# Patient Record
Sex: Male | Born: 1935 | Race: White | Hispanic: No | Marital: Married | Smoking: Former smoker
Health system: Southern US, Community
[De-identification: ages and names within clinical notes are randomized; demographics above are authoritative.]

## PROBLEM LIST (undated history)

## (undated) DIAGNOSIS — E785 Hyperlipidemia, unspecified: Secondary | ICD-10-CM

## (undated) DIAGNOSIS — J690 Pneumonitis due to inhalation of food and vomit: Secondary | ICD-10-CM

## (undated) DIAGNOSIS — S069X9A Unspecified intracranial injury with loss of consciousness of unspecified duration, initial encounter: Secondary | ICD-10-CM

## (undated) DIAGNOSIS — K56609 Unspecified intestinal obstruction, unspecified as to partial versus complete obstruction: Secondary | ICD-10-CM

## (undated) DIAGNOSIS — J189 Pneumonia, unspecified organism: Secondary | ICD-10-CM

## (undated) DIAGNOSIS — K219 Gastro-esophageal reflux disease without esophagitis: Secondary | ICD-10-CM

## (undated) DIAGNOSIS — R413 Other amnesia: Secondary | ICD-10-CM

## (undated) DIAGNOSIS — M159 Polyosteoarthritis, unspecified: Secondary | ICD-10-CM

## (undated) DIAGNOSIS — M1990 Unspecified osteoarthritis, unspecified site: Secondary | ICD-10-CM

## (undated) DIAGNOSIS — M542 Cervicalgia: Secondary | ICD-10-CM

## (undated) DIAGNOSIS — G8929 Other chronic pain: Secondary | ICD-10-CM

## (undated) DIAGNOSIS — G919 Hydrocephalus, unspecified: Secondary | ICD-10-CM

## (undated) DIAGNOSIS — Q046 Congenital cerebral cysts: Secondary | ICD-10-CM

## (undated) DIAGNOSIS — I10 Essential (primary) hypertension: Secondary | ICD-10-CM

## (undated) DIAGNOSIS — I639 Cerebral infarction, unspecified: Secondary | ICD-10-CM

## (undated) DIAGNOSIS — C4490 Unspecified malignant neoplasm of skin, unspecified: Secondary | ICD-10-CM

## (undated) DIAGNOSIS — C61 Malignant neoplasm of prostate: Secondary | ICD-10-CM

## (undated) HISTORY — DX: Essential (primary) hypertension: I10

## (undated) HISTORY — DX: Hyperlipidemia, unspecified: E78.5

## (undated) HISTORY — PX: TRANSURETHRAL RESECTION OF PROSTATE: SHX73

## (undated) HISTORY — DX: Cerebral infarction, unspecified: I63.9

## (undated) HISTORY — DX: Polyosteoarthritis, unspecified: M15.9

---

## 1989-03-04 DIAGNOSIS — S069X9A Unspecified intracranial injury with loss of consciousness of unspecified duration, initial encounter: Secondary | ICD-10-CM

## 1989-03-04 DIAGNOSIS — S069XAA Unspecified intracranial injury with loss of consciousness status unknown, initial encounter: Secondary | ICD-10-CM

## 1989-03-04 HISTORY — DX: Unspecified intracranial injury with loss of consciousness of unspecified duration, initial encounter: S06.9X9A

## 1989-03-04 HISTORY — PX: BRAIN SURGERY: SHX531

## 1989-03-04 HISTORY — DX: Unspecified intracranial injury with loss of consciousness status unknown, initial encounter: S06.9XAA

## 1996-05-04 HISTORY — PX: PROSTATECTOMY: SHX69

## 1997-05-04 HISTORY — PX: INGUINAL HERNIA REPAIR: SUR1180

## 1997-05-04 HISTORY — PX: ABDOMINAL HERNIA REPAIR: SHX539

## 2004-11-01 ENCOUNTER — Emergency Department (HOSPITAL_COMMUNITY): Admission: EM | Admit: 2004-11-01 | Discharge: 2004-11-01 | Payer: Self-pay | Admitting: Emergency Medicine

## 2004-12-19 ENCOUNTER — Inpatient Hospital Stay (HOSPITAL_COMMUNITY): Admission: EM | Admit: 2004-12-19 | Discharge: 2004-12-23 | Payer: Self-pay | Admitting: Emergency Medicine

## 2005-01-29 ENCOUNTER — Encounter: Admission: RE | Admit: 2005-01-29 | Discharge: 2005-01-29 | Payer: Self-pay | Admitting: Gastroenterology

## 2006-01-03 ENCOUNTER — Emergency Department (HOSPITAL_COMMUNITY): Admission: EM | Admit: 2006-01-03 | Discharge: 2006-01-03 | Payer: Self-pay | Admitting: Emergency Medicine

## 2006-06-05 ENCOUNTER — Inpatient Hospital Stay (HOSPITAL_COMMUNITY): Admission: EM | Admit: 2006-06-05 | Discharge: 2006-06-06 | Payer: Self-pay | Admitting: Emergency Medicine

## 2006-11-29 ENCOUNTER — Ambulatory Visit (HOSPITAL_COMMUNITY): Admission: RE | Admit: 2006-11-29 | Discharge: 2006-11-29 | Payer: Self-pay | Admitting: Gastroenterology

## 2006-11-29 ENCOUNTER — Encounter (INDEPENDENT_AMBULATORY_CARE_PROVIDER_SITE_OTHER): Payer: Self-pay | Admitting: Gastroenterology

## 2007-12-13 ENCOUNTER — Inpatient Hospital Stay (HOSPITAL_COMMUNITY): Admission: EM | Admit: 2007-12-13 | Discharge: 2007-12-17 | Payer: Self-pay | Admitting: Emergency Medicine

## 2007-12-29 ENCOUNTER — Encounter: Admission: RE | Admit: 2007-12-29 | Discharge: 2007-12-29 | Payer: Self-pay | Admitting: Gastroenterology

## 2008-07-18 DIAGNOSIS — K219 Gastro-esophageal reflux disease without esophagitis: Secondary | ICD-10-CM | POA: Insufficient documentation

## 2008-07-18 DIAGNOSIS — Z8546 Personal history of malignant neoplasm of prostate: Secondary | ICD-10-CM | POA: Insufficient documentation

## 2008-07-20 ENCOUNTER — Ambulatory Visit: Payer: Self-pay | Admitting: Family Medicine

## 2008-07-20 DIAGNOSIS — Z860100 Personal history of colon polyps, unspecified: Secondary | ICD-10-CM | POA: Insufficient documentation

## 2008-07-20 DIAGNOSIS — J309 Allergic rhinitis, unspecified: Secondary | ICD-10-CM | POA: Insufficient documentation

## 2008-07-20 DIAGNOSIS — E785 Hyperlipidemia, unspecified: Secondary | ICD-10-CM | POA: Insufficient documentation

## 2008-07-20 DIAGNOSIS — I10 Essential (primary) hypertension: Secondary | ICD-10-CM | POA: Insufficient documentation

## 2008-07-20 DIAGNOSIS — F329 Major depressive disorder, single episode, unspecified: Secondary | ICD-10-CM | POA: Insufficient documentation

## 2008-07-23 LAB — CONVERTED CEMR LAB
ALT: 16 units/L (ref 0–53)
AST: 16 units/L (ref 0–37)
Albumin: 3.9 g/dL (ref 3.5–5.2)
Alkaline Phosphatase: 81 units/L (ref 39–117)
BUN: 24 mg/dL — ABNORMAL HIGH (ref 6–23)
Bilirubin, Direct: 0.1 mg/dL (ref 0.0–0.3)
CO2: 26 meq/L (ref 19–32)
Calcium: 9.2 mg/dL (ref 8.4–10.5)
Chloride: 105 meq/L (ref 96–112)
Cholesterol: 153 mg/dL (ref 0–200)
Creatinine, Ser: 1.1 mg/dL (ref 0.4–1.5)
GFR calc non Af Amer: 69.23 mL/min (ref >60)
Glucose, Bld: 68 mg/dL — ABNORMAL LOW (ref 70–99)
HDL: 35.8 mg/dL — ABNORMAL LOW (ref >39.00)
LDL Cholesterol: 95 mg/dL (ref 0–99)
Potassium: 4.5 meq/L (ref 3.5–5.1)
Sodium: 140 meq/L (ref 135–145)
Total Bilirubin: 0.8 mg/dL (ref 0.3–1.2)
Total CHOL/HDL Ratio: 4
Total Protein: 6.7 g/dL (ref 6.0–8.3)
Triglycerides: 112 mg/dL (ref 0.0–149.0)
VLDL: 22.4 mg/dL (ref 0.0–40.0)

## 2008-12-17 ENCOUNTER — Emergency Department (HOSPITAL_COMMUNITY): Admission: EM | Admit: 2008-12-17 | Discharge: 2008-12-17 | Payer: Self-pay | Admitting: Emergency Medicine

## 2009-04-04 ENCOUNTER — Ambulatory Visit: Payer: Self-pay | Admitting: Family Medicine

## 2009-07-31 ENCOUNTER — Telehealth: Payer: Self-pay | Admitting: Family Medicine

## 2009-08-01 ENCOUNTER — Encounter: Payer: Self-pay | Admitting: Family Medicine

## 2009-08-02 ENCOUNTER — Ambulatory Visit: Payer: Self-pay | Admitting: Family Medicine

## 2009-08-02 LAB — CONVERTED CEMR LAB
Cholesterol, target level: 200 mg/dL
HDL goal, serum: 40 mg/dL
LDL Goal: 130 mg/dL

## 2009-08-07 ENCOUNTER — Encounter: Payer: Self-pay | Admitting: Family Medicine

## 2009-08-21 ENCOUNTER — Ambulatory Visit: Payer: Self-pay | Admitting: Family Medicine

## 2009-08-26 LAB — CONVERTED CEMR LAB
Cholesterol: 155 mg/dL (ref 0–200)
HDL: 41.2 mg/dL (ref >39.00)
LDL Cholesterol: 97 mg/dL (ref 0–99)
PSA: 0.01 ng/mL — ABNORMAL LOW (ref 0.10–4.00)
Total CHOL/HDL Ratio: 4
Triglycerides: 84 mg/dL (ref 0.0–149.0)
VLDL: 16.8 mg/dL (ref 0.0–40.0)

## 2009-10-10 ENCOUNTER — Ambulatory Visit: Payer: Self-pay | Admitting: Family Medicine

## 2009-10-10 DIAGNOSIS — D485 Neoplasm of uncertain behavior of skin: Secondary | ICD-10-CM | POA: Insufficient documentation

## 2009-10-10 DIAGNOSIS — IMO0002 Reserved for concepts with insufficient information to code with codable children: Secondary | ICD-10-CM | POA: Insufficient documentation

## 2009-10-10 DIAGNOSIS — R439 Unspecified disturbances of smell and taste: Secondary | ICD-10-CM | POA: Insufficient documentation

## 2009-10-24 ENCOUNTER — Ambulatory Visit: Payer: Self-pay | Admitting: Family Medicine

## 2009-10-24 DIAGNOSIS — L989 Disorder of the skin and subcutaneous tissue, unspecified: Secondary | ICD-10-CM | POA: Insufficient documentation

## 2009-10-28 DIAGNOSIS — C4490 Unspecified malignant neoplasm of skin, unspecified: Secondary | ICD-10-CM

## 2009-10-28 HISTORY — DX: Unspecified malignant neoplasm of skin, unspecified: C44.90

## 2009-11-01 ENCOUNTER — Encounter: Payer: Self-pay | Admitting: Family Medicine

## 2009-11-05 ENCOUNTER — Encounter: Payer: Self-pay | Admitting: Family Medicine

## 2009-12-24 ENCOUNTER — Encounter: Payer: Self-pay | Admitting: Family Medicine

## 2010-03-06 ENCOUNTER — Ambulatory Visit: Payer: Self-pay | Admitting: Family Medicine

## 2010-04-01 ENCOUNTER — Ambulatory Visit: Payer: Self-pay | Admitting: Family Medicine

## 2010-04-01 DIAGNOSIS — R413 Other amnesia: Secondary | ICD-10-CM | POA: Insufficient documentation

## 2010-04-02 LAB — CONVERTED CEMR LAB
ALT: 21 units/L (ref 0–53)
AST: 24 units/L (ref 0–37)
Albumin: 4 g/dL (ref 3.5–5.2)
Alkaline Phosphatase: 91 units/L (ref 39–117)
Bilirubin, Direct: 0.2 mg/dL (ref 0.0–0.3)
Cholesterol: 173 mg/dL (ref 0–200)
HDL: 36.5 mg/dL — ABNORMAL LOW (ref >39.00)
LDL Cholesterol: 112 mg/dL — ABNORMAL HIGH (ref 0–99)
TSH: 2.05 microintl units/mL (ref 0.35–5.50)
Total Bilirubin: 0.6 mg/dL (ref 0.3–1.2)
Total CHOL/HDL Ratio: 5
Total Protein: 6.3 g/dL (ref 6.0–8.3)
Triglycerides: 125 mg/dL (ref 0.0–149.0)
VLDL: 25 mg/dL (ref 0.0–40.0)
Vitamin B-12: 544 pg/mL (ref 211–911)

## 2010-06-03 NOTE — Procedures (Signed)
Summary: EGD with Biopsy/[facility]  EGD with Biopsy/[facility]   Imported By: Maryln Gottron 08/09/2009 11:23:21  _____________________________________________________________________  External Attachment:    Type:   Image     Comment:   External Document

## 2010-06-03 NOTE — Assessment & Plan Note (Signed)
Summary: FLU SHOT // RS/wife rescd//ccm  Nurse Visit   Allergies: 1)  ! Penicillin V Potassium (Penicillin V Potassium)  Orders Added: 1)  Flu Vaccine 78yrs + MEDICARE PATIENTS [Q2039] 2)  Administration Flu vaccine - MCR [G0008]        Flu Vaccine Consent Questions     Do you have a history of severe allergic reactions to this vaccine? no    Any prior history of allergic reactions to egg and/or gelatin? no    Do you have a sensitivity to the preservative Thimersol? no    Do you have a past history of Guillan-Barre Syndrome? no    Do you currently have an acute febrile illness? no    Have you ever had a severe reaction to latex? no    Vaccine information given and explained to patient? yes    Are you currently pregnant? no    Lot Number:[id number]   Exp Date:11/01/2010   Site Given  Left Deltoid IM

## 2010-06-03 NOTE — Assessment & Plan Note (Signed)
Summary: MED REFILL/CB   Vital Signs:  Patient profile:   77 year old male Weight:      195 pounds Temp:     97.6 degrees F oral BP sitting:   122 / 70  (left arm) Cuff size:   regular  Vitals Entered By: Sid Falcon LPN (August 02, 1608 1:35 PM) CC: Med check and follow-up, Hypertension Management, Lipid Management   History of Present Illness: Patient is seen for followup multiple medical problems. He has history of hyperlipidemia, depression, hypertension, GERD, and prostate cancer.  No PSA in over one year. No obstructive symptoms. Compliant with all medications. Potential diarrhea related to Zegerid. Gastroenterologist is following him regarding that. Scheduled EGD. Occasional breakthrough heartburn symptoms.  Depression is stable on sertraline. Sleeping fairly well. Denies depressed mood.  Hypertension History:      He denies headache, chest pain, palpitations, dyspnea with exertion, orthopnea, PND, peripheral edema, visual symptoms, neurologic problems, syncope, and side effects from treatment.        Positive major cardiovascular risk factors include male age 53 years old or older, hyperlipidemia, and hypertension.  Negative major cardiovascular risk factors include negative family history for ischemic heart disease and non-tobacco-user status.        Further assessment for target organ damage reveals no history of ASHD, stroke/TIA, or peripheral vascular disease.    Lipid Management History:      Positive NCEP/ATP III risk factors include male age 35 years old or older, HDL cholesterol less than 40, and hypertension.  Negative NCEP/ATP III risk factors include no family history for ischemic heart disease, non-tobacco-user status, no ASHD (atherosclerotic heart disease), no prior stroke/TIA, no peripheral vascular disease, and no history of aortic aneurysm.      Allergies: 1)  ! Penicillin V Potassium (Penicillin V Potassium)  Past History:  Past Medical History: Last  updated: 07/20/2008 Colonic polyps, hx of GERD Hyperlipidemia Hypertension Fainting spells Ulcers Hay Fever/Allergies Short term memory loss  Social History: Last updated: 07/20/2008 Retired Married Alcohol use-no Smoker, not currently PMH reviewed for relevance  Review of Systems  The patient denies anorexia, fever, weight loss, weight gain, hoarseness, chest pain, syncope, dyspnea on exertion, peripheral edema, prolonged cough, headaches, hemoptysis, melena, hematochezia, hematuria, incontinence, muscle weakness, and depression.    Physical Exam  General:  Well-developed,well-nourished,in no acute distress; alert,appropriate and cooperative throughout examination Ears:  External ear exam shows no significant lesions or deformities.  Otoscopic examination reveals clear canals, tympanic membranes are intact bilaterally without bulging, retraction, inflammation or discharge. Hearing is grossly normal bilaterally. Mouth:  Oral mucosa and oropharynx without lesions or exudates.  Teeth in good repair. Neck:  No deformities, masses, or tenderness noted. Lungs:  Normal respiratory effort, chest expands symmetrically. Lungs are clear to auscultation, no crackles or wheezes. Heart:  normal rate and regular rhythm.   Extremities:  No clubbing, cyanosis, edema, or deformity noted with normal full range of motion of all joints.     Impression & Recommendations:  Problem # 1:  HYPERTENSION (ICD-401.9)  His updated medication list for this problem includes:    Avapro 75 Mg Tabs (Irbesartan) ..... Once daily  Problem # 2:  HYPERLIPIDEMIA (ICD-272.4)  His updated medication list for this problem includes:    Lipitor 20 Mg Tabs (Atorvastatin calcium) ..... Once daily  Problem # 3:  DEPRESSION (ICD-311) Assessment: Unchanged  His updated medication list for this problem includes:    Sertraline Hcl 50 Mg Tabs (Sertraline hcl) ..... Once  daily  Problem # 4:  PROSTATE CANCER, HX OF  (ICD-V10.46) recheck PSA.  Complete Medication List: 1)  Lipitor 20 Mg Tabs (Atorvastatin calcium) .... Once daily 2)  Zegerid 40-1100 Mg Caps (Omeprazole-sodium bicarbonate) .... One tab two times a day 3)  Avapro 75 Mg Tabs (Irbesartan) .... Once daily 4)  Sertraline Hcl 50 Mg Tabs (Sertraline hcl) .... Once daily 5)  Fexofenadine Hcl 60 Mg Tabs (Fexofenadine hcl) .... Two times a day as needed  Hypertension Assessment/Plan:      The patient's hypertensive risk group is category B: At least one risk factor (excluding diabetes) with no target organ damage.  His calculated 10 year risk of coronary heart disease is 11 %.  Today's blood pressure is 122/70.    Lipid Assessment/Plan:      Based on NCEP/ATP III, the patient's risk factor category is "0-1 risk factors".  The patient's lipid goals are as follows: Total cholesterol goal is 200; LDL cholesterol goal is 130; HDL cholesterol goal is 40; Triglyceride goal is 150.    Patient Instructions: 1)  Return for the following lab work: 2)  Lipid panel  272.4 3)  PSA  V10.46

## 2010-06-03 NOTE — Letter (Signed)
Summary: [facility]  [facility]   Imported By: Maryln Gottron 08/09/2009 10:25:52  _____________________________________________________________________  External Attachment:    Type:   Image     Comment:   External Document
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
## 2010-06-03 NOTE — Assessment & Plan Note (Signed)
Summary: skin biopsy/njr   Vital Signs:  Patient profile:   77 year old male Weight:      195 pounds Temp:     97.8 degrees F oral BP sitting:   132 / 80  (left arm) Cuff size:   regular  Vitals Entered By: Duard Brady LPN (October 24, 2009 3:17 PM) CC: (R) forearm and scalp  possible skin sores to be removed Is Patient Diabetic? No   History of Present Illness: here for scalp lesion excision.  Painful with brushing. Some growth in size this year.  Also hx R forearm lesion which we had planned to bx but since recent antibiotics this seems to be drying up and resolving.  Allergies: 1)  ! Penicillin V Potassium (Penicillin V Potassium)  Past History:  Past Medical History: Last updated: 07/20/2008 Colonic polyps, hx of GERD Hyperlipidemia Hypertension Fainting spells Ulcers Hay Fever/Allergies Short term memory loss  Physical Exam  General:  Well-developed,well-nourished,in no acute distress; alert,appropriate and cooperative throughout examination Head:  L parieto occ area hyperkeratotic slightly raised nonpigmented lesion which is 8mm in length. Skin:  R forearm small eschar without nodules.   Impression & Recommendations:  Problem # 1:  SKIN LESION (ICD-709.9)  Scalp.  Suspect benign hyperkeratotic.  Discussed risks and benefits ok shave excision and pt consented.  prepped skin and anest with 1% plain xylo and shaved off with #15 blade.  Minimal bleeding controlled with silver nitrate.  Topical ab applied.  Orders: Shave Skin Lesion 0.6-1.0cm scalp/neck/hands/feet/genitalia (11914)  Complete Medication List: 1)  Lipitor 20 Mg Tabs (Atorvastatin calcium) .... Once daily 2)  Zegerid 40-1100 Mg Caps (Omeprazole-sodium bicarbonate) .... One tab two times a day 3)  Avapro 75 Mg Tabs (Irbesartan) .... Once daily 4)  Sertraline Hcl 50 Mg Tabs (Sertraline hcl) .... Once daily 5)  Fexofenadine Hcl 60 Mg Tabs (Fexofenadine hcl) .... Two times a day as needed 6)   Cephalexin 500 Mg Caps (Cephalexin) .... One by mouth three times a day for 10 days  Patient Instructions: 1)  Keep dry for 24 hours then clean with soap and water. 2)  Apply topical antibiotic for 3 to 4 days.
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
## 2010-06-03 NOTE — Letter (Signed)
Summary: [facility]   Imported By: Maryln Gottron 01/03/2010 13:16:07  _____________________________________________________________________  External Attachment:    Type:   Image     Comment:   External Document

## 2010-06-03 NOTE — Consult Note (Signed)
Summary: [facility]  [facility]   Imported By: Maryln Gottron 11/11/2009 15:37:54  _____________________________________________________________________  External Attachment:    Type:   Image     Comment:   External Document

## 2010-06-03 NOTE — Progress Notes (Signed)
Summary: Avapro  Omeprazole , Zolft, Lipitor to [facility] X 1 year  Phone Note Call from Patient Call back at Home Phone [phone]   Caller: spouse- Erskine Squibb Reason for Call: Privacy/Consent Authorization Summary of Call: Pt is req refills of Avapro 75mg , Omeprazole/Sodium Bicarb Caps 40/1100mg  and Zolfort 50mg , Lipitor 20mg  for Eric George # [phone]. Initial call taken by: Lucy Antigua,  July 31, 2009 10:01 AM  Follow-up for Phone Call        Pt wife informed pt needs annual OV.  Appt scheduled for appt on Friday.  Will refill meds X 1 year as requested Follow-up by: Sid Falcon LPN,  July 31, 2009 3:12 PM    Prescriptions: AVAPRO 75 MG TABS (IRBESARTAN) once daily  #90 x 3   Entered by:   Sid Falcon LPN   Authorized by:   Evelena Peat MD   Signed by:   Sid Falcon LPN on 30/86/5784   Method used:   Electronically to        [facility]* (mail-order)             ,          Ph: [phone]       Fax: [phone]   RxID:   [id number] ZEGERID 40-1100 MG CAPS (OMEPRAZOLE-SODIUM BICARBONATE) one tab two times a day  #180 x 3   Entered by:   Sid Falcon LPN   Authorized by:   Evelena Peat MD   Signed by:   Sid Falcon LPN on 38/75/6433   Method used:   Electronically to        [facility]* (mail-order)             ,          Ph: [phone]       Fax: [phone]   RxID:   [id number] SERTRALINE HCL 50 MG TABS (SERTRALINE HCL) once daily  #90 x 3   Entered by:   Sid Falcon LPN   Authorized by:   Evelena Peat MD   Signed by:   Sid Falcon LPN on 62/37/6283   Method used:   Electronically to        [facility]* (mail-order)             ,          Ph: [phone]       Fax: [phone]   RxID:   [id number] LIPITOR 20 MG TABS (ATORVASTATIN CALCIUM) once daily  #90 x 3   Entered by:   Sid Falcon LPN   Authorized by:   Evelena Peat MD   Signed by:   Sid Falcon LPN on 93/71/6967   Method used:   Electronically  to        [facility]* (mail-order)             ,          Ph: [phone]       Fax: [phone]   RxID:   [id number]

## 2010-06-03 NOTE — Letter (Signed)
Summary: Mini-Mental Status Exam  Mini-Mental Status Exam   Imported By: Maryln Gottron 04/04/2010 09:57:32  _____________________________________________________________________  External Attachment:    Type:   Image     Comment:   External Document

## 2010-06-29 ENCOUNTER — Other Ambulatory Visit: Payer: Self-pay | Admitting: Family Medicine

## 2010-06-29 DIAGNOSIS — F32A Depression, unspecified: Secondary | ICD-10-CM

## 2010-06-29 DIAGNOSIS — F329 Major depressive disorder, single episode, unspecified: Secondary | ICD-10-CM

## 2010-06-29 DIAGNOSIS — E785 Hyperlipidemia, unspecified: Secondary | ICD-10-CM

## 2010-08-09 LAB — POCT CARDIAC MARKERS
CKMB, poc: <1 ng/mL — ABNORMAL LOW (ref 1.0–8.0)
Myoglobin, poc: 106 ng/mL (ref 12–200)
Troponin i, poc: <0.05 ng/mL (ref 0.00–0.09)

## 2010-08-09 LAB — CBC
HCT: 43.3 % (ref 39.0–52.0)
Hemoglobin: 14.8 g/dL (ref 13.0–17.0)
MCHC: 34.2 g/dL (ref 30.0–36.0)
MCV: 87.2 fL (ref 78.0–100.0)
Platelets: 224 10*3/uL (ref 150–400)
RBC: 4.97 MIL/uL (ref 4.22–5.81)
RDW: 13.7 % (ref 11.5–15.5)
WBC: 10 10*3/uL (ref 4.0–10.5)

## 2010-08-09 LAB — DIFFERENTIAL
Basophils Absolute: 0.1 10*3/uL (ref 0.0–0.1)
Basophils Relative: 1 % (ref 0–1)
Eosinophils Absolute: 0.2 10*3/uL (ref 0.0–0.7)
Eosinophils Relative: 2 % (ref 0–5)
Lymphocytes Relative: 9 % — ABNORMAL LOW (ref 12–46)
Lymphs Abs: 0.9 10*3/uL (ref 0.7–4.0)
Monocytes Absolute: 0.6 10*3/uL (ref 0.1–1.0)
Monocytes Relative: 6 % (ref 3–12)
Neutro Abs: 8.1 10*3/uL — ABNORMAL HIGH (ref 1.7–7.7)
Neutrophils Relative %: 82 % — ABNORMAL HIGH (ref 43–77)

## 2010-08-09 LAB — BASIC METABOLIC PANEL
BUN: 20 mg/dL (ref 6–23)
CO2: 22 mEq/L (ref 19–32)
Calcium: 9 mg/dL (ref 8.4–10.5)
Chloride: 108 mEq/L (ref 96–112)
Creatinine, Ser: 1.16 mg/dL (ref 0.4–1.5)
GFR calc Af Amer: >60 mL/min (ref >60)
GFR calc non Af Amer: >60 mL/min (ref >60)
Glucose, Bld: 116 mg/dL — ABNORMAL HIGH (ref 70–99)
Potassium: 3.9 mEq/L (ref 3.5–5.1)
Sodium: 135 mEq/L (ref 135–145)
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
## 2010-09-16 NOTE — Op Note (Signed)
Eric George, Eric George               ACCOUNT NO.:  [id number]   MEDICAL RECORD NO.:  [id number]          PATIENT TYPE:  AMB   LOCATION:  ENDO                         FACILITY:  [facility]   PHYSICIAN:  Anselmo Rod, M.D.  DATE OF BIRTH:  30-Apr-1931   DATE OF PROCEDURE:  11/29/2006  DATE OF DISCHARGE:                               OPERATIVE REPORT   PROCEDURE PERFORMED:  Colonoscopy with cold biopsies X 2.   ENDOSCOPIST:  Charna Elizabeth, M.D.   INSTRUMENT USED:  Pentax video colonoscope.   INDICATION FOR PROCEDURE:  A 77 year old white male undergoing  colonoscopy.  The patient has had rectal bleeding in the recent past.  Rule out colonic polyps, masses, etc.  The patient has a history of  tubular adenomas removed in the past.   PREPROCEDURE PREPARATION:  Informed consent was procured from the  patient.  The patient fasted for 8 hours prior to procedure and prepped  with 2 Dulcolax pills, a bottle of magnesium citrate and a gallon of  NuLYTELY the night prior to procedure.  The risks and benefits of the  procedure, including a 10% miss rate of cancer and polyp, were discussed  with the patient as well.   PREPROCEDURE PHYSICAL:  Patient had stable vital signs.  NECK:  Supple.  CHEST:  Clear to auscultation.  S1, S2 regular.  ABDOMEN:  Soft with normal bowel sounds.   DESCRIPTION OF THE PROCEDURE:  The patient was placed in the left  lateral decubitus position, sedated with 50 mg of Fentanyl and 5 mg of  Versed given intravenously in slow incremental doses. Once the patient  was adequately sedated and maintained on low flow oxygen and continuous  cardiac monitoring, the Pentax video colonoscope was advanced from the  rectum to the cecum.  The appendiceal orifice and ileocecal valve were  visualized after multiple washings  A small sessile polyp was biopsied  from the mid right colon (cold biopsies x2). A few early scattered  diverticula were noticed, especially on the left side.   Retroflexion of  the rectum revealed prominent internal hemorrhoids.  The terminal ileum  appeared healthy without lesions.  The appendiceal orifice and ileocecal  valve were clearly visualized and photographed.   IMPRESSION:  1. Prominent internal hemorrhoids seen on retroflexion.  2. A few early scattered diverticula especially in the sigmoid colon.  3. Small sessile polyp biopsied from the mid right colon.  4. Normal appearing cecum, ileocecal valve and terminal ileum.  5. Significant amount of residual stool in the colon, multiple      washings done, small lesions could be missed.  Colonoscopy to the terminal ileum except for small internal hemorrhoids.  No masses, polyps or diverticula seen.   RECOMMENDATIONS:  1. Await pathology results.  2. Repeat colonoscopy in the next 5 years or earlier if need be.  3. Continue on high fiber diet with liberal fluid intake.  4. Avoid all nonsteroidals including aspirin for the next 2 weeks.  5. Outpatient followup in the next 2 weeks for further      recommendations.  Anselmo Rod, M.D.  Electronically Signed     JNM/MEDQ  D:  11/29/2006  T:  11/29/2006  Job:  [id number]   cc:   Evelena Peat, M.D.

## 2010-09-19 NOTE — Discharge Summary (Signed)
NAMEAKSHATH, MCCAREY               ACCOUNT NO.:  [id number]   MEDICAL RECORD NO.:  [id number]          PATIENT TYPE:  INP   LOCATION:  6703                         FACILITY:  [facility]   PHYSICIAN:  Theodosia Paling, MD    DATE OF BIRTH:  [birth year]   DATE OF ADMISSION:  12/13/2007  DATE OF DISCHARGE:  12/17/2007                               DISCHARGE SUMMARY   DISCHARGE DIAGNOSES:  1. Small bowel obstruction.  2. Hypertension.  3. Depression.   DISCHARGE MEDICATIONS:  1. Home medications to be continued.  2. Detrol LA 4 mg p.o. daily.  3. Lipitor 20 mg p.o. daily.  4. Zoloft 50 mg p.o. daily.  5. Avapro 75 mg p.o. daily.   ADDITIONAL MEDICATIONS:  Omeprazole 20 mg p.o. daily.   ADMITTING NOTE:  Please refer to the admission note of admitting  history.  Please refer to the admission note of Dr. Marthann Schiller,  dictated on December 13, 2007, at 1451.   HOSPITAL COURSE:  Following issues were addressed during the  hospitalization:  1. Small bowel obstruction.  The patient received NG tube.  IV      hydration and antiemetics, and his small bowel obstruction clearly      resolved.  The patient is to follow his GI physician for further      evaluation and management as an outpatient.  2. Hypertension.   CURRENT MEDICATIONS:  1. Home medications were continued.  2. Depression home medications were continued.   DISPOSITION:  The patient is going to follow up with the primary care  physician in 1 week's time, and the patient will follow with Dr. Anselmo Rod, who is a GI specialist, in 1 week's time for further  evaluation of recurrent small bowel obstruction.   IMAGING MODALITY PERFORMED:  X-ray of abdomen, which showed bowel gas  pattern consistent with partial small bowel obstruction.  The repeat x-  ray on December 16, 2007, showed pattern of small bowel obstruction seems  to be improving.   PROCEDURE PERFORMED:  None.   DISPOSITION:  1. She needs to follow up with  Dr. Evelena Peat in 1 week's time.  2. The patient is to follow with Dr. Anselmo Rod in 1 week's time.   Total time spent on discharge is 30 minutes.      Theodosia Paling, MD  Electronically Signed     NP/MEDQ  D:  01/12/2008  T:  01/13/2008  Job:  [id number]   cc:   Evelena Peat, M.D.  [address]
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
## 2010-09-19 NOTE — H&P (Signed)
NAMEZAY, YEARGAN               ACCOUNT NO.:  [id number]   MEDICAL RECORD NO.:  [id number]          PATIENT TYPE:  INP   LOCATION:  5531                         FACILITY:  [facility]   PHYSICIAN:  Theresia Bough, MD       DATE OF BIRTH:  1930/08/24   DATE OF ADMISSION:  06/05/2006  DATE OF DISCHARGE:  06/06/2006                              HISTORY & PHYSICAL   PRIMARY CARE PHYSICIAN:  Dr. Marcelle Overlie.   PRESENTING COMPLAINT:  Chest pain.   HISTORY OF PRESENT ILLNESS:  This is a 77 year old white male patient  who had chest pain for about a few minutes this morning.  This happened  around 10 a.m.  The patient came to the hospital around 11 to 12 this  morning.  Patient has been free of chest pain since she has been in the  hospital.  No nausea, no vomiting.  He denies cough, no shortness of  breath.  No wheezing.  No headaches.  No dizziness.  No dysuria.  No  joint pains and no feet swelling.   PAST MEDICAL HISTORY:  Includes:  1. GERD.  2. A past history of high cholesterol.  3. A past history of urge incontinence.   SOCIAL HISTORY:  The patient lives with his wife.  He quit smoking  several years back.  No drug use.  No alcohol use.   FAMILY HISTORY:  Not contributory.   HOME MEDICATIONS:  Include:  1. Omeprazole 40 mg twice daily.  2. Lipitor 10 mg once daily.  3. Detrol 4 mg daily.  4. Carafate 1 mg as needed.   ALLERGIES:  He has ALLERGIES TO PENICILLIN.   PAST SURGERY:  He had a radical prostatectomy about 7-9 years ago.  Patient has been free of prostate cancer since that time.  The patient  also had a stress test about 3-5 years ago.  The test was not  significant, according to the wife.   PHYSICAL EXAMINATION:  VITAL SIGNS:  Blood pressure of 138/80, pulse  rate of 61, respiratory rate of 22, temperature of 98.0.  HEAD/NECK:  Shows pink conjunctivae.  He has no jaundice.  His neck his  supple.  His mucous membrane is moist.  CHEST:  Moves with respiration.   Auscultation show clear breath sounds.  No wheezes, no crackles.  CARDIOVASCULAR:  Normal heart sounds.  No murmur and no gallop.  Pulses  are palpable in his limbs.  ABDOMEN:  Soft, no tenderness, no masses palpable.  The patient has  normal bowel sounds.  EXTREMITIES:  Shows no edema.  SKIN:  No skin changes.  CENTRAL NERVOUS SYSTEMS.  The patient is alert and oriented x3.  Power  is 4/4 in all limbs.  His speech is clear.   EKG was done which shows a sinus rhythm with PACs.  Blood work shows a  sodium of 138, potassium 4.2, chloride of 109, bicarb of 21, glucose of  92, hemoglobin of 16, hematocrit of 48.  Creatinine is 1.0.  CK-MB is  less than 1.  Troponin-I is less than 0.05.  WBC is 10.6.  Platelets is  248.  Chest x-ray is negative.   ASSESSMENT:  1. Chest pain, rule out coronary artery disease.  The patient has a      history of high blood pressure but currently not on medications.      He also has a history of high cholesterol, on Lipitor.  His risk      factors for coronary artery disease include his age, history of      high cholesterol, and a history of blood pressure at this time      which is diet controlled.  I will admit to telemetry for a stress      echo.  2. A history of gastroesophageal reflux disease and high cholesterol.      Plan for that is to continue his previous home medications.      Theresia Bough, MD  Electronically Signed     GA/MEDQ  D:  06/05/2006  T:  06/06/2006  Job:  [id number]

## 2010-09-30 ENCOUNTER — Ambulatory Visit (INDEPENDENT_AMBULATORY_CARE_PROVIDER_SITE_OTHER): Payer: Medicare Other | Admitting: Family Medicine

## 2010-09-30 ENCOUNTER — Encounter: Payer: Self-pay | Admitting: Family Medicine

## 2010-09-30 DIAGNOSIS — E785 Hyperlipidemia, unspecified: Secondary | ICD-10-CM

## 2010-09-30 DIAGNOSIS — I10 Essential (primary) hypertension: Secondary | ICD-10-CM

## 2010-09-30 DIAGNOSIS — S91209A Unspecified open wound of unspecified toe(s) with damage to nail, initial encounter: Secondary | ICD-10-CM

## 2010-09-30 DIAGNOSIS — S91109A Unspecified open wound of unspecified toe(s) without damage to nail, initial encounter: Secondary | ICD-10-CM

## 2010-09-30 DIAGNOSIS — L989 Disorder of the skin and subcutaneous tissue, unspecified: Secondary | ICD-10-CM

## 2010-09-30 NOTE — Progress Notes (Signed)
  Subjective:    Patient ID: Eric George, male    DOB: [birth year], 77 y.o.   MRN: [id number]  HPI Patient seen for medical followup. He has medical problems including history of hyperlipidemia, depression, hypertension, GERD, and history of prostate cancer. Medications reviewed. Compliant with all. Depression is stable. Blood pressure stable on Avapro 75 mg daily. Lipitor 20 mg daily. No myalgias. Lipids were stable when checked last fall.  Had injury left great toenail several months ago. Toenails basically lifted off but not fully detached and requesting this be removed today. Catching on socks. No pain. No signs of secondary infection.  Other new problem is right anterior chest wall skin lesion. Present for several months. Is not sure exactly how long. No bleeding or itching. Growing fairly rapidly in size.   Review of Systems  Constitutional: Negative for fever, activity change, appetite change, fatigue and unexpected weight change.  Respiratory: Negative for cough, shortness of breath and wheezing.   Cardiovascular: Negative for chest pain, palpitations and leg swelling.  Gastrointestinal: Negative for abdominal pain.  Genitourinary: Negative for dysuria.  Neurological: Negative for dizziness and headaches.  Hematological: Negative for adenopathy. Does not bruise/bleed easily.       Objective:   Physical Exam  Constitutional: He is oriented to person, place, and time. He appears well-developed and well-nourished. No distress.  HENT:  Right Ear: External ear normal.  Left Ear: External ear normal.  Mouth/Throat: Oropharynx is clear and moist.  Eyes: Pupils are equal, round, and reactive to light.  Neck: Neck supple. No thyromegaly present.  Cardiovascular: Normal rate, regular rhythm and normal heart sounds.   Pulmonary/Chest: Effort normal and breath sounds normal. No respiratory distress. He has no wheezes. He has no rales.  Musculoskeletal: He exhibits no edema.       Left  great toe reveals toenail is essentially detached with the exception of one quarter. Using surgical scissors we removed the attached portion of nail.    Lymphadenopathy:    He has no cervical adenopathy.  Neurological: He is alert and oriented to person, place, and time.  Skin:       Right upper anterior chest wall reveals irregular nodular skin lesion approximately three-quarter centimeter diameter. This appears to blanch somewhat with pressure          Assessment & Plan:  #1 skin lesion right anterior chest wall. Questionable angiomatous lesion though unusually large for this. Recommend excision and patient will return #2 left great toenail avulsion. Remove remainder of nail without difficulty #3 hypertension stable continue Avapro 75 mg daily #4 hyperlipidemia. Continue atorvastatin 20 mg daily. Recheck lipids 6 months

## 2010-10-16 ENCOUNTER — Encounter: Payer: Self-pay | Admitting: Family Medicine

## 2010-10-16 ENCOUNTER — Ambulatory Visit (INDEPENDENT_AMBULATORY_CARE_PROVIDER_SITE_OTHER): Payer: Medicare Other | Admitting: Family Medicine

## 2010-10-16 ENCOUNTER — Other Ambulatory Visit: Payer: Self-pay | Admitting: Family Medicine

## 2010-10-16 DIAGNOSIS — L989 Disorder of the skin and subcutaneous tissue, unspecified: Secondary | ICD-10-CM

## 2010-10-16 DIAGNOSIS — M542 Cervicalgia: Secondary | ICD-10-CM

## 2010-10-16 NOTE — Patient Instructions (Signed)
Keep wound dry for the first 24 hours then clean daily with soap and water for one week. Apply topical antibiotic daily for 3-4 days. Keep covered with clean dressing for 4-5 days. Follow up promptly for any signs of infection such as redness, warmth, pain, or drainage.  

## 2010-10-16 NOTE — Progress Notes (Addendum)
  Subjective:    Patient ID: Eric George, male    DOB: 1930/08/08, 77 y.o.   MRN: [id number]  HPI Here for 2 items.  Sharp fleeting pain left side of neck. Very intermittent and rare. Symptoms last a few seconds. Exacerbated by turning neck. No radiculopathy symptoms. No weakness or numbness. No progressive symptoms. Denies headache.  Nodular lesion right upper chest wall. Noted recently and possibly growing. No itching or bleeding. Prior history squamous cell carcinoma skin   Review of Systems  Cardiovascular: Negative for chest pain.  Neurological: Negative for weakness, numbness and headaches.       Objective:   Physical Exam  Constitutional: He is oriented to person, place, and time. He appears well-developed and well-nourished. No distress.  Neck: Neck supple. No thyromegaly present.  Cardiovascular: Normal rate and regular rhythm.   Pulmonary/Chest: Effort normal and breath sounds normal. No respiratory distress. He has no wheezes. He has no rales.  Lymphadenopathy:    He has no cervical adenopathy.  Neurological: He is alert and oriented to person, place, and time.       Full-strength upper extremities. Symmetric reflexes. No muscle atrophy.  Skin:       Nodular skin lesion right upper chest wall. Well-demarcated border. No ulceration. Blanches slightly with pressure. No significant pigmentary change.          Assessment & Plan:  #1 fleeting left neck pain. Suspect cervical nerve impingement. No progressive symptoms. Cervical spine films if symptoms persist or worsen #2 skin lesion right upper chest wall. Question angiofibroma. Discussed risks and benefits of shave excision patient consents. Prepped with Betadine. Anesthesia 1% Xylocaine with epinephrine. Shave with #15 blade minimal bleeding controlled with Drysol.  dressing is applied. Wound care instruction given. Specimen sent to pathology  Skin lesion 7 mm diameter.

## 2010-11-26 ENCOUNTER — Ambulatory Visit (INDEPENDENT_AMBULATORY_CARE_PROVIDER_SITE_OTHER): Payer: Medicare Other | Admitting: Family Medicine

## 2010-11-26 ENCOUNTER — Encounter: Payer: Self-pay | Admitting: Family Medicine

## 2010-11-26 VITALS — BP 118/70 | Temp 97.8°F | Wt 195.0 lb

## 2010-11-26 DIAGNOSIS — T148XXA Other injury of unspecified body region, initial encounter: Secondary | ICD-10-CM

## 2010-11-26 NOTE — Patient Instructions (Signed)
Follow up if you have any further bruising

## 2010-11-26 NOTE — Progress Notes (Signed)
  Subjective:    Patient ID: GEORG ANG, male    DOB: 04-Oct-1930, 77 y.o.   MRN: [id number]  HPI Bruising noted right lower lip just yesterday. No history of injury. No aspirin use. No other bleeding complications. No bleeding from gums. No mouth pain.  Hypertension treated Avapro 75 mg daily. Blood pressures well controlled. No dizziness. History of chronic GERD issues on Zegerid one twice daily per gastroenterology. Does have some chronic loose stools occasionally which are unchanged. No bloody stools.   Review of Systems  Constitutional: Negative for fever, chills, appetite change and unexpected weight change.  Respiratory: Negative for cough and shortness of breath.   Cardiovascular: Negative for chest pain.  Gastrointestinal: Negative for abdominal pain and blood in stool.  Neurological: Negative for dizziness and headaches.  Hematological: Negative for adenopathy. Does not bruise/bleed easily.       Objective:   Physical Exam  Constitutional: He appears well-developed and well-nourished.  HENT:  Right Ear: External ear normal.  Left Ear: External ear normal.       Patient has some ecchymosis right lower lip but no swelling. No lesions noted. No induration. Oropharyngeal exam otherwise normal.  Eyes: Pupils are equal, round, and reactive to light.  Neck: Neck supple.  Cardiovascular: Normal rate and regular rhythm.   Pulmonary/Chest: Effort normal and breath sounds normal. No respiratory distress. He has no wheezes. He has no rales.  Musculoskeletal: He exhibits no edema.  Lymphadenopathy:    He has no cervical adenopathy.          Assessment & Plan:  Ecchymosis right lower lip. No reported injury. No evidence for generalized bleeding or bruising. Reassurance given. Followup if he has any other areas of bleeding or bruising

## 2011-01-30 LAB — TSH: TSH: 2.077

## 2011-01-30 LAB — CBC
HCT: 39
HCT: 40.4
HCT: 46.8
Hemoglobin: 13.1
Hemoglobin: 13.7
Hemoglobin: 15.6
MCHC: 33.2
MCHC: 33.6
MCHC: 33.9
MCV: 85.7
MCV: 86.4
MCV: 87.2
Platelets: 183
Platelets: 197
Platelets: 228
RBC: 4.52
RBC: 4.72
RBC: 5.37
RDW: 14
RDW: 14.1
RDW: 14.2
WBC: 13 — ABNORMAL HIGH
WBC: 6.8
WBC: 7

## 2011-01-30 LAB — URINALYSIS, ROUTINE W REFLEX MICROSCOPIC
Bilirubin Urine: NEGATIVE
Glucose, UA: NEGATIVE
Hgb urine dipstick: NEGATIVE
Ketones, ur: NEGATIVE
Nitrite: NEGATIVE
Protein, ur: NEGATIVE
Specific Gravity, Urine: 1.028
Urobilinogen, UA: 0.2
pH: 5.5

## 2011-01-30 LAB — DIFFERENTIAL
Basophils Absolute: 0
Basophils Absolute: 0
Basophils Relative: 0
Basophils Relative: 0
Eosinophils Absolute: 0.1
Eosinophils Absolute: 0.2
Eosinophils Relative: 1
Eosinophils Relative: 3
Lymphocytes Relative: 10 — ABNORMAL LOW
Lymphocytes Relative: 18
Lymphs Abs: 1.2
Lymphs Abs: 1.3
Monocytes Absolute: 0.6
Monocytes Absolute: 0.6
Monocytes Relative: 5
Monocytes Relative: 9
Neutro Abs: 11 — ABNORMAL HIGH
Neutro Abs: 4.9
Neutrophils Relative %: 70
Neutrophils Relative %: 85 — ABNORMAL HIGH

## 2011-01-30 LAB — CLOSTRIDIUM DIFFICILE EIA
C difficile Toxins A+B, EIA: NEGATIVE
C difficile Toxins A+B, EIA: NEGATIVE
C difficile Toxins A+B, EIA: NEGATIVE

## 2011-01-30 LAB — COMPREHENSIVE METABOLIC PANEL
ALT: 17
AST: 18
Albumin: 3.8
Alkaline Phosphatase: 88
BUN: 21
CO2: 20
Calcium: 9.4
Chloride: 111
Creatinine, Ser: 1.07
GFR calc Af Amer: >60
GFR calc non Af Amer: >60
Glucose, Bld: 124 — ABNORMAL HIGH
Potassium: 4.1
Sodium: 139
Total Bilirubin: 0.8
Total Protein: 6.5

## 2011-01-30 LAB — BASIC METABOLIC PANEL
BUN: 18
BUN: 18
CO2: 18 — ABNORMAL LOW
CO2: 19
Calcium: 8.4
Calcium: 8.7
Chloride: 114 — ABNORMAL HIGH
Chloride: 115 — ABNORMAL HIGH
Creatinine, Ser: 1.04
Creatinine, Ser: 1.11
GFR calc Af Amer: >60
GFR calc Af Amer: >60
GFR calc non Af Amer: >60
GFR calc non Af Amer: >60
Glucose, Bld: 68 — ABNORMAL LOW
Glucose, Bld: 90
Potassium: 4.2
Potassium: 4.4
Sodium: 140
Sodium: 141

## 2011-01-30 LAB — B-NATRIURETIC PEPTIDE (CONVERTED LAB): Pro B Natriuretic peptide (BNP): 77

## 2011-01-30 LAB — GLUCOSE, CAPILLARY: Glucose-Capillary: 229 — ABNORMAL HIGH

## 2011-01-30 LAB — CALCIUM: Calcium: 8.7

## 2011-01-30 LAB — MAGNESIUM: Magnesium: 2.3

## 2011-01-30 LAB — PHOSPHORUS: Phosphorus: 3.5

## 2011-02-16 ENCOUNTER — Ambulatory Visit (INDEPENDENT_AMBULATORY_CARE_PROVIDER_SITE_OTHER): Payer: Medicare Other

## 2011-02-16 DIAGNOSIS — Z23 Encounter for immunization: Secondary | ICD-10-CM

## 2011-04-12 ENCOUNTER — Other Ambulatory Visit: Payer: Self-pay | Admitting: Family Medicine

## 2011-04-13 ENCOUNTER — Other Ambulatory Visit: Payer: Self-pay | Admitting: Family Medicine

## 2011-04-13 MED ORDER — IRBESARTAN 75 MG PO TABS: 75.0000 mg | ORAL_TABLET | Freq: Every day | ORAL | Status: DC

## 2011-04-13 MED ORDER — ATORVASTATIN CALCIUM 20 MG PO TABS: 20.0000 mg | ORAL_TABLET | Freq: Every day | ORAL | Status: DC

## 2011-04-22 ENCOUNTER — Ambulatory Visit (INDEPENDENT_AMBULATORY_CARE_PROVIDER_SITE_OTHER): Payer: Medicare Other | Admitting: Family Medicine

## 2011-04-22 ENCOUNTER — Encounter: Payer: Self-pay | Admitting: Family Medicine

## 2011-04-22 VITALS — BP 128/70 | HR 72 | Temp 97.4°F | Resp 12 | Ht 73.5 in | Wt 197.0 lb

## 2011-04-22 DIAGNOSIS — E785 Hyperlipidemia, unspecified: Secondary | ICD-10-CM

## 2011-04-22 DIAGNOSIS — Z8546 Personal history of malignant neoplasm of prostate: Secondary | ICD-10-CM

## 2011-04-22 DIAGNOSIS — Z0000 Encounter for general adult medical examination without abnormal findings: Secondary | ICD-10-CM

## 2011-04-22 DIAGNOSIS — I10 Essential (primary) hypertension: Secondary | ICD-10-CM

## 2011-04-22 DIAGNOSIS — F329 Major depressive disorder, single episode, unspecified: Secondary | ICD-10-CM

## 2011-04-22 LAB — HEPATIC FUNCTION PANEL
ALT: 17 U/L (ref 0–53)
AST: 19 U/L (ref 0–37)
Albumin: 4.2 g/dL (ref 3.5–5.2)
Alkaline Phosphatase: 89 U/L (ref 39–117)
Bilirubin, Direct: 0.1 mg/dL (ref 0.0–0.3)
Total Bilirubin: 0.5 mg/dL (ref 0.3–1.2)
Total Protein: 6.6 g/dL (ref 6.0–8.3)

## 2011-04-22 LAB — LIPID PANEL
Cholesterol: 165 mg/dL (ref 0–200)
HDL: 38.1 mg/dL — ABNORMAL LOW (ref >39.00)
LDL Cholesterol: 99 mg/dL (ref 0–99)
Total CHOL/HDL Ratio: 4
Triglycerides: 142 mg/dL (ref 0.0–149.0)
VLDL: 28.4 mg/dL (ref 0.0–40.0)

## 2011-04-22 LAB — BASIC METABOLIC PANEL
BUN: 21 mg/dL (ref 6–23)
CO2: 28 mEq/L (ref 19–32)
Calcium: 9.6 mg/dL (ref 8.4–10.5)
Chloride: 107 mEq/L (ref 96–112)
Creatinine, Ser: 1.2 mg/dL (ref 0.4–1.5)
GFR: 60.99 mL/min (ref >60.00)
Glucose, Bld: 111 mg/dL — ABNORMAL HIGH (ref 70–99)
Potassium: 5.5 mEq/L — ABNORMAL HIGH (ref 3.5–5.1)
Sodium: 143 mEq/L (ref 135–145)

## 2011-04-22 LAB — PSA: PSA: 0 ng/mL — ABNORMAL LOW (ref 0.10–4.00)
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
## 2011-04-23 NOTE — Progress Notes (Signed)
Quick Note:  Pt wife informed ______ 

## 2011-05-21 ENCOUNTER — Telehealth: Payer: Self-pay | Admitting: Family Medicine

## 2011-05-21 MED ORDER — ACYCLOVIR 5 % EX OINT: TOPICAL_OINTMENT | CUTANEOUS | Status: DC

## 2011-05-21 NOTE — Telephone Encounter (Signed)
They refill Zovirax ointment to use as directed for fever blisters

## 2011-05-21 NOTE — Telephone Encounter (Signed)
Pt has cold sore requesting zovirax ointment 5%. [facility] [phone].Pt had old rx for med

## 2011-05-21 NOTE — Telephone Encounter (Signed)
Pt wife informed

## 2011-05-21 NOTE — Telephone Encounter (Signed)
I called pt wife re: her study.  She reports her husband gets fever blisters rarely, zorivax was last filled 7 years ago with another provider, so outdated.  Please add to med list and refill

## 2011-08-06 DIAGNOSIS — K648 Other hemorrhoids: Secondary | ICD-10-CM | POA: Diagnosis not present

## 2011-08-06 DIAGNOSIS — K219 Gastro-esophageal reflux disease without esophagitis: Secondary | ICD-10-CM | POA: Diagnosis not present

## 2011-08-06 DIAGNOSIS — Z86010 Personal history of colonic polyps: Secondary | ICD-10-CM | POA: Diagnosis not present

## 2011-08-18 DIAGNOSIS — H2510 Age-related nuclear cataract, unspecified eye: Secondary | ICD-10-CM | POA: Diagnosis not present

## 2011-09-12 ENCOUNTER — Inpatient Hospital Stay (HOSPITAL_COMMUNITY)
Admission: EM | Admit: 2011-09-12 | Discharge: 2011-09-15 | DRG: 390 | Disposition: A | Discharge status: Home / Self Care | Payer: Medicare Other | Attending: Internal Medicine | Admitting: Internal Medicine

## 2011-09-12 ENCOUNTER — Emergency Department (HOSPITAL_COMMUNITY): Payer: Medicare Other

## 2011-09-12 ENCOUNTER — Encounter (HOSPITAL_COMMUNITY): Payer: Self-pay | Admitting: *Deleted

## 2011-09-12 DIAGNOSIS — R5381 Other malaise: Secondary | ICD-10-CM | POA: Diagnosis not present

## 2011-09-12 DIAGNOSIS — R141 Gas pain: Secondary | ICD-10-CM | POA: Diagnosis not present

## 2011-09-12 DIAGNOSIS — R5383 Other fatigue: Secondary | ICD-10-CM | POA: Diagnosis not present

## 2011-09-12 DIAGNOSIS — R112 Nausea with vomiting, unspecified: Secondary | ICD-10-CM | POA: Diagnosis present

## 2011-09-12 DIAGNOSIS — R55 Syncope and collapse: Secondary | ICD-10-CM | POA: Diagnosis not present

## 2011-09-12 DIAGNOSIS — Z87820 Personal history of traumatic brain injury: Secondary | ICD-10-CM

## 2011-09-12 DIAGNOSIS — R109 Unspecified abdominal pain: Secondary | ICD-10-CM | POA: Diagnosis not present

## 2011-09-12 DIAGNOSIS — Z87891 Personal history of nicotine dependence: Secondary | ICD-10-CM

## 2011-09-12 DIAGNOSIS — Z79899 Other long term (current) drug therapy: Secondary | ICD-10-CM | POA: Diagnosis not present

## 2011-09-12 DIAGNOSIS — I10 Essential (primary) hypertension: Secondary | ICD-10-CM | POA: Diagnosis present

## 2011-09-12 DIAGNOSIS — K56609 Unspecified intestinal obstruction, unspecified as to partial versus complete obstruction: Secondary | ICD-10-CM | POA: Diagnosis not present

## 2011-09-12 DIAGNOSIS — K5900 Constipation, unspecified: Secondary | ICD-10-CM | POA: Diagnosis not present

## 2011-09-12 DIAGNOSIS — R1084 Generalized abdominal pain: Secondary | ICD-10-CM | POA: Diagnosis not present

## 2011-09-12 DIAGNOSIS — E785 Hyperlipidemia, unspecified: Secondary | ICD-10-CM | POA: Diagnosis not present

## 2011-09-12 DIAGNOSIS — R143 Flatulence: Secondary | ICD-10-CM | POA: Diagnosis not present

## 2011-09-12 DIAGNOSIS — R404 Transient alteration of awareness: Secondary | ICD-10-CM | POA: Diagnosis not present

## 2011-09-12 DIAGNOSIS — R61 Generalized hyperhidrosis: Secondary | ICD-10-CM | POA: Diagnosis not present

## 2011-09-12 HISTORY — DX: Hydrocephalus, unspecified: G91.9

## 2011-09-12 HISTORY — DX: Unspecified intracranial injury with loss of consciousness of unspecified duration, initial encounter: S06.9X9A

## 2011-09-12 HISTORY — DX: Congenital cerebral cysts: Q04.6

## 2011-09-12 HISTORY — DX: Gastro-esophageal reflux disease without esophagitis: K21.9

## 2011-09-12 LAB — DIFFERENTIAL
Basophils Absolute: 0 10*3/uL (ref 0.0–0.1)
Basophils Relative: 0 % (ref 0–1)
Eosinophils Absolute: 0.1 10*3/uL (ref 0.0–0.7)
Eosinophils Relative: 1 % (ref 0–5)
Lymphocytes Relative: 7 % — ABNORMAL LOW (ref 12–46)
Lymphs Abs: 1 10*3/uL (ref 0.7–4.0)
Monocytes Absolute: 1.1 10*3/uL — ABNORMAL HIGH (ref 0.1–1.0)
Monocytes Relative: 7 % (ref 3–12)
Neutro Abs: 13.6 10*3/uL — ABNORMAL HIGH (ref 1.7–7.7)
Neutrophils Relative %: 86 % — ABNORMAL HIGH (ref 43–77)

## 2011-09-12 LAB — COMPREHENSIVE METABOLIC PANEL
ALT: 11 U/L (ref 0–53)
AST: 16 U/L (ref 0–37)
Albumin: 3.8 g/dL (ref 3.5–5.2)
Alkaline Phosphatase: 90 U/L (ref 39–117)
BUN: 22 mg/dL (ref 6–23)
CO2: 24 mEq/L (ref 19–32)
Calcium: 9.4 mg/dL (ref 8.4–10.5)
Chloride: 101 mEq/L (ref 96–112)
Creatinine, Ser: 1.15 mg/dL (ref 0.50–1.35)
GFR calc Af Amer: 68 mL/min — ABNORMAL LOW (ref >90)
GFR calc non Af Amer: 59 mL/min — ABNORMAL LOW (ref >90)
Glucose, Bld: 130 mg/dL — ABNORMAL HIGH (ref 70–99)
Potassium: 4 mEq/L (ref 3.5–5.1)
Sodium: 135 mEq/L (ref 135–145)
Total Bilirubin: 0.4 mg/dL (ref 0.3–1.2)
Total Protein: 6.5 g/dL (ref 6.0–8.3)

## 2011-09-12 LAB — CBC
HCT: 43.2 % (ref 39.0–52.0)
Hemoglobin: 15.3 g/dL (ref 13.0–17.0)
MCH: 29.1 pg (ref 26.0–34.0)
MCHC: 35.4 g/dL (ref 30.0–36.0)
MCV: 82.3 fL (ref 78.0–100.0)
Platelets: 202 10*3/uL (ref 150–400)
RBC: 5.25 MIL/uL (ref 4.22–5.81)
RDW: 13.4 % (ref 11.5–15.5)
WBC: 15.8 10*3/uL — ABNORMAL HIGH (ref 4.0–10.5)

## 2011-09-12 LAB — LIPASE, BLOOD: Lipase: 27 U/L (ref 11–59)

## 2011-09-12 MED ORDER — ONDANSETRON HCL 4 MG PO TABS: 4.0000 mg | ORAL_TABLET | Freq: Four times a day (QID) | ORAL | Status: DC | PRN

## 2011-09-12 MED ORDER — ACETAMINOPHEN 650 MG RE SUPP: 650.0000 mg | Freq: Four times a day (QID) | RECTAL | Status: DC | PRN

## 2011-09-12 MED ORDER — ENOXAPARIN SODIUM 40 MG/0.4ML ~~LOC~~ SOLN
40.0000 mg | SUBCUTANEOUS | Status: DC
  Administered 2011-09-12 – 2011-09-14 (×3): 40 mg via SUBCUTANEOUS
  Filled 2011-09-12 (×4): qty 0.4

## 2011-09-12 MED ORDER — SODIUM CHLORIDE 0.9 % IV BOLUS (SEPSIS)
1000.0000 mL | INTRAVENOUS | Status: AC
  Administered 2011-09-12: 1000 mL via INTRAVENOUS

## 2011-09-12 MED ORDER — ZOLPIDEM TARTRATE 5 MG PO TABS: 5.0000 mg | ORAL_TABLET | Freq: Every evening | ORAL | Status: DC | PRN

## 2011-09-12 MED ORDER — ACETAMINOPHEN 325 MG PO TABS: 650.0000 mg | ORAL_TABLET | Freq: Four times a day (QID) | ORAL | Status: DC | PRN

## 2011-09-12 MED ORDER — OXYCODONE HCL 5 MG PO TABS: 5.0000 mg | ORAL_TABLET | ORAL | Status: DC | PRN

## 2011-09-12 MED ORDER — ONDANSETRON HCL 4 MG/2ML IJ SOLN: 4.0000 mg | Freq: Four times a day (QID) | INTRAMUSCULAR | Status: DC | PRN

## 2011-09-12 MED ORDER — SODIUM CHLORIDE 0.9 % IV SOLN
INTRAVENOUS | Status: DC
  Administered 2011-09-13 (×2): via INTRAVENOUS

## 2011-09-12 MED ORDER — ONDANSETRON HCL 4 MG/2ML IJ SOLN
INTRAMUSCULAR | Status: AC
  Administered 2011-09-12: 19:00:00
  Filled 2011-09-12: qty 2

## 2011-09-12 MED ORDER — HYDROMORPHONE HCL PF 1 MG/ML IJ SOLN: 0.5000 mg | INTRAMUSCULAR | Status: DC | PRN

## 2011-09-12 MED ORDER — ONDANSETRON HCL 4 MG/2ML IJ SOLN
4.0000 mg | Freq: Once | INTRAMUSCULAR | Status: AC
  Administered 2011-09-12: 4 mg via INTRAVENOUS
  Filled 2011-09-12: qty 2

## 2011-09-12 NOTE — ED Notes (Signed)
Dr. Lovell Sheehan at bedside to assess pt

## 2011-09-12 NOTE — ED Notes (Signed)
Attempted to call report x 1.  Given name and number and nurse to call back.  

## 2011-09-12 NOTE — ED Notes (Signed)
Pt complains of nausea and syncope starting about 1 hour ago.  Has a history of Bowel Obstruction x 3 and hes afraid this is the same.   Pt given 4mg  of Zofran and 500cc NS fluid Bolus by EMS pta.  Pt alert and oriented on arrival

## 2011-09-12 NOTE — ED Notes (Signed)
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  Hx of hydrocephalus, no recent CT scans

## 2011-09-12 NOTE — ED Provider Notes (Signed)
History     CSN: [id number]  Arrival date & time 09/12/11  1821   None     Chief Complaint  Patient presents with  . Nausea    (Consider location/radiation/quality/duration/timing/severity/associated sxs/prior treatment) Patient is a 78 y.o. male presenting with vomiting. The history is provided by the patient.  Emesis  This is a new problem. The current episode started 3 to 5 hours ago. The problem occurs 2 to 4 times per day. The problem has been resolved. The emesis has an appearance of stomach contents. There has been no fever. Pertinent negatives include no abdominal pain, no cough, no diarrhea, no fever and no headaches.    Past Medical History  Diagnosis Date  . Bowel obstruction   . Hydrocephalus   . TBI (traumatic brain injury)   . Colloid cyst of brain   . GERD (gastroesophageal reflux disease)     Past Surgical History  Procedure Date  . Hernia repair   . Prostatectomy   . Brain surgery     History reviewed. No pertinent family history.  History  Substance Use Topics  . Smoking status: Former Smoker -- 0.5 packs/day for 15 years    Types: Cigarettes    Quit date: 09/30/1966  . Smokeless tobacco: Not on file  . Alcohol Use: No      Review of Systems  Constitutional: Negative for fever.  HENT: Negative for rhinorrhea, drooling and neck pain.   Eyes: Negative for pain.  Respiratory: Negative for cough and shortness of breath.   Cardiovascular: Negative for chest pain and leg swelling.  Gastrointestinal: Positive for vomiting. Negative for nausea, abdominal pain and diarrhea.  Genitourinary: Negative for dysuria and hematuria.  Musculoskeletal: Negative for gait problem.  Skin: Negative for color change.  Neurological: Negative for numbness and headaches.       Pre-syncope  Hematological: Negative for adenopathy.  Psychiatric/Behavioral: Negative for behavioral problems.  All other systems reviewed and are negative.    Allergies   Penicillins  Home Medications   No current outpatient prescriptions on file.  BP 136/82  Pulse 65  Temp(Src) 98.1 F (36.7 C) (Oral)  Resp 20  Ht 6' (1.829 m)  Wt 200 lb 11.2 oz (91.037 kg)  BMI 27.22 kg/m2  SpO2 94%  Physical Exam  Constitutional: He is oriented to person, place, and time. He appears well-developed and well-nourished.  HENT:  Head: Normocephalic and atraumatic.  Right Ear: External ear normal.  Left Ear: External ear normal.  Nose: Nose normal.  Mouth/Throat: Oropharynx is clear and moist. No oropharyngeal exudate.  Eyes: Conjunctivae and EOM are normal. Pupils are equal, round, and reactive to light.  Neck: Normal range of motion. Neck supple.  Cardiovascular: Normal rate, regular rhythm, normal heart sounds and intact distal pulses.  Exam reveals no gallop and no friction rub.   No murmur heard. Pulmonary/Chest: Effort normal and breath sounds normal. No respiratory distress. He has no wheezes.  Abdominal: Soft. Bowel sounds are normal. He exhibits distension (mild). There is no tenderness.  Musculoskeletal: Normal range of motion. He exhibits no edema and no tenderness.  Neurological: He is alert and oriented to person, place, and time.  Skin: Skin is warm and dry.  Psychiatric: He has a normal mood and affect. His behavior is normal.    ED Course  Procedures (including critical care time)  Labs Reviewed  CBC - Abnormal; Notable for the following:    WBC 15.8 (*)    All other components within  normal limits  DIFFERENTIAL - Abnormal; Notable for the following:    Neutrophils Relative 86 (*)    Neutro Abs 13.6 (*)    Lymphocytes Relative 7 (*)    Monocytes Absolute 1.1 (*)    All other components within normal limits  COMPREHENSIVE METABOLIC PANEL - Abnormal; Notable for the following:    Glucose, Bld 130 (*)    GFR calc non Af Amer 59 (*)    GFR calc Af Amer 68 (*)    All other components within normal limits  LIPASE, BLOOD  BASIC METABOLIC  PANEL  CBC   Dg Abd Acute W/chest  09/12/2011  *RADIOLOGY REPORT*  Clinical Data: Nausea and vomiting.  History of bowel obstruction.  ACUTE ABDOMEN SERIES (ABDOMEN 2 VIEW & CHEST 1 VIEW)  Comparison: Chest and two views abdomen 12/13/2007.  Findings: Single view of the chest demonstrates clear lungs and normal heart size.  No pneumothorax or pleural effusion.  Two views of the abdomen show dilated loops of small bowel measuring up to 4.8 cm with air-fluid levels.  No free intraperitoneal air is identified.  Small amount of gas is seen in the colon.  IMPRESSION: Study is positive for small bowel obstruction.  Original Report Authenticated By: Bernadene Bell. D'ALESSIO, M.D.     1. Bowel obstruction       MDM  12:39 AM 78 y.o. male w hx of bowel obst x3, HTN, HLP pw abdominal distension and vomiting that began today while watching golf. Pt has had hard stool this am.  Also had presyncopal event while stooling. He denies fever, cp, sob. Pt AFVSS here, appears well on exam, abd mildly distended, but no ttp. Pt's sx cw previous bowel obst's. Will get labs, IVF, and AAS.  AAS cw sbo. Consulted hospitalist for admission.   Clinical Impression 1. Bowel obstruction            Purvis Sheffield, MD 09/13/11 [phone]

## 2011-09-12 NOTE — H&P (Signed)
DATE OF ADMISSION:  09/12/2011  PCP:    Kristian Covey, MD, MD   Chief Complaint: Nausea and Vomiting   HPI: Eric George is an 78 y.o. male who began to have an episode of lightheadness and diaphoresis followed by sudden onset of nausea and vomiting at 4 PM.  He became more uncomfortable and later had ABD Pain.  The discomfort was familiar and reminded him of his 3 previous episodes of Small bowel Obstruction.  IN the ED he was evaluated and on the Acute ABD Series he was found to have a Small Bowel Obstruction.  He was referred for Medical admission.  In the past he has not had to have surgical intervention and with conservative therapy his condition would improve.     Past Medical History  Diagnosis Date  . Bowel obstruction   . Hydrocephalus   . TBI (traumatic brain injury)   . Colloid cyst of brain     Past Surgical History  Procedure Date  . Hernia repair   . Prostatectomy     Medications:  HOME MEDS: Prior to Admission medications   Medication Sig Start Date End Date Taking? Authorizing Provider  atorvastatin (LIPITOR) 20 MG tablet Take 20 mg by mouth daily. 04/13/11  Yes Kristian Covey, MD  irbesartan (AVAPRO) 75 MG tablet Take 75 mg by mouth daily. 04/13/11  Yes Kristian Covey, MD  omeprazole-sodium bicarbonate (ZEGERID) 40-1100 MG per capsule Take 1 capsule by mouth 2 (two) times daily.     Yes Historical Provider, MD  Probiotic Product (ALIGN) 4 MG CAPS Take 1 capsule by mouth daily.   Yes Historical Provider, MD  sertraline (ZOLOFT) 50 MG tablet Take 50 mg by mouth daily.   Yes Historical Provider, MD    Allergies:  Allergies  Allergen Reactions  . Penicillins Rash    Social History:   reports that he quit smoking about 44 years ago. His smoking use included Cigarettes. He has a 7.5 pack-year smoking history. He does not have any smokeless tobacco history on file. His alcohol and drug histories not on file.  Family History: History reviewed. No  pertinent family history.  Review of Systems:  The patient denies anorexia, fever, weight loss, vision loss, decreased hearing, hoarseness, chest pain, syncope, dyspnea on exertion, peripheral edema, balance deficits, hemoptysis, melena, hematochezia, severe indigestion/heartburn, hematuria, incontinence, genital sores, muscle weakness, suspicious skin lesions, transient blindness, difficulty walking, depression, unusual weight change, abnormal bleeding, enlarged lymph nodes, angioedema, and breast masses.   Physical Exam:  GEN:  Pleasant 78 year old well nourished and well developed Caucasian male examined  and in discomfort but no acute distress; cooperative with exam Filed Vitals:   09/12/11 1831 09/12/11 1900 09/12/11 2000 09/12/11 2130  BP: 133/72 145/78 136/76 145/97  Pulse:  73 66 71  Temp: 97.8 F (36.6 C)     TempSrc: Oral     Resp: 30 25 22 21   SpO2: 96% 93% 97% 94%   Blood pressure 145/97, pulse 71, temperature 97.8 F (36.6 C), temperature source Oral, resp. rate 21, SpO2 94.00%. PSYCH: He is alert and oriented x4; does not appear anxious does not appear depressed; affect is normal HEENT: Normocephalic and Atraumatic, Mucous membranes pink; PERRLA; EOM intact; Fundi:  Benign;  No scleral icterus, Nares: Patent, Oropharynx: Clear, Fair Dentition, Neck:  FROM, no cervical lymphadenopathy nor thyromegaly or carotid bruit; no JVD; Breasts:: Not examined CHEST WALL: No tenderness CHEST: Normal respiration, clear to auscultation bilaterally HEART: Regular  rate and rhythm; no murmurs rubs or gallops BACK: No kyphosis or scoliosis; no CVA tenderness ABDOMEN:  Decreased Bowel Sounds, Obese, soft non-tender; no masses, no organomegaly, no pannus; no intertriginous candida. Rectal Exam: Not done EXTREMITIES: No bone or joint deformity; age-appropriate arthropathy of the hands and knees; no cyanosis, clubbing or edema; no ulcerations. Genitalia: not examined PULSES: 2+ and  symmetric SKIN: Normal hydration no rash or ulceration CNS: Cranial nerves 2-12 grossly intact no focal neurologic deficit except    Labs & Imaging Results for orders placed during the hospital encounter of 09/12/11 (from the past 48 hour(s))  CBC     Status: Abnormal   Collection Time   09/12/11  7:59 PM      Component Value Range Comment   WBC 15.8 (*) 4.0 - 10.5 (K/uL)    RBC 5.25  4.22 - 5.81 (MIL/uL)    Hemoglobin 15.3  13.0 - 17.0 (g/dL)    HCT 16.1  09.6 - 04.5 (%)    MCV 82.3  78.0 - 100.0 (fL)    MCH 29.1  26.0 - 34.0 (pg)    MCHC 35.4  30.0 - 36.0 (g/dL)    RDW 40.9  81.1 - 91.4 (%)    Platelets 202  150 - 400 (K/uL)   DIFFERENTIAL     Status: Abnormal   Collection Time   09/12/11  7:59 PM      Component Value Range Comment   Neutrophils Relative 86 (*) 43 - 77 (%)    Neutro Abs 13.6 (*) 1.7 - 7.7 (K/uL)    Lymphocytes Relative 7 (*) 12 - 46 (%)    Lymphs Abs 1.0  0.7 - 4.0 (K/uL)    Monocytes Relative 7  3 - 12 (%)    Monocytes Absolute 1.1 (*) 0.1 - 1.0 (K/uL)    Eosinophils Relative 1  0 - 5 (%)    Eosinophils Absolute 0.1  0.0 - 0.7 (K/uL)    Basophils Relative 0  0 - 1 (%)    Basophils Absolute 0.0  0.0 - 0.1 (K/uL)   COMPREHENSIVE METABOLIC PANEL     Status: Abnormal   Collection Time   09/12/11  7:59 PM      Component Value Range Comment   Sodium 135  135 - 145 (mEq/L)    Potassium 4.0  3.5 - 5.1 (mEq/L)    Chloride 101  96 - 112 (mEq/L)    CO2 24  19 - 32 (mEq/L)    Glucose, Bld 130 (*) 70 - 99 (mg/dL)    BUN 22  6 - 23 (mg/dL)    Creatinine, Ser 7.82  0.50 - 1.35 (mg/dL)    Calcium 9.4  8.4 - 10.5 (mg/dL)    Total Protein 6.5  6.0 - 8.3 (g/dL)    Albumin 3.8  3.5 - 5.2 (g/dL)    AST 16  0 - 37 (U/L)    ALT 11  0 - 53 (U/L)    Alkaline Phosphatase 90  39 - 117 (U/L)    Total Bilirubin 0.4  0.3 - 1.2 (mg/dL)    GFR calc non Af Amer 59 (*) >90 (mL/min)    GFR calc Af Amer 68 (*) >90 (mL/min)   LIPASE, BLOOD     Status: Normal   Collection Time    09/12/11  7:59 PM      Component Value Range Comment   Lipase 27  11 - 59 (U/L)    Dg Abd Acute W/chest  09/12/2011  *RADIOLOGY REPORT*  Clinical Data: Nausea and vomiting.  History of bowel obstruction.  ACUTE ABDOMEN SERIES (ABDOMEN 2 VIEW & CHEST 1 VIEW)  Comparison: Chest and two views abdomen 12/13/2007.  Findings: Single view of the chest demonstrates clear lungs and normal heart size.  No pneumothorax or pleural effusion.  Two views of the abdomen show dilated loops of small bowel measuring up to 4.8 cm with air-fluid levels.  No free intraperitoneal air is identified.  Small amount of gas is seen in the colon.  IMPRESSION: Study is positive for small bowel obstruction.  Original Report Authenticated By: Bernadene Bell. Maricela Curet, M.D.      Assessment: Present on Admission:  .Small bowel obstruction .Nausea & vomiting .Abdominal pain .HYPERTENSION .HYPERLIPIDEMIA    Plan:    Admit to Med/Surg NPO X Ice Chips, surgical consult if Symptoms worsen or persists Anti-Emetics PRN Pain Control Reconcile Meds DVT prophylaxis Other plans as per orders.    CODE STATUS:      FULL CODE      Adriann Thau C 09/12/2011, 10:05 PM

## 2011-09-12 NOTE — ED Provider Notes (Signed)
78 year old male with history of bowel obstruction started having nausea and vomiting at about 4 PM today. He is also noted abdominal distention. He has been passing flatus and had a normal bowel movement this morning. Exam shows a moderately distended abdomen which is soft and nontender. Bowel sounds are diminished., X-rays show a classic picture of small bowel obstruction. He will need to be admitted for bowel rest.  ECG shows normal sinus rhythm with a rate of 70, no ectopy. Normal axis. Normal P wave. Normal QRS. Normal intervals. Normal ST and T waves. Impression: normal ECG.When compared to ECG of 8/11/200 and, no significant changes are seen.   Dione Booze, MD 09/12/11 2056

## 2011-09-13 ENCOUNTER — Inpatient Hospital Stay (HOSPITAL_COMMUNITY): Payer: Medicare Other

## 2011-09-13 DIAGNOSIS — R1084 Generalized abdominal pain: Secondary | ICD-10-CM

## 2011-09-13 DIAGNOSIS — I10 Essential (primary) hypertension: Secondary | ICD-10-CM

## 2011-09-13 DIAGNOSIS — K56609 Unspecified intestinal obstruction, unspecified as to partial versus complete obstruction: Secondary | ICD-10-CM

## 2011-09-13 DIAGNOSIS — R112 Nausea with vomiting, unspecified: Secondary | ICD-10-CM

## 2011-09-13 LAB — BASIC METABOLIC PANEL
BUN: 20 mg/dL (ref 6–23)
CO2: 23 mEq/L (ref 19–32)
Calcium: 8.6 mg/dL (ref 8.4–10.5)
Chloride: 105 mEq/L (ref 96–112)
Creatinine, Ser: 1.11 mg/dL (ref 0.50–1.35)
GFR calc Af Amer: 71 mL/min — ABNORMAL LOW (ref >90)
GFR calc non Af Amer: 62 mL/min — ABNORMAL LOW (ref >90)
Glucose, Bld: 117 mg/dL — ABNORMAL HIGH (ref 70–99)
Potassium: 4.5 mEq/L (ref 3.5–5.1)
Sodium: 138 mEq/L (ref 135–145)

## 2011-09-13 LAB — CBC
HCT: 41.5 % (ref 39.0–52.0)
Hemoglobin: 14.1 g/dL (ref 13.0–17.0)
MCH: 28.3 pg (ref 26.0–34.0)
MCHC: 34 g/dL (ref 30.0–36.0)
MCV: 83.3 fL (ref 78.0–100.0)
Platelets: 189 10*3/uL (ref 150–400)
RBC: 4.98 MIL/uL (ref 4.22–5.81)
RDW: 13.6 % (ref 11.5–15.5)
WBC: 10 10*3/uL (ref 4.0–10.5)

## 2011-09-13 MED ORDER — SIMVASTATIN 20 MG PO TABS
20.0000 mg | ORAL_TABLET | Freq: Every day | ORAL | Status: DC
  Administered 2011-09-13 – 2011-09-14 (×2): 20 mg via ORAL
  Filled 2011-09-13 (×3): qty 1

## 2011-09-13 MED ORDER — FLORA-Q PO CAPS
1.0000 | ORAL_CAPSULE | Freq: Every day | ORAL | Status: DC
  Administered 2011-09-14 – 2011-09-15 (×2): 1 via ORAL
  Filled 2011-09-13 (×2): qty 1

## 2011-09-13 MED ORDER — IRBESARTAN 75 MG PO TABS
75.0000 mg | ORAL_TABLET | Freq: Every day | ORAL | Status: DC
  Administered 2011-09-13 – 2011-09-15 (×3): 75 mg via ORAL
  Filled 2011-09-13 (×3): qty 1

## 2011-09-13 MED ORDER — SERTRALINE HCL 50 MG PO TABS
50.0000 mg | ORAL_TABLET | Freq: Every day | ORAL | Status: DC
  Administered 2011-09-13 – 2011-09-15 (×3): 50 mg via ORAL
  Filled 2011-09-13 (×3): qty 1

## 2011-09-13 MED ORDER — HYDRALAZINE HCL 20 MG/ML IJ SOLN
2.0000 mg | Freq: Four times a day (QID) | INTRAMUSCULAR | Status: DC | PRN
  Administered 2011-09-13: 2 mg via INTRAVENOUS
  Filled 2011-09-13 (×2): qty 0.1

## 2011-09-13 MED ORDER — ALIGN 4 MG PO CAPS: 1.0000 | ORAL_CAPSULE | Freq: Every day | ORAL | Status: DC

## 2011-09-13 NOTE — ED Provider Notes (Signed)
I saw and evaluated the patient, reviewed the resident's note and I agree with the findings and plan.   Allison Silva, MD 09/13/11 0107 

## 2011-09-13 NOTE — Progress Notes (Signed)
[facility] PROGRESS NOTE  GENEVA PALLAS AVW:[id number] DOB: 1930/07/24 DOA: 09/12/2011 PCP: Kristian Covey, MD, MD  Assessment/Plan: 1. SBO - recurrent, partial, patient passing flatus and BM. Recheck AXR, maybe advance diet if looks better  2. HTN - to resume home meds when po intake established 3. HL - to resume lipitor when po intake established  Code Status: full Family Communication: wife Erskine Squibb [phone] Disposition Plan: home  Lonia Blood, [place]  [facility] Pager [phone]  If 7PM-7AM, please contact night-coverage [email or web address] Password [email or web address] 09/13/2011, 10:12 AM   LOS: 1 day   Brief narrative: NAITHEN RIVENBURG is an 78 y.o. male who began to have an episode of lightheadness and diaphoresis followed by sudden onset of nausea and vomiting at 4 PM. He became more uncomfortable and later had ABD Pain. The discomfort was familiar and reminded him of his 3 previous episodes of Small bowel Obstruction. IN the ED he was evaluated and on the Acute ABD Series he was found to have a Small Bowel Obstruction. He was referred for Medical admission. In the past he has not had to have surgical intervention and with conservative therapy his condition would improve.      Consultants:  Procedures:    Antibiotics:    Subjective: Wants food  Objective: Filed Vitals:   09/12/11 2209 09/12/11 2238 09/13/11 0526 09/13/11 0953  BP: 150/96 136/82 150/77 155/83  Pulse: 72 65 84 74  Temp:  98.1 F (36.7 C) 98.5 F (36.9 C) 98.2 F (36.8 C)  TempSrc:  Oral Oral Axillary  Resp: 22 20 20 18   Height:  6' (1.829 m)    Weight:  91.037 kg (200 lb 11.2 oz)    SpO2: 95% 94% 93% 94%    Intake/Output Summary (Last 24 hours) at 09/13/11 1012 Last data filed at 09/13/11 0700  Gross per 24 hour  Intake  492.5 ml  Output      0 ml  Net  492.5 ml    Exam:   Alert an doriented   CVS: RRR  RS: CTAB  Abdomen : slight distension, NT, BS diminished    Data Reviewed: Basic Metabolic Panel:  Lab 09/13/11 6295 09/12/11 1959  NA 138 135  K 4.5 4.0  CL 105 101  CO2 23 24  GLUCOSE 117* 130*  BUN 20 22  CREATININE 1.11 1.15  CALCIUM 8.6 9.4  MG -- --  PHOS -- --   Liver Function Tests:  Lab 09/12/11 1959  AST 16  ALT 11  ALKPHOS 90  BILITOT 0.4  PROT 6.5  ALBUMIN 3.8    Lab 09/12/11 1959  LIPASE 27  AMYLASE --   No results found for this basename: AMMONIA:5 in the last 168 hours CBC:  Lab 09/13/11 0640 09/12/11 1959  WBC 10.0 15.8*  NEUTROABS -- 13.6*  HGB 14.1 15.3  HCT 41.5 43.2  MCV 83.3 82.3  PLT 189 202   Cardiac Enzymes: No results found for this basename: CKTOTAL:5,CKMB:5,CKMBINDEX:5,TROPONINI:5 in the last 168 hours BNP: No components found with this basename: POCBNP:5 CBG: No results found for this basename: GLUCAP:5 in the last 168 hours  No results found for this or any previous visit (from the past 240 hour(s)).   Studies: Dg Abd Acute W/chest  09/12/2011  *RADIOLOGY REPORT*  Clinical Data: Nausea and vomiting.  History of bowel obstruction.  ACUTE ABDOMEN SERIES (ABDOMEN 2 VIEW & CHEST 1 VIEW)  Comparison: Chest and two views abdomen 12/13/2007.  Findings:  Single view of the chest demonstrates clear lungs and normal heart size.  No pneumothorax or pleural effusion.  Two views of the abdomen show dilated loops of small bowel measuring up to 4.8 cm with air-fluid levels.  No free intraperitoneal air is identified.  Small amount of gas is seen in the colon.  IMPRESSION: Study is positive for small bowel obstruction.  Original Report Authenticated By: Bernadene Bell. D'ALESSIO, M.D.    Scheduled Meds:    . enoxaparin  40 mg Subcutaneous Q24H  . ondansetron      . ondansetron (ZOFRAN) IV  4 mg Intravenous Once  . sodium chloride  1,000 mL Intravenous STAT   Continuous Infusions:    . sodium chloride 75 mL/hr at 09/13/11 0026

## 2011-09-14 ENCOUNTER — Telehealth: Payer: Self-pay | Admitting: Family Medicine

## 2011-09-14 DIAGNOSIS — I10 Essential (primary) hypertension: Secondary | ICD-10-CM

## 2011-09-14 DIAGNOSIS — R112 Nausea with vomiting, unspecified: Secondary | ICD-10-CM

## 2011-09-14 DIAGNOSIS — R1084 Generalized abdominal pain: Secondary | ICD-10-CM

## 2011-09-14 DIAGNOSIS — K56609 Unspecified intestinal obstruction, unspecified as to partial versus complete obstruction: Secondary | ICD-10-CM

## 2011-09-14 MED ORDER — PANTOPRAZOLE SODIUM 40 MG PO TBEC
40.0000 mg | DELAYED_RELEASE_TABLET | Freq: Every day | ORAL | Status: DC
  Administered 2011-09-14 – 2011-09-15 (×2): 40 mg via ORAL
  Filled 2011-09-14 (×2): qty 1

## 2011-09-14 MED ORDER — SODIUM BICARBONATE 650 MG PO TABS
1300.0000 mg | ORAL_TABLET | Freq: Every day | ORAL | Status: DC
  Administered 2011-09-14 – 2011-09-15 (×2): 1300 mg via ORAL
  Filled 2011-09-14 (×2): qty 2

## 2011-09-14 NOTE — Progress Notes (Signed)
[facility] PROGRESS NOTE  Eric George AVW:[id number] DOB: November 23, 1930 DOA: 09/12/2011 PCP: Kristian Covey, MD, MD  Assessment/Plan: 1. SBO - recurrent, partial, patient passing flatus and BM. Rechecked AXR 09/13/11 with noted improvement. looks better . Advance diet to regular diet  2. HTN - resumed home meds  3. HL - resumed lipitor   Code Status: full Family Communication: wife Erskine Squibb [phone] Disposition Plan: home  Lonia Blood, [place]  [facility] Pager [phone]  If 7PM-7AM, please contact night-coverage [email or web address] Password [email or web address] 09/14/2011, 4:04 PM   LOS: 2 days   Brief narrative: Eric George is an 78 y.o. male who began to have an episode of lightheadness and diaphoresis followed by sudden onset of nausea and vomiting at 4 PM. He became more uncomfortable and later had ABD Pain. The discomfort was familiar and reminded him of his 3 previous episodes of Small bowel Obstruction. IN the ED he was evaluated and on the Acute ABD Series he was found to have a Small Bowel Obstruction. He was referred for Medical admission. In the past he has not had to have surgical intervention and with conservative therapy his condition would improve.      Subjective: Wants food  Objective: Filed Vitals:   09/13/11 2144 09/14/11 0218 09/14/11 0520 09/14/11 1402  BP: 166/81 131/71 132/67 142/63  Pulse: 65 75 75   Temp: 98.3 F (36.8 C) 98.3 F (36.8 C) 98.5 F (36.9 C) 97.8 F (36.6 C)  TempSrc: Oral Oral Oral Oral  Resp: 18 18 18    Height:      Weight:      SpO2:  97% 97% 96%    Intake/Output Summary (Last 24 hours) at 09/14/11 1604 Last data filed at 09/14/11 1300  Gross per 24 hour  Intake    600 ml  Output      0 ml  Net    600 ml    Exam:   Alert and oriented   CVS: RRR  RS: CTAB  Abdomen : slight distension, NT, BS diminished   Data Reviewed: Basic Metabolic Panel:  Lab 09/13/11 6295 09/12/11 1959  NA 138 135  K 4.5  4.0  CL 105 101  CO2 23 24  GLUCOSE 117* 130*  BUN 20 22  CREATININE 1.11 1.15  CALCIUM 8.6 9.4  MG -- --  PHOS -- --   Liver Function Tests:  Lab 09/12/11 1959  AST 16  ALT 11  ALKPHOS 90  BILITOT 0.4  PROT 6.5  ALBUMIN 3.8    Lab 09/12/11 1959  LIPASE 27  AMYLASE --   No results found for this basename: AMMONIA:5 in the last 168 hours CBC:  Lab 09/13/11 0640 09/12/11 1959  WBC 10.0 15.8*  NEUTROABS -- 13.6*  HGB 14.1 15.3  HCT 41.5 43.2  MCV 83.3 82.3  PLT 189 202   Cardiac Enzymes: No results found for this basename: CKTOTAL:5,CKMB:5,CKMBINDEX:5,TROPONINI:5 in the last 168 hours BNP: No components found with this basename: POCBNP:5 CBG: No results found for this basename: GLUCAP:5 in the last 168 hours  No results found for this or any previous visit (from the past 240 hour(s)).   Studies: Dg Abd Acute W/chest  09/12/2011  *RADIOLOGY REPORT*  Clinical Data: Nausea and vomiting.  History of bowel obstruction.  ACUTE ABDOMEN SERIES (ABDOMEN 2 VIEW & CHEST 1 VIEW)  Comparison: Chest and two views abdomen 12/13/2007.  Findings: Single view of the chest demonstrates clear lungs and normal heart size.  No pneumothorax or pleural effusion.  Two views of the abdomen show dilated loops of small bowel measuring up to 4.8 cm with air-fluid levels.  No free intraperitoneal air is identified.  Small amount of gas is seen in the colon.  IMPRESSION: Study is positive for small bowel obstruction.  Original Report Authenticated By: Bernadene Bell. D'ALESSIO, M.D.    Scheduled Meds:    . enoxaparin  40 mg Subcutaneous Q24H  . Flora-Q  1 capsule Oral Daily  . irbesartan  75 mg Oral Daily  . pantoprazole  40 mg Oral Q1200  . sertraline  50 mg Oral Daily  . simvastatin  20 mg Oral QHS  . sodium bicarbonate  1,300 mg Oral Q1200   Continuous Infusions:    . DISCONTD: sodium chloride 75 mL/hr at 09/13/11 1142

## 2011-09-14 NOTE — Telephone Encounter (Signed)
Wife called at 1:34 - pulled from Triage vmail. States her husband was admitted to [facility] for SBO. Should be discharged tomorrow. She just wanted you to know.

## 2011-09-14 NOTE — Progress Notes (Signed)
09-14-11 UR completed. Ronny Flurry RN BSN

## 2011-09-15 DIAGNOSIS — I10 Essential (primary) hypertension: Secondary | ICD-10-CM

## 2011-09-15 DIAGNOSIS — R1084 Generalized abdominal pain: Secondary | ICD-10-CM

## 2011-09-15 DIAGNOSIS — R112 Nausea with vomiting, unspecified: Secondary | ICD-10-CM

## 2011-09-15 DIAGNOSIS — K56609 Unspecified intestinal obstruction, unspecified as to partial versus complete obstruction: Secondary | ICD-10-CM

## 2011-09-15 NOTE — Progress Notes (Signed)
Patient discharged to home in care of spouse. Medications and instructions reviewed with patient and spouse with no questions. IV d/c'd with cath intact. Assessment unchanged from this am. Patient is to follow up with PCP for hospital follow up and with Dr. Loreta Ave PRN.

## 2011-09-15 NOTE — Discharge Summary (Signed)
Physician Discharge Summary  Eric George ZOX:[id number] DOB: 06/27/1930 DOA: 09/12/2011  PCP: Kristian Covey, MD, MD  Admit date: 09/12/2011 Discharge date: 09/15/2011  Recommendations for Outpatient Follow-up: resolution of diarrhea   Discharge Diagnoses:  1. PSBO - resolved 2. Diarrhea - ? Related to above - improving 3. HTN 4. Hyperlipidemia  Discharge Condition: good,   Diet recommendation: regular  History of present illness:  Eric George is an 78 y.o. male who began to have an episode of lightheadness and diaphoresis followed by sudden onset of nausea and vomiting at 4 PM. He became more uncomfortable and later had ABD Pain. The discomfort was familiar and reminded him of his 3 previous episodes of Small bowel Obstruction. In the ED he was evaluated and on the Acute ABD Series he was found to have a Small Bowel Obstruction. He was referred for Medical admission. In the past he has not had to have surgical intervention and with conservative therapy his condition would improve.    Hospital Course:  78 yo man with hx of PSBO was admitted after an episode of vomiting with abdominal distension and findings of possible SBO. He was placed on iv fluids and bowel rest. By HOD no 2 he was feeling better passing flatus and stool. His diet was advanced without problems. Repeat AXr indicated resolution of SBO. He had some diarrhea after the fact but it is improving. Abdomen is soft and non tender and there is no fever or leukocytosis by the time of discharge to suggest an infectious etiology. Patient will follow up with his primary gastroenterologist if diarrhea does not resolve   Discharge Exam: Filed Vitals:   09/15/11 0608  BP: 142/81  Pulse: 74  Temp: 98.1 F (36.7 C)  Resp: 18   Filed Vitals:   09/14/11 1740 09/14/11 2134 09/15/11 0202 09/15/11 0608  BP: 127/68 151/88 146/76 142/81  Pulse: 72 58 63 74  Temp:  97.8 F (36.6 C)  98.1 F (36.7 C)  TempSrc:  Oral  Oral    Resp:  18  18  Height:      Weight:      SpO2:  94%  99%   Generalalert and oriented to wife Cardiovascular: RRR Respiratory: CTAB Abdomen ; soft, NT, BS present  Discharge Instructions  Discharge Orders    Future Appointments: Provider: Department: Dept Phone: Center:   10/21/2011 11:00 AM Kristian Covey, MD [facility] [phone] [facility]     Future Orders Please Complete By Expires   Diet general      Increase activity slowly        Medication List  As of 09/15/2011  4:52 PM   TAKE these medications         ALIGN 4 MG Caps   Take 1 capsule by mouth daily.      atorvastatin 20 MG tablet   Commonly known as: LIPITOR   Take 20 mg by mouth daily.      irbesartan 75 MG tablet   Commonly known as: AVAPRO   Take 75 mg by mouth daily.      omeprazole-sodium bicarbonate 40-1100 MG per capsule   Commonly known as: ZEGERID   Take 1 capsule by mouth 2 (two) times daily.      sertraline 50 MG tablet   Commonly known as: ZOLOFT   Take 50 mg by mouth daily.           Follow-up Information    Schedule an appointment as soon as possible  for a visit with Kristian Covey, MD.   Contact information:   [address] [place] [address] [phone]           The results of significant diagnostics from this hospitalization (including imaging, microbiology, ancillary and laboratory) are listed below for reference.    Significant Diagnostic Studies: Dg Abd 1 View  09/13/2011  *RADIOLOGY REPORT*  Clinical Data: Small bowel obstruction.  Abdominal pain.  ABDOMEN - 1 VIEW  Comparison: None.  Findings: Improving small bowel obstruction pattern with dilated loops of bowel in the left central abdomen.  Pelvic lymphadenectomy/prostatectomy surgical clips are present.  There is no free air identified.  More prominent colonic gas is present also compatible with resolving small bowel obstruction. Maximal diameter small bowel loops is 54 mm, similar to  the prior exam.  IMPRESSION: Improving bowel gas pattern, suggesting improving partial small bowel obstruction.  Original Report Authenticated By: Andreas Newport, M.D.   Dg Abd Acute W/chest  09/12/2011  *RADIOLOGY REPORT*  Clinical Data: Nausea and vomiting.  History of bowel obstruction.  ACUTE ABDOMEN SERIES (ABDOMEN 2 VIEW & CHEST 1 VIEW)  Comparison: Chest and two views abdomen 12/13/2007.  Findings: Single view of the chest demonstrates clear lungs and normal heart size.  No pneumothorax or pleural effusion.  Two views of the abdomen show dilated loops of small bowel measuring up to 4.8 cm with air-fluid levels.  No free intraperitoneal air is identified.  Small amount of gas is seen in the colon.  IMPRESSION: Study is positive for small bowel obstruction.  Original Report Authenticated By: Bernadene Bell. Maricela Curet, M.D.    Microbiology: No results found for this or any previous visit (from the past 240 hour(s)).   Labs: Basic Metabolic Panel:  Lab 09/13/11 2130 09/12/11 1959  NA 138 135  K 4.5 4.0  CL 105 101  CO2 23 24  GLUCOSE 117* 130*  BUN 20 22  CREATININE 1.11 1.15  CALCIUM 8.6 9.4  MG -- --  PHOS -- --   Liver Function Tests:  Lab 09/12/11 1959  AST 16  ALT 11  ALKPHOS 90  BILITOT 0.4  PROT 6.5  ALBUMIN 3.8    Lab 09/12/11 1959  LIPASE 27  AMYLASE --   No results found for this basename: AMMONIA:5 in the last 168 hours CBC:  Lab 09/13/11 0640 09/12/11 1959  WBC 10.0 15.8*  NEUTROABS -- 13.6*  HGB 14.1 15.3  HCT 41.5 43.2  MCV 83.3 82.3  PLT 189 202   Cardiac Enzymes: No results found for this basename: CKTOTAL:5,CKMB:5,CKMBINDEX:5,TROPONINI:5 in the last 168 hours BNP: No components found with this basename: POCBNP:5 CBG: No results found for this basename: GLUCAP:5 in the last 168 hours  Time coordinating discharge: 25 minutes  Signed:  Lonia Blood, MD  [facility] 09/15/2011, 4:52 PM

## 2011-09-23 ENCOUNTER — Ambulatory Visit (INDEPENDENT_AMBULATORY_CARE_PROVIDER_SITE_OTHER): Payer: Medicare Other | Admitting: Family Medicine

## 2011-09-23 ENCOUNTER — Encounter: Payer: Self-pay | Admitting: Family Medicine

## 2011-09-23 VITALS — BP 142/78 | Temp 97.9°F | Wt 194.0 lb

## 2011-09-23 DIAGNOSIS — I10 Essential (primary) hypertension: Secondary | ICD-10-CM | POA: Diagnosis not present

## 2011-09-23 DIAGNOSIS — K56609 Unspecified intestinal obstruction, unspecified as to partial versus complete obstruction: Secondary | ICD-10-CM | POA: Diagnosis not present

## 2011-09-23 NOTE — Progress Notes (Signed)
  Subjective:    Patient ID: Eric George, male    DOB: 1931/01/13, 78 y.o.   MRN: [id number]  HPI  Hospital followup. Recent hospital discharge summary and recent x-rays and lab work reviewed. Patient admitted 09/12/2011 with small bowel obstruction. Discharged 3 days later. He has had recurrent small bowel obstruction with most recent episode about 2 years ago. Patient developed nausea and vomiting but surprisingly little abdominal pain when presented to emergency department. Acute abdominal series revealed small bowel obstruction. Treated with IV fluids and bowel rest and symptoms gradually improved. Followup x-ray showed improvement and he was progressed slowly with diet. He had some diarrhea initially after resuming diet but none at this time. Patient had mild leukocytosis initially but this improved at discharge. No fever. Denies abdominal pain, nausea, or vomiting at this time.  Apparently had some elevation of blood pressure during hospitalization but blood pressure medication initially held. By home readings around 130/80. He remains on Lipitor, Avapro, Zegerid, and sertraline. No change in medication. Denies any headaches or dizziness. No chest pains. Appetite is fair at this time.  Past Medical History  Diagnosis Date  . Bowel obstruction   . Hydrocephalus   . TBI (traumatic brain injury)   . Colloid cyst of brain   . GERD (gastroesophageal reflux disease)    Past Surgical History  Procedure Date  . Hernia repair   . Prostatectomy   . Brain surgery     reports that he quit smoking about 45 years ago. His smoking use included Cigarettes. He has a 7.5 pack-year smoking history. He does not have any smokeless tobacco history on file. He reports that he does not drink alcohol or use illicit drugs. family history is not on file. Allergies  Allergen Reactions  . Penicillins Rash      Review of Systems  Constitutional: Negative for fever, chills, appetite change and unexpected  weight change.  HENT: Negative for trouble swallowing.   Respiratory: Negative for cough and shortness of breath.   Cardiovascular: Negative for chest pain.  Gastrointestinal: Negative for nausea, vomiting, abdominal pain, diarrhea, constipation, blood in stool and abdominal distention.  Genitourinary: Negative for dysuria.  Neurological: Negative for dizziness and headaches.       Objective:   Physical Exam  Constitutional: He appears well-developed and well-nourished.  Neck: Neck supple. No thyromegaly present.  Cardiovascular: Normal rate and regular rhythm.   Pulmonary/Chest: Effort normal and breath sounds normal. No respiratory distress. He has no wheezes. He has no rales.  Abdominal: Soft. Bowel sounds are normal. He exhibits no distension and no mass. There is no tenderness. There is no rebound and no guarding.  Musculoskeletal: He exhibits no edema.  Lymphadenopathy:    He has no cervical adenopathy.          Assessment & Plan:  #1 recurrent small bowel obstruction. Clinically stable at this time. He had previous surgical consult. No indication of concurrent infection. #2 hypertension. Slightly elevated today. Continue home monitoring and be in touch if consistently over 140/90.

## 2011-09-23 NOTE — Patient Instructions (Signed)
Cancel June appointment and schedule CPE for next December (2013).

## 2011-10-21 ENCOUNTER — Ambulatory Visit: Payer: Medicare Other | Admitting: Family Medicine

## 2011-11-13 DIAGNOSIS — H903 Sensorineural hearing loss, bilateral: Secondary | ICD-10-CM | POA: Diagnosis not present

## 2011-11-23 DIAGNOSIS — H6120 Impacted cerumen, unspecified ear: Secondary | ICD-10-CM | POA: Diagnosis not present

## 2012-02-19 ENCOUNTER — Ambulatory Visit (INDEPENDENT_AMBULATORY_CARE_PROVIDER_SITE_OTHER): Payer: Medicare Other

## 2012-02-19 DIAGNOSIS — Z23 Encounter for immunization: Secondary | ICD-10-CM

## 2012-03-17 ENCOUNTER — Other Ambulatory Visit: Payer: Self-pay | Admitting: Family Medicine

## 2012-03-20 ENCOUNTER — Emergency Department (HOSPITAL_COMMUNITY): Payer: Medicare Other

## 2012-03-20 ENCOUNTER — Encounter (HOSPITAL_COMMUNITY): Payer: Self-pay | Admitting: Emergency Medicine

## 2012-03-20 ENCOUNTER — Inpatient Hospital Stay (HOSPITAL_COMMUNITY)
Admission: EM | Admit: 2012-03-20 | Discharge: 2012-03-23 | DRG: 066 | Disposition: A | Discharge status: Home Health | Payer: Medicare Other | Attending: Internal Medicine | Admitting: Internal Medicine

## 2012-03-20 DIAGNOSIS — R413 Other amnesia: Secondary | ICD-10-CM | POA: Diagnosis not present

## 2012-03-20 DIAGNOSIS — H5340 Unspecified visual field defects: Secondary | ICD-10-CM | POA: Diagnosis present

## 2012-03-20 DIAGNOSIS — R4789 Other speech disturbances: Secondary | ICD-10-CM | POA: Diagnosis not present

## 2012-03-20 DIAGNOSIS — F329 Major depressive disorder, single episode, unspecified: Secondary | ICD-10-CM

## 2012-03-20 DIAGNOSIS — Z87820 Personal history of traumatic brain injury: Secondary | ICD-10-CM

## 2012-03-20 DIAGNOSIS — I6350 Cerebral infarction due to unspecified occlusion or stenosis of unspecified cerebral artery: Secondary | ICD-10-CM

## 2012-03-20 DIAGNOSIS — R233 Spontaneous ecchymoses: Secondary | ICD-10-CM | POA: Diagnosis not present

## 2012-03-20 DIAGNOSIS — R4701 Aphasia: Secondary | ICD-10-CM | POA: Diagnosis present

## 2012-03-20 DIAGNOSIS — I639 Cerebral infarction, unspecified: Secondary | ICD-10-CM

## 2012-03-20 DIAGNOSIS — I6340 Cerebral infarction due to embolism of unspecified cerebral artery: Principal | ICD-10-CM | POA: Diagnosis present

## 2012-03-20 DIAGNOSIS — Z87891 Personal history of nicotine dependence: Secondary | ICD-10-CM

## 2012-03-20 DIAGNOSIS — I700 Atherosclerosis of aorta: Secondary | ICD-10-CM | POA: Diagnosis not present

## 2012-03-20 DIAGNOSIS — F3289 Other specified depressive episodes: Secondary | ICD-10-CM

## 2012-03-20 DIAGNOSIS — E785 Hyperlipidemia, unspecified: Secondary | ICD-10-CM

## 2012-03-20 DIAGNOSIS — R4182 Altered mental status, unspecified: Secondary | ICD-10-CM | POA: Diagnosis not present

## 2012-03-20 DIAGNOSIS — I10 Essential (primary) hypertension: Secondary | ICD-10-CM | POA: Diagnosis not present

## 2012-03-20 DIAGNOSIS — K219 Gastro-esophageal reflux disease without esophagitis: Secondary | ICD-10-CM | POA: Diagnosis present

## 2012-03-20 DIAGNOSIS — R112 Nausea with vomiting, unspecified: Secondary | ICD-10-CM | POA: Diagnosis not present

## 2012-03-20 DIAGNOSIS — Z79899 Other long term (current) drug therapy: Secondary | ICD-10-CM

## 2012-03-20 DIAGNOSIS — Z85828 Personal history of other malignant neoplasm of skin: Secondary | ICD-10-CM

## 2012-03-20 DIAGNOSIS — R6889 Other general symptoms and signs: Secondary | ICD-10-CM | POA: Diagnosis not present

## 2012-03-20 HISTORY — DX: Unspecified malignant neoplasm of skin, unspecified: C44.90

## 2012-03-20 HISTORY — DX: Cerebral infarction, unspecified: I63.9

## 2012-03-20 LAB — CBC
HCT: 44.9 % (ref 39.0–52.0)
Hemoglobin: 15.8 g/dL (ref 13.0–17.0)
MCH: 28.8 pg (ref 26.0–34.0)
MCHC: 35.2 g/dL (ref 30.0–36.0)
MCV: 81.9 fL (ref 78.0–100.0)
Platelets: 202 10*3/uL (ref 150–400)
RBC: 5.48 MIL/uL (ref 4.22–5.81)
RDW: 13.3 % (ref 11.5–15.5)
WBC: 8 10*3/uL (ref 4.0–10.5)

## 2012-03-20 LAB — DIFFERENTIAL
Basophils Absolute: 0 10*3/uL (ref 0.0–0.1)
Basophils Relative: 0 % (ref 0–1)
Eosinophils Absolute: 0.3 10*3/uL (ref 0.0–0.7)
Eosinophils Relative: 3 % (ref 0–5)
Lymphocytes Relative: 22 % (ref 12–46)
Lymphs Abs: 1.7 10*3/uL (ref 0.7–4.0)
Monocytes Absolute: 0.6 10*3/uL (ref 0.1–1.0)
Monocytes Relative: 8 % (ref 3–12)
Neutro Abs: 5.4 10*3/uL (ref 1.7–7.7)
Neutrophils Relative %: 67 % (ref 43–77)

## 2012-03-20 LAB — COMPREHENSIVE METABOLIC PANEL
ALT: 13 U/L (ref 0–53)
AST: 16 U/L (ref 0–37)
Albumin: 3.8 g/dL (ref 3.5–5.2)
Alkaline Phosphatase: 90 U/L (ref 39–117)
BUN: 19 mg/dL (ref 6–23)
CO2: 20 mEq/L (ref 19–32)
Calcium: 9.3 mg/dL (ref 8.4–10.5)
Chloride: 104 mEq/L (ref 96–112)
Creatinine, Ser: 1.01 mg/dL (ref 0.50–1.35)
GFR calc Af Amer: 79 mL/min — ABNORMAL LOW (ref >90)
GFR calc non Af Amer: 69 mL/min — ABNORMAL LOW (ref >90)
Glucose, Bld: 101 mg/dL — ABNORMAL HIGH (ref 70–99)
Potassium: 4.3 mEq/L (ref 3.5–5.1)
Sodium: 137 mEq/L (ref 135–145)
Total Bilirubin: 0.4 mg/dL (ref 0.3–1.2)
Total Protein: 6.7 g/dL (ref 6.0–8.3)

## 2012-03-20 LAB — URINALYSIS, ROUTINE W REFLEX MICROSCOPIC
Bilirubin Urine: NEGATIVE
Glucose, UA: NEGATIVE mg/dL
Hgb urine dipstick: NEGATIVE
Ketones, ur: NEGATIVE mg/dL
Leukocytes, UA: NEGATIVE
Nitrite: NEGATIVE
Protein, ur: NEGATIVE mg/dL
Specific Gravity, Urine: 1.016 (ref 1.005–1.030)
Urobilinogen, UA: 1 mg/dL (ref 0.0–1.0)
pH: 8.5 — ABNORMAL HIGH (ref 5.0–8.0)

## 2012-03-20 LAB — POCT I-STAT TROPONIN I: Troponin i, poc: 0 ng/mL (ref 0.00–0.08)

## 2012-03-20 LAB — GLUCOSE, CAPILLARY: Glucose-Capillary: 102 mg/dL — ABNORMAL HIGH (ref 70–99)

## 2012-03-20 LAB — TROPONIN I: Troponin I: <0.3 ng/mL (ref <0.30)

## 2012-03-20 LAB — APTT: aPTT: 29 seconds (ref 24–37)

## 2012-03-20 LAB — PROTIME-INR
INR: 1.08 (ref 0.00–1.49)
Prothrombin Time: 13.9 seconds (ref 11.6–15.2)

## 2012-03-20 MED ORDER — ACETAMINOPHEN 650 MG RE SUPP: 650.0000 mg | RECTAL | Status: DC | PRN

## 2012-03-20 MED ORDER — ONDANSETRON HCL 4 MG/2ML IJ SOLN: 4.0000 mg | Freq: Four times a day (QID) | INTRAMUSCULAR | Status: DC | PRN

## 2012-03-20 MED ORDER — ONDANSETRON HCL 4 MG/2ML IJ SOLN
4.0000 mg | Freq: Once | INTRAMUSCULAR | Status: AC
  Administered 2012-03-20: 4 mg via INTRAVENOUS
  Filled 2012-03-20: qty 2

## 2012-03-20 MED ORDER — ASPIRIN 300 MG RE SUPP
300.0000 mg | Freq: Once | RECTAL | Status: AC
  Administered 2012-03-20: 300 mg via RECTAL
  Filled 2012-03-20: qty 1

## 2012-03-20 MED ORDER — SODIUM CHLORIDE 0.9 % IV SOLN
INTRAVENOUS | Status: DC
  Administered 2012-03-20 – 2012-03-21 (×3): via INTRAVENOUS
  Administered 2012-03-22: 500 mL via INTRAVENOUS
  Administered 2012-03-23: 03:00:00 via INTRAVENOUS

## 2012-03-20 MED ORDER — ASPIRIN 300 MG RE SUPP
300.0000 mg | Freq: Every day | RECTAL | Status: DC
  Administered 2012-03-21: 300 mg via RECTAL
  Filled 2012-03-20: qty 1

## 2012-03-20 MED ORDER — ENOXAPARIN SODIUM 40 MG/0.4ML ~~LOC~~ SOLN
40.0000 mg | SUBCUTANEOUS | Status: DC
  Administered 2012-03-20 – 2012-03-22 (×3): 40 mg via SUBCUTANEOUS
  Filled 2012-03-20 (×4): qty 0.4

## 2012-03-20 NOTE — ED Notes (Signed)
Pt has baseline hydrocephalus, and TBI- wife reports that pt does have baseline confusion but is able to perform ADLs. Wife reports that pt "seemed off" this morning.

## 2012-03-20 NOTE — H&P (Signed)
[facility] History and Physical  Eric George:[id number] DOB: November 18, 1930 DOA: 03/20/2012  Referring physician: Remi Haggard, NP PCP: Kristian Covey, MD  Specialists:   Dr. Charna Elizabeth: Gastroenterology.  Chief Complaint: Speech abnormality.  HPI: Eric George is a 78 y.o. male with history of hypertension, hyperlipidemia, traumatic brain injury, brain surgery for colloid cyst, residual memory deficits presented to ED on 11/17 with speech abnormalities noticed by his spouse. Patient is unable to provide any history secondary to speech difficulties. History is obtained from spouse. He was apparently last seen in his usual state of health last night. Between 8:30 to 9 AM on 11/17, patient showered, shaved and came to the kitchen, open cabinet and took his Zegerid. He was then trying to open a box of cereal and had difficulty. He told his wife that he was not feeling well and went to sit down. His wife then noticed that he was not responding appropriately to questions and intermittently was having nonsensical and slurred speech. She did not notice obvious facial asymmetry or patient leaning to any one side. He complained of mild dizziness but no headache or chest pain. In the ED, CT head showed no acute abnormality but MRI showed findings consistent with acute ischemic infarction involving left posterior temporal and left parietal region. He was not a candidate for thrombolytic therapy because he was out of the timeframe for such. He could not pass the bedside R.N. swallow screen. She has received rectal aspirin. The hospitalist service is requested to admit for further evaluation and management. Neurology has consulted on patient.   Review of Systems: All systems reviewed with spouse and negative. Patient apparently vomited x1 in the ED but no coffee grounds or blood.  Past Medical History  Diagnosis Date  . Bowel obstruction   . Hydrocephalus   . TBI (traumatic brain injury)     . Colloid cyst of brain   . GERD (gastroesophageal reflux disease)   . Hypertension   . CARCINOMA, SKIN, SQUAMOUS CELL 10/28/2009   Past Surgical History  Procedure Date  . Hernia repair   . Prostatectomy   . Brain surgery    Social History:  reports that he quit smoking about 45 years ago. His smoking use included Cigarettes. He has a 7.5 pack-year smoking history. He does not have any smokeless tobacco history on file. He reports that he does not drink alcohol or use illicit drugs. Married. Lives with spouse and is independent of activities of daily living. Has short-term memory deficits since brain surgery. Remote history of smoking.  Allergies  Allergen Reactions  . Penicillins Rash    Family History  Problem Relation Age of Onset  . Heart disease Sister     Prior to Admission medications   Medication Sig Start Date End Date Taking? Authorizing Provider  atorvastatin (LIPITOR) 20 MG tablet Take 20 mg by mouth daily. 04/13/11  Yes Kristian Covey, MD  irbesartan (AVAPRO) 75 MG tablet Take 75 mg by mouth daily. 04/13/11  Yes Kristian Covey, MD  omeprazole-sodium bicarbonate (ZEGERID) 40-1100 MG per capsule Take 1 capsule by mouth 2 (two) times daily.     Yes Historical Provider, MD  Probiotic Product (ALIGN) 4 MG CAPS Take 1 capsule by mouth daily.   Yes Historical Provider, MD  sertraline (ZOLOFT) 50 MG tablet Take 50 mg by mouth daily.   Yes Historical Provider, MD   Physical Exam: Filed Vitals:   03/20/12 1104 03/20/12 1115 03/20/12 1355 03/20/12 1419  BP: 186/93 162/91  157/88  Pulse: 70 69    Temp: 97.6 F (36.4 C)  97.6 F (36.4 C) 97.7 F (36.5 C)  TempSrc: Oral   Oral  Resp: 18 26  15   SpO2: 99% 99%  99%     General exam: Moderately built and nourished male patient who is lying comfortably supine on the gurney.  Head, eyes and ENT: Nontraumatic and normocephalic. Pupils equally reacting to light and accommodation. Oral mucosa is moist.  Neck: Supple.  No JVD, carotid bruit or thyromegaly.  Lymphatics: No lymphadenopathy.  Respiratory system: Clear to auscultation. No increased work of breathing.  Cardiovascular system: S1 and S2 heard, regular rate and rhythm. No JVD, murmurs or gallops. No pedal edema. Telemetry shows normal sinus rhythm.  Gastrointestinal system: Abdomen is nondistended, soft and nontender. Normal bowel sounds heard no organomegaly or masses appreciated.  Central nervous system: Patient is alert. Oriented to self. Obeys some commands. Has both element of expressive >receptive aphasia and is frustrated by speech difficulty. No obvious facial asymmetry or cranial nerve deficits.  Extremities: Symmetric 5 x 5 power.  Skin: No acute findings.  Muscular skeletal system: No acute findings.  Psychiatric: Patient appears to be frustrated by his difficulty in speech and n.p.o. status.  Labs on Admission:  Basic Metabolic Panel:  Lab 03/20/12 1610  NA 137  K 4.3  CL 104  CO2 20  GLUCOSE 101*  BUN 19  CREATININE 1.01  CALCIUM 9.3  MG --  PHOS --   Liver Function Tests:  Lab 03/20/12 1111  AST 16  ALT 13  ALKPHOS 90  BILITOT 0.4  PROT 6.7  ALBUMIN 3.8   No results found for this basename: LIPASE:5,AMYLASE:5 in the last 168 hours No results found for this basename: AMMONIA:5 in the last 168 hours CBC:  Lab 03/20/12 1111  WBC 8.0  NEUTROABS 5.4  HGB 15.8  HCT 44.9  MCV 81.9  PLT 202   Cardiac Enzymes:  Lab 03/20/12 1111  CKTOTAL --  CKMB --  CKMBINDEX --  TROPONINI <0.30    BNP (last 3 results) No results found for this basename: PROBNP:3 in the last 8760 hours CBG:  Lab 03/20/12 1112  GLUCAP 102*    Radiological Exams on Admission: Dg Chest 2 View  03/20/2012  *RADIOLOGY REPORT*  Clinical Data: Altered mental status.  CHEST - 2 VIEW  Comparison: Chest x-ray 04/27/2007.  Findings: Lung volumes are normal.  No consolidative airspace disease.  No pleural effusions.  No pneumothorax.   No pulmonary nodule or mass noted.  Pulmonary vasculature and the cardiomediastinal silhouette are within normal limits. Atherosclerotic calcifications are noted within the arch of the aorta.  IMPRESSION: 1. No radiographic evidence of acute cardiopulmonary disease. 2.  Atherosclerosis.   Original Report Authenticated By: Trudie Reed, M.D.    Ct Head (brain) Wo Contrast  03/20/2012  *RADIOLOGY REPORT*  Clinical Data: Altered mental status.  Slurred speech and confusion with left-sided facial droop.  Symptoms have now improved, possible TIA.  CT HEAD WITHOUT CONTRAST  Technique:  Contiguous axial images were obtained from the base of the skull through the vertex without contrast.  Comparison: 01/03/2006.  Findings: Encephalomalacia in the right frontal lobe is unchanged. Mild cerebral atrophy.  Patchy and confluent areas of decreased attenuation throughout the deep and periventricular white matter of the cerebral hemispheres bilaterally, compatible with chronic microvascular ischemic disease.  No definite area to suggest acute/subacute cerebral ischemia, no evidence of acute intracerebral hemorrhage,  no focal mass, mass effect, hydrocephalus or abnormal intra or extra-axial fluid collections.  Status post right frontal craniotomy.  No acute displaced skull fractures are identified.  Visualized paranasal sinuses and mastoids are remarkable for extensive areas of mucosal thickening throughout the ethmoid sinuses bilaterally.  No air fluid levels are identified.  IMPRESSION: 1.  No acute intracranial abnormalities. 2.  The appearance of the brain is very similar to prior examination 01/03/2006, with exception of increasing chronic ischemic changes in the cerebral white matter, as above. 3.  Extensive mucosal thickening throughout the ethmoid sinuses bilaterally suggesting chronic sinusitis.   Original Report Authenticated By: Trudie Reed, M.D.    Mr [facility] Wo Contrast  03/20/2012  *RADIOLOGY REPORT*   Clinical Data:   Slurred speech.  Confusion.  High blood pressure. History of traumatic brain injury, high blood pressure and colon cyst surgery  MRI BRAIN WITHOUT CONTRAST MRA HEAD WITHOUT CONTRAST  Technique: Multiplanar, multiecho pulse sequences of the brain and surrounding structures were obtained according to standard protocol without intravenous contrast.  Angiographic images of the head were obtained using MRA technique without contrast.  Comparison: 03/20/2012 head CT.  No comparison brain MR.  MRI HEAD  Findings:  Posterior left temporal - parietal lobe acute non hemorrhagic infarct extending to the posterior opercular region.  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  Small vessel disease type changes.  Global atrophy without hydrocephalus.  No intracranial hemorrhage.  No intracranial mass lesion detected on this unenhanced exam.  Degenerative changes C1-2 articulation.  Partially empty sella incidentally noted.  Paranasal sinus mucosal thickening.  Mild exophthalmos.  IMPRESSION:  Posterior left temporal - parietal lobe acute non hemorrhagic infarct.  Please see above.  MRA HEAD  Findings: Motion degraded exam.  Evaluating for aneurysm or grading stenosis is limited given the degree of motion.  All that can be stated with certainty is that there is flow within portions of the; internal carotid arteries, carotid terminus bilaterally, M1 segment of the middle cerebral arteries bilaterally, portions of the middle cerebral artery branches bilaterally, anterior cerebral artery bilaterally, vertebral arteries, basilar artery, posterior cerebral arteries and superior cerebral arteries.  IMPRESSION:  Motion degraded exam.  Evaluating for aneurysm or grading stenosis is limited given the degree of motion.  Please see above.  Critical Value/emergent results were called by telephone at the time of interpretation  on 03/20/2012 at  1:25 p.m. to Dr. Fonnie Jarvis, who verbally acknowledged these results.   Original Report Authenticated By: Lacy Duverney, M.D.    Mr Brain Wo Contrast  03/20/2012  *RADIOLOGY REPORT*  Clinical Data:   Slurred speech.  Confusion.  High blood pressure. History of traumatic brain injury, high blood pressure and colon cyst surgery  MRI BRAIN WITHOUT CONTRAST MRA HEAD WITHOUT CONTRAST  Technique: Multiplanar, multiecho pulse sequences of the brain and surrounding structures were obtained according to standard protocol without intravenous contrast.  Angiographic images of the head were obtained using MRA technique without contrast.  Comparison: 03/20/2012 head CT.  No comparison brain MR.  MRI HEAD  Findings:  Posterior left temporal - parietal lobe acute non hemorrhagic infarct extending to the posterior opercular region.  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  Small vessel disease type changes.  Global atrophy without hydrocephalus.  No intracranial hemorrhage.  No intracranial mass lesion detected  on this unenhanced exam.  Degenerative changes C1-2 articulation.  Partially empty sella incidentally noted.  Paranasal sinus mucosal thickening.  Mild exophthalmos.  IMPRESSION:  Posterior left temporal - parietal lobe acute non hemorrhagic infarct.  Please see above.  MRA HEAD  Findings: Motion degraded exam.  Evaluating for aneurysm or grading stenosis is limited given the degree of motion.  All that can be stated with certainty is that there is flow within portions of the; internal carotid arteries, carotid terminus bilaterally, M1 segment of the middle cerebral arteries bilaterally, portions of the middle cerebral artery branches bilaterally, anterior cerebral artery bilaterally, vertebral arteries, basilar artery, posterior cerebral arteries and superior cerebral arteries.  IMPRESSION:  Motion  degraded exam.  Evaluating for aneurysm or grading stenosis is limited given the degree of motion.  Please see above.  Critical Value/emergent results were called by telephone at the time of interpretation on 03/20/2012 at  1:25 p.m. to Dr. Fonnie Jarvis, who verbally acknowledged these results.   Original Report Authenticated By: Lacy Duverney, M.D.     EKG: Independently reviewed. Normal sinus rhythm, normal axis, occasional PVCs and no acute changes.  Assessment/Plan Principal Problem:  *CVA (cerebral infarction) Active Problems:  HYPERLIPIDEMIA  HYPERTENSION  GERD  Nausea & vomiting  Hypertension   1. Acute left posterior temporal and parietal ischemic CVA: Associated with expressive >receptive aphasia. Small vessel disease possible etiology. Admit to telemetry. Repeat MRA brain without contrast due to motion degraded initial exam. Complete stroke workup including 2-D echocardiogram, carotid Dopplers, fasting lipids and hemoglobin A1c. Rectal aspirin. PT and OT consulted. ST consulted for speech and swallow evaluation. Brief IV fluids while patient is n.p.o. Neurology consultation appreciated. 2. Hypertension: Currently controlled. Allow for permissive hypertension in the context of acute stroke. Hold antihypertensives. 3. Hyperlipidemia: Check fasting lipids. Hold oral medications until patient passed a swallow. 4. An episode of nausea and vomiting: Keep n.p.o. IV Zofran when necessary. 5. History of traumatic brain injury and cranial surgery, with residual memory deficits: Current speech abnormalities obviously new and worse.    Code Status: Full. This was confirmed with patient spouse. Family Communication: Discussed with patient spouse at bedside. Disposition Plan: DC home in medically stable.  Time spent: 45 minutes  [facility] Pager [phone]  If 7PM-7AM, please contact night-coverage [email or web address] Password [email or web address] 03/20/2012, 5:16 PM

## 2012-03-20 NOTE — ED Notes (Signed)
PT to chest xray and then to MRI

## 2012-03-20 NOTE — ED Notes (Signed)
Speech therapy paged [phone]. Hospital operator reports speech therapy leaves at noon. Paged a request of ADM MD.

## 2012-03-20 NOTE — ED Notes (Signed)
Report received , Pt is in MRI DEPT . At this time

## 2012-03-20 NOTE — ED Notes (Addendum)
0900: wife reported that she saw pt had slurred speech; droop. Called EMS. EMS reported small left sided droop; speech is cleared. Not on blood thinners. Confusion about day, month is baseline per wife. A&O to situation. Hard of hearing.

## 2012-03-20 NOTE — ED Notes (Signed)
Pt knows that urine is needed 

## 2012-03-20 NOTE — ED Notes (Signed)
Patient transported to CT 

## 2012-03-20 NOTE — ED Provider Notes (Signed)
1330  Report received by Dr. Fonnie George for this 78 yo male with garbled speech, l facial droop, disorientation.  Last time seen normal was last pm before bed.  PMH cva as well.  Patient in MRI presently.  1345.  + stroke on MRI to the L posterior temporal-parietal lobe non hemorrhagic infarct extending to the posterior opercular region.  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      Patient will be admitted.  Neurology notified by Dr. Fonnie George.  I will call hospitalists to admit.  PCP is Dr. Caryl George.  1500 patient will be admitted to the Hospitalist Dr. Jamal George team 9 for CVA with neurology consulting.  Family agrees with plan.   Eric Haggard, NP 03/20/12 [phone]

## 2012-03-20 NOTE — ED Provider Notes (Signed)
History     CSN: [id number]  Arrival date & time 03/20/12  1055   First MD Initiated Contact with Patient 03/20/12 1125      Chief Complaint  Patient presents with  . Altered Mental Status    (Consider location/radiation/quality/duration/timing/severity/associated sxs/prior treatment) HPI This 78 year old male has a history of baseline confusion and was last known at his baseline yesterday evening, today his wife noticed that he was performing some normal daily activities at 9:00 this morning she started to talk to him he seemed more confused than usual he might possibly have had a slight facial droop and slightly slurred speech although that was not definite, he did not appear to have lateralizing weakness or incoordination, he denied any specific complaints but when she asked if he felt right or not he answered that he did not know, at baseline he is oriented to person and place but not to time although he might not today the week sometimes. There's been no fever vomiting bloody stools cough chest pain shortness breath or trauma. Past Medical History  Diagnosis Date  . Bowel obstruction   . Hydrocephalus   . TBI (traumatic brain injury)   . Colloid cyst of brain   . GERD (gastroesophageal reflux disease)   . Hypertension   . CARCINOMA, SKIN, SQUAMOUS CELL 10/28/2009    Past Surgical History  Procedure Date  . Hernia repair   . Prostatectomy   . Brain surgery   . Tee without cardioversion 03/22/2012    Procedure: TRANSESOPHAGEAL ECHOCARDIOGRAM (TEE);  Surgeon: Dolores Patty, MD;  Location: [facility] ENDOSCOPY;  Service: Cardiovascular;  Laterality: N/A;    Family History  Problem Relation Age of Onset  . Heart disease Sister     History  Substance Use Topics  . Smoking status: Former Smoker -- 0.5 packs/day for 15 years    Types: Cigarettes    Quit date: 09/30/1966  . Smokeless tobacco: Not on file  . Alcohol Use: No      Review of Systems  Unable to perform ROS:  Mental status change    Allergies  Penicillins  Home Medications   Current Outpatient Rx  Name  Route  Sig  Dispense  Refill  . IRBESARTAN 75 MG PO TABS   Oral   Take 75 mg by mouth daily.         Marland Kitchen OMEPRAZOLE-SODIUM BICARBONATE 40-1100 MG PO CAPS   Oral   Take 1 capsule by mouth 2 (two) times daily.           Marland Kitchen ALIGN 4 MG PO CAPS   Oral   Take 1 capsule by mouth daily.         . SERTRALINE HCL 50 MG PO TABS   Oral   Take 50 mg by mouth daily.         . ASPIRIN 325 MG PO TABS   Oral   Take 1 tablet (325 mg total) by mouth daily.   31 tablet   0   . ATORVASTATIN CALCIUM 40 MG PO TABS   Oral   Take 1 tablet (40 mg total) by mouth daily at 6 PM.   31 tablet   0     BP 157/84  Pulse 80  Temp 98 F (36.7 C) (Oral)  Resp 18  Ht 6\' 3"  (1.905 m)  Wt 186 lb 8 oz (84.596 kg)  BMI 23.31 kg/m2  SpO2 94%  Physical Exam  Nursing note and vitals reviewed. Constitutional:  Awake, alert, nontoxic appearance with baseline speech for patient.  HENT:  Head: Atraumatic.  Mouth/Throat: No oropharyngeal exudate.  Eyes: EOM are normal. Pupils are equal, round, and reactive to light. Right eye exhibits no discharge. Left eye exhibits no discharge.  Neck: Neck supple.  Cardiovascular: Normal rate and regular rhythm.   No murmur heard. Pulmonary/Chest: Effort normal and breath sounds normal. No stridor. No respiratory distress. He has no wheezes. He has no rales. He exhibits no tenderness.  Abdominal: Soft. Bowel sounds are normal. He exhibits no mass. There is no tenderness. There is no rebound.  Musculoskeletal: He exhibits no tenderness.       Baseline ROM, moves extremities with no obvious new focal weakness.  Lymphadenopathy:    He has no cervical adenopathy.  Neurological: He is alert.       Awake, alert, cooperative, oriented to person and place; motor strength 5/5 bilaterally; sensation normal to light touch bilaterally; peripheral visual fields full to  confrontation; slight lower left facial droop facial asymmetry; tongue midline; otherwise major cranial nerves appear intact; no pronator drift arms or legs, normal finger to nose bilaterally; difficulty following some simple commands which is new compared to baseline according to family  Skin: No rash noted.  Psychiatric: He has a normal mood and affect.    ED Course  Procedures (including critical care time)  ECG: Normal sinus rhythm, ventricular rate 68, premature ventricular complexes, borderline AV conduction delay, no acute ischemic changes noted, no significant change noted compared with May 2013  Patient / Family / Caregiver understand and agree with initial ED impression and plan with expectations set for ED visit.Medical screening examination/treatment/procedure(s) were conducted as a shared visit with non-physician practitioner(s) and myself.  I personally evaluated the patient during the encounter will move to CDU and anticipate admission by [facility] for altered mental status and rule out stroke although patient does not a code stroke candidate because of unknown onset of symptoms last known well yesterday.  tPA in stroke considered, but not given due to the following: Onset over 3-4.5 hours  D/w Neuro after MR positive for consult.1255    Labs Reviewed  COMPREHENSIVE METABOLIC PANEL - Abnormal; Notable for the following:    Glucose, Bld 101 (*)     GFR calc non Af Amer 69 (*)     GFR calc Af Amer 79 (*)     All other components within normal limits  GLUCOSE, CAPILLARY - Abnormal; Notable for the following:    Glucose-Capillary 102 (*)     All other components within normal limits  URINALYSIS, ROUTINE W REFLEX MICROSCOPIC - Abnormal; Notable for the following:    APPearance CLOUDY (*)     pH 8.5 (*)     All other components within normal limits  HEMOGLOBIN A1C - Abnormal; Notable for the following:    Hemoglobin A1C 5.9 (*)     Mean Plasma Glucose 123 (*)      All other components within normal limits  LIPID PANEL - Abnormal; Notable for the following:    HDL 34 (*)     All other components within normal limits  C-REACTIVE PROTEIN - Abnormal; Notable for the following:    CRP 0.5 (*)     All other components within normal limits  PROTIME-INR  APTT  CBC  DIFFERENTIAL  TROPONIN I  POCT I-STAT TROPONIN I  MAGNESIUM  ANA  SEDIMENTATION RATE   No results found.   1. CVA (cerebral infarction)  2. Stroke   3. Hypertension   4. Other and unspecified hyperlipidemia   5. Nausea & vomiting   6. Depressive disorder, not elsewhere classified   7. Unspecified essential hypertension   8. Memory loss       MDM          Hurman Horn, MD 03/23/12 [phone]

## 2012-03-20 NOTE — Consult Note (Signed)
Referring Physician: Dr. Fonnie Jarvis    Chief Complaint: New-onset speech abnormality and confusion.  HPI: Eric George is an 78 y.o. male with a history of traumatic brain injury, colloid cyst and hydrocephalus, recurrent small bowel obstruction, hyperlipidemia and hypertension who was noted by his wife this morning to be exhibiting nonsensical speech output. He was last seen normal at 11 PM on 03/19/2012. He was brought to the emergency room for further evaluation. He has a history of traumatic brain injury. His speech abnormality was clearly new. CT scan of his head showed no acute abnormality. MRI showed findings consistent with acute ischemic infarction involving the left posterior temporal and left parietal region. Patient was not a candidate for thrombolytic therapy consideration because of the amount of time that had elapsed since he was last seen normal. He has not been on antiplatelet therapy. NIH stroke score was 4.  LSN: 11 PM on 03/19/2012 tPA Given: No: Beyond time window for treatment consideration MRankin: 2  Past Medical History  Diagnosis Date  . Bowel obstruction   . Hydrocephalus   . TBI (traumatic brain injury)   . Colloid cyst of brain   . GERD (gastroesophageal reflux disease)   . Hypertension   . CARCINOMA, SKIN, SQUAMOUS CELL 10/28/2009    No family history on file.   Medications:  Prior to Admission:  Lipitor 20 mg per day Avapro 75 mg per day Omeprazole-sodium bicarbonate 40-1100 mg per day Align 4 mg per day Zoloft 50 mg per day  Physical Examination: Blood pressure 157/88, pulse 69, temperature 97.7 F (36.5 C), temperature source Oral, resp. rate 15, SpO2 99.00%.  Neurologic Examination: Mental Status: Alert, no acute distress.  Mild receptive and moderately severe expressive aphasia.  Cranial Nerves: II-dense right homonymous hemianopsia. III/IV/VI-Pupils were equal and reacted. Extraocular movements were full and conjugate.    V/VII-no facial  numbness and no facial weakness. VIII-normal. X-no dysarthria; symmetrical palatal movement. XII-midline tongue extension Motor: 5/5 bilaterally with normal tone and bulk Sensory: Normal throughout. Deep Tendon Reflexes: 2+ and symmetric. Plantars: Flexor bilaterally Cerebellar: Normal finger-to-nose testing. Carotid auscultation: Normal   Dg Chest 2 View  03/20/2012  *RADIOLOGY REPORT*  Clinical Data: Altered mental status.  CHEST - 2 VIEW  Comparison: Chest x-ray 04/27/2007.  Findings: Lung volumes are normal.  No consolidative airspace disease.  No pleural effusions.  No pneumothorax.  No pulmonary nodule or mass noted.  Pulmonary vasculature and the cardiomediastinal silhouette are within normal limits. Atherosclerotic calcifications are noted within the arch of the aorta.  IMPRESSION: 1. No radiographic evidence of acute cardiopulmonary disease. 2.  Atherosclerosis.   Original Report Authenticated By: Trudie Reed, M.D.    Ct Head (brain) Wo Contrast  03/20/2012  *RADIOLOGY REPORT*  Clinical Data: Altered mental status.  Slurred speech and confusion with left-sided facial droop.  Symptoms have now improved, possible TIA.  CT HEAD WITHOUT CONTRAST  Technique:  Contiguous axial images were obtained from the base of the skull through the vertex without contrast.  Comparison: 01/03/2006.  Findings: Encephalomalacia in the right frontal lobe is unchanged. Mild cerebral atrophy.  Patchy and confluent areas of decreased attenuation throughout the deep and periventricular white matter of the cerebral hemispheres bilaterally, compatible with chronic microvascular ischemic disease.  No definite area to suggest acute/subacute cerebral ischemia, no evidence of acute intracerebral hemorrhage, no focal mass, mass effect, hydrocephalus or abnormal intra or extra-axial fluid collections.  Status post right frontal craniotomy.  No acute displaced skull fractures are identified.  Visualized paranasal sinuses  and mastoids are remarkable for extensive areas of mucosal thickening throughout the ethmoid sinuses bilaterally.  No air fluid levels are identified.  IMPRESSION: 1.  No acute intracranial abnormalities. 2.  The appearance of the brain is very similar to prior examination 01/03/2006, with exception of increasing chronic ischemic changes in the cerebral white matter, as above. 3.  Extensive mucosal thickening throughout the ethmoid sinuses bilaterally suggesting chronic sinusitis.   Original Report Authenticated By: Trudie Reed, M.D.    Mr [facility] Wo Contrast  03/20/2012  *RADIOLOGY REPORT*  Clinical Data:   Slurred speech.  Confusion.  High blood pressure. History of traumatic brain injury, high blood pressure and colon cyst surgery  MRI BRAIN WITHOUT CONTRAST MRA HEAD WITHOUT CONTRAST  Technique: Multiplanar, multiecho pulse sequences of the brain and surrounding structures were obtained according to standard protocol without intravenous contrast.  Angiographic images of the head were obtained using MRA technique without contrast.  Comparison: 03/20/2012 head CT.  No comparison brain MR.  MRI HEAD  Findings:  Posterior left temporal - parietal lobe acute non hemorrhagic infarct extending to the posterior opercular region.  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  Small vessel disease type changes.  Global atrophy without hydrocephalus.  No intracranial hemorrhage.  No intracranial mass lesion detected on this unenhanced exam.  Degenerative changes C1-2 articulation.  Partially empty sella incidentally noted.  Paranasal sinus mucosal thickening.  Mild exophthalmos.  IMPRESSION:  Posterior left temporal - parietal lobe acute non hemorrhagic infarct.  Please see above.  MRA HEAD  Findings: Motion degraded exam.  Evaluating for aneurysm or grading stenosis is limited given the degree of motion.  All that can  be stated with certainty is that there is flow within portions of the; internal carotid arteries, carotid terminus bilaterally, M1 segment of the middle cerebral arteries bilaterally, portions of the middle cerebral artery branches bilaterally, anterior cerebral artery bilaterally, vertebral arteries, basilar artery, posterior cerebral arteries and superior cerebral arteries.  IMPRESSION:  Motion degraded exam.  Evaluating for aneurysm or grading stenosis is limited given the degree of motion.  Please see above.  Critical Value/emergent results were called by telephone at the time of interpretation on 03/20/2012 at  1:25 p.m. to Dr. Fonnie Jarvis, who verbally acknowledged these results.   Original Report Authenticated By: Lacy Duverney, M.D.    Mr Brain Wo Contrast  03/20/2012  *RADIOLOGY REPORT*  Clinical Data:   Slurred speech.  Confusion.  High blood pressure. History of traumatic brain injury, high blood pressure and colon cyst surgery  MRI BRAIN WITHOUT CONTRAST MRA HEAD WITHOUT CONTRAST  Technique: Multiplanar, multiecho pulse sequences of the brain and surrounding structures were obtained according to standard protocol without intravenous contrast.  Angiographic images of the head were obtained using MRA technique without contrast.  Comparison: 03/20/2012 head CT.  No comparison brain MR.  MRI HEAD  Findings:  Posterior left temporal - parietal lobe acute non hemorrhagic infarct extending to the posterior opercular region.  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  Small vessel disease type changes.  Global atrophy without hydrocephalus.  No intracranial hemorrhage.  No intracranial mass lesion detected on this unenhanced exam.  Degenerative changes C1-2 articulation.  Partially empty sella incidentally noted.  Paranasal sinus mucosal thickening.  Mild exophthalmos.  IMPRESSION:  Posterior left  temporal -  parietal lobe acute non hemorrhagic infarct.  Please see above.  MRA HEAD  Findings: Motion degraded exam.  Evaluating for aneurysm or grading stenosis is limited given the degree of motion.  All that can be stated with certainty is that there is flow within portions of the; internal carotid arteries, carotid terminus bilaterally, M1 segment of the middle cerebral arteries bilaterally, portions of the middle cerebral artery branches bilaterally, anterior cerebral artery bilaterally, vertebral arteries, basilar artery, posterior cerebral arteries and superior cerebral arteries.  IMPRESSION:  Motion degraded exam.  Evaluating for aneurysm or grading stenosis is limited given the degree of motion.  Please see above.  Critical Value/emergent results were called by telephone at the time of interpretation on 03/20/2012 at  1:25 p.m. to Dr. Fonnie Jarvis, who verbally acknowledged these results.   Original Report Authenticated By: Lacy Duverney, M.D.     Assessment: 78 y.o. male with a history of traumatic brain injury, hypertension and hyperlipidemia presenting with acute left posterior temporal and parietal ischemic stroke with mild receptive and moderate to severe expressive aphasia as well as dense right visual field defect.  Stroke Risk Factors - hypertension, hyperlipidemia  Plan: 1. HgbA1c, fasting lipid panel 2. Repeat MRA, or contrast CTA  of the brain without contrast 3. PT consult, OT consult, Speech consult 4. Echocardiogram 5. Carotid dopplers 6. Prophylactic therapy-Antiplatelet med: Aspirin 81 mg per day 7. Risk factor modification 8. Telemetry monitoring  C.R. Roseanne Reno, MD [facility] [phone]  03/20/2012, 3:18 PM

## 2012-03-20 NOTE — ED Notes (Signed)
Attempted to collect urine, pt could not void 

## 2012-03-21 DIAGNOSIS — I10 Essential (primary) hypertension: Secondary | ICD-10-CM | POA: Diagnosis not present

## 2012-03-21 DIAGNOSIS — F329 Major depressive disorder, single episode, unspecified: Secondary | ICD-10-CM

## 2012-03-21 DIAGNOSIS — I6350 Cerebral infarction due to unspecified occlusion or stenosis of unspecified cerebral artery: Secondary | ICD-10-CM

## 2012-03-21 LAB — MAGNESIUM: Magnesium: 2 mg/dL (ref 1.5–2.5)

## 2012-03-21 LAB — LIPID PANEL
Cholesterol: 145 mg/dL (ref 0–200)
HDL: 34 mg/dL — ABNORMAL LOW (ref >39)
LDL Cholesterol: 90 mg/dL (ref 0–99)
Total CHOL/HDL Ratio: 4.3 RATIO
Triglycerides: 103 mg/dL (ref <150)
VLDL: 21 mg/dL (ref 0–40)

## 2012-03-21 LAB — HEMOGLOBIN A1C
Hgb A1c MFr Bld: 5.9 % — ABNORMAL HIGH (ref <5.7)
Mean Plasma Glucose: 123 mg/dL — ABNORMAL HIGH (ref <117)

## 2012-03-21 LAB — SEDIMENTATION RATE: Sed Rate: 4 mm/hr (ref 0–16)

## 2012-03-21 MED ORDER — PANTOPRAZOLE SODIUM 40 MG PO TBEC
40.0000 mg | DELAYED_RELEASE_TABLET | Freq: Every day | ORAL | Status: DC
  Administered 2012-03-21 – 2012-03-23 (×3): 40 mg via ORAL
  Filled 2012-03-21 (×2): qty 1

## 2012-03-21 MED ORDER — IRBESARTAN 75 MG PO TABS
75.0000 mg | ORAL_TABLET | Freq: Every day | ORAL | Status: DC
  Administered 2012-03-21 – 2012-03-23 (×3): 75 mg via ORAL
  Filled 2012-03-21 (×3): qty 1

## 2012-03-21 MED ORDER — ATORVASTATIN CALCIUM 20 MG PO TABS
20.0000 mg | ORAL_TABLET | Freq: Every day | ORAL | Status: DC
  Administered 2012-03-21 – 2012-03-22 (×2): 20 mg via ORAL
  Filled 2012-03-21 (×3): qty 1

## 2012-03-21 MED ORDER — SERTRALINE HCL 50 MG PO TABS
50.0000 mg | ORAL_TABLET | Freq: Every day | ORAL | Status: DC
  Administered 2012-03-21 – 2012-03-23 (×3): 50 mg via ORAL
  Filled 2012-03-21 (×3): qty 1

## 2012-03-21 MED ORDER — ASPIRIN 325 MG PO TABS
325.0000 mg | ORAL_TABLET | Freq: Every day | ORAL | Status: DC
  Administered 2012-03-22 – 2012-03-23 (×2): 325 mg via ORAL
  Filled 2012-03-21 (×2): qty 1
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
## 2012-03-21 NOTE — Progress Notes (Signed)
  Echocardiogram 2D Echocardiogram has been performed.  Cathie Beams 03/21/2012, 3:22 PM

## 2012-03-21 NOTE — Progress Notes (Signed)
[facility] PROGRESS NOTE  Eric George MRN:[id number] DOB: 05/08/1930 DOA: 03/20/2012 PCP: BURCHETTE,BRUCE W, MD  Assessment/Plan: Principal Problem:  *CVA (cerebral infarction) -Acute left posterior temporal and parietal ischemic CVA: Associated with expressive >receptive aphasia. - continue ASA -discussed with Neuro, TEE in am - appreciate neuro assistance - carotid dopplers ned for ica stenosis, await echo results Active Problems:  HYPERLIPIDEMIA  -continue lipitor HYPERTENSION  -continue avapro, allow for permissive hypertension GERD  -continue PPI   Code Status : full Family Communication: son at bedside Disposition Plan: home when medically ready   Consultants:  neuro  Procedures: Carotid duplex completed.  Preliminary report: Bilateral: No evidence of hemodynamically significant internal carotid artery stenosis. Vertebral artery flow is antegrade.    Antibiotics:  none  HPI/Subjective: Speech about the same- more expressive deficits than receptive. Denies new c/o  Objective: Filed Vitals:   03/21/12 0209 03/21/12 0415 03/21/12 0526 03/21/12 1857  BP: 164/86 149/80 155/75 156/78  Pulse: 86 88 97 76  Temp: 98.2 F (36.8 C) 98.2 F (36.8 C) 98.2 F (36.8 C) 98.3 F (36.8 C)  TempSrc: Oral Oral Oral Oral  Resp: 15 16 16 18  Height:      Weight:      SpO2: 100% 100% 100% 99%    Intake/Output Summary (Last 24 hours) at 03/21/12 2111 Last data filed at 03/21/12 0600  Gross per 24 hour  Intake    810 ml  Output      0 ml  Net    810 ml   Filed Weights   03/20/12 2001  Weight: 84.596 kg (186 lb 8 oz)    Exam:   General: alert and oriented x1  Cardiovascular: RRR nl S1S2  Respiratory: clear  Abdomen: soft, +BS NT/ND  Neuro: stregth 5/5 and symmetric  Data Reviewed: Basic Metabolic Panel:  Lab 03/21/12 0545 03/20/12 1111  NA -- 137  K -- 4.3  CL -- 104  CO2 -- 20  GLUCOSE -- 101*  BUN -- 19  CREATININE -- 1.01    CALCIUM -- 9.3  MG 2.0 --  PHOS -- --   Liver Function Tests:  Lab 03/20/12 1111  AST 16  ALT 13  ALKPHOS 90  BILITOT 0.4  PROT 6.7  ALBUMIN 3.8   No results found for this basename: LIPASE:5,AMYLASE:5 in the last 168 hours No results found for this basename: AMMONIA:5 in the last 168 hours CBC:  Lab 03/20/12 1111  WBC 8.0  NEUTROABS 5.4  HGB 15.8  HCT 44.9  MCV 81.9  PLT 202   Cardiac Enzymes:  Lab 03/20/12 1111  CKTOTAL --  CKMB --  CKMBINDEX --  TROPONINI <0.30   BNP (last 3 results) No results found for this basename: PROBNP:3 in the last 8760 hours CBG:  Lab 03/20/12 1112  GLUCAP 102*    No results found for this or any previous visit (from the past 240 hour(s)).   Studies: Dg Chest 2 View  03/20/2012  *RADIOLOGY REPORT*  Clinical Data: Altered mental status.  CHEST - 2 VIEW  Comparison: Chest x-ray 04/27/2007.  Findings: Lung volumes are normal.  No consolidative airspace disease.  No pleural effusions.  No pneumothorax.  No pulmonary nodule or mass noted.  Pulmonary vasculature and the cardiomediastinal silhouette are within normal limits. Atherosclerotic calcifications are noted within the arch of the aorta.  IMPRESSION: 1. No radiographic evidence of acute cardiopulmonary disease. 2.  Atherosclerosis.   Original Report Authenticated By: Daniel Entrikin, M.D.      Ct Head (brain) Wo Contrast  03/20/2012  *RADIOLOGY REPORT*  Clinical Data: Altered mental status.  Slurred speech and confusion with left-sided facial droop.  Symptoms have now improved, possible TIA.  CT HEAD WITHOUT CONTRAST  Technique:  Contiguous axial images were obtained from the base of the skull through the vertex without contrast.  Comparison: 01/03/2006.  Findings: Encephalomalacia in the right frontal lobe is unchanged. Mild cerebral atrophy.  Patchy and confluent areas of decreased attenuation throughout the deep and periventricular white matter of the cerebral hemispheres bilaterally,  compatible with chronic microvascular ischemic disease.  No definite area to suggest acute/subacute cerebral ischemia, no evidence of acute intracerebral hemorrhage, no focal mass, mass effect, hydrocephalus or abnormal intra or extra-axial fluid collections.  Status post right frontal craniotomy.  No acute displaced skull fractures are identified.  Visualized paranasal sinuses and mastoids are remarkable for extensive areas of mucosal thickening throughout the ethmoid sinuses bilaterally.  No air fluid levels are identified.  IMPRESSION: 1.  No acute intracranial abnormalities. 2.  The appearance of the brain is very similar to prior examination 01/03/2006, with exception of increasing chronic ischemic changes in the cerebral white matter, as above. 3.  Extensive mucosal thickening throughout the ethmoid sinuses bilaterally suggesting chronic sinusitis.   Original Report Authenticated By: Daniel Entrikin, M.D.    Mr Mra Head Wo Contrast  03/20/2012  *RADIOLOGY REPORT*  Clinical Data:   Slurred speech.  Confusion.  High blood pressure. History of traumatic brain injury, high blood pressure and colon cyst surgery  MRI BRAIN WITHOUT CONTRAST MRA HEAD WITHOUT CONTRAST  Technique: Multiplanar, multiecho pulse sequences of the brain and surrounding structures were obtained according to standard protocol without intravenous contrast.  Angiographic images of the head were obtained using MRA technique without contrast.  Comparison: 03/20/2012 head CT.  No comparison brain MR.  MRI HEAD  Findings:  Posterior left temporal - parietal lobe acute non hemorrhagic infarct extending to the posterior opercular region.  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  Small vessel disease type changes.  Global atrophy without hydrocephalus.  No intracranial hemorrhage.  No intracranial mass lesion detected on this unenhanced exam.   Degenerative changes C1-2 articulation.  Partially empty sella incidentally noted.  Paranasal sinus mucosal thickening.  Mild exophthalmos.  IMPRESSION:  Posterior left temporal - parietal lobe acute non hemorrhagic infarct.  Please see above.  MRA HEAD  Findings: Motion degraded exam.  Evaluating for aneurysm or grading stenosis is limited given the degree of motion.  All that can be stated with certainty is that there is flow within portions of the; internal carotid arteries, carotid terminus bilaterally, M1 segment of the middle cerebral arteries bilaterally, portions of the middle cerebral artery branches bilaterally, anterior cerebral artery bilaterally, vertebral arteries, basilar artery, posterior cerebral arteries and superior cerebral arteries.  IMPRESSION:  Motion degraded exam.  Evaluating for aneurysm or grading stenosis is limited given the degree of motion.  Please see above.  Critical Value/emergent results were called by telephone at the time of interpretation on 03/20/2012 at  1:25 p.m. to Dr. Bednar, who verbally acknowledged these results.   Original Report Authenticated By: Steven Olson, M.D.    Mr Brain Wo Contrast  03/20/2012  *RADIOLOGY REPORT*  Clinical Data:   Slurred speech.  Confusion.  High blood pressure. History of traumatic brain injury, high blood pressure and colon cyst surgery  MRI BRAIN WITHOUT CONTRAST   MRA HEAD WITHOUT CONTRAST  Technique: Multiplanar, multiecho pulse sequences of the brain and surrounding structures were obtained according to standard protocol without intravenous contrast.  Angiographic images of the head were obtained using MRA technique without contrast.  Comparison: 03/20/2012 head CT.  No comparison brain MR.  MRI HEAD  Findings:  Posterior left temporal - parietal lobe acute non hemorrhagic infarct extending to the posterior opercular region.  Remote right frontal lobe surgery for resection of colloid cyst with encephalomalacia right frontal lobe and  mild dilation right frontal horn.  Small vessel disease type changes.  Global atrophy without hydrocephalus.  No intracranial hemorrhage.  No intracranial mass lesion detected on this unenhanced exam.  Degenerative changes C1-2 articulation.  Partially empty sella incidentally noted.  Paranasal sinus mucosal thickening.  Mild exophthalmos.  IMPRESSION:  Posterior left temporal - parietal lobe acute non hemorrhagic infarct.  Please see above.  MRA HEAD  Findings: Motion degraded exam.  Evaluating for aneurysm or grading stenosis is limited given the degree of motion.  All that can be stated with certainty is that there is flow within portions of the; internal carotid arteries, carotid terminus bilaterally, M1 segment of the middle cerebral arteries bilaterally, portions of the middle cerebral artery branches bilaterally, anterior cerebral artery bilaterally, vertebral arteries, basilar artery, posterior cerebral arteries and superior cerebral arteries.  IMPRESSION:  Motion degraded exam.  Evaluating for aneurysm or grading stenosis is limited given the degree of motion.  Please see above.  Critical Value/emergent results were called by telephone at the time of interpretation on 03/20/2012 at  1:25 p.m. to Dr. Bednar, who verbally acknowledged these results.   Original Report Authenticated By: Steven Olson, M.D.     Scheduled Meds:   . aspirin  325 mg Oral Daily  . atorvastatin  20 mg Oral Daily  . enoxaparin  40 mg Subcutaneous Q24H  . irbesartan  75 mg Oral Daily  . pantoprazole  40 mg Oral Daily  . sertraline  50 mg Oral Daily  . [DISCONTINUED] aspirin  300 mg Rectal Daily   Continuous Infusions:   . sodium chloride 75 mL/hr at 03/21/12 2043    Principal Problem:  *CVA (cerebral infarction) Active Problems:  HYPERLIPIDEMIA  HYPERTENSION  GERD  Nausea & vomiting  Hypertension    Time spent:40mins    Mallika Sanmiguel C  [facility] Pager [phone]. If 8PM-8AM, please contact  night-coverage at [email or web address], password [email or web address] 03/21/2012, 9:11 PM  LOS: 1 day              

## 2012-03-21 NOTE — Progress Notes (Signed)
Pt had a 4 beat run of Vtach, nonsustained. Pt asymptomatic. MD on-call aware and orders made. Will continue to monitor patient.

## 2012-03-21 NOTE — Evaluation (Signed)
Clinical/Bedside Swallow Evaluation Patient Details  Name: Eric George MRN: [id number] Date of Birth: 1930/10/09  Today's Date: 03/21/2012 Time: 4098-1191 SLP Time Calculation (min): 15 min  Past Medical History:  Past Medical History  Diagnosis Date  . Bowel obstruction   . Hydrocephalus   . TBI (traumatic brain injury)   . Colloid cyst of brain   . GERD (gastroesophageal reflux disease)   . Hypertension   . CARCINOMA, SKIN, SQUAMOUS CELL 10/28/2009   Past Surgical History:  Past Surgical History  Procedure Date  . Hernia repair   . Prostatectomy   . Brain surgery    HPI:  78 year old male admitted with acute left posterior temporal and parietal ischemic CVAs. Patient failed the RN stroke swallow screen due to inability to cough on command.    Assessment / Plan / Recommendation Clinical Impression  Patient presents with a functional oropharyngeal swallow without overt indication of aspiration. No SLP f/u for dysphagia needed at this time. SLP will f/u 11/19 for cognitive-linguistic evaluation given the presence of global aphasia and cognitive impairements at bedside during today's exam.     Aspiration Risk  Mild    Diet Recommendation Regular;Thin liquid   Liquid Administration via: Cup;Straw Medication Administration: Whole meds with liquid Supervision: Patient able to self feed;Intermittent supervision to cue for compensatory strategies Compensations: Slow rate;Small sips/bites Postural Changes and/or Swallow Maneuvers: Seated upright 90 degrees    Other  Recommendations Oral Care Recommendations: Oral care BID   F/u: Outpatient SLP for aphasia/cognition only      Pertinent Vitals/Pain n/a     Swallow Study Prior Functional Status  Type of Home: House Lives With: Spouse Available Help at Discharge: Family;Available 24 hours/day (except when wife needs to run errands) Vocation: Retired    Radio producer HPI: 78 year old male admitted with acute left posterior  temporal and parietal ischemic CVAs. Patient failed the RN stroke swallow screen due to inability to cough on command.  Type of Study: Bedside swallow evaluation Previous Swallow Assessment: none Diet Prior to this Study: NPO Temperature Spikes Noted: No Respiratory Status: Room air History of Recent Intubation: No Behavior/Cognition: Alert;Cooperative;Pleasant mood (global aphasia) Oral Cavity - Dentition: Adequate natural dentition Self-Feeding Abilities: Able to feed self Patient Positioning: Upright in bed Baseline Vocal Quality: Clear Volitional Cough: Strong Volitional Swallow: Able to elicit    Oral/Motor/Sensory Function Overall Oral Motor/Sensory Function: Appears within functional limits for tasks assessed   Ice Chips Ice chips: Not tested   Thin Liquid Thin Liquid: Within functional limits Presentation: Cup;Self Fed;Straw    Nectar Thick Nectar Thick Liquid: Not tested   Honey Thick Honey Thick Liquid: Not tested   Puree Puree: Within functional limits Presentation: Self Fed;Spoon   Solid   GO Functional Assessment Tool Used: skilled clinical judgement Functional Limitations: Swallowing Swallow Current Status (Y7829): 0 percent impaired, limited or restricted Swallow Goal Status (F6213): 0 percent impaired, limited or restricted Swallow Discharge Status [phone]): 0 percent impaired, limited or restricted  Solid: Within functional limits Presentation: Self Fed      Evangelyn Crouse MA, CCC-SLP [phone]  Shelia Magallon Meryl 03/21/2012,10:41 AM

## 2012-03-21 NOTE — Evaluation (Signed)
Occupational Therapy Evaluation Patient Details Name: Eric George MRN: [id number] DOB: [birth year] Today's Date: 03/21/2012 Time: 1308-6578 OT Time Calculation (min): 50 min  OT Assessment / Plan / Recommendation Clinical Impression  Pt admitted with L posterior temporal and parietal CVA.  Pt has a hx of TBI with decreased memory and is HOH.   Pt needs further assessment of cognition during functional activity due to language deficits and hearing impairment. He demonstrates decreased safety awareness and awareness of deficits particularly with mobility.  Will follow acutely to address deficit areas and decrease burden of care on his wife.    OT Assessment  Patient needs continued OT Services    Follow Up Recommendations  Home health OT;Supervision/Assistance - 24 hour    Barriers to Discharge      Equipment Recommendations  Other (comment) (may need tub equipment depending on progress)    Recommendations for Other Services    Frequency  Min 3X/week    Precautions / Restrictions Precautions Precautions: Fall   Pertinent Vitals/Pain No pain reported.    ADL  Eating/Feeding: Independent Where Assessed - Eating/Feeding: Chair Grooming: Wash/dry hands;Min guard Where Assessed - Grooming: Unsupported standing Upper Body Bathing: Supervision/safety Where Assessed - Upper Body Bathing: Unsupported sitting Lower Body Bathing: Set up Where Assessed - Lower Body Bathing: Supported sit to stand Upper Body Dressing: Supervision/safety Where Assessed - Upper Body Dressing: Unsupported sitting Lower Body Dressing: Min guard Where Assessed - Lower Body Dressing: Supported sit to Pharmacist, hospital: Minimal assistance Toilet Transfer Method: Sit to Barista: Comfort height toilet Transfers/Ambulation Related to ADLs: min assist without a device ADL Comments: Pt uses ADL items appropriately.  Able to manipulate packages/containers independently.  Selects  appropriate tool with grooming and eating.      OT Diagnosis: Generalized weakness;Cognitive deficits;Disturbance of vision  OT Problem List: Impaired balance (sitting and/or standing);Impaired vision/perception;Decreased cognition;Decreased safety awareness;Decreased knowledge of use of DME or AE OT Treatment Interventions: Self-care/ADL training;DME and/or AE instruction;Cognitive remediation/compensation;Visual/perceptual remediation/compensation;Patient/family education   OT Goals Acute Rehab OT Goals OT Goal Formulation: With patient Time For Goal Achievement: 04/04/12 Potential to Achieve Goals: Good ADL Goals Pt Will Perform Grooming: with supervision;Standing at sink;Other (comment) (3 activities) ADL Goal: Grooming - Progress: Goal set today Pt Will Perform Lower Body Bathing: with supervision;Standing at sink;Sitting at sink ADL Goal: Lower Body Bathing - Progress: Goal set today Pt Will Perform Lower Body Dressing: with supervision;Sit to stand from bed ADL Goal: Lower Body Dressing - Progress: Goal set today Pt Will Transfer to Toilet: with supervision;Ambulation;Comfort height toilet ADL Goal: Toilet Transfer - Progress: Goal set today Pt Will Perform Toileting - Clothing Manipulation: with supervision;Standing ADL Goal: Toileting - Clothing Manipulation - Progress: Goal set today Pt Will Perform Toileting - Hygiene: Independently;Sitting on 3-in-1 or toilet ADL Goal: Toileting - Hygiene - Progress: Goal set today Pt Will Perform Tub/Shower Transfer: Tub transfer;with supervision;Ambulation;with DME;Other (comment) (determine need for DME) ADL Goal: Tub/Shower Transfer - Progress: Goal set today  Visit Information  Last OT Received On: 03/21/12 Assistance Needed: +1    Subjective Data  Subjective: "What do you want me to do?" Patient Stated Goal: Wife plans to take pt home.   Prior Functioning     Home Living Lives With: Spouse Available Help at Discharge:  Family;Available 24 hours/day Type of Home: House Home Access: Level entry Home Layout: One level Bathroom Shower/Tub: Engineer, manufacturing systems: Handicapped height Bathroom Accessibility: Yes How Accessible: Accessible via walker Home  Adaptive Equipment: None Prior Function Level of Independence: Independent Able to Take Stairs?: Yes Driving: Yes Vocation: Retired Musician: HOH;Expressive difficulties;Other (comment) (difficult to assess receptive language due to hearing) Dominant Hand: Right         Vision/Perception Vision - Assessment Vision Assessment: Vision tested   Cognition  Overall Cognitive Status: Impaired Area of Impairment: Problem solving;Safety/judgement;Memory;Attention Arousal/Alertness: Awake/alert Orientation Level: Disoriented to;Time;Situation Behavior During Session: [facility] for tasks performed Current Attention Level: Selective Attention - Other Comments: somewhat restless, perserverative on drinking juice Memory Deficits: pt with h/o memory deficits from previous craniotomy however patient with further decline at this time Following Commands: Follows one step commands consistently;Other (comment) (difficult to assess due to hearing) Safety/Judgement: Impulsive;Decreased awareness of safety precautions;Decreased awareness of need for assistance Cognition - Other Comments: delayed processing    Extremity/Trunk Assessment Right Upper Extremity Assessment RUE ROM/Strength/Tone: Within functional levels RUE Coordination: WFL - gross/fine motor Left Upper Extremity Assessment LUE ROM/Strength/Tone: Within functional levels LUE Coordination: WFL - gross/fine motor     Mobility Bed Mobility Bed Mobility: Not assessed Transfers Sit to Stand: 4: Min guard;With upper extremity assist;From chair/3-in-1;From toilet Stand to Sit: 4: Min guard;With upper extremity assist;To chair/3-in-1 Details for Transfer Assistance: impulsivity  interfering with safety     Shoulder Instructions     Exercise     Balance     End of Session OT - End of Session Activity Tolerance: Patient tolerated treatment well Patient left: in chair;with call bell/phone within reach;with family/visitor present  GO Functional Assessment Tool Used: clinical judgement Functional Limitation: Self care Self Care Current Status (Z6109): At least 20 percent but less than 40 percent impaired, limited or restricted Self Care Goal Status (U0454): At least 20 percent but less than 40 percent impaired, limited or restricted   Evern Bio 03/21/2012, 12:54 PM [phone]

## 2012-03-21 NOTE — Progress Notes (Addendum)
VASCULAR LAB PRELIMINARY  PRELIMINARY  PRELIMINARY  PRELIMINARY  Carotid duplex completed.    Preliminary report:  Bilateral:  No evidence of hemodynamically significant internal carotid artery stenosis.   Vertebral artery flow is antegrade.     Doron Shake, RVS 03/21/2012, 3:44 PM

## 2012-03-21 NOTE — Evaluation (Signed)
Physical Therapy Evaluation Patient Details Name: Eric George MRN: [id number] DOB: 1930/12/15 Today's Date: 03/21/2012 Time: 0802-0822 PT Time Calculation (min): 20 min  PT Assessment / Plan / Recommendation Clinical Impression  Pt with questionable CVA. Pt with significant cognifive impairments inhibiting safe I mobility. Wife aware if patient remains in this state he will be unable to be left alone while she runs errands. She reports she's going to start talking to their son and neighbors incase his cognition does not improve. Patient with impulsivity, impaired balance, memory deficites, minimal problem solving skills and extremely delayed processing and problem solving. Pt to benefit from acute PT to maximize funcitonal recovery for safe transition home. May need SNF upon d/c if congitive impairments do not resolve and wife can not provide 24/7 supervision.    PT Assessment  Patient needs continued PT services    Follow Up Recommendations  Home health PT;Supervision/Assistance - 24 hour    Does the patient have the potential to tolerate intense rehabilitation      Barriers to Discharge Decreased caregiver support      Equipment Recommendations  None recommended by PT    Recommendations for Other Services     Frequency Min 4X/week    Precautions / Restrictions Precautions Precautions: Fall Restrictions Weight Bearing Restrictions: No   Pertinent Vitals/Pain Pt denies pain      Mobility  Bed Mobility Bed Mobility: Supine to Sit Supine to Sit: 4: Min guard;HOB flat Details for Bed Mobility Assistance: impulsive Transfers Transfers: Sit to Stand;Stand to Sit Sit to Stand: 4: Min guard;With upper extremity assist;From bed Stand to Sit: 4: Min guard;With upper extremity assist;To chair/3-in-1 Details for Transfer Assistance: pt impulsive with minimal processing, decreased safety awareness, ie. locking chair prior to sitting Ambulation/Gait Ambulation/Gait Assistance:  4: Min assist Ambulation Distance (Feet): 200 Feet Assistive device: None Ambulation/Gait Assistance Details: pt mildly unsteady, 1 episode of LOB requiring modA to maintain balance. Pt not phased by his LOB Gait Pattern: Within Functional Limits (occassional scissor episodes) Gait velocity: v/c's to slow down General Gait Details: impulsive Stairs: No Modified Rankin (Stroke Patients Only) Pre-Morbid Rankin Score: No significant disability Modified Rankin: Moderate disability    Shoulder Instructions     Exercises     PT Diagnosis: Difficulty walking  PT Problem List: Decreased balance;Decreased mobility PT Treatment Interventions: Gait training;Functional mobility training   PT Goals Acute Rehab PT Goals PT Goal Formulation: With family Time For Goal Achievement: 03/28/12 Potential to Achieve Goals: Good Pt will go Supine/Side to Sit: Independently;with HOB 0 degrees (with good safety) PT Goal: Supine/Side to Sit - Progress: Goal set today Pt will go Sit to Stand: Independently;with upper extremity assist (with good safety) PT Goal: Sit to Stand - Progress: Goal set today Pt will Stand: Independently;3 - 5 min;with no upper extremity support (for AM adls.) PT Goal: Stand - Progress: Goal set today Pt will Ambulate: >150 feet;Independently (with no device) PT Goal: Ambulate - Progress: Goal set today  Visit Information  Last PT Received On: 03/21/12 Assistance Needed: +1    Subjective Data  Subjective: Pt received supine in bed with confusion. Wife at bedside to provide history.   Prior Functioning  Home Living Lives With: Spouse Available Help at Discharge: Family;Available 24 hours/day (except when wife needs to run errands) Type of Home: House Home Access: Level entry Home Layout: One level Bathroom Shower/Tub: Engineer, manufacturing systems: Handicapped height Bathroom Accessibility: Yes How Accessible: Accessible via walker Home Adaptive Equipment: None  (  wife reports access to cane and RW) Prior Function Level of Independence: Independent Able to Take Stairs?: Yes Driving: Yes Vocation: Retired Musician: Expressive difficulties Dominant Hand: Right    Cognition  Overall Cognitive Status: Impaired Area of Impairment: Memory;Attention;Following commands;Safety/judgement;Problem solving Arousal/Alertness: Awake/alert Orientation Level: Disoriented to;Place;Time;Situation Behavior During Session: WFL for tasks performed Current Attention Level: Selective Attention - Other Comments: easily distracted, perseverates on eating breakfast Memory Deficits: pt with h/o memory deficits from previous craniotomy however patient with further decline at this time Following Commands: Follows one step commands inconsistently Safety/Judgement: Impulsive;Decreased awareness of safety precautions;Decreased awareness of need for assistance Problem Solving: minimal Cognition - Other Comments: delayed processing    Extremity/Trunk Assessment Right Upper Extremity Assessment RUE ROM/Strength/Tone: Within functional levels Left Upper Extremity Assessment LUE ROM/Strength/Tone: Within functional levels Right Lower Extremity Assessment RLE ROM/Strength/Tone: Within functional levels Left Lower Extremity Assessment LLE ROM/Strength/Tone: Within functional levels Trunk Assessment Trunk Assessment: Normal   Balance    End of Session PT - End of Session Equipment Utilized During Treatment: Gait belt Activity Tolerance: Patient tolerated treatment well Patient left: in chair;with call bell/phone within reach;with family/visitor present Nurse Communication: Mobility status  GP Functional Assessment Tool Used: clinical judgment Functional Limitation: Mobility: Walking and moving around Mobility: Walking and Moving Around Current Status (W1191): At least 20 percent but less than 40 percent impaired, limited or restricted Mobility: Walking  and Moving Around Goal Status [phone]): At least 1 percent but less than 20 percent impaired, limited or restricted   Marcene Brawn 03/21/2012, 8:50 AM  Lewis Shock, PT, DPT Pager #: [phone] Office #: [phone]

## 2012-03-22 ENCOUNTER — Encounter (HOSPITAL_COMMUNITY)
Admission: EM | Disposition: A | Discharge status: Home Health | Payer: Self-pay | Source: Home / Self Care | Attending: Internal Medicine

## 2012-03-22 ENCOUNTER — Encounter (HOSPITAL_COMMUNITY): Payer: Self-pay | Admitting: *Deleted

## 2012-03-22 ENCOUNTER — Telehealth: Payer: Self-pay | Admitting: Family Medicine

## 2012-03-22 DIAGNOSIS — I6789 Other cerebrovascular disease: Secondary | ICD-10-CM | POA: Diagnosis not present

## 2012-03-22 DIAGNOSIS — R413 Other amnesia: Secondary | ICD-10-CM | POA: Diagnosis not present

## 2012-03-22 DIAGNOSIS — I10 Essential (primary) hypertension: Secondary | ICD-10-CM | POA: Diagnosis not present

## 2012-03-22 DIAGNOSIS — I6350 Cerebral infarction due to unspecified occlusion or stenosis of unspecified cerebral artery: Secondary | ICD-10-CM | POA: Diagnosis not present

## 2012-03-22 HISTORY — PX: TEE WITHOUT CARDIOVERSION: SHX5443

## 2012-03-22 LAB — C-REACTIVE PROTEIN: CRP: 0.5 mg/dL — ABNORMAL LOW (ref <0.60)

## 2012-03-22 LAB — ANA: Anti Nuclear Antibody(ANA): NEGATIVE

## 2012-03-22 SURGERY — ECHOCARDIOGRAM, TRANSESOPHAGEAL
Anesthesia: Moderate Sedation

## 2012-03-22 MED ORDER — BUTAMBEN-TETRACAINE-BENZOCAINE 2-2-14 % EX AERO
INHALATION_SPRAY | CUTANEOUS | Status: DC | PRN
  Administered 2012-03-22: 2 via TOPICAL

## 2012-03-22 MED ORDER — MIDAZOLAM HCL 5 MG/ML IJ SOLN
INTRAMUSCULAR | Status: AC
  Filled 2012-03-22: qty 3

## 2012-03-22 MED ORDER — LIDOCAINE VISCOUS 2 % MT SOLN
OROMUCOSAL | Status: AC
  Filled 2012-03-22: qty 15

## 2012-03-22 MED ORDER — FENTANYL CITRATE 0.05 MG/ML IJ SOLN
INTRAMUSCULAR | Status: DC | PRN
  Administered 2012-03-22: 50 ug via INTRAVENOUS

## 2012-03-22 MED ORDER — MIDAZOLAM HCL 10 MG/2ML IJ SOLN
INTRAMUSCULAR | Status: DC | PRN
  Administered 2012-03-22: 2 mg via INTRAVENOUS

## 2012-03-22 MED ORDER — FENTANYL CITRATE 0.05 MG/ML IJ SOLN
INTRAMUSCULAR | Status: AC
  Filled 2012-03-22: qty 4

## 2012-03-22 MED ORDER — DIPHENHYDRAMINE HCL 50 MG/ML IJ SOLN
INTRAMUSCULAR | Status: AC
  Filled 2012-03-22: qty 1

## 2012-03-22 MED ORDER — MIDAZOLAM HCL 10 MG/2ML IJ SOLN
INTRAMUSCULAR | Status: DC | PRN
  Administered 2012-03-22: 3 mg via INTRAVENOUS

## 2012-03-22 NOTE — Care Management Note (Signed)
    Page 1 of 2   03/24/2012     12:44:44 PM   CARE MANAGEMENT NOTE 03/24/2012  Patient:  Eric George, Eric George   Account Number:  [id number]  Date Initiated:  03/22/2012  Documentation initiated by:  Jacquelynn Cree  Subjective/Objective Assessment:   Admitted with CVA. Lives with wife.     Action/Plan:   PT/OT/ST evals-recommending HHPT, OT and ST   Anticipated DC Date:  03/23/2012   Anticipated DC Plan:  HOME W HOME HEALTH SERVICES      DC Planning Services  CM consult      Choice offered to / List presented to:  C-3 Spouse   DME arranged  TUB BENCH      DME agency  [facility]     HH arranged  HH-2 PT  HH-3 OT  HH-5 SPEECH THERAPY      HH agency  [facility]   Status of service:  Completed, signed off Medicare Important Message given?   (If response is "NO", the following Medicare IM given date fields will be blank) Date Medicare IM given:   Date Additional Medicare IM given:    Discharge Disposition:  HOME W HOME HEALTH SERVICES  Per UR Regulation:  Reviewed for med. necessity/level of care/duration of stay  If discussed at Long Length of Stay Meetings, dates discussed:    Comments:  11/19/ 36 Spoke with patient and his wife about HHC. Mrs Mastro selected [facility] from the [employer] [facility] agencies. Contacted Anett Ranker at [facility] and requested HHPT, HHOT, and HHST. Order placed for tub bench and contacted Darian at [facility]. Will continue to follow for d/c needs. Jacquelynn Cree RN, BSN, CCM

## 2012-03-22 NOTE — Progress Notes (Signed)
Physical Therapy Treatment Patient Details Name: Eric George MRN: [id number] DOB: [birth year] Today's Date: 03/22/2012 Time: 4098-1191 PT Time Calculation (min): 20 min  PT Assessment / Plan / Recommendation Comments on Treatment Session  Pt's HOH posed difficulty with communication this session. Spoke extensively with spouse re: d/c plan. Recommend 24/7 supervision and HHPT. patient agrees she can not leave him alone at this time but deferred SNF placement. Patients cognitive deficits inhibit safe independent mobilty at this time.    Follow Up Recommendations  Home health PT;Supervision/Assistance - 24 hour     Does the patient have the potential to tolerate intense rehabilitation     Barriers to Discharge        Equipment Recommendations  None recommended by PT    Recommendations for Other Services    Frequency Min 3X/week   Plan Discharge plan remains appropriate;Frequency needs to be updated    Precautions / Restrictions Precautions Precautions: Fall Restrictions Weight Bearing Restrictions: No   Pertinent Vitals/Pain Denies pain    Mobility  Bed Mobility Bed Mobility: Not assessed Transfers Transfers: Sit to Stand;Stand to Sit Sit to Stand: 5: Supervision;With armrests;From chair/3-in-1 Stand to Sit: 5: Supervision;With upper extremity assist;To chair/3-in-1 Ambulation/Gait Ambulation/Gait Assistance: 4: Min guard Ambulation Distance (Feet): 500 Feet Assistive device: None Ambulation/Gait Assistance Details: pt with no LOB despite narrow base of support. pt less impulsive than yesterday but still required v/c's to slow down Gait Pattern: Narrow base of support Gait velocity: v/c's to slow down Modified Rankin (Stroke Patients Only) Pre-Morbid Rankin Score: No significant disability Modified Rankin: Moderate disability    Exercises     PT Diagnosis:    PT Problem List:   PT Treatment Interventions:     PT Goals Acute Rehab PT Goals PT Goal: Sit to  Stand - Progress: Progressing toward goal PT Goal: Stand - Progress: Progressing toward goal PT Goal: Ambulate - Progress: Progressing toward goal  Visit Information  Last PT Received On: 03/22/12 Assistance Needed: +1    Subjective Data  Subjective: Pt received sitting up in chair   Cognition  Overall Cognitive Status: History of cognitive impairments - at baseline Area of Impairment: Memory;Safety/judgement Arousal/Alertness: Awake/alert Behavior During Session: [facility] for tasks performed Memory Deficits: pt with both short term and long deficits PTA however wife reports it's a little worse Safety/Judgement: Impulsive;Decreased awareness of safety precautions;Decreased awareness of need for assistance Safety/Judgement - Other Comments: pt unaware of IV lines    Balance     End of Session PT - End of Session Equipment Utilized During Treatment: Gait belt Activity Tolerance: Patient tolerated treatment well Patient left: in chair;with call bell/phone within reach;with family/visitor present   GP Functional Assessment Tool Used: clinical judgment Functional Limitation: Mobility: Walking and moving around Mobility: Walking and Moving Around Current Status [phone]): At least 20 percent but less than 40 percent impaired, limited or restricted Mobility: Walking and Moving Around Goal Status [phone]): At least 1 percent but less than 20 percent impaired, limited or restricted   Marcene Brawn 03/22/2012, 3:58 PM  Lewis Shock, PT, DPT Pager #: [phone] Office #: [phone]

## 2012-03-22 NOTE — Progress Notes (Signed)
Documented for Eric George, SLP   Apr 12, 2012 1408  SLP G-Codes **NOT FOR INPATIENT CLASS**  Functional Assessment Tool Used skilled clinical judgement  Functional Limitations Spoken language expressive  Spoken Language Expression Current Status [phone]) CM  Spoken Language Expression Goal Status (U0454) CL  SLP Evaluations  $ SLP Speech Visit 1 Procedure  SLP Evaluations  $SLP  Evaluation Tier I 1 Procedure  $Self Care/Home Management 8-22

## 2012-03-22 NOTE — Interval H&P Note (Signed)
History and Physical Interval Note:  03/22/2012 4:34 PM  DEANE WATTENBARGER  has presented today for surgery, with the diagnosis of cva  The various methods of treatment have been discussed with the patient and family. After consideration of risks, benefits and other options for treatment, the patient has consented to  Procedure(s) (LRB) with comments: TRANSESOPHAGEAL ECHOCARDIOGRAM (TEE) (N/A) as a surgical intervention .  The patient's history has been reviewed, patient examined, no change in status, stable for surgery.  I have reviewed the patient's chart and labs.  Questions were answered to the patient's satisfaction.     Daniel Bensimhon

## 2012-03-22 NOTE — Evaluation (Signed)
Speech Language Pathology Evaluation Patient Details Name: Eric George MRN: [id number] DOB: 1931/03/20 Today's Date: 03/22/2012 Time: 1478-2956 SLP Time Calculation (min): 45 min  Problem List:  Patient Active Problem List  Diagnosis  . CARCINOMA, SKIN, SQUAMOUS CELL  . NEOPLASM, SKIN, UNCERTAIN BEHAVIOR  . HYPERLIPIDEMIA  . DEPRESSION  . HYPERTENSION  . RHINITIS  . GERD  . CELLULITIS AND ABSCESS OF UPPER ARM AND FOREARM  . SKIN LESION  . MEMORY LOSS  . DYSGEUSIA  . PROSTATE CANCER, HX OF  . COLONIC POLYPS, HX OF  . Small bowel obstruction  . Nausea & vomiting  . Abdominal pain  . Hypertension  . CVA (cerebral infarction)   Past Medical History:  Past Medical History  Diagnosis Date  . Bowel obstruction   . Hydrocephalus   . TBI (traumatic brain injury)   . Colloid cyst of brain   . GERD (gastroesophageal reflux disease)   . Hypertension   . CARCINOMA, SKIN, SQUAMOUS CELL 10/28/2009   Past Surgical History:  Past Surgical History  Procedure Date  . Hernia repair   . Prostatectomy   . Brain surgery    HPI:  78 year old male admitted with acute left posterior temporal and parietal ischemic CVAs.  Speech-language evaluation ordered 11/18 to assess expressive and receptive language abilities.   Assessment / Plan / Recommendation Clinical Impression  Patient presents with mixed aphasia and cognitive deficits characterized by decreased intellectual awareness and recall of basic information.   Wife reports baseline memory deficits which have gotten worse with this CVA.  Patient exhibited impaired confrontational naming with semantic and phonemic paraphasias, and inconsistent awareness of errors and difficulty self-correcting during verbal expression, which SLP suspects to be exacerbated by [facility] status.  SLP facilitated session with written aids and max assist verbal and visual cues for repetition which improved patient's accuracy and ability to self-correct, and  initiated education with patient and spouse about utilizing written aids to facilitate improved comprehension of basic information.  Overall, patient's receptive language appears to be more intact than expressive.  Patient would benefit from skilled SLP services for cognition, receptive and expressive communication in order to maximize functional independence and reduce burden of care upon discharge.      SLP Assessment  Patient needs continued Speech Lanaguage Pathology Services    Follow Up Recommendations  Home health SLP;24 hour supervision/assistance    Frequency and Duration min 2x/week  2 weeks   Pertinent Vitals/Pain N/A   SLP Goals  SLP Goals Potential to Achieve Goals: Good Potential Considerations: Ability to learn/carryover information;Co-morbidities;Previous level of function;Other (comment) (HOH status) Progress/Goals/Alternative treatment plan discussed with pt/caregiver and they: Agree SLP Goal #1: Patient will utilize external aids to facilitate recall of daily, new information with mod-max assist verbal and visual cues.   SLP Goal #2: Patient will demonstrate improved intellectual awareness by identifying 2 deficits that occurred as a result of his stroke with max assist multimodal cues.   SLP Goal #3: Patient's caregiver will utilize written aids to facilitate improved comprehension and compensate for hearing impairments during functional conversations with min assist.   SLP Goal #4: Patient will self-monitor and correct errors during confrontation naming tasks with max assist multimodal cuing.    SLP Evaluation Prior Functioning  Cognitive/Linguistic Baseline: Baseline deficits Baseline deficit details: residual memory deficits from previous TBI, worse now per wife report Type of Home: House Lives With: Spouse Available Help at Discharge: Family;Available 24 hours/day Vocation: Retired   IT consultant  Overall  Cognitive Status: Impaired at baseline Arousal/Alertness:  Awake/alert Orientation Level: Oriented X4 Attention: Selective Selective Attention: Appears intact Memory: Impaired Memory Impairment: Decreased recall of new information;Decreased short term memory Decreased Short Term Memory: Verbal basic;Functional basic Awareness: Impaired (pt with decreased awareness he had stroke, per wife report) Awareness Impairment: Intellectual impairment Executive Function: Self Correcting (difficulty correcting errors during verbal expression) Self Correcting: Impaired Self Correcting Impairment: Verbal basic    Comprehension  Auditory Comprehension Overall Auditory Comprehension: Other (comment) (intact with written basic, HOH status impacts w/ auditory  ) Yes/No Questions: Within Functional Limits Commands: Within Functional Limits Conversation: Simple Other Conversation Comments: HOH status makes comprehension of verbal input difficult, written aids and visual cues proved helpful during functional conversations Interfering Components: Hearing;Processing speed;Working Radio broadcast assistant: Extra processing time;Increased volume;Pausing;Slowed speech;Stressing words;Visual/Gestural cues Visual Recognition/Discrimination Discrimination: Within Function Limits Reading Comprehension Reading Status: Within funtional limits    Expression Expression Primary Mode of Expression: Verbal Verbal Expression Overall Verbal Expression: Impaired Initiation: No impairment Level of Generative/Spontaneous Verbalization: Sentence;Phrase Repetition: No impairment (word level basic) Naming: Impairment (semantic and phonemic paraphasias) Responsive: 76-100% accurate (with written aids) Confrontation: Impaired Other Naming Comments: inconsistent awareness of errors with difficulty self correcting Verbal Errors: Semantic paraphasias;Phonemic paraphasias Pragmatics: No impairment Interfering Components: Premorbid deficit (HOH) Non-Verbal Means of Communication:  Not applicable Written Expression Dominant Hand: Right Written Expression: Within Functional Limits (with biographical basic)   Oral / Motor Oral Motor/Sensory Function Overall Oral Motor/Sensory Function: Appears within functional limits for tasks assessed Motor Speech Overall Motor Speech: Appears within functional limits for tasks assessed      Jackalyn Lombard, Conrad Charlotte  Graduate Clinician Speech Language Pathology  Page, Joni Reining 03/22/2012, 11:32 AM  The above assessment and plan has been reviewed and SLP is in agreement. Fae Pippin, M.A., CCC-SLP [phone]

## 2012-03-22 NOTE — Progress Notes (Addendum)
[facility] PROGRESS NOTE  TYCEN DOCKTER ZOX:[id number] DOB: Dec 06, 1930 DOA: 03/20/2012 PCP: Kristian Covey, MD  Assessment/Plan: Principal Problem:  *CVA (cerebral infarction) -Acute left posterior temporal and parietal ischemic CVA: Associated with expressive >receptive aphasia. - continue ASA -TEE today 11/19 with PFO, and dopplers of LE odered per neuro- follow and further treat accordingly - appreciate neuro assistance  Active Problems:  HYPERLIPIDEMIA  -continue lipitor HYPERTENSION  -continue avapro, allow for permissive hypertension -monitor and further treat as appropriate GERD  -continue PPI   Code Status : full Family Communication: son at bedside Disposition Plan: home when medically ready   Consultants:  neuro  Procedures: Carotid duplex completed.  Preliminary report: Bilateral: No evidence of hemodynamically significant internal carotid artery stenosis. Vertebral artery flow is antegrade.   ECHO Study Conclusions  - Procedure narrative: Transthoracic echocardiography. Image quality was adequate. The study was technically difficult. - Left ventricle: The cavity size was normal. There was mild focal basal hypertrophy of the septum. Systolic function was normal. The estimated ejection fraction was in the range of 60% to 65%. Wall motion was normal; there were no regional wall motion abnormalities. - Aortic valve: Mild regurgitation. TEE FINDINGS:  LEFT VENTRICLE: EF = 55-60% No regional wall motion abnormalities.   RIGHT VENTRICLE: Normal  LEFT ATRIUM: Dilated.  LEFT ATRIAL APPENDAGE: Small. No clot.  RIGHT ATRIUM: Normal  AORTIC VALVE: Trileaflet. Mildly calcified. Mild AI. No AS.  MITRAL VALVE: Minimal prolapse of anterior leaflet. Mild central MR.  TRICUSPID VALVE: Normal. No TR.  PULMONIC VALVE: No well visualized.  INTERATRIAL SEPTUM: Bows from L to R indicative of high left-sided pressures. Small PFO visualized by color Doppler.  Bubble study negative.  PERICARDIUM: No effusion  DESCENDING AORTA: Moderate to severe plaque with several prominent focal plaques.          Antibiotics:  none  HPI/Subjective: States Speech about the same- more expressive deficits than receptive. Denies new c/o. Wife at bedside  Objective: Filed Vitals:   03/22/12 1750 03/22/12 1755 03/22/12 1828 03/22/12 2148  BP: 154/128  187/86 167/69  Pulse: 52  62 60  Temp:   97.4 F (36.3 C) 97.5 F (36.4 C)  TempSrc:    Oral  Resp: 14  20 18   Height:      Weight:      SpO2: 91% 95% 98% 99%    Intake/Output Summary (Last 24 hours) at 03/22/12 2259 Last data filed at 03/22/12 1755  Gross per 24 hour  Intake   1225 ml  Output      0 ml  Net   1225 ml   Filed Weights   03/20/12 2001  Weight: 84.596 kg (186 lb 8 oz)    Exam:   General: alert and oriented x1  Cardiovascular: RRR nl S1S2  Respiratory: clear  Abdomen: soft, +BS NT/ND  Neuro: stregth 5/5 and symmetric  Data Reviewed: Basic Metabolic Panel:  Lab 03/21/12 8119 03/20/12 1111  NA -- 137  K -- 4.3  CL -- 104  CO2 -- 20  GLUCOSE -- 101*  BUN -- 19  CREATININE -- 1.01  CALCIUM -- 9.3  MG 2.0 --  PHOS -- --   Liver Function Tests:  Lab 03/20/12 1111  AST 16  ALT 13  ALKPHOS 90  BILITOT 0.4  PROT 6.7  ALBUMIN 3.8   No results found for this basename: LIPASE:5,AMYLASE:5 in the last 168 hours No results found for this basename: AMMONIA:5 in the last 168 hours CBC:  Lab 03/20/12 1111  WBC 8.0  NEUTROABS 5.4  HGB 15.8  HCT 44.9  MCV 81.9  PLT 202   Cardiac Enzymes:  Lab 03/20/12 1111  CKTOTAL --  CKMB --  CKMBINDEX --  TROPONINI <0.30   BNP (last 3 results) No results found for this basename: PROBNP:3 in the last 8760 hours CBG:  Lab 03/20/12 1112  GLUCAP 102*    No results found for this or any previous visit (from the past 240 hour(s)).   Studies: No results found.  Scheduled Meds:    . aspirin  325 mg Oral Daily   . atorvastatin  20 mg Oral Daily  . enoxaparin  40 mg Subcutaneous Q24H  . irbesartan  75 mg Oral Daily  . pantoprazole  40 mg Oral Daily  . sertraline  50 mg Oral Daily   Continuous Infusions:    . sodium chloride 500 mL (03/22/12 1542)    Principal Problem:  *CVA (cerebral infarction) Active Problems:  HYPERLIPIDEMIA  HYPERTENSION  GERD  Nausea & vomiting  Hypertension    Time spent:65mins  LATE ENTRY: PT SEEN AT 1:30PM TODAY 11/19  [facility]  [facility] Pager [phone]. If 8PM-8AM, please contact night-coverage at [email or web address], password [facility] 03/22/2012, 10:59 PM  LOS: 2 days

## 2012-03-22 NOTE — H&P (View-Only) (Signed)
[facility] PROGRESS NOTE  Eric George UEA:[id number] DOB: Jan 24, 1931 DOA: 03/20/2012 PCP: Kristian Covey, MD  Assessment/Plan: Principal Problem:  *CVA (cerebral infarction) -Acute left posterior temporal and parietal ischemic CVA: Associated with expressive >receptive aphasia. - continue ASA -discussed with Neuro, TEE in am - appreciate neuro assistance - carotid dopplers ned for ica stenosis, await echo results Active Problems:  HYPERLIPIDEMIA  -continue lipitor HYPERTENSION  -continue avapro, allow for permissive hypertension GERD  -continue PPI   Code Status : full Family Communication: son at bedside Disposition Plan: home when medically ready   Consultants:  neuro  Procedures: Carotid duplex completed.  Preliminary report: Bilateral: No evidence of hemodynamically significant internal carotid artery stenosis. Vertebral artery flow is antegrade.    Antibiotics:  none  HPI/Subjective: Speech about the same- more expressive deficits than receptive. Denies new c/o  Objective: Filed Vitals:   03/21/12 0209 03/21/12 0415 03/21/12 0526 03/21/12 1857  BP: 164/86 149/80 155/75 156/78  Pulse: 86 88 97 76  Temp: 98.2 F (36.8 C) 98.2 F (36.8 C) 98.2 F (36.8 C) 98.3 F (36.8 C)  TempSrc: Oral Oral Oral Oral  Resp: 15 16 16 18   Height:      Weight:      SpO2: 100% 100% 100% 99%    Intake/Output Summary (Last 24 hours) at 03/21/12 2111 Last data filed at 03/21/12 0600  Gross per 24 hour  Intake    810 ml  Output      0 ml  Net    810 ml   Filed Weights   03/20/12 2001  Weight: 84.596 kg (186 lb 8 oz)    Exam:   General: alert and oriented x1  Cardiovascular: RRR nl S1S2  Respiratory: clear  Abdomen: soft, +BS NT/ND  Neuro: stregth 5/5 and symmetric  Data Reviewed: Basic Metabolic Panel:  Lab 03/21/12 4782 03/20/12 1111  NA -- 137  K -- 4.3  CL -- 104  CO2 -- 20  GLUCOSE -- 101*  BUN -- 19  CREATININE -- 1.01    CALCIUM -- 9.3  MG 2.0 --  PHOS -- --   Liver Function Tests:  Lab 03/20/12 1111  AST 16  ALT 13  ALKPHOS 90  BILITOT 0.4  PROT 6.7  ALBUMIN 3.8   No results found for this basename: LIPASE:5,AMYLASE:5 in the last 168 hours No results found for this basename: AMMONIA:5 in the last 168 hours CBC:  Lab 03/20/12 1111  WBC 8.0  NEUTROABS 5.4  HGB 15.8  HCT 44.9  MCV 81.9  PLT 202   Cardiac Enzymes:  Lab 03/20/12 1111  CKTOTAL --  CKMB --  CKMBINDEX --  TROPONINI <0.30   BNP (last 3 results) No results found for this basename: PROBNP:3 in the last 8760 hours CBG:  Lab 03/20/12 1112  GLUCAP 102*    No results found for this or any previous visit (from the past 240 hour(s)).   Studies: Dg Chest 2 View  03/20/2012  *RADIOLOGY REPORT*  Clinical Data: Altered mental status.  CHEST - 2 VIEW  Comparison: Chest x-ray 04/27/2007.  Findings: Lung volumes are normal.  No consolidative airspace disease.  No pleural effusions.  No pneumothorax.  No pulmonary nodule or mass noted.  Pulmonary vasculature and the cardiomediastinal silhouette are within normal limits. Atherosclerotic calcifications are noted within the arch of the aorta.  IMPRESSION: 1. No radiographic evidence of acute cardiopulmonary disease. 2.  Atherosclerosis.   Original Report Authenticated By: Trudie Reed, M.D.  Ct Head (brain) Wo Contrast  03/20/2012  *RADIOLOGY REPORT*  Clinical Data: Altered mental status.  Slurred speech and confusion with left-sided facial droop.  Symptoms have now improved, possible TIA.  CT HEAD WITHOUT CONTRAST  Technique:  Contiguous axial images were obtained from the base of the skull through the vertex without contrast.  Comparison: 01/03/2006.  Findings: Encephalomalacia in the right frontal lobe is unchanged. Mild cerebral atrophy.  Patchy and confluent areas of decreased attenuation throughout the deep and periventricular white matter of the cerebral hemispheres bilaterally,  compatible with chronic microvascular ischemic disease.  No definite area to suggest acute/subacute cerebral ischemia, no evidence of acute intracerebral hemorrhage, no focal mass, mass effect, hydrocephalus or abnormal intra or extra-axial fluid collections.  Status post right frontal craniotomy.  No acute displaced skull fractures are identified.  Visualized paranasal sinuses and mastoids are remarkable for extensive areas of mucosal thickening throughout the ethmoid sinuses bilaterally.  No air fluid levels are identified.  IMPRESSION: 1.  No acute intracranial abnormalities. 2.  The appearance of the brain is very similar to prior examination 01/03/2006, with exception of increasing chronic ischemic changes in the cerebral white matter, as above. 3.  Extensive mucosal thickening throughout the ethmoid sinuses bilaterally suggesting chronic sinusitis.   Original Report Authenticated By: Trudie Reed, M.D.    Mr [facility] Wo Contrast  03/20/2012  *RADIOLOGY REPORT*  Clinical Data:   Slurred speech.  Confusion.  High blood pressure. History of traumatic brain injury, high blood pressure and colon cyst surgery  MRI BRAIN WITHOUT CONTRAST MRA HEAD WITHOUT CONTRAST  Technique: Multiplanar, multiecho pulse sequences of the brain and surrounding structures were obtained according to standard protocol without intravenous contrast.  Angiographic images of the head were obtained using MRA technique without contrast.  Comparison: 03/20/2012 head CT.  No comparison brain MR.  MRI HEAD  Findings:  Posterior left temporal - parietal lobe acute non hemorrhagic infarct extending to the posterior opercular region.  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  Small vessel disease type changes.  Global atrophy without hydrocephalus.  No intracranial hemorrhage.  No intracranial mass lesion detected on this unenhanced exam.   Degenerative changes C1-2 articulation.  Partially empty sella incidentally noted.  Paranasal sinus mucosal thickening.  Mild exophthalmos.  IMPRESSION:  Posterior left temporal - parietal lobe acute non hemorrhagic infarct.  Please see above.  MRA HEAD  Findings: Motion degraded exam.  Evaluating for aneurysm or grading stenosis is limited given the degree of motion.  All that can be stated with certainty is that there is flow within portions of the; internal carotid arteries, carotid terminus bilaterally, M1 segment of the middle cerebral arteries bilaterally, portions of the middle cerebral artery branches bilaterally, anterior cerebral artery bilaterally, vertebral arteries, basilar artery, posterior cerebral arteries and superior cerebral arteries.  IMPRESSION:  Motion degraded exam.  Evaluating for aneurysm or grading stenosis is limited given the degree of motion.  Please see above.  Critical Value/emergent results were called by telephone at the time of interpretation on 03/20/2012 at  1:25 p.m. to Dr. Fonnie Jarvis, who verbally acknowledged these results.   Original Report Authenticated By: Lacy Duverney, M.D.    Mr Brain Wo Contrast  03/20/2012  *RADIOLOGY REPORT*  Clinical Data:   Slurred speech.  Confusion.  High blood pressure. History of traumatic brain injury, high blood pressure and colon cyst surgery  MRI BRAIN WITHOUT CONTRAST  MRA HEAD WITHOUT CONTRAST  Technique: Multiplanar, multiecho pulse sequences of the brain and surrounding structures were obtained according to standard protocol without intravenous contrast.  Angiographic images of the head were obtained using MRA technique without contrast.  Comparison: 03/20/2012 head CT.  No comparison brain MR.  MRI HEAD  Findings:  Posterior left temporal - parietal lobe acute non hemorrhagic infarct extending to the posterior opercular region.  Remote right frontal lobe surgery for resection of colloid cyst with encephalomalacia right frontal lobe and  mild dilation right frontal horn.  Small vessel disease type changes.  Global atrophy without hydrocephalus.  No intracranial hemorrhage.  No intracranial mass lesion detected on this unenhanced exam.  Degenerative changes C1-2 articulation.  Partially empty sella incidentally noted.  Paranasal sinus mucosal thickening.  Mild exophthalmos.  IMPRESSION:  Posterior left temporal - parietal lobe acute non hemorrhagic infarct.  Please see above.  MRA HEAD  Findings: Motion degraded exam.  Evaluating for aneurysm or grading stenosis is limited given the degree of motion.  All that can be stated with certainty is that there is flow within portions of the; internal carotid arteries, carotid terminus bilaterally, M1 segment of the middle cerebral arteries bilaterally, portions of the middle cerebral artery branches bilaterally, anterior cerebral artery bilaterally, vertebral arteries, basilar artery, posterior cerebral arteries and superior cerebral arteries.  IMPRESSION:  Motion degraded exam.  Evaluating for aneurysm or grading stenosis is limited given the degree of motion.  Please see above.  Critical Value/emergent results were called by telephone at the time of interpretation on 03/20/2012 at  1:25 p.m. to Dr. Fonnie Jarvis, who verbally acknowledged these results.   Original Report Authenticated By: Lacy Duverney, M.D.     Scheduled Meds:   . aspirin  325 mg Oral Daily  . atorvastatin  20 mg Oral Daily  . enoxaparin  40 mg Subcutaneous Q24H  . irbesartan  75 mg Oral Daily  . pantoprazole  40 mg Oral Daily  . sertraline  50 mg Oral Daily  . [DISCONTINUED] aspirin  300 mg Rectal Daily   Continuous Infusions:   . sodium chloride 75 mL/hr at 03/21/12 2043    Principal Problem:  *CVA (cerebral infarction) Active Problems:  HYPERLIPIDEMIA  HYPERTENSION  GERD  Nausea & vomiting  Hypertension    Time spent:21mins    Kela Millin  [facility] Pager [phone]. If 8PM-8AM, please contact  night-coverage at [email or web address], password [facility] 03/21/2012, 9:11 PM  LOS: 1 day

## 2012-03-22 NOTE — Telephone Encounter (Signed)
Pt's wife calling to let Dr. Caryl Never know that pt is currently at [facility] due to stroke he had on Sunday.  Pt is able to walk and use arm.  However, still having problems with his speech.

## 2012-03-22 NOTE — Progress Notes (Signed)
Echocardiogram Echocardiogram Transesophageal has been performed.  Torsten Weniger 03/22/2012, 5:11 PM

## 2012-03-22 NOTE — Progress Notes (Signed)
Note TEE positive for small PFO. Bilateral lower extremity dopplers ordered to rule out DVT as source of stroke.  Annie Main, MSN, RN, ANVP-BC, ANP-BC, GNP-BC [facility] Pager: [phone] 03/22/2012 5:57 PM

## 2012-03-22 NOTE — Progress Notes (Signed)
Occupational Therapy Treatment Patient Details Name: Eric George MRN: [id number] DOB: [birth year] Today's Date: 03/22/2012 Time: 4098-1191 OT Time Calculation (min): 24 min   Follow Up Recommendations    P"t will need a tub seat with a back                  Precautions / Restrictions Precautions Precautions: Other (comment);Fall (pt HOH and presents with aphasia)       ADL  Tub/Shower Transfer: Performed;Minimal assistance;Other (comment) (with tub seat in rehab gym) Tub/Shower Transfer Method: Ambulating Tub/Shower Transfer Equipment: Shower seat with back Transfers/Ambulation Related to ADLs: Pt did read for OT.  Pt occasionally did miss a word or read a wrong word, but overall did well ADL Comments: Pts hearing aids not working this day which increased challenge of communicating with patient      OT Goals ADL Goals ADL Goal: Tub/Shower Transfer - Progress: Other (comment) (pt does need a tub seat with a back)  Visit Information  Last OT Received On: 03/22/12    Subjective Data  Subjective: what are we doing?   Prior Functioning  Home Living Lives With: Spouse Available Help at Discharge: Family;Available 24 hours/day Type of Home: House Prior Function Vocation: Retired Dominant Hand: Right    Cognition  Arousal/Alertness: Systems analyst Transfers Transfers: Sit to Stand;Stand to Sit Sit to Stand: 4: Min guard Stand to Sit: 4: Min guard             End of Session OT - End of Session Activity Tolerance: Patient tolerated treatment well Patient left: in chair;with call bell/phone within reach;with family/visitor present  GO Functional Assessment Tool Used: clinical judgement Functional Limitation: Self care Self Care Current Status (Y7829): At least 20 percent but less than 40 percent impaired, limited or restricted Self Care Goal Status (F6213): At least 1 percent but less than 20 percent impaired, limited or  restricted   Krithi Bray, [place]ung 03/22/2012, 12:23 PM
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
## 2012-03-22 NOTE — CV Procedure (Signed)
    TRANSESOPHAGEAL ECHOCARDIOGRAM   NAME:  Eric George   MRN: [id number] DOB:  [birth year]   ADMIT DATE: 03/20/2012  INDICATIONS: CVA   PROCEDURE:   Informed consent was obtained prior to the procedure. The risks, benefits and alternatives for the procedure were discussed and the patient comprehended these risks.  Risks include, but are not limited to, cough, sore throat, vomiting, nausea, somnolence, esophageal and stomach trauma or perforation, bleeding, low blood pressure, aspiration, pneumonia, infection, trauma to the teeth and death.    After a procedural time-out, the patient was given 5 mg versed and 50 mcg fentanyl for moderate sedation.  The oropharynx was anesthetized with cetacaine spray.  The transesophageal probe was inserted in the esophagus and stomach without difficulty and multiple views were obtained.   Agitated microbubble saline contrast was not administered.  COMPLICATIONS:    There were no immediate complications.  FINDINGS:  LEFT VENTRICLE: EF = 55-60% No regional wall motion abnormalities.  RIGHT VENTRICLE: Normal  LEFT ATRIUM: Dilated.  LEFT ATRIAL APPENDAGE: Small. No clot.  RIGHT ATRIUM: Normal  AORTIC VALVE:  Trileaflet. Mildly calcified. Mild AI. No AS.  MITRAL VALVE:    Minimal prolapse of anterior leaflet. Mild central MR.  TRICUSPID VALVE: Normal. No TR.   PULMONIC VALVE: No well visualized.  INTERATRIAL SEPTUM: Bows from L to R indicative of high left-sided pressures. Small PFO visualized by color Doppler. Bubble study negative.   PERICARDIUM: No effusion  DESCENDING AORTA: Moderate to severe plaque with several prominent focal plaques.

## 2012-03-23 ENCOUNTER — Encounter (HOSPITAL_COMMUNITY): Payer: Self-pay

## 2012-03-23 ENCOUNTER — Encounter (HOSPITAL_COMMUNITY): Payer: Self-pay | Admitting: Internal Medicine

## 2012-03-23 DIAGNOSIS — Z87820 Personal history of traumatic brain injury: Secondary | ICD-10-CM | POA: Diagnosis not present

## 2012-03-23 DIAGNOSIS — K219 Gastro-esophageal reflux disease without esophagitis: Secondary | ICD-10-CM | POA: Diagnosis present

## 2012-03-23 DIAGNOSIS — I6350 Cerebral infarction due to unspecified occlusion or stenosis of unspecified cerebral artery: Secondary | ICD-10-CM | POA: Diagnosis not present

## 2012-03-23 DIAGNOSIS — I10 Essential (primary) hypertension: Secondary | ICD-10-CM | POA: Diagnosis not present

## 2012-03-23 DIAGNOSIS — R4701 Aphasia: Secondary | ICD-10-CM | POA: Diagnosis present

## 2012-03-23 DIAGNOSIS — H5340 Unspecified visual field defects: Secondary | ICD-10-CM | POA: Diagnosis present

## 2012-03-23 DIAGNOSIS — Z87891 Personal history of nicotine dependence: Secondary | ICD-10-CM | POA: Diagnosis not present

## 2012-03-23 DIAGNOSIS — Z79899 Other long term (current) drug therapy: Secondary | ICD-10-CM | POA: Diagnosis not present

## 2012-03-23 DIAGNOSIS — Z85828 Personal history of other malignant neoplasm of skin: Secondary | ICD-10-CM | POA: Diagnosis not present

## 2012-03-23 DIAGNOSIS — R4789 Other speech disturbances: Secondary | ICD-10-CM | POA: Diagnosis not present

## 2012-03-23 DIAGNOSIS — R233 Spontaneous ecchymoses: Secondary | ICD-10-CM | POA: Diagnosis not present

## 2012-03-23 DIAGNOSIS — L539 Erythematous condition, unspecified: Secondary | ICD-10-CM | POA: Diagnosis not present

## 2012-03-23 DIAGNOSIS — F3289 Other specified depressive episodes: Secondary | ICD-10-CM | POA: Diagnosis not present

## 2012-03-23 DIAGNOSIS — E785 Hyperlipidemia, unspecified: Secondary | ICD-10-CM | POA: Diagnosis not present

## 2012-03-23 DIAGNOSIS — F329 Major depressive disorder, single episode, unspecified: Secondary | ICD-10-CM | POA: Diagnosis not present

## 2012-03-23 DIAGNOSIS — I6340 Cerebral infarction due to embolism of unspecified cerebral artery: Secondary | ICD-10-CM | POA: Diagnosis present

## 2012-03-23 MED ORDER — ATORVASTATIN CALCIUM 40 MG PO TABS
40.0000 mg | ORAL_TABLET | Freq: Every day | ORAL | Status: DC
  Filled 2012-03-23: qty 1

## 2012-03-23 MED ORDER — ATORVASTATIN CALCIUM 40 MG PO TABS: 40.0000 mg | ORAL_TABLET | Freq: Every day | ORAL | Status: DC

## 2012-03-23 MED ORDER — ASPIRIN 325 MG PO TABS: 325.0000 mg | ORAL_TABLET | Freq: Every day | ORAL | Status: DC
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
## 2012-03-23 NOTE — Discharge Summary (Signed)
Physician Discharge Summary  Eric George WUJ:[id number] DOB: [birth year] DOA: 03/20/2012  PCP: Kristian Covey, MD  Admit date: 03/20/2012 Discharge date: 03/23/2012  Time spent: 60 minutes  Recommendations for Outpatient Follow-up:  1. Followup with PCP one week post discharge. 2. Followup with Dr. Pearlean Brownie of [facility] neurology in 2 months.  Discharge Diagnoses:  Principal Problem:  *CVA (cerebral infarction) Active Problems:  HYPERLIPIDEMIA  HYPERTENSION  GERD  Nausea & vomiting  Hypertension   Discharge Condition: Stable and improved  Diet recommendation: Heart healthy  Filed Weights   03/20/12 2001  Weight: 84.596 kg (186 lb 8 oz)    History of present illness:  Eric George is a 78 y.o. male with history of hypertension, hyperlipidemia, traumatic brain injury, brain surgery for colloid cyst, residual memory deficits presented to ED on 11/17 with speech abnormalities noticed by his spouse. Patient is unable to provide any history secondary to speech difficulties. History is obtained from spouse. He was apparently last seen in his usual state of health last night. Between 8:30 to 9 AM on 11/17, patient showered, shaved and came to the kitchen, open cabinet and took his Zegerid. He was then trying to open a box of cereal and had difficulty. He told his wife that he was not feeling well and went to sit down. His wife then noticed that he was not responding appropriately to questions and intermittently was having nonsensical and slurred speech. She did not notice obvious facial asymmetry or patient leaning to any one side. He complained of mild dizziness but no headache or chest pain. In the ED, CT head showed no acute abnormality but MRI showed findings consistent with acute ischemic infarction involving left posterior temporal and left parietal region. He was not a candidate for thrombolytic therapy because he was out of the timeframe for such. He could not pass the bedside  R.N. swallow screen. She has received rectal aspirin. The hospitalist service is requested to admit for further evaluation and management. Neurology has consulted on patient.      Hospital Course:  #1 acute left posterior temporal and parietal ischemic CVA Patient was admitted with an expressive aphasia per imaging. Head CT which was done was negative. MRI which was done was consistent with an acute ischemic infarction involving the left posterior temporal and left parietal region. It was felt to be embolic in nature. Patient was admitted to the telemetry floor and placed on aspirin for secondary stroke prevention. Patient neurology consultation was obtained. The stroke workup was undertaken including carotid Dopplers which were negative for any significant ICA stenosis. Her ANA sedimentation rate and CRP were also obtained. A TEE was also done by the bowel a cardiology which was negative metabolic source however did have a PFO and a negative bubble study. Neurology was consulted and patient was seen in consultation by Dr. Marjory Lies on 03/21/2012. Patient was deemed to be not a TPA candidate secondary to delay in arrival. Due to PFO noted on TEE bilateral lower extremity Dopplers were obtained to rule out DVT and came back negative. Fasting lipid panel which was obtained did show LDL of 90. Patient was maintained on a statin during the hospitalization and was discharged on a statin. Patient remained in stable condition and improved it was felt that patient needed to followup with a dermatologist as outpatient to followup on the skin lesions on his lower extremity. Patient is to followup with neurology in about 4-6 weeks. Patient was discharged home in stable and improved  condition. It was felt patient also needed to followup with ophthalmology as outpatient for visual test.  There is a patient chronic medical issues remained stable throughout the hospitalization and patient was discharged in stable and  improved condition.  Procedures:  Carotid Dopplers 03/21/2012  TEE 03/22/2012  Bilateral lower extremity Dopplers 03/23/2012  MRI MRA of the head 03/20/2012  CT of the head 03/20/2012  Chest x-ray 03/20/2012  Consultations:  Neurology: Dr. Marjory Lies 03/21/2012  Discharge Exam: Filed Vitals:   03/23/12 0200 03/23/12 0653 03/23/12 1000 03/23/12 1345  BP: 176/92 161/82 145/71 157/84  Pulse: 78 64 81 80  Temp: 97.9 F (36.6 C) 97.8 F (36.6 C) 98.4 F (36.9 C) 98 F (36.7 C)  TempSrc: Oral Oral Oral Oral  Resp: 18 20 18 18   Height:      Weight:      SpO2: 98% 98% 95% 94%    General: NAD Cardiovascular: RRR Respiratory: CTAB  Discharge Instructions  Discharge Orders    Future Appointments: Provider: Department: Dept Phone: Center:   04/25/2012 9:00 AM Kristian Covey, MD [facility] [place] [phone] [facility]     Future Orders Please Complete By Expires   Diet - low sodium heart healthy      Increase activity slowly      Discharge instructions      Comments:   Followup with PCP one week post discharge. Followup with ophthalmology in one to 2 weeks. Followup with dermatologist to evaluate lower extremity lesions in 1-2 weeks. Followup with Dr. Pearlean Brownie, [facility] neurology in 2 months. Followup with her cardiologist as outpatient for possible monitor to rule out A. fib.       Medication List     As of 03/23/2012  3:17 PM    TAKE these medications         ALIGN 4 MG Caps   Take 1 capsule by mouth daily.      aspirin 325 MG tablet   Take 1 tablet (325 mg total) by mouth daily.      atorvastatin 40 MG tablet   Commonly known as: LIPITOR   Take 1 tablet (40 mg total) by mouth daily at 6 PM.      irbesartan 75 MG tablet   Commonly known as: AVAPRO   Take 75 mg by mouth daily.      omeprazole-sodium bicarbonate 40-1100 MG per capsule   Commonly known as: ZEGERID   Take 1 capsule by mouth 2 (two) times daily.      sertraline 50  MG tablet   Commonly known as: ZOLOFT   Take 50 mg by mouth daily.          The results of significant diagnostics from this hospitalization (including imaging, microbiology, ancillary and laboratory) are listed below for reference.    Significant Diagnostic Studies: Dg Chest 2 View  03/20/2012  *RADIOLOGY REPORT*  Clinical Data: Altered mental status.  CHEST - 2 VIEW  Comparison: Chest x-ray 04/27/2007.  Findings: Lung volumes are normal.  No consolidative airspace disease.  No pleural effusions.  No pneumothorax.  No pulmonary nodule or mass noted.  Pulmonary vasculature and the cardiomediastinal silhouette are within normal limits. Atherosclerotic calcifications are noted within the arch of the aorta.  IMPRESSION: 1. No radiographic evidence of acute cardiopulmonary disease. 2.  Atherosclerosis.   Original Report Authenticated By: Trudie Reed, M.D.    Ct Head (brain) Wo Contrast  03/20/2012  *RADIOLOGY REPORT*  Clinical Data: Altered mental status.  Slurred speech  and confusion with left-sided facial droop.  Symptoms have now improved, possible TIA.  CT HEAD WITHOUT CONTRAST  Technique:  Contiguous axial images were obtained from the base of the skull through the vertex without contrast.  Comparison: 01/03/2006.  Findings: Encephalomalacia in the right frontal lobe is unchanged. Mild cerebral atrophy.  Patchy and confluent areas of decreased attenuation throughout the deep and periventricular white matter of the cerebral hemispheres bilaterally, compatible with chronic microvascular ischemic disease.  No definite area to suggest acute/subacute cerebral ischemia, no evidence of acute intracerebral hemorrhage, no focal mass, mass effect, hydrocephalus or abnormal intra or extra-axial fluid collections.  Status post right frontal craniotomy.  No acute displaced skull fractures are identified.  Visualized paranasal sinuses and mastoids are remarkable for extensive areas of mucosal thickening  throughout the ethmoid sinuses bilaterally.  No air fluid levels are identified.  IMPRESSION: 1.  No acute intracranial abnormalities. 2.  The appearance of the brain is very similar to prior examination 01/03/2006, with exception of increasing chronic ischemic changes in the cerebral white matter, as above. 3.  Extensive mucosal thickening throughout the ethmoid sinuses bilaterally suggesting chronic sinusitis.   Original Report Authenticated By: Trudie Reed, M.D.    Mr [facility] Wo Contrast  03/20/2012  *RADIOLOGY REPORT*  Clinical Data:   Slurred speech.  Confusion.  High blood pressure. History of traumatic brain injury, high blood pressure and colon cyst surgery  MRI BRAIN WITHOUT CONTRAST MRA HEAD WITHOUT CONTRAST  Technique: Multiplanar, multiecho pulse sequences of the brain and surrounding structures were obtained according to standard protocol without intravenous contrast.  Angiographic images of the head were obtained using MRA technique without contrast.  Comparison: 03/20/2012 head CT.  No comparison brain MR.  MRI HEAD  Findings:  Posterior left temporal - parietal lobe acute non hemorrhagic infarct extending to the posterior opercular region.  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  Small vessel disease type changes.  Global atrophy without hydrocephalus.  No intracranial hemorrhage.  No intracranial mass lesion detected on this unenhanced exam.  Degenerative changes C1-2 articulation.  Partially empty sella incidentally noted.  Paranasal sinus mucosal thickening.  Mild exophthalmos.  IMPRESSION:  Posterior left temporal - parietal lobe acute non hemorrhagic infarct.  Please see above.  MRA HEAD  Findings: Motion degraded exam.  Evaluating for aneurysm or grading stenosis is limited given the degree of motion.  All that can be stated with certainty is that there is flow within portions of  the; internal carotid arteries, carotid terminus bilaterally, M1 segment of the middle cerebral arteries bilaterally, portions of the middle cerebral artery branches bilaterally, anterior cerebral artery bilaterally, vertebral arteries, basilar artery, posterior cerebral arteries and superior cerebral arteries.  IMPRESSION:  Motion degraded exam.  Evaluating for aneurysm or grading stenosis is limited given the degree of motion.  Please see above.  Critical Value/emergent results were called by telephone at the time of interpretation on 03/20/2012 at  1:25 p.m. to Dr. Fonnie Jarvis, who verbally acknowledged these results.   Original Report Authenticated By: Lacy Duverney, M.D.    Mr Brain Wo Contrast  03/20/2012  *RADIOLOGY REPORT*  Clinical Data:   Slurred speech.  Confusion.  High blood pressure. History of traumatic brain injury, high blood pressure and colon cyst surgery  MRI BRAIN WITHOUT CONTRAST MRA HEAD WITHOUT CONTRAST  Technique: Multiplanar, multiecho pulse sequences of the brain and surrounding structures were obtained according  to standard protocol without intravenous contrast.  Angiographic images of the head were obtained using MRA technique without contrast.  Comparison: 03/20/2012 head CT.  No comparison brain MR.  MRI HEAD  Findings:  Posterior left temporal - parietal lobe acute non hemorrhagic infarct extending to the posterior opercular region.  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  Small vessel disease type changes.  Global atrophy without hydrocephalus.  No intracranial hemorrhage.  No intracranial mass lesion detected on this unenhanced exam.  Degenerative changes C1-2 articulation.  Partially empty sella incidentally noted.  Paranasal sinus mucosal thickening.  Mild exophthalmos.  IMPRESSION:  Posterior left temporal - parietal lobe acute non hemorrhagic infarct.  Please see above.  MRA  HEAD  Findings: Motion degraded exam.  Evaluating for aneurysm or grading stenosis is limited given the degree of motion.  All that can be stated with certainty is that there is flow within portions of the; internal carotid arteries, carotid terminus bilaterally, M1 segment of the middle cerebral arteries bilaterally, portions of the middle cerebral artery branches bilaterally, anterior cerebral artery bilaterally, vertebral arteries, basilar artery, posterior cerebral arteries and superior cerebral arteries.  IMPRESSION:  Motion degraded exam.  Evaluating for aneurysm or grading stenosis is limited given the degree of motion.  Please see above.  Critical Value/emergent results were called by telephone at the time of interpretation on 03/20/2012 at  1:25 p.m. to Dr. Fonnie Jarvis, who verbally acknowledged these results.   Original Report Authenticated By: Lacy Duverney, M.D.     Microbiology: No results found for this or any previous visit (from the past 240 hour(s)).   Labs: Basic Metabolic Panel:  Lab 03/21/12 4540 03/20/12 1111  NA -- 137  K -- 4.3  CL -- 104  CO2 -- 20  GLUCOSE -- 101*  BUN -- 19  CREATININE -- 1.01  CALCIUM -- 9.3  MG 2.0 --  PHOS -- --   Liver Function Tests:  Lab 03/20/12 1111  AST 16  ALT 13  ALKPHOS 90  BILITOT 0.4  PROT 6.7  ALBUMIN 3.8   No results found for this basename: LIPASE:5,AMYLASE:5 in the last 168 hours No results found for this basename: AMMONIA:5 in the last 168 hours CBC:  Lab 03/20/12 1111  WBC 8.0  NEUTROABS 5.4  HGB 15.8  HCT 44.9  MCV 81.9  PLT 202   Cardiac Enzymes:  Lab 03/20/12 1111  CKTOTAL --  CKMB --  CKMBINDEX --  TROPONINI <0.30   BNP: BNP (last 3 results) No results found for this basename: PROBNP:3 in the last 8760 hours CBG:  Lab 03/20/12 1112  GLUCAP 102*       Signed:  Nyiesha Beever  [facility] 03/23/2012, 3:17 PM

## 2012-03-23 NOTE — Progress Notes (Signed)
*  PRELIMINARY RESULTS* Vascular Ultrasound Lower extremity venous duplex has been completed.  Preliminary findings: Bilateral:  No evidence of DVT, superficial thrombosis, or Baker's Cyst.   Farrel Demark, RDMS, RVT 03/23/2012, 11:30 AM

## 2012-03-23 NOTE — Plan of Care (Signed)
Problem: Food- and Nutrition-Related Knowledge Deficit (NB-1.1) Goal: Nutrition education Formal process to instruct or train a patient/client in a skill or to impart knowledge to help patients/clients voluntarily manage or modify food choices and eating behavior to maintain or improve health.  Outcome: Completed/Met Date Met:  03/23/12 Nutrition Education Note  RD consulted for nutrition education regarding a Heart Healthy diet.  Pt overall diet healthy. Pt does consume high fat/high sodium meats at lunch every day. We discussed alternative to try. Pt somewhat receptive but wants to maintain his quality of life at age 78.  Lipid Panel     Component Value Date/Time    CHOL 145 03/21/2012 0545    TRIG 103 03/21/2012 0545    HDL 34* 03/21/2012 0545    CHOLHDL 4.3 03/21/2012 0545    VLDL 21 03/21/2012 0545    LDLCALC 90 03/21/2012 0545    RD provided "Heart Healthy Nutrition Therapy" handout from the Academy of [employer]. Reviewed patient's dietary recall. Provided examples on ways to decrease sodium and fat intake in diet. Discouraged intake of processed foods and use of salt shaker. Encouraged fresh fruits and vegetables as well as whole grain sources of carbohydrates to maximize fiber intake.  Expect fair compliance.  Body mass index is 23.31 kg/(m^2). BMI WNL.  Current diet order is Heart Healthy, patient is consuming approximately 100% of meals at this time. Labs and medications reviewed. No further nutrition interventions warranted at this time. RD contact information provided. If additional nutrition issues arise, please re-consult RD.  Kendell Bane RD, LDN, CNSC [phone] Pager [phone] After Hours Pager

## 2012-03-25 ENCOUNTER — Encounter: Payer: Self-pay | Admitting: Family Medicine

## 2012-03-25 ENCOUNTER — Ambulatory Visit (INDEPENDENT_AMBULATORY_CARE_PROVIDER_SITE_OTHER): Payer: Medicare Other | Admitting: Family Medicine

## 2012-03-25 VITALS — BP 160/88 | Temp 97.4°F | Wt 194.0 lb

## 2012-03-25 DIAGNOSIS — I10 Essential (primary) hypertension: Secondary | ICD-10-CM

## 2012-03-25 DIAGNOSIS — I6350 Cerebral infarction due to unspecified occlusion or stenosis of unspecified cerebral artery: Secondary | ICD-10-CM

## 2012-03-25 DIAGNOSIS — I639 Cerebral infarction, unspecified: Secondary | ICD-10-CM

## 2012-03-25 NOTE — Progress Notes (Signed)
Subjective:     Patient ID: Eric George, male   DOB: [birth year], 78 y.o.   MRN: [id number]  HPI Hospital follow-up for L temporal-parietal CVA.  HPI per pt and his wife.  Pt developed slurred speech and an expressive aphasia on Sunday and was transported to the [facility] via EMS.  MRI showed acute non-hemorrhagic infarct of left temporal-parietal region.  No hemiparesis or sensory deficits noted.  R visual field deficit was also noted in workup.  Small PFO shown on TEE, but LE Dopplers showed no sign of DVT, superficial thrombosis or Baker's cyst.  Pt has been started on aspirin 325mg  QD, and his atorvastatin was increased from 20mg  to 40mg .  Hospital team recommended event monitor for 3 weeks to assess for atrial fibrillation.  Since discharge, pt reports that he has been feeling well.  Has not noticed any hemiparesis, sensory deficits, or headaches.  His wife reports that he still exhibits some expressive aphasia with using the correct word   Review of Systems  Constitutional: Negative for fever and fatigue.  Respiratory: Negative for chest tightness and shortness of breath.   Cardiovascular: Negative for chest pain and leg swelling.  Neurological: Positive for speech difficulty (some expressive aphasia). Negative for dizziness, facial asymmetry, weakness, numbness and headaches.       Objective:   Physical Exam  Constitutional: He is oriented to person, place, and time. He appears well-developed and well-nourished. No distress.  Cardiovascular: Normal rate, regular rhythm and normal heart sounds.   Pulmonary/Chest: Effort normal and breath sounds normal.  Neurological: He is alert and oriented to person, place, and time. He has normal reflexes. He exhibits normal muscle tone. Coordination normal.       UE and LE strength 5/5 bilaterally with flexion and extension.       Assessment:     Hospital follow-up after ischemic L temporal-parietal CVA.    Plan:     1. CVA followup: pt appears  to be recovering well and exhibits no motor or coordination deficits, and his aphasia, while still present per his wife, is much improved compared to the onset of his CVA.  Continue aspirin 325mg  QD for now, follow up for any symptoms of GI bleed.  Also continue atorvastatin 40mg .  [facility] will follow for PT/OT/speech therapy.  Referral made for opthalmology with Dr. Elmer George for the R visual field deficit pt experienced in hospital.  Referral also in for event monitor to assess pt for a fib. 2. Hypertension: noted to be high today, initially 160/88, but improved on re-check to 138/90.  Pt's wife notes that it has been high since hospitalization.  Continue to monitor for now, as aggressive management might be dangerous this soon post-CVA.  If BPs at home remain consistently above 140/90 through next week, follow up and med increase may be warranted at that time.  Eric George, MS3     Agree with assessment and plan as per Eric Schiller, MS 3 Eric Peat MD

## 2012-03-27 DIAGNOSIS — R5381 Other malaise: Secondary | ICD-10-CM | POA: Diagnosis not present

## 2012-03-27 DIAGNOSIS — Z5189 Encounter for other specified aftercare: Secondary | ICD-10-CM | POA: Diagnosis not present

## 2012-03-27 DIAGNOSIS — R269 Unspecified abnormalities of gait and mobility: Secondary | ICD-10-CM | POA: Diagnosis not present

## 2012-03-27 DIAGNOSIS — I10 Essential (primary) hypertension: Secondary | ICD-10-CM | POA: Diagnosis not present

## 2012-03-27 DIAGNOSIS — I69992 Facial weakness following unspecified cerebrovascular disease: Secondary | ICD-10-CM | POA: Diagnosis not present

## 2012-03-27 DIAGNOSIS — I69920 Aphasia following unspecified cerebrovascular disease: Secondary | ICD-10-CM | POA: Diagnosis not present

## 2012-03-27 DIAGNOSIS — R5383 Other fatigue: Secondary | ICD-10-CM | POA: Diagnosis not present

## 2012-03-28 ENCOUNTER — Telehealth: Payer: Self-pay | Admitting: Family Medicine

## 2012-03-28 DIAGNOSIS — I69920 Aphasia following unspecified cerebrovascular disease: Secondary | ICD-10-CM | POA: Diagnosis not present

## 2012-03-28 DIAGNOSIS — Z5189 Encounter for other specified aftercare: Secondary | ICD-10-CM | POA: Diagnosis not present

## 2012-03-28 DIAGNOSIS — I10 Essential (primary) hypertension: Secondary | ICD-10-CM | POA: Diagnosis not present

## 2012-03-28 DIAGNOSIS — R5381 Other malaise: Secondary | ICD-10-CM | POA: Diagnosis not present

## 2012-03-28 DIAGNOSIS — R269 Unspecified abnormalities of gait and mobility: Secondary | ICD-10-CM | POA: Diagnosis not present

## 2012-03-28 DIAGNOSIS — I639 Cerebral infarction, unspecified: Secondary | ICD-10-CM

## 2012-03-28 DIAGNOSIS — I69992 Facial weakness following unspecified cerebrovascular disease: Secondary | ICD-10-CM | POA: Diagnosis not present

## 2012-03-28 NOTE — Telephone Encounter (Signed)
[facility] called and said that pt was discharged from hosp. Pt has been set up for home health starting this week.

## 2012-03-28 NOTE — Telephone Encounter (Signed)
FYI

## 2012-03-28 NOTE — Telephone Encounter (Signed)
Can referral please be entered for [facility]

## 2012-03-28 NOTE — Telephone Encounter (Signed)
Pt has been evaluated and [facility] Nurse feels he will not qualify for home health much longer and would like pt to get set up for out pt rehab at [facility] before he needs to have rehab in all areas

## 2012-03-28 NOTE — Telephone Encounter (Signed)
OK to set up outpt rehab-[facility].

## 2012-03-30 DIAGNOSIS — I69992 Facial weakness following unspecified cerebrovascular disease: Secondary | ICD-10-CM | POA: Diagnosis not present

## 2012-03-30 DIAGNOSIS — Z5189 Encounter for other specified aftercare: Secondary | ICD-10-CM | POA: Diagnosis not present

## 2012-03-30 DIAGNOSIS — R269 Unspecified abnormalities of gait and mobility: Secondary | ICD-10-CM | POA: Diagnosis not present

## 2012-03-30 DIAGNOSIS — I10 Essential (primary) hypertension: Secondary | ICD-10-CM | POA: Diagnosis not present

## 2012-03-30 DIAGNOSIS — I69920 Aphasia following unspecified cerebrovascular disease: Secondary | ICD-10-CM | POA: Diagnosis not present

## 2012-03-30 DIAGNOSIS — R5381 Other malaise: Secondary | ICD-10-CM | POA: Diagnosis not present

## 2012-04-01 DIAGNOSIS — R269 Unspecified abnormalities of gait and mobility: Secondary | ICD-10-CM | POA: Diagnosis not present

## 2012-04-01 DIAGNOSIS — I69992 Facial weakness following unspecified cerebrovascular disease: Secondary | ICD-10-CM | POA: Diagnosis not present

## 2012-04-01 DIAGNOSIS — I69920 Aphasia following unspecified cerebrovascular disease: Secondary | ICD-10-CM | POA: Diagnosis not present

## 2012-04-01 DIAGNOSIS — Z5189 Encounter for other specified aftercare: Secondary | ICD-10-CM | POA: Diagnosis not present

## 2012-04-01 DIAGNOSIS — I10 Essential (primary) hypertension: Secondary | ICD-10-CM | POA: Diagnosis not present

## 2012-04-01 DIAGNOSIS — R5381 Other malaise: Secondary | ICD-10-CM | POA: Diagnosis not present

## 2012-04-04 ENCOUNTER — Other Ambulatory Visit: Payer: Self-pay | Admitting: *Deleted

## 2012-04-04 DIAGNOSIS — I69992 Facial weakness following unspecified cerebrovascular disease: Secondary | ICD-10-CM | POA: Diagnosis not present

## 2012-04-04 DIAGNOSIS — R5381 Other malaise: Secondary | ICD-10-CM | POA: Diagnosis not present

## 2012-04-04 DIAGNOSIS — I10 Essential (primary) hypertension: Secondary | ICD-10-CM | POA: Diagnosis not present

## 2012-04-04 DIAGNOSIS — I639 Cerebral infarction, unspecified: Secondary | ICD-10-CM

## 2012-04-04 DIAGNOSIS — Z5189 Encounter for other specified aftercare: Secondary | ICD-10-CM | POA: Diagnosis not present

## 2012-04-04 DIAGNOSIS — R269 Unspecified abnormalities of gait and mobility: Secondary | ICD-10-CM | POA: Diagnosis not present

## 2012-04-04 DIAGNOSIS — R5383 Other fatigue: Secondary | ICD-10-CM | POA: Diagnosis not present

## 2012-04-04 DIAGNOSIS — I69920 Aphasia following unspecified cerebrovascular disease: Secondary | ICD-10-CM | POA: Diagnosis not present

## 2012-04-05 DIAGNOSIS — R5383 Other fatigue: Secondary | ICD-10-CM | POA: Diagnosis not present

## 2012-04-05 DIAGNOSIS — R269 Unspecified abnormalities of gait and mobility: Secondary | ICD-10-CM | POA: Diagnosis not present

## 2012-04-05 DIAGNOSIS — I69992 Facial weakness following unspecified cerebrovascular disease: Secondary | ICD-10-CM | POA: Diagnosis not present

## 2012-04-05 DIAGNOSIS — I10 Essential (primary) hypertension: Secondary | ICD-10-CM | POA: Diagnosis not present

## 2012-04-05 DIAGNOSIS — I69920 Aphasia following unspecified cerebrovascular disease: Secondary | ICD-10-CM | POA: Diagnosis not present

## 2012-04-05 DIAGNOSIS — R5381 Other malaise: Secondary | ICD-10-CM | POA: Diagnosis not present

## 2012-04-05 DIAGNOSIS — Z5189 Encounter for other specified aftercare: Secondary | ICD-10-CM | POA: Diagnosis not present

## 2012-04-07 ENCOUNTER — Telehealth: Payer: Self-pay | Admitting: Family Medicine

## 2012-04-07 ENCOUNTER — Ambulatory Visit: Payer: Medicare Other | Attending: Family Medicine | Admitting: Occupational Therapy

## 2012-04-07 DIAGNOSIS — Z5189 Encounter for other specified aftercare: Secondary | ICD-10-CM | POA: Diagnosis not present

## 2012-04-07 DIAGNOSIS — H353190 Nonexudative age-related macular degeneration, unspecified eye, stage unspecified: Secondary | ICD-10-CM | POA: Diagnosis not present

## 2012-04-07 DIAGNOSIS — I69919 Unspecified symptoms and signs involving cognitive functions following unspecified cerebrovascular disease: Secondary | ICD-10-CM | POA: Diagnosis not present

## 2012-04-07 DIAGNOSIS — I69920 Aphasia following unspecified cerebrovascular disease: Secondary | ICD-10-CM | POA: Insufficient documentation

## 2012-04-07 DIAGNOSIS — H35039 Hypertensive retinopathy, unspecified eye: Secondary | ICD-10-CM | POA: Diagnosis not present

## 2012-04-07 DIAGNOSIS — H2510 Age-related nuclear cataract, unspecified eye: Secondary | ICD-10-CM | POA: Diagnosis not present

## 2012-04-07 NOTE — Telephone Encounter (Signed)
Should be OK to take this dose of Vit E.  BP readings reviewed.  Several (most) are adequate controlled.  Continue to monitor at least 3-4 times/week and be in touch if consistently > 140/90

## 2012-04-07 NOTE — Telephone Encounter (Signed)
Pts spouse called and said that she dropped off a copy of bp readings from 03/23/12 thru 04/04/12 to [facility]. Has not gotten any response from Dr Caryl Never. Would like to make sure that readings were rcvd. Pts bp has come done since Monday. Pt saw Dr Elmer Picker this morning, an opthamologist. Was found that pt had lite to moderate macular degeneration in both eyes. The doctor has recommended pt take vitamin supplement for the eyes. One of them contains Vitamin E 200 IU which is a blood thinner and pt is already on aspirin. Pls call.

## 2012-04-08 NOTE — Telephone Encounter (Signed)
Pt wife informed, will scan BP readings to EMR.

## 2012-04-11 ENCOUNTER — Encounter (INDEPENDENT_AMBULATORY_CARE_PROVIDER_SITE_OTHER): Payer: Medicare Other

## 2012-04-11 DIAGNOSIS — I4891 Unspecified atrial fibrillation: Secondary | ICD-10-CM

## 2012-04-11 DIAGNOSIS — I639 Cerebral infarction, unspecified: Secondary | ICD-10-CM

## 2012-04-13 ENCOUNTER — Ambulatory Visit: Payer: Medicare Other | Admitting: *Deleted

## 2012-04-13 ENCOUNTER — Ambulatory Visit: Payer: Medicare Other | Admitting: Speech Pathology

## 2012-04-13 DIAGNOSIS — I69919 Unspecified symptoms and signs involving cognitive functions following unspecified cerebrovascular disease: Secondary | ICD-10-CM | POA: Diagnosis not present

## 2012-04-13 DIAGNOSIS — I69920 Aphasia following unspecified cerebrovascular disease: Secondary | ICD-10-CM | POA: Diagnosis not present

## 2012-04-13 DIAGNOSIS — Z5189 Encounter for other specified aftercare: Secondary | ICD-10-CM | POA: Diagnosis not present

## 2012-04-15 ENCOUNTER — Ambulatory Visit: Payer: Medicare Other

## 2012-04-15 DIAGNOSIS — I69920 Aphasia following unspecified cerebrovascular disease: Secondary | ICD-10-CM | POA: Diagnosis not present

## 2012-04-15 DIAGNOSIS — Z5189 Encounter for other specified aftercare: Secondary | ICD-10-CM | POA: Diagnosis not present

## 2012-04-15 DIAGNOSIS — I69919 Unspecified symptoms and signs involving cognitive functions following unspecified cerebrovascular disease: Secondary | ICD-10-CM | POA: Diagnosis not present

## 2012-04-18 DIAGNOSIS — I69920 Aphasia following unspecified cerebrovascular disease: Secondary | ICD-10-CM | POA: Diagnosis not present

## 2012-04-18 DIAGNOSIS — Z5189 Encounter for other specified aftercare: Secondary | ICD-10-CM | POA: Diagnosis not present

## 2012-04-18 DIAGNOSIS — I69992 Facial weakness following unspecified cerebrovascular disease: Secondary | ICD-10-CM | POA: Diagnosis not present

## 2012-04-18 DIAGNOSIS — R269 Unspecified abnormalities of gait and mobility: Secondary | ICD-10-CM | POA: Diagnosis not present

## 2012-04-19 ENCOUNTER — Ambulatory Visit: Payer: Medicare Other

## 2012-04-19 DIAGNOSIS — Z5189 Encounter for other specified aftercare: Secondary | ICD-10-CM | POA: Diagnosis not present

## 2012-04-19 DIAGNOSIS — I69920 Aphasia following unspecified cerebrovascular disease: Secondary | ICD-10-CM | POA: Diagnosis not present

## 2012-04-19 DIAGNOSIS — I69919 Unspecified symptoms and signs involving cognitive functions following unspecified cerebrovascular disease: Secondary | ICD-10-CM | POA: Diagnosis not present

## 2012-04-22 ENCOUNTER — Ambulatory Visit: Payer: Medicare Other

## 2012-04-22 DIAGNOSIS — I69920 Aphasia following unspecified cerebrovascular disease: Secondary | ICD-10-CM | POA: Diagnosis not present

## 2012-04-22 DIAGNOSIS — I69919 Unspecified symptoms and signs involving cognitive functions following unspecified cerebrovascular disease: Secondary | ICD-10-CM | POA: Diagnosis not present

## 2012-04-22 DIAGNOSIS — Z5189 Encounter for other specified aftercare: Secondary | ICD-10-CM | POA: Diagnosis not present

## 2012-04-25 ENCOUNTER — Encounter: Payer: Self-pay | Admitting: Family Medicine

## 2012-04-25 ENCOUNTER — Ambulatory Visit (INDEPENDENT_AMBULATORY_CARE_PROVIDER_SITE_OTHER): Payer: Medicare Other | Admitting: Family Medicine

## 2012-04-25 VITALS — BP 118/70 | Temp 97.3°F | Wt 192.0 lb

## 2012-04-25 DIAGNOSIS — I6350 Cerebral infarction due to unspecified occlusion or stenosis of unspecified cerebral artery: Secondary | ICD-10-CM

## 2012-04-25 DIAGNOSIS — I10 Essential (primary) hypertension: Secondary | ICD-10-CM

## 2012-04-25 DIAGNOSIS — K219 Gastro-esophageal reflux disease without esophagitis: Secondary | ICD-10-CM

## 2012-04-25 DIAGNOSIS — Z0000 Encounter for general adult medical examination without abnormal findings: Secondary | ICD-10-CM | POA: Diagnosis not present

## 2012-04-25 DIAGNOSIS — R5381 Other malaise: Secondary | ICD-10-CM | POA: Diagnosis not present

## 2012-04-25 DIAGNOSIS — F329 Major depressive disorder, single episode, unspecified: Secondary | ICD-10-CM

## 2012-04-25 DIAGNOSIS — H3530 Unspecified macular degeneration: Secondary | ICD-10-CM | POA: Insufficient documentation

## 2012-04-25 DIAGNOSIS — E785 Hyperlipidemia, unspecified: Secondary | ICD-10-CM

## 2012-04-25 DIAGNOSIS — F09 Unspecified mental disorder due to known physiological condition: Secondary | ICD-10-CM

## 2012-04-25 DIAGNOSIS — I639 Cerebral infarction, unspecified: Secondary | ICD-10-CM

## 2012-04-25 DIAGNOSIS — R413 Other amnesia: Secondary | ICD-10-CM

## 2012-04-25 DIAGNOSIS — R5383 Other fatigue: Secondary | ICD-10-CM | POA: Diagnosis not present

## 2012-04-25 DIAGNOSIS — R4189 Other symptoms and signs involving cognitive functions and awareness: Secondary | ICD-10-CM

## 2012-04-25 LAB — VITAMIN B12: Vitamin B-12: 690 pg/mL (ref 211–911)

## 2012-04-25 LAB — TSH: TSH: 2.76 u[IU]/mL (ref 0.35–5.50)

## 2012-04-25 NOTE — Progress Notes (Signed)
Subjective:    Patient ID: Eric George, male    DOB: [birth year], 78 y.o.   MRN: [id number]  HPI Patient here for Medicare wellness exam and for medical followup. Recent CVA which was left temporal parietal nonhemorrhagic. Refer to prior note. He had expressive aphasia which is about the same. He is seeing speech therapy. He denied any focal weakness. Wife is concerned because of some short-term memory loss. He has some increased fatigue. Holter monitor for 3 weeks rule out any atrial fibrillation. Patient remains on aspirin.  No recent falls. Patient denies depressive symptoms. Has past history depression stable on sertraline 50 mg daily.  chronic hearing loss with bilateral hearing aids which are stable.  Wife relates that he has restless leg twitching frequently at night. This is more bothersome for her than for him. He has long history of GERD as had some recent exacerbation of symptoms. Wife states ophthalmologist diagnosed macular degeneration started him on vitamins and she thinks this is a factor. He is taking a full aspirin. No hematemesis. No appetite or weight changes.  Past Medical History  Diagnosis Date  . Bowel obstruction   . Hydrocephalus   . TBI (traumatic brain injury)   . Colloid cyst of brain   . GERD (gastroesophageal reflux disease)   . Hypertension   . CARCINOMA, SKIN, SQUAMOUS CELL 10/28/2009   Past Surgical History  Procedure Date  . Hernia repair   . Prostatectomy   . Brain surgery   . Tee without cardioversion 03/22/2012    Procedure: TRANSESOPHAGEAL ECHOCARDIOGRAM (TEE);  Surgeon: Dolores Patty, MD;  Location: [facility] ENDOSCOPY;  Service: Cardiovascular;  Laterality: N/A;    reports that he quit smoking about 45 years ago. His smoking use included Cigarettes. He has a 7.5 pack-year smoking history. He does not have any smokeless tobacco history on file. He reports that he does not drink alcohol or use illicit drugs. family history includes Heart disease in  his sister. Allergies  Allergen Reactions  . Penicillins Rash   1.  Risk factors based on Past Medical , Social, and Family history as above 2.  Limitations in physical activities walks frequently for exercise 3.  Depression/mood history recurrent depression currently stable on sertraline 4.  Hearing chronic hearing loss with hearing aids 5.  ADLs independent in all 6.  Cognitive function (orientation to time and place, language, writing, speech,memory) long-term memory intact. Short-term slightly impaired. Two out of three word recall. Oriented to day, year, and month but not date. 7.  Home Safety no issues 8.  Height, weight, and visual acuity. Stable. Regular followup with ophthalmology with recent diagnosis macular degeneration. 9.  Counseling counseled regarding GERD and reducing fall risk. 10. Recommendation of preventive services. Immunizations up to date. Colonoscopy up to date 11. Labs based on risk factors TSH and B12 12. Care Plan as above.    Review of Systems  Constitutional: Positive for fatigue. Negative for fever, activity change, appetite change and unexpected weight change.  HENT: Negative for ear pain, congestion and trouble swallowing.   Eyes: Negative for pain and visual disturbance.  Respiratory: Negative for cough, shortness of breath and wheezing.   Cardiovascular: Negative for chest pain and palpitations.  Gastrointestinal: Negative for nausea, vomiting, abdominal pain, diarrhea, constipation, blood in stool, abdominal distention and rectal pain.  Genitourinary: Negative for dysuria, hematuria and testicular pain.  Musculoskeletal: Negative for joint swelling and arthralgias.  Skin: Negative for rash.  Neurological: Negative for dizziness, syncope and headaches.  Hematological: Negative for adenopathy.  Psychiatric/Behavioral: Negative for dysphoric mood and agitation.       Objective:   Physical Exam  Constitutional: He is oriented to person, place, and  time. He appears well-developed and well-nourished. No distress.  HENT:  Head: Normocephalic and atraumatic.  Right Ear: External ear normal.  Left Ear: External ear normal.  Mouth/Throat: Oropharynx is clear and moist.  Eyes: Conjunctivae normal and EOM are normal. Pupils are equal, round, and reactive to light.  Neck: Normal range of motion. Neck supple. No thyromegaly present.  Cardiovascular: Normal rate, regular rhythm and normal heart sounds.   No murmur heard. Pulmonary/Chest: No respiratory distress. He has no wheezes. He has no rales.  Abdominal: Soft. Bowel sounds are normal. He exhibits no distension and no mass. There is no tenderness. There is no rebound and no guarding.  Musculoskeletal: He exhibits no edema.  Lymphadenopathy:    He has no cervical adenopathy.  Neurological: He is alert and oriented to person, place, and time. He displays normal reflexes. No cranial nerve deficit.  Skin: No rash noted.  Psychiatric: He has a normal mood and affect.          Assessment & Plan:  #1 health maintenance. Immunizations up-to-date. Colonoscopy up to date. We discussed PSA testing and given his age and multiple comorbidities we have not recommended any further PSA testing at this time  #2 GERD. Occasional breakthrough symptoms. Avoid multivitamins for the next week to see if this makes a difference. Discussed reflux precautions #3 history recent CVA. Expressive aphasia. Stable. Continue speech therapy  #4 hypertension. Stable. Continue current medications  #5 hyperlipidemia. Recent increase in Lipitor to 40 mg and tolerating well.  #6 mild cognitive impairment. Check TSH and B12

## 2012-04-26 NOTE — Progress Notes (Signed)
Quick Note:  Pt wife informed ______ 

## 2012-05-03 ENCOUNTER — Ambulatory Visit: Payer: Medicare Other | Attending: Family Medicine

## 2012-05-03 DIAGNOSIS — I69920 Aphasia following unspecified cerebrovascular disease: Secondary | ICD-10-CM | POA: Insufficient documentation

## 2012-05-03 DIAGNOSIS — IMO0001 Reserved for inherently not codable concepts without codable children: Secondary | ICD-10-CM | POA: Diagnosis not present

## 2012-05-06 ENCOUNTER — Ambulatory Visit: Payer: Medicare Other | Attending: Family Medicine

## 2012-05-06 DIAGNOSIS — Z5189 Encounter for other specified aftercare: Secondary | ICD-10-CM | POA: Insufficient documentation

## 2012-05-06 DIAGNOSIS — I69920 Aphasia following unspecified cerebrovascular disease: Secondary | ICD-10-CM | POA: Insufficient documentation

## 2012-05-06 DIAGNOSIS — I69919 Unspecified symptoms and signs involving cognitive functions following unspecified cerebrovascular disease: Secondary | ICD-10-CM | POA: Insufficient documentation

## 2012-05-10 ENCOUNTER — Other Ambulatory Visit: Payer: Self-pay | Admitting: *Deleted

## 2012-05-10 MED ORDER — ATORVASTATIN CALCIUM 40 MG PO TABS: 40.0000 mg | ORAL_TABLET | Freq: Every day | ORAL | Status: DC

## 2012-05-11 ENCOUNTER — Ambulatory Visit: Payer: Medicare Other

## 2012-05-12 ENCOUNTER — Telehealth: Payer: Self-pay | Admitting: Family Medicine

## 2012-05-12 MED ORDER — ATORVASTATIN CALCIUM 40 MG PO TABS: 40.0000 mg | ORAL_TABLET | Freq: Every day | ORAL | Status: DC

## 2012-05-12 NOTE — Telephone Encounter (Signed)
Patient's spouse called stating that [facility] that they did not receive the rx for lipitor 40 mg. Please resend.

## 2012-05-13 ENCOUNTER — Ambulatory Visit: Payer: Medicare Other

## 2012-05-18 ENCOUNTER — Ambulatory Visit: Payer: Medicare Other

## 2012-05-20 ENCOUNTER — Ambulatory Visit: Payer: Medicare Other

## 2012-05-25 ENCOUNTER — Ambulatory Visit: Payer: Medicare Other | Admitting: Speech Pathology

## 2012-05-26 DIAGNOSIS — R4701 Aphasia: Secondary | ICD-10-CM | POA: Diagnosis not present

## 2012-05-26 DIAGNOSIS — I6350 Cerebral infarction due to unspecified occlusion or stenosis of unspecified cerebral artery: Secondary | ICD-10-CM | POA: Diagnosis not present

## 2012-05-27 ENCOUNTER — Ambulatory Visit: Payer: Medicare Other | Admitting: Speech Pathology

## 2012-06-01 ENCOUNTER — Ambulatory Visit: Payer: Medicare Other

## 2012-06-03 ENCOUNTER — Ambulatory Visit: Payer: Medicare Other

## 2012-06-08 ENCOUNTER — Ambulatory Visit: Payer: Medicare Other | Attending: Family Medicine

## 2012-06-08 DIAGNOSIS — I69919 Unspecified symptoms and signs involving cognitive functions following unspecified cerebrovascular disease: Secondary | ICD-10-CM | POA: Insufficient documentation

## 2012-06-08 DIAGNOSIS — Z5189 Encounter for other specified aftercare: Secondary | ICD-10-CM | POA: Insufficient documentation

## 2012-06-08 DIAGNOSIS — I69920 Aphasia following unspecified cerebrovascular disease: Secondary | ICD-10-CM | POA: Diagnosis not present

## 2012-06-10 ENCOUNTER — Ambulatory Visit: Payer: Medicare Other

## 2012-06-10 DIAGNOSIS — I69920 Aphasia following unspecified cerebrovascular disease: Secondary | ICD-10-CM | POA: Diagnosis not present

## 2012-06-10 DIAGNOSIS — I69919 Unspecified symptoms and signs involving cognitive functions following unspecified cerebrovascular disease: Secondary | ICD-10-CM | POA: Diagnosis not present

## 2012-06-10 DIAGNOSIS — Z5189 Encounter for other specified aftercare: Secondary | ICD-10-CM | POA: Diagnosis not present

## 2012-06-14 ENCOUNTER — Ambulatory Visit: Payer: Medicare Other

## 2012-06-14 DIAGNOSIS — Z5189 Encounter for other specified aftercare: Secondary | ICD-10-CM | POA: Diagnosis not present

## 2012-06-14 DIAGNOSIS — I69920 Aphasia following unspecified cerebrovascular disease: Secondary | ICD-10-CM | POA: Diagnosis not present

## 2012-06-14 DIAGNOSIS — I69919 Unspecified symptoms and signs involving cognitive functions following unspecified cerebrovascular disease: Secondary | ICD-10-CM | POA: Diagnosis not present

## 2012-06-15 ENCOUNTER — Ambulatory Visit: Payer: Medicare Other

## 2012-06-17 ENCOUNTER — Ambulatory Visit: Payer: Medicare Other

## 2012-06-22 ENCOUNTER — Ambulatory Visit: Payer: Medicare Other

## 2012-06-22 DIAGNOSIS — Z5189 Encounter for other specified aftercare: Secondary | ICD-10-CM | POA: Diagnosis not present

## 2012-06-22 DIAGNOSIS — I69920 Aphasia following unspecified cerebrovascular disease: Secondary | ICD-10-CM | POA: Diagnosis not present

## 2012-06-22 DIAGNOSIS — I69919 Unspecified symptoms and signs involving cognitive functions following unspecified cerebrovascular disease: Secondary | ICD-10-CM | POA: Diagnosis not present

## 2012-06-24 ENCOUNTER — Ambulatory Visit: Payer: Medicare Other

## 2012-06-24 DIAGNOSIS — I69919 Unspecified symptoms and signs involving cognitive functions following unspecified cerebrovascular disease: Secondary | ICD-10-CM | POA: Diagnosis not present

## 2012-06-24 DIAGNOSIS — I69920 Aphasia following unspecified cerebrovascular disease: Secondary | ICD-10-CM | POA: Diagnosis not present

## 2012-06-24 DIAGNOSIS — Z5189 Encounter for other specified aftercare: Secondary | ICD-10-CM | POA: Diagnosis not present

## 2012-06-29 ENCOUNTER — Ambulatory Visit: Payer: Medicare Other

## 2012-06-29 DIAGNOSIS — I69920 Aphasia following unspecified cerebrovascular disease: Secondary | ICD-10-CM | POA: Diagnosis not present

## 2012-06-29 DIAGNOSIS — Z5189 Encounter for other specified aftercare: Secondary | ICD-10-CM | POA: Diagnosis not present

## 2012-06-29 DIAGNOSIS — I69919 Unspecified symptoms and signs involving cognitive functions following unspecified cerebrovascular disease: Secondary | ICD-10-CM | POA: Diagnosis not present

## 2012-07-01 ENCOUNTER — Ambulatory Visit: Payer: Medicare Other

## 2012-07-01 DIAGNOSIS — I69920 Aphasia following unspecified cerebrovascular disease: Secondary | ICD-10-CM | POA: Diagnosis not present

## 2012-07-01 DIAGNOSIS — Z5189 Encounter for other specified aftercare: Secondary | ICD-10-CM | POA: Diagnosis not present

## 2012-07-01 DIAGNOSIS — I69919 Unspecified symptoms and signs involving cognitive functions following unspecified cerebrovascular disease: Secondary | ICD-10-CM | POA: Diagnosis not present

## 2012-07-06 ENCOUNTER — Ambulatory Visit: Payer: Medicare Other

## 2012-07-14 ENCOUNTER — Ambulatory Visit: Payer: Medicare Other | Attending: Family Medicine

## 2012-07-14 DIAGNOSIS — Z5189 Encounter for other specified aftercare: Secondary | ICD-10-CM | POA: Diagnosis not present

## 2012-07-14 DIAGNOSIS — I69919 Unspecified symptoms and signs involving cognitive functions following unspecified cerebrovascular disease: Secondary | ICD-10-CM | POA: Insufficient documentation

## 2012-07-14 DIAGNOSIS — I69920 Aphasia following unspecified cerebrovascular disease: Secondary | ICD-10-CM | POA: Diagnosis not present

## 2012-07-15 ENCOUNTER — Ambulatory Visit: Payer: Medicare Other

## 2012-07-15 DIAGNOSIS — I69920 Aphasia following unspecified cerebrovascular disease: Secondary | ICD-10-CM | POA: Diagnosis not present

## 2012-07-15 DIAGNOSIS — Z5189 Encounter for other specified aftercare: Secondary | ICD-10-CM | POA: Diagnosis not present

## 2012-07-15 DIAGNOSIS — I69919 Unspecified symptoms and signs involving cognitive functions following unspecified cerebrovascular disease: Secondary | ICD-10-CM | POA: Diagnosis not present

## 2012-08-09 DIAGNOSIS — Z86010 Personal history of colonic polyps: Secondary | ICD-10-CM | POA: Diagnosis not present

## 2012-08-09 DIAGNOSIS — Z1211 Encounter for screening for malignant neoplasm of colon: Secondary | ICD-10-CM | POA: Diagnosis not present

## 2012-08-09 DIAGNOSIS — K5730 Diverticulosis of large intestine without perforation or abscess without bleeding: Secondary | ICD-10-CM | POA: Diagnosis not present

## 2012-08-09 DIAGNOSIS — K219 Gastro-esophageal reflux disease without esophagitis: Secondary | ICD-10-CM | POA: Diagnosis not present

## 2012-08-25 ENCOUNTER — Encounter: Payer: Self-pay | Admitting: Nurse Practitioner

## 2012-08-25 ENCOUNTER — Ambulatory Visit (INDEPENDENT_AMBULATORY_CARE_PROVIDER_SITE_OTHER): Payer: Medicare Other | Admitting: Nurse Practitioner

## 2012-08-25 VITALS — BP 128/77 | HR 74 | Ht 74.0 in | Wt 191.0 lb

## 2012-08-25 DIAGNOSIS — I6350 Cerebral infarction due to unspecified occlusion or stenosis of unspecified cerebral artery: Secondary | ICD-10-CM | POA: Diagnosis not present

## 2012-08-25 DIAGNOSIS — R4701 Aphasia: Secondary | ICD-10-CM | POA: Diagnosis not present

## 2012-08-25 NOTE — Progress Notes (Signed)
HPI: Returns for followup after last visit with Dr. Pearlean Brownie 05/26/2012. He presented to the hospital 03/20/2012 the sudden onset of speech difficulties noted by his wife. He was not responding appropriately to questions and had slurred speech without obvious facial weakness or focal abnormalities. CT of the head showed nothing acute but MRI scan of the brain showed an acute infarct in the left posterior temporal and parietal region he was outside the window for TPA. 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 He was started on aspirin and has done well since that time. He has not had recurrent stroke or TIA symptoms. He states he speech is completely recovered to normal but his wife says that intermittently during the day he may struggle with a few words. His most recent lipid profile showed LDL of 90 and hemoglobin A1c of 5.9 He has no new neurologic complaints.  ROS: - easy bruising, memory loss, confusion  Physical Exam General: well developed, well nourished, seated, in no evident distress Head: head normocephalic and atraumatic. Oropharynx benign Neck: supple with no carotid or supraclavicular bruits Cardiovascular: regular rate and rhythm, no murmurs  Neurologic Exam Mental Status: Awake and fully alert. Oriented to place and time. Speech and language appear normal except occasional paraphrasic difficulty. Mood and affect appropriate.  Cranial Nerves: Fundoscopic exam reveals sharp disc margins. Pupils equal, briskly reactive to light. Extraocular movements full without nystagmus. Visual fields full to confrontation. Hearing intact and symmetric to finger snap. Facial sensation intact. Face, tongue, palate move normally and symmetrically. Neck flexion and extension normal.  Motor: Normal bulk and tone. Normal strength in all tested extremity muscles. No focal weakness Sensory.: intact to touch and pinprick  and vibratory.  Coordination: Rapid alternating movements normal in all extremities. Finger-to-nose and heel-to-shin performed accurately bilaterally. Gait and Station: Arises from chair without difficulty. Stance is normal. Gait demonstrates normal stride length and balance . Able to heel, toe and tandem walk without difficulty.  Reflexes: 2+ and symmetric. Toes downgoing.     ASSESSMENT: History of left posterior temporal and parietal infarct in November 2013 likely of embolic etiology but without definite identified source of embolism. Vascular risk factors of hypertension and hyperlipidemia.     PLAN: Continue aspirin for secondary stroke prevention Continue walking daily for exercise and overall general health strict control of blood pressure with the goal being 130/90 or below lipids with LDL cholesterol below 100 Followup in 6 months  Nilda Riggs, GNP-BC APRN

## 2012-08-25 NOTE — Patient Instructions (Addendum)
Continue aspirin for secondary stroke prevention Continue walking daily for exercise and overall general health strict control of blood pressure with the goal being 130/90 or below lipids with LDL cholesterol below 100 Followup in 6 months

## 2012-10-12 ENCOUNTER — Encounter: Payer: Self-pay | Admitting: Family Medicine

## 2012-10-12 ENCOUNTER — Ambulatory Visit (INDEPENDENT_AMBULATORY_CARE_PROVIDER_SITE_OTHER): Payer: Medicare Other | Admitting: Family Medicine

## 2012-10-12 VITALS — BP 130/82 | Temp 97.5°F | Wt 191.0 lb

## 2012-10-12 DIAGNOSIS — M25561 Pain in right knee: Secondary | ICD-10-CM

## 2012-10-12 DIAGNOSIS — F329 Major depressive disorder, single episode, unspecified: Secondary | ICD-10-CM | POA: Diagnosis not present

## 2012-10-12 DIAGNOSIS — I10 Essential (primary) hypertension: Secondary | ICD-10-CM | POA: Diagnosis not present

## 2012-10-12 DIAGNOSIS — L723 Sebaceous cyst: Secondary | ICD-10-CM

## 2012-10-12 DIAGNOSIS — E785 Hyperlipidemia, unspecified: Secondary | ICD-10-CM | POA: Diagnosis not present

## 2012-10-12 DIAGNOSIS — L720 Epidermal cyst: Secondary | ICD-10-CM

## 2012-10-12 DIAGNOSIS — M25569 Pain in unspecified knee: Secondary | ICD-10-CM

## 2012-10-12 NOTE — Progress Notes (Signed)
Subjective:    Patient ID: Eric George, male    DOB: [birth year], 79 y.o.   MRN: [id number]  HPI Patient seen for medical followup Prior history of CVA, recurrent small bowel obstruction, remote history prostate cancer, macular degeneration, hypertension, hyperlipidemia, GERD, depression, and past history of benign colon polyps. Recently saw a neurologist. No change in medications. Patient had left posterior temporal and left parietal CVA last fall. Had some speech changes afterwards and these have slowly been improving. He had speech therapy. Macular degeneration followed by ophthalmology.  Depression stable on sertraline. Blood pressure stable on Avapro 75 mg daily. Last lipids LDL less than 100. Patient remains on aspirin daily.  Nonsmoker. No history of diabetes  Patient some right lateral knee pains intermittently He missed a step coming off ladder about 6 weeks ago. Does not recall landing on knee. No instability.  No effusion. Pain is mild. Never had any ecchymosis. No locking or giving way  Wife request recheck darkened area on his back which he noted recently. Asymptomatic. No history of melanoma. Has had prior history of squamous cell skin cancer  Past Medical History  Diagnosis Date  . Bowel obstruction   . Hydrocephalus   . TBI (traumatic brain injury)   . Colloid cyst of brain   . GERD (gastroesophageal reflux disease)   . Hypertension   . 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 Hyperlipidemia   . Stroke    Past Surgical History  Procedure Laterality Date  . Hernia repair    . Prostatectomy    . Brain surgery    . Tee without cardioversion  03/22/2012    Procedure: TRANSESOPHAGEAL ECHOCARDIOGRAM (TEE);  Surgeon: Dolores Patty, MD;  Location: [facility] ENDOSCOPY;  Service: Cardiovascular;  Laterality: N/A;    reports that he quit smoking about 46 years ago. His smoking use included Cigarettes. He has a 7.5 pack-year smoking history. He does not have any  smokeless tobacco history on file. He reports that he does not drink alcohol or use illicit drugs. family history includes Heart disease in his sister. Allergies  Allergen Reactions  . Penicillins Rash  '    Review of Systems  Constitutional: Negative for fatigue.  Eyes: Negative for visual disturbance.  Respiratory: Negative for cough, chest tightness and shortness of breath.   Cardiovascular: Negative for chest pain, palpitations and leg swelling.  Neurological: Negative for dizziness, syncope, weakness, light-headedness and headaches.       Objective:   Physical Exam  Constitutional: He is oriented to person, place, and time. He appears well-developed and well-nourished.  Neck: Neck supple. No thyromegaly present.  Cardiovascular: Normal rate and regular rhythm.   Pulmonary/Chest: Effort normal and breath sounds normal. No respiratory distress. He has no wheezes. He has no rales.  Musculoskeletal: He exhibits no edema.  Right knee reveals full range of motion. No effusion. No warmth. No ecchymosis. No localized tenderness.  Neurological: He is alert and oriented to person, place, and time. No cranial nerve deficit.  Skin:  Patient is multiple seborrheic keratoses On his lower back region he has darkened well-demarcated area which is slightly scaly consistent with inclusion cyst/epidermal cyst          Assessment & Plan:  #1 history of CVA. We discussed secondary prevention issues. Blood pressure well controlled. Lipids been well controlled. Continue aspirin. Plan repeat lipids in 6 months #2 hypertension. Well controlled. Continue home monitoring #3 history of depression which is stable on sertraline #4 right knee  pain. Question lateral collateral ligament strain. Nonfocal exam. Reassurance #5 benign inclusion cyst back. No further intervention required

## 2012-10-12 NOTE — Patient Instructions (Addendum)
Continue with daily aspirin and all your usual medications Continue to monitor blood pressure with goal < 130/90.

## 2012-10-13 DIAGNOSIS — H40019 Open angle with borderline findings, low risk, unspecified eye: Secondary | ICD-10-CM | POA: Diagnosis not present

## 2012-10-25 ENCOUNTER — Ambulatory Visit: Payer: Medicare Other | Admitting: Family Medicine

## 2012-12-28 DIAGNOSIS — H6120 Impacted cerumen, unspecified ear: Secondary | ICD-10-CM | POA: Diagnosis not present

## 2013-01-06 DIAGNOSIS — H903 Sensorineural hearing loss, bilateral: Secondary | ICD-10-CM | POA: Diagnosis not present

## 2013-02-22 ENCOUNTER — Ambulatory Visit (INDEPENDENT_AMBULATORY_CARE_PROVIDER_SITE_OTHER): Payer: Medicare Other | Admitting: Family Medicine

## 2013-02-22 DIAGNOSIS — Z23 Encounter for immunization: Secondary | ICD-10-CM

## 2013-02-28 ENCOUNTER — Encounter: Payer: Self-pay | Admitting: Nurse Practitioner

## 2013-02-28 ENCOUNTER — Ambulatory Visit (INDEPENDENT_AMBULATORY_CARE_PROVIDER_SITE_OTHER): Payer: Medicare Other | Admitting: Nurse Practitioner

## 2013-02-28 VITALS — BP 152/90 | HR 76 | Ht 75.0 in | Wt 199.4 lb

## 2013-02-28 DIAGNOSIS — R4701 Aphasia: Secondary | ICD-10-CM | POA: Diagnosis not present

## 2013-02-28 DIAGNOSIS — I6350 Cerebral infarction due to unspecified occlusion or stenosis of unspecified cerebral artery: Secondary | ICD-10-CM

## 2013-02-28 NOTE — Patient Instructions (Addendum)
Continue aspirin for secondary stroke prevention Continue walking for overall general health and exercise Blood pressure control of systolic 130 or less Control of lipids cholesterol below 100 Followup yearly

## 2013-02-28 NOTE — Progress Notes (Signed)
[facility]  PATIENT: Eric George DOB: 1931/03/27   REASON FOR VISIT: Followup for history of stroke    HISTORY OF PRESENT ILLNESS: Eric George, 79 year old white male returns for followup. He was last seen in the office 08/25/2012. He has a history of sudden onset of speech difficulties 03/20/2012. No obvious facial weakness or focal abnormalities. MRI of the brain showed an acute infarct in the left posterior temporal and parietal region. Carotid Dopplers without significant stenosis. On return visit today he denies further stroke or TIA symptoms. He continues to have some paraphrasing difficulties with his speech. He sometimes have problems getting his words out. He is continuing to walk 4 miles daily. No new neurologic complaints   HISTORY: He presented to the hospital 03/20/2012 the sudden onset of speech difficulties noted by his wife. He was not responding appropriately to questions and had slurred speech without obvious facial weakness or focal abnormalities. CT of the head showed nothing acute but MRI scan of the brain showed an acute infarct in the left posterior temporal and parietal region he was outside the window for TPA. 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 He was started on aspirin and has done well since that time. He has not had recurrent stroke or TIA symptoms. He states he speech is completely recovered to normal but his wife says that intermittently during the day he may struggle with a few words. His most recent lipid profile showed LDL of 90 and hemoglobin A1c of 5.9 He has no new neurologic complaints.    REVIEW OF SYSTEMS: Full 14 system review of systems performed and notable only for:  Constitutional: N/A  Cardiovascular: N/A  Ear/Nose/Throat: Hearing loss  Skin: N/A  Eyes: N/A  Respiratory: N/A  Gastroitestinal: N/A  Hematology/Lymphatic:  Easy bruising Endocrine: N/A Musculoskeletal:N/A  Allergy/Immunology: Allergies Neurological: Speech is slurred, memory loss  Psychiatric: N/A   ALLERGIES: Allergies  Allergen Reactions  . Penicillins Rash    HOME MEDICATIONS: Outpatient Prescriptions Prior to Visit  Medication Sig Dispense Refill  . aspirin 325 MG tablet Take 1 tablet (325 mg total) by mouth daily.  31 tablet  0  . atorvastatin (LIPITOR) 40 MG tablet Take 1 tablet (40 mg total) by mouth daily at 6 PM.  90 tablet  3  . irbesartan (AVAPRO) 75 MG tablet Take 75 mg by mouth daily.      . Multiple Vitamins-Minerals (MACULAR VITAMIN BENEFIT) TABS Take 1 capsule by mouth daily.      Marland Kitchen omeprazole-sodium bicarbonate (ZEGERID) 40-1100 MG per capsule Take 1 capsule by mouth 2 (two) times daily.        . Probiotic Product (ALIGN) 4 MG CAPS Take 1 capsule by mouth daily.      . sertraline (ZOLOFT) 50 MG tablet Take 50 mg by mouth daily.       No facility-administered medications prior to visit.    PAST MEDICAL HISTORY: Past Medical History  Diagnosis Date  . Bowel obstruction   . Hydrocephalus   . TBI (traumatic brain injury)   . Colloid cyst of brain   . GERD (gastroesophageal reflux disease)   . Hypertension   . 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 Hyperlipidemia   . Stroke     PAST SURGICAL HISTORY: Past Surgical History  Procedure Laterality Date  . Hernia repair    . Prostatectomy    . Brain surgery    .  Tee without cardioversion  03/22/2012    Procedure: TRANSESOPHAGEAL ECHOCARDIOGRAM (TEE);  Surgeon: Dolores Patty, MD;  Location: [facility] ENDOSCOPY;  Service: Cardiovascular;  Laterality: N/A;    FAMILY HISTORY: Family History  Problem Relation Age of Onset  . Heart disease Sister     SOCIAL HISTORY: History   Social History  . Marital Status: Married    Spouse Name: Erskine Squibb     Number of Children: 2  . Years of Education: N/A   Occupational History  . retired    Social History Main  Topics  . Smoking status: Former Smoker -- 0.50 packs/day for 15 years    Types: Cigarettes    Quit date: 09/30/1966  . Smokeless tobacco: Never Used  . Alcohol Use: No  . Drug Use: No  . Sexual Activity: No   Other Topics Concern  . Not on file   Social History Narrative   Patient lives at home with his wife Erskine Squibb. He is retired and has a Geographical information systems officer.    Patient has 2 children.      PHYSICAL EXAM  Filed Vitals:   02/28/13 1334  BP: 152/90  Pulse: 76  Height: 6\' 3"  (1.905 m)  Weight: 199 lb 6.4 oz (90.447 kg)   Body mass index is 24.92 kg/(m^2).  Generalized: Well developed, in no acute distress  Head: normocephalic and atraumatic,. Oropharynx benign  Neck: Supple, no carotid bruits  Cardiac: Regular rate rhythm, no murmur  Musculoskeletal: No deformity   Neurological examination   Mentation: Alert oriented to time, place, history taking.MMSE 25/30 missing items in orientation and recall  Follows all commands, occasional paraphrasing difficulty, word finding difficulties.   Cranial nerve II-XII: Pupils were equal round reactive to light extraocular movements were full, visual field were full on confrontational test. Facial sensation and strength were normal. hearing was intact to finger rubbing bilaterally. Uvula tongue midline. head turning and shoulder shrug and were normal and symmetric.Tongue protrusion into cheek strength was normal. Motor: normal bulk and tone, full strength in the BUE, BLE, fine finger movements normal, no pronator drift. No focal weakness Sensory: normal and symmetric to light touch, pinprick, and  vibration  Coordination: finger-nose-finger, heel-to-shin bilaterally, no dysmetria Reflexes: Brachioradialis 2/2, biceps 2/2, triceps 2/2, patellar 2/2, Achilles 2/2, plantar responses were flexor bilaterally. Gait and Station: Rising up from seated position without assistance, normal stance, without trunk ataxia, moderate stride, good arm swing,  smooth turning, able to perform tiptoe, and heel walking without difficulty. Tandem gait steady  DIAGNOSTIC DATA (LABS, IMAGING, TESTING) -None to review    ASSESSMENT AND PLAN  79 y.o. year old male  has a past medical history of left posterior temporal and parietal infarct November 2013 likely of embolic origin but without definite identified source. Vascular risk factors of hypertension and hyperlipidemia   Continue aspirin for secondary stroke prevention Continue walking for overall general health and exercise Blood pressure control of systolic 130 or less Control of lipids cholesterol below 100 Followup yearly Nilda Riggs, [facility], [facility], APRN  [facility] [address], [address] [place], [place] [address] [phone]

## 2013-03-13 ENCOUNTER — Other Ambulatory Visit: Payer: Self-pay | Admitting: Family Medicine

## 2013-04-11 ENCOUNTER — Ambulatory Visit (INDEPENDENT_AMBULATORY_CARE_PROVIDER_SITE_OTHER): Payer: Medicare Other | Admitting: Family Medicine

## 2013-04-11 ENCOUNTER — Encounter: Payer: Self-pay | Admitting: Family Medicine

## 2013-04-11 VITALS — BP 132/80 | HR 73 | Temp 97.2°F | Wt 195.0 lb

## 2013-04-11 DIAGNOSIS — E785 Hyperlipidemia, unspecified: Secondary | ICD-10-CM | POA: Diagnosis not present

## 2013-04-11 DIAGNOSIS — I10 Essential (primary) hypertension: Secondary | ICD-10-CM | POA: Diagnosis not present

## 2013-04-11 LAB — LIPID PANEL
Cholesterol: 143 mg/dL (ref 0–200)
HDL: 35.6 mg/dL — ABNORMAL LOW (ref >39.00)
LDL Cholesterol: 73 mg/dL (ref 0–99)
Total CHOL/HDL Ratio: 4
Triglycerides: 170 mg/dL — ABNORMAL HIGH (ref 0.0–149.0)
VLDL: 34 mg/dL (ref 0.0–40.0)

## 2013-04-11 LAB — BASIC METABOLIC PANEL
BUN: 22 mg/dL (ref 6–23)
CO2: 26 mEq/L (ref 19–32)
Calcium: 9.4 mg/dL (ref 8.4–10.5)
Chloride: 102 mEq/L (ref 96–112)
Creatinine, Ser: 1 mg/dL (ref 0.4–1.5)
GFR: 72.95 mL/min (ref >60.00)
Glucose, Bld: 87 mg/dL (ref 70–99)
Potassium: 5.5 mEq/L — ABNORMAL HIGH (ref 3.5–5.1)
Sodium: 133 mEq/L — ABNORMAL LOW (ref 135–145)

## 2013-04-11 LAB — HEPATIC FUNCTION PANEL
ALT: 24 U/L (ref 0–53)
AST: 18 U/L (ref 0–37)
Albumin: 4 g/dL (ref 3.5–5.2)
Alkaline Phosphatase: 70 U/L (ref 39–117)
Bilirubin, Direct: 0 mg/dL (ref 0.0–0.3)
Total Bilirubin: 0.6 mg/dL (ref 0.3–1.2)
Total Protein: 6.8 g/dL (ref 6.0–8.3)

## 2013-04-11 NOTE — Progress Notes (Signed)
Pre visit review using our clinic review tool, if applicable. No additional management support is needed unless otherwise documented below in the visit note. 

## 2013-04-11 NOTE — Progress Notes (Signed)
   Subjective:    Patient ID: Eric George, male    DOB: 1931-01-08, 79 y.o.   MRN: [id number]  HPI Patient seen for routine medical follow up. He had prior history of CVA, hypertension, hyperlipidemia, GERD, macular degeneration, depression. Medications reviewed. Compliant with all. Mood is stable. No recent falls. He is battling some ongoing hearing issues and is seen both ENT and audiologist within the past year.  Blood pressures been well controlled on home readings. Mostly 130 systolic or lower. No recent orthostasis. No chest pains. He remains on aspirin. He and his wife have decided this point not to pursue any further colonoscopies though apparently repeat was indicated this year.  Also hx of SBO but no recent symptoms.  Past Medical History  Diagnosis Date  . Bowel obstruction   . Hydrocephalus   . TBI (traumatic brain injury)   . Colloid cyst of brain   . GERD (gastroesophageal reflux disease)   . Hypertension   . 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 Hyperlipidemia   . Stroke    Past Surgical History  Procedure Laterality Date  . Hernia repair    . Prostatectomy    . Brain surgery    . Tee without cardioversion  03/22/2012    Procedure: TRANSESOPHAGEAL ECHOCARDIOGRAM (TEE);  Surgeon: Dolores Patty, MD;  Location: [facility] ENDOSCOPY;  Service: Cardiovascular;  Laterality: N/A;    reports that he quit smoking about 46 years ago. His smoking use included Cigarettes. He has a 7.5 pack-year smoking history. He has never used smokeless tobacco. He reports that he does not drink alcohol or use illicit drugs. family history includes Heart disease in his sister. Allergies  Allergen Reactions  . Penicillins Rash      Review of Systems  Constitutional: Negative for fatigue.  Eyes: Negative for visual disturbance.  Respiratory: Negative for cough, chest tightness and shortness of breath.   Cardiovascular: Negative for chest pain, palpitations and leg swelling.    Neurological: Negative for dizziness, syncope, weakness, light-headedness and headaches.       Objective:   Physical Exam  Constitutional: He appears well-developed and well-nourished.  Neck: Neck supple. No thyromegaly present.  Cardiovascular: Normal rate.   Pulmonary/Chest: Effort normal and breath sounds normal. No respiratory distress. He has no wheezes. He has no rales.  Musculoskeletal: He exhibits no edema.  Lymphadenopathy:    He has no cervical adenopathy.          Assessment & Plan:  #1 hypertension. Adequately controlled. Continue current medication. Check basic metabolic panel #2 hyperlipidemia. Goal LDL less than 100. Repeat lipid and hepatic panel #3 history of cerebrovascular disease. Continue aspirin. Continue LDL goals above. Blood pressure goal is 130/80.

## 2013-04-13 DIAGNOSIS — H2510 Age-related nuclear cataract, unspecified eye: Secondary | ICD-10-CM | POA: Diagnosis not present

## 2013-04-13 DIAGNOSIS — H35039 Hypertensive retinopathy, unspecified eye: Secondary | ICD-10-CM | POA: Diagnosis not present

## 2013-04-13 DIAGNOSIS — H353190 Nonexudative age-related macular degeneration, unspecified eye, stage unspecified: Secondary | ICD-10-CM | POA: Diagnosis not present

## 2013-04-13 DIAGNOSIS — H40019 Open angle with borderline findings, low risk, unspecified eye: Secondary | ICD-10-CM | POA: Diagnosis not present

## 2013-04-30 ENCOUNTER — Other Ambulatory Visit: Payer: Self-pay | Admitting: Family Medicine

## 2013-05-18 ENCOUNTER — Telehealth: Payer: Self-pay | Admitting: Family Medicine

## 2013-05-18 ENCOUNTER — Encounter: Payer: Self-pay | Admitting: Family Medicine

## 2013-05-18 ENCOUNTER — Ambulatory Visit (INDEPENDENT_AMBULATORY_CARE_PROVIDER_SITE_OTHER): Payer: Medicare Other | Admitting: Family Medicine

## 2013-05-18 VITALS — BP 140/80 | HR 76 | Temp 97.3°F | Wt 189.0 lb

## 2013-05-18 DIAGNOSIS — R110 Nausea: Secondary | ICD-10-CM

## 2013-05-18 DIAGNOSIS — I10 Essential (primary) hypertension: Secondary | ICD-10-CM | POA: Diagnosis not present

## 2013-05-18 LAB — BASIC METABOLIC PANEL
BUN: 20 mg/dL (ref 6–23)
CO2: 26 mEq/L (ref 19–32)
Calcium: 9.3 mg/dL (ref 8.4–10.5)
Chloride: 105 mEq/L (ref 96–112)
Creatinine, Ser: 1.1 mg/dL (ref 0.4–1.5)
GFR: 72.14 mL/min (ref >60.00)
Glucose, Bld: 96 mg/dL (ref 70–99)
Potassium: 4.2 mEq/L (ref 3.5–5.1)
Sodium: 138 mEq/L (ref 135–145)

## 2013-05-18 NOTE — Telephone Encounter (Signed)
Patient Information:  Caller Name: [name]  Phone: [phone]  Patient: Eric, George  Gender: Male  DOB: 03/28/1931  Age: 80 Years  PCP: Carolann Littler [facility])  Office Follow Up:  Does the office need to follow up with this patient?: No  Instructions For The Office: N/A  RN Note:  History of CVA 03/20/2012.  Nauseated with hypertension.  No vomiting. While sitting in living room this morning told his wife he was "just not feeling good." Assisted back to bed without difficulty walking. No change to chronic speech impediment that has been present since CVA. Repeat BP 160/85 P 66 at 0945 left arm while supine. Denies headache, chest pain, shortness of breath, tingling or numbness, able to move both arms and legs.  Symptoms  Reason For Call & Symptoms: Nausea with hyptertension.  BP 165/135 at 0800 sitting right arm;  188/160 at 0815, and 175/94 at 0900 right arm while supine.  Reviewed Health History In EMR: Yes  Reviewed Medications In EMR: Yes  Reviewed Allergies In EMR: Yes  Reviewed Surgeries / Procedures: Yes  Date of Onset of Symptoms: 05/18/2013  Treatments Tried: Put into bed to reduce BP  Treatments Tried Worked: Yes  Guideline(s) Used:  High Blood Pressure  Disposition Per Guideline:   See Today in Office  Reason For Disposition Reached:   BP > 180/110  Advice Given:  General:  Untreated high blood pressure may cause damage to the heart, brain, kidneys, and eyes.  Treatment of high blood pressure can reduce the risk of stroke, heart attack, and heart failure.  The goal of blood pressure treatment for most patients with hypertension is to keep the blood pressure under 140/90.  Call Back If:  Headache, blurred vision, difficulty talking, or difficulty walking occurs  Chest pain or difficulty breathing occurs  You become worse.  Patient Will Follow Care Advice:  YES  Appointment Scheduled:  05/18/2013 13:45:00 Appointment Scheduled Provider:  Carolann Littler [facility])

## 2013-05-18 NOTE — Progress Notes (Signed)
Pre visit review using our clinic review tool, if applicable. No additional management support is needed unless otherwise documented below in the visit note. 

## 2013-05-18 NOTE — Patient Instructions (Signed)
Monitor blood pressure closely and if BP consistently > 150/90 we may need to increase Avapro to 150 mg.

## 2013-05-18 NOTE — Progress Notes (Signed)
   Subjective:    Patient ID: Eric George, male    DOB: 1931-01-17, 80 y.o.   MRN: [id number]  HPI Acute visit for nausea. Onset this morning. 2 separate episodes that lasted about 5 minutes each. No vomiting. No abdominal pain. He does have history of small bowel obstruction in the past but these symptoms were different. He had vomiting then. He has not had any abdominal distention. He had normal bowel movement this morning and no episodes of nausea since then. Denies any vertigo or dizziness. No headaches. No focal weakness. No speech changes. He had remote history of stroke. Denies any dysuria.  Wife checked blood pressure and he had one reading of 160/85 and subsequent reading as high as 188/160- which was obviously in error.  He feels well at this time. No specific complaints. No recent postnasal drip symptoms. Takes very low dose Avapro 75 mg for hypertension. Previously on 150 mg and had orthostasis.  Past Medical History  Diagnosis Date  . Bowel obstruction   . Hydrocephalus   . TBI (traumatic brain injury)   . Colloid cyst of brain   . GERD (gastroesophageal reflux disease)   . Hypertension   . 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 Hyperlipidemia   . Stroke    Past Surgical History  Procedure Laterality Date  . Hernia repair    . Prostatectomy    . Brain surgery    . Tee without cardioversion  03/22/2012    Procedure: TRANSESOPHAGEAL ECHOCARDIOGRAM (TEE);  Surgeon: [name], MD;  Location: [facility] ENDOSCOPY;  Service: Cardiovascular;  Laterality: N/A;    reports that he quit smoking about 46 years ago. His smoking use included Cigarettes. He has a 7.5 pack-year smoking history. He has never used smokeless tobacco. He reports that he does not drink alcohol or use illicit drugs. family history includes Heart disease in his sister. Allergies  Allergen Reactions  . Penicillins Rash      Review of Systems  Constitutional: Negative for fatigue.  Eyes:  Negative for visual disturbance.  Respiratory: Negative for cough, chest tightness and shortness of breath.   Cardiovascular: Negative for chest pain, palpitations and leg swelling.  Gastrointestinal: Positive for nausea. Negative for vomiting, abdominal pain, diarrhea, constipation, blood in stool, abdominal distention and anal bleeding.  Endocrine: Negative for polydipsia and polyuria.  Genitourinary: Negative for dysuria.  Neurological: Negative for dizziness, syncope, weakness, light-headedness and headaches.       Objective:   Physical Exam  Constitutional: He appears well-developed and well-nourished.  HENT:  Mouth/Throat: Oropharynx is clear and moist.  Neck: Neck supple. No thyromegaly present.  Cardiovascular: Normal rate.   Pulmonary/Chest: Effort normal and breath sounds normal. No respiratory distress. He has no wheezes. He has no rales.  Abdominal: Soft. Bowel sounds are normal. He exhibits no distension and no mass. There is no tenderness. There is no rebound and no guarding.  Musculoskeletal: He exhibits no edema.  Neurological: He is alert.          Assessment & Plan:  Nausea. This was very transient: Lasting about 5 minutes. Nonfocal exam and no specific elements from history/or exam to suggest abdominal pathology. He's had previous recurrent small bowel structure but normal exam and had normal bowel movement. Check basic metabolic panel. Had previous mild hyponatremia but does not take any diuretics.  Hypertension. Reading today in office 140/80. Continue close observation. Be in touch if consistently readings over 140/90. May need to titrate Avapro.

## 2013-05-18 NOTE — Telephone Encounter (Signed)
Noted. Pt coming in to be seen today in office

## 2013-05-19 ENCOUNTER — Telehealth: Payer: Self-pay | Admitting: Family Medicine

## 2013-05-19 NOTE — Telephone Encounter (Signed)
I would go ahead now and increase Avapro to 150 mg daily.

## 2013-05-19 NOTE — Telephone Encounter (Signed)
Wife is aware.  Advised her to monitor patients blood pressure and call back if needed.

## 2013-05-19 NOTE — Telephone Encounter (Signed)
Patient Information:  Caller Name: [name]  Phone: [phone]  Patient: Eric George, Eric George  Gender: Male  DOB: 05/01/1931  Age: 80 Years  PCP: Carolann Littler [facility])  Office Follow Up:  Does the office need to follow up with this patient?: Yes  Instructions For The Office: Wife would like instruction regarding how many days to wait to call with BP readings 140/90.  Please follow up with wife.   Symptoms  Reason For Call & Symptoms: 05/18/13 pt was seen for office visit, advised by Dr Elease Hashimoto to call if BP's were consistently over 140/90.    After leaving office, BP was 172/86 and remained in the 160s/80s until bedtime.   05/19/13  first BP this am was 149/83, one hour later BP 139/69, no nausea today.   Wife wants to know at what point should she be concerned about a high BP reading, care advice discussed with wife with callback perimeters.   Also, wife would like to know how many days of readings with BP 140/90 should she wait until letting Dr Elease Hashimoto know for a possible med increase as advised.  Please follow up with wife. [name], wife, Cell - [phone]  Reviewed Health History In EMR: Yes  Reviewed Medications In EMR: Yes  Reviewed Allergies In EMR: Yes  Reviewed Surgeries / Procedures: Yes  Date of Onset of Symptoms: 05/18/2013  Guideline(s) Used:  High Blood Pressure  Disposition Per Guideline:   See Within 2 Weeks in Office  Reason For Disposition Reached:   BP > 130/80 and history of heart problems, kidney disease, or diabetes  Advice Given:  Call Back If:  Headache, blurred vision, difficulty talking, or difficulty walking occurs  Chest pain or difficulty breathing occurs  You become worse.  Patient Will Follow Care Advice:  YES

## 2013-05-23 ENCOUNTER — Telehealth: Payer: Self-pay | Admitting: Family Medicine

## 2013-05-23 MED ORDER — IRBESARTAN 150 MG PO TABS: 150.0000 mg | ORAL_TABLET | Freq: Every day | ORAL | Status: DC

## 2013-05-23 NOTE — Telephone Encounter (Signed)
Spoke with patient and Rx sent °

## 2013-05-23 NOTE — Telephone Encounter (Signed)
Continue Avapro 150 mg once daily and [facility].

## 2013-05-23 NOTE — Telephone Encounter (Signed)
Patient Information:  Caller Name: [name]  Phone: [phone]  Patient: Eric George, Eric George  Gender: Male  DOB: [birth year]  Age: 80 Years  PCP: Carolann Littler (Family Practice)  Office Follow Up:  Does the office need to follow up with this patient?: Yes  Instructions For The Office: PLEASE CONTACT REGARDING BLOOD PRESSURE READINGS AND NEW MEDICATION RX  RN Note:  Please contact wife regarding Blood pressure readings with new medication dosage adjustment.  (2) Needs new script for medication.  PLEASE CONTACT  Symptoms  Reason For Call & Symptoms: Wife states patient was in the office on 05/18/13 for elevated blood pressure .  She was advised to mointor blood pressure and increase Avapro 75mg  to 150mg  daily on 05/19/13.  She was to call back and report reading.   Saturday 05/20/13  his pressure was  132/63- 160/68, 152/80.   Sunday 05/21/13 the blood pressure range was 133/80,  152/ 85 at 5:00 pm , 164/91 at bedtime.  Monday 05/22/13  pressure 139/74 , 154/82 at 5:00 pm  and 169/88 at bedtime.  He takes his blood pressure medication every day 5pm.  This morning 05/23/13 was 132/69.   What is Dr. Elease Hashimoto recommendations.   (2) She needs a Rx for Avapro 150mg  sent to [facility] History In EMR: Yes  Reviewed Medications In EMR: Yes  Reviewed Allergies In EMR: Yes  Reviewed Surgeries / Procedures: Yes  Date of Onset of Symptoms: 05/19/2013  Treatments Tried: Increase of blood pressure medication  Treatments Tried Worked: Yes  Guideline(s) Used:  High Blood Pressure  Disposition Per Guideline:   Home Care  Reason For Disposition Reached:   BP < 140 /90 and taking BP medications  Advice Given:  Call Back If:  Headache, blurred vision, difficulty talking, or difficulty walking occurs  Chest pain or difficulty breathing occurs  You become worse.  RN Overrode Recommendation:  Patient Requests Prescription  PLEASE CONTACT REGARDING PHYSICIANS RECOMMENDATIONS AND NEW  RX

## 2013-06-07 ENCOUNTER — Telehealth: Payer: Self-pay | Admitting: Family Medicine

## 2013-06-07 NOTE — Telephone Encounter (Signed)
Relevant patient education mailed to patient.  

## 2013-07-30 ENCOUNTER — Other Ambulatory Visit: Payer: Self-pay | Admitting: Family Medicine

## 2013-08-20 ENCOUNTER — Other Ambulatory Visit: Payer: Self-pay | Admitting: Family Medicine

## 2013-08-29 DIAGNOSIS — K219 Gastro-esophageal reflux disease without esophagitis: Secondary | ICD-10-CM | POA: Diagnosis not present

## 2013-08-29 DIAGNOSIS — R143 Flatulence: Secondary | ICD-10-CM | POA: Diagnosis not present

## 2013-08-29 DIAGNOSIS — K5730 Diverticulosis of large intestine without perforation or abscess without bleeding: Secondary | ICD-10-CM | POA: Diagnosis not present

## 2013-08-29 DIAGNOSIS — R141 Gas pain: Secondary | ICD-10-CM | POA: Diagnosis not present

## 2013-08-29 DIAGNOSIS — R142 Eructation: Secondary | ICD-10-CM | POA: Diagnosis not present

## 2013-09-06 ENCOUNTER — Ambulatory Visit (INDEPENDENT_AMBULATORY_CARE_PROVIDER_SITE_OTHER): Payer: Medicare Other | Admitting: Family Medicine

## 2013-09-06 ENCOUNTER — Telehealth: Payer: Self-pay | Admitting: Family Medicine

## 2013-09-06 ENCOUNTER — Encounter: Payer: Self-pay | Admitting: Family Medicine

## 2013-09-06 VITALS — BP 136/74 | HR 81 | Temp 97.6°F | Wt 183.0 lb

## 2013-09-06 DIAGNOSIS — I10 Essential (primary) hypertension: Secondary | ICD-10-CM

## 2013-09-06 DIAGNOSIS — S0093XA Contusion of unspecified part of head, initial encounter: Secondary | ICD-10-CM

## 2013-09-06 DIAGNOSIS — S1093XA Contusion of unspecified part of neck, initial encounter: Secondary | ICD-10-CM

## 2013-09-06 DIAGNOSIS — S7000XA Contusion of unspecified hip, initial encounter: Secondary | ICD-10-CM | POA: Diagnosis not present

## 2013-09-06 DIAGNOSIS — S0003XA Contusion of scalp, initial encounter: Secondary | ICD-10-CM

## 2013-09-06 DIAGNOSIS — S7001XA Contusion of right hip, initial encounter: Secondary | ICD-10-CM

## 2013-09-06 DIAGNOSIS — S0083XA Contusion of other part of head, initial encounter: Secondary | ICD-10-CM

## 2013-09-06 NOTE — Telephone Encounter (Signed)
Noted  

## 2013-09-06 NOTE — Progress Notes (Signed)
Subjective:    Patient ID: Eric George, male    DOB: [birth year], 80 y.o.   MRN: [id number]  Hypertension Pertinent negatives include no chest pain or headaches.   Patient is seen after fall last  night going to the bathroom. There was no reported syncope. He apparently ran into a doorway and fell down onto his right side. He struck the right side of his head and right hip. There was no loss of consciousness. No definite confusion. He had some very transient nausea but no vomiting and that has resolved. No recent vertigo. He denies any dizziness at this time. No recent orthostatic symptoms. Takes Avapro 150 mg once daily for hypertension. He denies any headache.  Patient had some swelling right side of temporal region this morning but again no headache. He complains of some mild right lateral hip tenderness but no difficulties with ambulation. No groin pain.  Wife was concerned because blood pressure was 170/105 right after his fall. Compliant with medication and generally blood pressures been well controlled including this morning they're back to normal. Denies recent chest pains. No dyspnea.  Past Medical History  Diagnosis Date  . Bowel obstruction   . Hydrocephalus   . TBI (traumatic brain injury)   . Colloid cyst of brain   . GERD (gastroesophageal reflux disease)   . Hypertension   . 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 Hyperlipidemia   . Stroke    Past Surgical History  Procedure Laterality Date  . Hernia repair    . Prostatectomy    . Brain surgery    . Tee without cardioversion  03/22/2012    Procedure: TRANSESOPHAGEAL ECHOCARDIOGRAM (TEE);  Surgeon: [name], MD;  Location: [facility] ENDOSCOPY;  Service: Cardiovascular;  Laterality: N/A;    reports that he quit smoking about 46 years ago. His smoking use included Cigarettes. He has a 7.5 pack-year smoking history. He has never used smokeless tobacco. He reports that he does not drink alcohol or use illicit  drugs. family history includes Heart disease in his sister. Allergies  Allergen Reactions  . Penicillins Rash      Review of Systems  Constitutional: Negative for fever and chills.  Respiratory: Negative for cough.   Cardiovascular: Negative for chest pain.  Gastrointestinal: Negative for abdominal pain.  Neurological: Negative for dizziness, seizures, syncope, speech difficulty, weakness and headaches.       Objective:   Physical Exam  Constitutional: He is oriented to person, place, and time. He appears well-developed and well-nourished.  HENT:  Right Ear: External ear normal.  Patient has some minimal swelling over the right temporal region and is minimally tender to palpation. Otherwise no signs of head trauma  Eyes: Pupils are equal, round, and reactive to light.  Neck: Neck supple. No thyromegaly present.  Cardiovascular: Normal rate.   Pulmonary/Chest: Effort normal and breath sounds normal. No respiratory distress. He has no wheezes. He has no rales.  Musculoskeletal: He exhibits no edema.  Patient has full range of motion right hip. He has minimal ecchymosis visible over the greater trochanteric bursa region. Minimal tenderness laterally.  Neurological: He is alert and oriented to person, place, and time. He has normal reflexes. No cranial nerve deficit. Coordination normal.  Gait is normal unassisted  Psychiatric: He has a normal mood and affect. His behavior is normal. Judgment and thought content normal.          Assessment & Plan:  Status post fall with contusion right lateral  hip but no suspicion for fracture. He has contusion of right side of head but no worrisome symptoms at this time for closed head injury. Head injury sheet given. Followup promptly for any confusion, focal weakness, seizure, recurrent nausea or vomiting, or any other focal or new concerns.  Hypertension is stable. He had elevation following his fall but none since then. Continue to observe  for now. No recent orthostasis.

## 2013-09-06 NOTE — Telephone Encounter (Signed)
Patient Information:  Caller Name: [name]  Phone: [phone]  Patient: Eric George, Eric George  Gender: Male  DOB: 1930-06-15  Age: 80 Years  PCP: Carolann Littler [facility]  Office Follow Up:  Does the office need to follow up with this patient?: No  Instructions For The Office: N/A   Symptoms  Reason For Call & Symptoms: Patient says he felt unsteady on way to bathroom.  Fell about 1:45 am today in bathroom, hit head above right temple and cheek - noticing some bruising.  Appeared cool and sweaty at first.  Complained of headache and nausea but has not vomited.  No LOC noted.  BP was 176/105 when she got him back to bed and gave him the blood pressure pill he had missed taking in the evening.  BP at 4 am was 168/76, later readings 148/71, 151/71.  Would like to have him checked  Reviewed Health History In EMR: Yes  Reviewed Medications In EMR: Yes  Reviewed Allergies In EMR: Yes  Reviewed Surgeries / Procedures: Yes  Date of Onset of Symptoms: 09/06/2013  Treatments Tried: blood pressure pill  Treatments Tried Worked: Yes  Guideline(s) Used:  Head Injury  Disposition Per Guideline:   See Today in Office  Reason For Disposition Reached:   Patient wants to be seen  Advice Given:  N/A  Patient Will Follow Care Advice:  YES  Appointment Scheduled:  09/06/2013 09:45:00 Appointment Scheduled Provider:  Carolann Littler (Family Practice)

## 2013-09-06 NOTE — Progress Notes (Signed)
Pre visit review using our clinic review tool, if applicable. No additional management support is needed unless otherwise documented below in the visit note. 

## 2013-09-06 NOTE — Patient Instructions (Signed)
Head Injury, Adult  You have received a head injury. It does not appear serious at this time. Headaches and vomiting are common following head injury. It should be easy to awaken from sleeping. Sometimes it is necessary for you to stay in the emergency department for a while for observation. Sometimes admission to the hospital may be needed. After injuries such as yours, most problems occur within the first 24 hours, but side effects may occur up to 7 10 days after the injury. It is important for you to carefully monitor your condition and contact your health care provider or seek immediate medical care if there is a change in your condition.  WHAT ARE THE TYPES OF HEAD INJURIES?  Head injuries can be as minor as a bump. Some head injuries can be more severe. More severe head injuries include:  · A jarring injury to the brain (concussion).  · A bruise of the brain (contusion). This mean there is bleeding in the brain that can cause swelling.  · A cracked skull (skull fracture).  · Bleeding in the brain that collects, clots, and forms a bump (hematoma).  WHAT CAUSES A HEAD INJURY?  A serious head injury is most likely to happen to someone who is in a car wreck and is not wearing a seat belt. Other causes of major head injuries include bicycle or motorcycle accidents, sports injuries, and falls.  HOW ARE HEAD INJURIES DIAGNOSED?  A complete history of the event leading to the injury and your current symptoms will be helpful in diagnosing head injuries. Many times, pictures of the brain, such as CT or MRI are needed to see the extent of the injury. Often, an overnight hospital stay is necessary for observation.   WHEN SHOULD I SEEK IMMEDIATE MEDICAL CARE?   You should get help right away if:  · You have confusion or drowsiness.  · You feel sick to your stomach (nauseous) or have continued, forceful vomiting.  · You have dizziness or unsteadiness that is getting worse.  · You have severe, continued headaches not  relieved by medicine. Only take over-the-counter or prescription medicines for pain, fever, or discomfort as directed by your health care provider.  · You do not have normal function of the arms or legs or are unable to walk.  · You notice changes in the black spots in the center of the colored part of your eye (pupil).  · You have a clear or bloody fluid coming from your nose or ears.  · You have a loss of vision.  During the next 24 hours after the injury, you must stay with someone who can watch you for the warning signs. This person should contact local emergency services (911 in the [place] if you have seizures, you become unconscious, or you are unable to wake up.  HOW CAN I PREVENT A HEAD INJURY IN THE FUTURE?  The most important factor for preventing major head injuries is avoiding motor vehicle accidents.  To minimize the potential for damage to your head, it is crucial to wear seat belts while riding in motor vehicles. Wearing helmets while bike riding and playing collision sports (like football) is also helpful. Also, avoiding dangerous activities around the house will further help reduce your risk of head injury.   WHEN CAN I RETURN TO NORMAL ACTIVITIES AND ATHLETICS?  You should be reevaluated by your health care provider before returning to these activities. If you have any of the following symptoms, you should not return   to activities or contact sports until 1 week after the symptoms have stopped:  · Persistent headache.  · Dizziness or vertigo.  · Poor attention and concentration.  · Confusion.  · Memory problems.  · Nausea or vomiting.  · Fatigue or tire easily.  · Irritability.  · Intolerant of bright lights or loud noises.  · Anxiety or depression.  · Disturbed sleep.  MAKE SURE YOU:   · Understand these instructions.  · Will watch your condition.  · Will get help right away if you are not doing well or get worse.  Document Released: 04/20/2005 Document Revised: 02/08/2013 Document Reviewed:  12/26/2012  ExitCare® Patient Information ©2014 [employer].

## 2013-09-29 ENCOUNTER — Encounter: Payer: Self-pay | Admitting: Family Medicine

## 2013-09-29 ENCOUNTER — Ambulatory Visit (INDEPENDENT_AMBULATORY_CARE_PROVIDER_SITE_OTHER)
Admission: RE | Admit: 2013-09-29 | Discharge: 2013-09-29 | Disposition: A | Discharge status: Home / Self Care | Payer: Medicare Other | Source: Ambulatory Visit | Attending: Family Medicine | Admitting: Family Medicine

## 2013-09-29 ENCOUNTER — Ambulatory Visit (INDEPENDENT_AMBULATORY_CARE_PROVIDER_SITE_OTHER): Payer: Medicare Other | Admitting: Family Medicine

## 2013-09-29 VITALS — BP 132/80 | HR 70 | Temp 97.4°F | Wt 185.0 lb

## 2013-09-29 DIAGNOSIS — M25559 Pain in unspecified hip: Secondary | ICD-10-CM

## 2013-09-29 DIAGNOSIS — S79929A Unspecified injury of unspecified thigh, initial encounter: Secondary | ICD-10-CM | POA: Diagnosis not present

## 2013-09-29 DIAGNOSIS — M25551 Pain in right hip: Secondary | ICD-10-CM

## 2013-09-29 DIAGNOSIS — S79919A Unspecified injury of unspecified hip, initial encounter: Secondary | ICD-10-CM | POA: Diagnosis not present

## 2013-09-29 NOTE — Progress Notes (Signed)
   Subjective:    Patient ID: Eric George, male    DOB: 05/25/1930, 80 y.o.   MRN: [id number]  Hip Pain  Pertinent negatives include no numbness.  Knee Pain  Pertinent negatives include no numbness.   Patient had a fall on May 6. Refer to prior note. He presents with some persistent right lateral hip pain. We had a low suspicion of fracture did not obtain x-ray. He has right lateral hip pain which is worse with first walking after prolonged periods of sitting. Had some complaints of some nonspecific knee pain as well but mostly right lateral hip pain. No back pain. Denies lower extremity weakness. He has not had any headache. No alleviating factors. Has not tried any icing or heat. No nonsteroidals.  Past Medical History  Diagnosis Date  . Bowel obstruction   . Hydrocephalus   . TBI (traumatic brain injury)   . Colloid cyst of brain   . GERD (gastroesophageal reflux disease)   . Hypertension   . 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 Hyperlipidemia   . Stroke    Past Surgical History  Procedure Laterality Date  . Hernia repair    . Prostatectomy    . Brain surgery    . Tee without cardioversion  03/22/2012    Procedure: TRANSESOPHAGEAL ECHOCARDIOGRAM (TEE);  Surgeon: [name], MD;  Location: [facility] ENDOSCOPY;  Service: Cardiovascular;  Laterality: N/A;    reports that he quit smoking about 47 years ago. His smoking use included Cigarettes. He has a 7.5 pack-year smoking history. He has never used smokeless tobacco. He reports that he does not drink alcohol or use illicit drugs. family history includes Heart disease in his sister. Allergies  Allergen Reactions  . Penicillins Rash      Review of Systems  Constitutional: Negative for appetite change and unexpected weight change.  Neurological: Negative for weakness and numbness.       Objective:   Physical Exam  Constitutional: He appears well-developed and well-nourished.  Cardiovascular: Normal rate.     Pulmonary/Chest: Effort normal and breath sounds normal. No respiratory distress. He has no wheezes. He has no rales.  Musculoskeletal: He exhibits no edema.  Excellent range of motion right hip. He has minimal tenderness over the right greater trochanteric region. Very minimal bruise which is fading in this region. Full range of motion right knee. No effusion. No warmth. No erythema.          Assessment & Plan:  Right lateral hip pain following fall. Differential is contusion versus possible bursitis. Given duration of symptoms of pain right hip x-ray. If negative, focus on some icing several times daily.

## 2013-09-29 NOTE — Patient Instructions (Signed)
Hip Bursitis  Bursitis is a swelling and soreness (inflammation) of a fluid-filled sac (bursa). This sac overlies and protects the joints.   CAUSES   · Injury.  · Overuse of the muscles surrounding the joint.  · Arthritis.  · Gout.  · Infection.  · Cold weather.  · Inadequate warm-up and conditioning prior to activities.  The cause may not be known.   SYMPTOMS   · Mild to severe irritation.  · Tenderness and swelling over the outside of the hip.  · Pain with motion of the hip.  · If the bursa becomes infected, a fever may be present. Redness, tenderness, and warmth will develop over the hip.  Symptoms usually lessen in 3 to 4 weeks with treatment, but can come back.  TREATMENT  If conservative treatment does not work, your caregiver may advise draining the bursa and injecting cortisone into the area. This may speed up the healing process. This may also be used as an initial treatment of choice.  HOME CARE INSTRUCTIONS   · Apply ice to the affected area for 15-20 minutes every 3 to 4 hours while awake for the first 2 days. Put the ice in a plastic bag and place a towel between the bag of ice and your skin.  · Rest the painful joint as much as possible, but continue to put the joint through a normal range of motion at least 4 times per day. When the pain lessens, begin normal, slow movements and usual activities to help prevent stiffness of the hip.  · Only take over-the-counter or prescription medicines for pain, discomfort, or fever as directed by your caregiver.  · Use crutches to limit weight bearing on the hip joint, if advised.  · Elevate your painful hip to reduce swelling. Use pillows for propping and cushioning your legs and hips.  · Gentle massage may provide comfort and decrease swelling.  SEEK IMMEDIATE MEDICAL CARE IF:   · Your pain increases even during treatment, or you are not improving.  · You have a fever.  · You have heat and inflammation over the involved bursa.  · You have any other questions or  concerns.  MAKE SURE YOU:   · Understand these instructions.  · Will watch your condition.  · Will get help right away if you are not doing well or get worse.  Document Released: 10/10/2001 Document Revised: 07/13/2011 Document Reviewed: 05/09/2008  ExitCare® Patient Information ©2014 [employer].

## 2013-09-29 NOTE — Progress Notes (Signed)
Pre visit review using our clinic review tool, if applicable. No additional management support is needed unless otherwise documented below in the visit note. 

## 2013-10-10 ENCOUNTER — Ambulatory Visit: Payer: Medicare Other | Admitting: Family Medicine

## 2013-10-10 ENCOUNTER — Encounter: Payer: Self-pay | Admitting: Neurology

## 2013-10-17 ENCOUNTER — Encounter: Payer: Self-pay | Admitting: Family Medicine

## 2013-10-17 ENCOUNTER — Ambulatory Visit (INDEPENDENT_AMBULATORY_CARE_PROVIDER_SITE_OTHER): Payer: Medicare Other | Admitting: Family Medicine

## 2013-10-17 VITALS — BP 136/80 | HR 89 | Wt 187.0 lb

## 2013-10-17 DIAGNOSIS — E785 Hyperlipidemia, unspecified: Secondary | ICD-10-CM

## 2013-10-17 DIAGNOSIS — H6120 Impacted cerumen, unspecified ear: Secondary | ICD-10-CM | POA: Diagnosis not present

## 2013-10-17 DIAGNOSIS — H6121 Impacted cerumen, right ear: Secondary | ICD-10-CM

## 2013-10-17 DIAGNOSIS — K219 Gastro-esophageal reflux disease without esophagitis: Secondary | ICD-10-CM

## 2013-10-17 DIAGNOSIS — I10 Essential (primary) hypertension: Secondary | ICD-10-CM | POA: Diagnosis not present

## 2013-10-17 NOTE — Progress Notes (Signed)
   Subjective:    Patient ID: Eric George, male    DOB: [birth year], 80 y.o.   MRN: [id number]  HPI Patient here for medical followup. He had recent fall with right hip pain. X-rays negative. His hip pain is gradually improving. Ambulating without difficulty. His other chronic problems include history of hypertension, GERD, prostate cancer, history of recurrent small bowel obstruction, history of CVA, dyslipidemia.  Overall doing well. No recent falls.  Has macular degeneration and is followed by ophthalmologist. He has history of chronic hearing loss. Medications reviewed. Compliant with all. He has history of depression treated with sertraline and symptoms are stable. Appetite and weight are stable. No chest pains.  Past Medical History  Diagnosis Date  . Bowel obstruction   . Hydrocephalus   . TBI (traumatic brain injury)   . Colloid cyst of brain   . GERD (gastroesophageal reflux disease)   . Hypertension   . 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 Hyperlipidemia   . Stroke    Past Surgical History  Procedure Laterality Date  . Hernia repair    . Prostatectomy    . Brain surgery    . Tee without cardioversion  03/22/2012    Procedure: TRANSESOPHAGEAL ECHOCARDIOGRAM (TEE);  Surgeon: [name], MD;  Location: [facility] ENDOSCOPY;  Service: Cardiovascular;  Laterality: N/A;    reports that he quit smoking about 47 years ago. His smoking use included Cigarettes. He has a 7.5 pack-year smoking history. He has never used smokeless tobacco. He reports that he does not drink alcohol or use illicit drugs. family history includes Heart disease in his sister. Allergies  Allergen Reactions  . Penicillins Rash      Review of Systems  Constitutional: Negative for fatigue.  Eyes: Negative for visual disturbance.  Respiratory: Negative for cough, chest tightness and shortness of breath.   Cardiovascular: Negative for chest pain, palpitations and leg swelling.  Endocrine:  Negative for polydipsia and polyuria.  Neurological: Negative for dizziness, syncope, weakness, light-headedness and headaches.       Objective:   Physical Exam  Constitutional: He appears well-developed and well-nourished.  HENT:  Cerumen impaction right canal. Removed with curette. Left canal is clear. Eardrums appear normal  Neck: Neck supple. No thyromegaly present.  Cardiovascular: Normal rate and regular rhythm.   Pulmonary/Chest: Effort normal and breath sounds normal. No respiratory distress. He has no wheezes. He has no rales.  Musculoskeletal: He exhibits no edema.          Assessment & Plan:  #1 hypertension. Stable. Continue Avapro. #2 history of GERD. Symptomatically stable. He is followed gastroenterologist. Remains on proton pump inhibitor #3 hyperlipidemia. Continue Lipitor. Check lipids at followup in 6 months #4 cerumen impaction right canal. Removed with curette

## 2013-10-17 NOTE — Progress Notes (Signed)
Pre visit review using our clinic review tool, if applicable. No additional management support is needed unless otherwise documented below in the visit note. 

## 2013-10-25 DIAGNOSIS — H40019 Open angle with borderline findings, low risk, unspecified eye: Secondary | ICD-10-CM | POA: Diagnosis not present

## 2013-12-07 DIAGNOSIS — H6120 Impacted cerumen, unspecified ear: Secondary | ICD-10-CM | POA: Diagnosis not present

## 2013-12-22 ENCOUNTER — Emergency Department (HOSPITAL_COMMUNITY): Payer: Medicare Other

## 2013-12-22 ENCOUNTER — Encounter (HOSPITAL_COMMUNITY): Payer: Self-pay | Admitting: Emergency Medicine

## 2013-12-22 ENCOUNTER — Emergency Department (HOSPITAL_COMMUNITY)
Admission: EM | Admit: 2013-12-22 | Discharge: 2013-12-22 | Disposition: A | Discharge status: Home / Self Care | Payer: Medicare Other | Attending: Emergency Medicine | Admitting: Emergency Medicine

## 2013-12-22 DIAGNOSIS — R110 Nausea: Secondary | ICD-10-CM | POA: Diagnosis not present

## 2013-12-22 DIAGNOSIS — K5900 Constipation, unspecified: Secondary | ICD-10-CM | POA: Diagnosis not present

## 2013-12-22 DIAGNOSIS — Z87820 Personal history of traumatic brain injury: Secondary | ICD-10-CM | POA: Diagnosis not present

## 2013-12-22 DIAGNOSIS — R109 Unspecified abdominal pain: Secondary | ICD-10-CM | POA: Diagnosis not present

## 2013-12-22 DIAGNOSIS — E785 Hyperlipidemia, unspecified: Secondary | ICD-10-CM | POA: Diagnosis not present

## 2013-12-22 DIAGNOSIS — Z87891 Personal history of nicotine dependence: Secondary | ICD-10-CM | POA: Diagnosis not present

## 2013-12-22 DIAGNOSIS — Z79899 Other long term (current) drug therapy: Secondary | ICD-10-CM | POA: Diagnosis not present

## 2013-12-22 DIAGNOSIS — K219 Gastro-esophageal reflux disease without esophagitis: Secondary | ICD-10-CM | POA: Insufficient documentation

## 2013-12-22 DIAGNOSIS — I10 Essential (primary) hypertension: Secondary | ICD-10-CM | POA: Insufficient documentation

## 2013-12-22 DIAGNOSIS — K2990 Gastroduodenitis, unspecified, without bleeding: Secondary | ICD-10-CM | POA: Diagnosis not present

## 2013-12-22 DIAGNOSIS — Z7982 Long term (current) use of aspirin: Secondary | ICD-10-CM | POA: Insufficient documentation

## 2013-12-22 DIAGNOSIS — K5730 Diverticulosis of large intestine without perforation or abscess without bleeding: Secondary | ICD-10-CM | POA: Diagnosis not present

## 2013-12-22 DIAGNOSIS — Z85828 Personal history of other malignant neoplasm of skin: Secondary | ICD-10-CM | POA: Diagnosis not present

## 2013-12-22 DIAGNOSIS — Z8673 Personal history of transient ischemic attack (TIA), and cerebral infarction without residual deficits: Secondary | ICD-10-CM | POA: Insufficient documentation

## 2013-12-22 DIAGNOSIS — Z8669 Personal history of other diseases of the nervous system and sense organs: Secondary | ICD-10-CM | POA: Diagnosis not present

## 2013-12-22 DIAGNOSIS — K2970 Gastritis, unspecified, without bleeding: Secondary | ICD-10-CM | POA: Diagnosis not present

## 2013-12-22 LAB — COMPREHENSIVE METABOLIC PANEL
ALT: 15 U/L (ref 0–53)
AST: 16 U/L (ref 0–37)
Albumin: 3.6 g/dL (ref 3.5–5.2)
Alkaline Phosphatase: 90 U/L (ref 39–117)
Anion gap: 14 (ref 5–15)
BUN: 19 mg/dL (ref 6–23)
CO2: 20 mEq/L (ref 19–32)
Calcium: 9.5 mg/dL (ref 8.4–10.5)
Chloride: 105 mEq/L (ref 96–112)
Creatinine, Ser: 1.03 mg/dL (ref 0.50–1.35)
GFR calc Af Amer: 77 mL/min — ABNORMAL LOW (ref >90)
GFR calc non Af Amer: 66 mL/min — ABNORMAL LOW (ref >90)
Glucose, Bld: 102 mg/dL — ABNORMAL HIGH (ref 70–99)
Potassium: 4 mEq/L (ref 3.7–5.3)
Sodium: 139 mEq/L (ref 137–147)
Total Bilirubin: 0.5 mg/dL (ref 0.3–1.2)
Total Protein: 6.4 g/dL (ref 6.0–8.3)

## 2013-12-22 LAB — CBC WITH DIFFERENTIAL/PLATELET
Basophils Absolute: 0 10*3/uL (ref 0.0–0.1)
Basophils Relative: 0 % (ref 0–1)
Eosinophils Absolute: 0.3 10*3/uL (ref 0.0–0.7)
Eosinophils Relative: 4 % (ref 0–5)
HCT: 42.6 % (ref 39.0–52.0)
Hemoglobin: 14.6 g/dL (ref 13.0–17.0)
Lymphocytes Relative: 21 % (ref 12–46)
Lymphs Abs: 1.6 10*3/uL (ref 0.7–4.0)
MCH: 29.4 pg (ref 26.0–34.0)
MCHC: 34.3 g/dL (ref 30.0–36.0)
MCV: 85.7 fL (ref 78.0–100.0)
Monocytes Absolute: 0.6 10*3/uL (ref 0.1–1.0)
Monocytes Relative: 8 % (ref 3–12)
Neutro Abs: 5.2 10*3/uL (ref 1.7–7.7)
Neutrophils Relative %: 67 % (ref 43–77)
Platelets: 193 10*3/uL (ref 150–400)
RBC: 4.97 MIL/uL (ref 4.22–5.81)
RDW: 13.1 % (ref 11.5–15.5)
WBC: 7.8 10*3/uL (ref 4.0–10.5)

## 2013-12-22 LAB — URINALYSIS, ROUTINE W REFLEX MICROSCOPIC
Bilirubin Urine: NEGATIVE
Glucose, UA: NEGATIVE mg/dL
Hgb urine dipstick: NEGATIVE
Ketones, ur: NEGATIVE mg/dL
Leukocytes, UA: NEGATIVE
Nitrite: NEGATIVE
Protein, ur: NEGATIVE mg/dL
Specific Gravity, Urine: 1.006 (ref 1.005–1.030)
Urobilinogen, UA: 0.2 mg/dL (ref 0.0–1.0)
pH: 8 (ref 5.0–8.0)

## 2013-12-22 LAB — POC OCCULT BLOOD, ED: Fecal Occult Bld: NEGATIVE

## 2013-12-22 LAB — LIPASE, BLOOD: Lipase: 43 U/L (ref 11–59)

## 2013-12-22 MED ORDER — IOHEXOL 300 MG/ML  SOLN
25.0000 mL | Freq: Once | INTRAMUSCULAR | Status: AC | PRN
  Administered 2013-12-22: 25 mL via ORAL

## 2013-12-22 MED ORDER — POLYETHYLENE GLYCOL 3350 17 G PO PACK: 17.0000 g | PACK | Freq: Every day | ORAL | Status: DC

## 2013-12-22 MED ORDER — IOHEXOL 300 MG/ML  SOLN
100.0000 mL | Freq: Once | INTRAMUSCULAR | Status: AC | PRN
  Administered 2013-12-22: 100 mL via INTRAVENOUS

## 2013-12-22 NOTE — ED Notes (Signed)
Ambulatory to br gait steady

## 2013-12-22 NOTE — ED Notes (Signed)
Paramedics report that pt. Has not had a BM Since Wednesday.  Wife reports that he woke up with nausea,  Pt. Has not had any vomiting.  Pt. Denies any pain.  Pt. Received Zofran 4mg  Iv en route. Pt. Denies any nausea at present.  Pt. Has a TBI/cyst on the brain.  Pt. Is confused and has expressive dysphagia which is his normal.  Pt. Is very pleasant and follow commands.  Alert to self and family.

## 2013-12-22 NOTE — ED Provider Notes (Signed)
CSN: [id number]     Arrival date & time 12/22/13  0818 History   First MD Initiated Contact with Patient 12/22/13 0825     Chief Complaint  Patient presents with  . Constipation   The history is provided by the patient. No language interpreter was used.   Patient is an 80 y.o. Male who presents to the ED with nausea, dry heaves, and constipation.  Patient states that for the past 2-3 days he has had really hard pellet like stools which he feels are unsatisfying.  This is very different from the normal stool pattern which is 3 loose formed stools per day.  Wife reports the patient had a lot of gas and burping last night.  Patient woke up this morning and had some nausea and dry heaving.  Patient has a history of 3 SBO in the past and his wife was very worried that this was the start of another SBO.   Patient denies fever, chills, nausea, vomiting, diarrhea, melena, hematochezia, chest pain, SOB, leg swelling, PND, orthopnea.  All other ROS are negative.     Past Medical History  Diagnosis Date  . Bowel obstruction   . Hydrocephalus   . TBI (traumatic brain injury)   . Colloid cyst of brain   . GERD (gastroesophageal reflux disease)   . Hypertension   . 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 Hyperlipidemia   . Stroke    Past Surgical History  Procedure Laterality Date  . Hernia repair    . Prostatectomy    . Brain surgery    . Tee without cardioversion  03/22/2012    Procedure: TRANSESOPHAGEAL ECHOCARDIOGRAM (TEE);  Surgeon: [name], MD;  Location: [facility] ENDOSCOPY;  Service: Cardiovascular;  Laterality: N/A;   Family History  Problem Relation Age of Onset  . Heart disease Sister    History  Substance Use Topics  . Smoking status: Former Smoker -- 0.50 packs/day for 15 years    Types: Cigarettes    Quit date: 09/30/1966  . Smokeless tobacco: Never Used  . Alcohol Use: No    Review of Systems See HPI   Allergies  Penicillins  Home Medications   Prior to  Admission medications   Medication Sig Start Date End Date Taking? Authorizing Provider  aspirin 325 MG tablet Take 1 tablet (325 mg total) by mouth daily. 03/23/12  Yes Eugenie Filler, MD  atorvastatin (LIPITOR) 40 MG tablet Take 40 mg by mouth daily.   Yes Historical Provider, MD  fexofenadine (ALLEGRA) 60 MG tablet Take 60 mg by mouth daily as needed for allergies or rhinitis.   Yes Historical Provider, MD  irbesartan (AVAPRO) 150 MG tablet Take 150 mg by mouth daily.   Yes Historical Provider, MD  Multiple Vitamins-Minerals (MACULAR VITAMIN BENEFIT) TABS Take 1 capsule by mouth daily.   Yes Historical Provider, MD  omeprazole-sodium bicarbonate (ZEGERID) 40-1100 MG per capsule Take 1 capsule by mouth 2 (two) times daily.     Yes Historical Provider, MD  Probiotic Product (ALIGN) 4 MG CAPS Take 1 capsule by mouth daily.   Yes Historical Provider, MD  sertraline (ZOLOFT) 50 MG tablet Take 50 mg by mouth daily.   Yes Historical Provider, MD  polyethylene glycol (MIRALAX / GLYCOLAX) packet Take 17 g by mouth daily. 12/22/13   Lanny Donoso A Forcucci, PA-C   BP 156/80  Pulse 64  Temp(Src) 97.6 F (36.4 C) (Oral)  Resp 20  SpO2 97% Physical Exam  Nursing note and  vitals reviewed. Constitutional: He is oriented to person, place, and time. He appears well-developed and well-nourished. No distress.  HENT:  Head: Normocephalic and atraumatic.  Mouth/Throat: Oropharynx is clear and moist. No oropharyngeal exudate.  Eyes: Conjunctivae and EOM are normal. Pupils are equal, round, and reactive to light. No scleral icterus.  Neck: Normal range of motion. Neck supple. No JVD present. No thyromegaly present.  Cardiovascular: Normal rate, regular rhythm, normal heart sounds and intact distal pulses.  Exam reveals no gallop and no friction rub.   No murmur heard. Pulmonary/Chest: Effort normal and breath sounds normal. No respiratory distress. He has no wheezes. He has no rales. He exhibits no tenderness.   Abdominal: Soft. Bowel sounds are normal. He exhibits no distension and no mass. There is no tenderness. There is no rebound and no guarding.  Musculoskeletal: Normal range of motion.  Lymphadenopathy:    He has no cervical adenopathy.  Neurological: He is alert and oriented to person, place, and time. No cranial nerve deficit. Coordination normal.  Skin: Skin is warm and dry. He is not diaphoretic.  Psychiatric: He has a normal mood and affect. His behavior is normal. Judgment and thought content normal.    ED Course  Procedures (including critical care time) Labs Review Labs Reviewed  COMPREHENSIVE METABOLIC PANEL - Abnormal; Notable for the following:    Glucose, Bld 102 (*)    GFR calc non Af Amer 66 (*)    GFR calc Af Amer 77 (*)    All other components within normal limits  CBC WITH DIFFERENTIAL  URINALYSIS, ROUTINE W REFLEX MICROSCOPIC  LIPASE, BLOOD  POC OCCULT BLOOD, ED    Imaging Review Ct Abdomen Pelvis W Contrast  12/22/2013   CLINICAL DATA:  No bowel movement for 3 days now experiencing nausea  EXAM: CT ABDOMEN AND PELVIS WITH CONTRAST  TECHNIQUE: Multidetector CT imaging of the abdomen and pelvis was performed using the standard protocol following bolus administration of intravenous contrast.  CONTRAST:  175mL OMNIPAQUE IOHEXOL 300 MG/ML  SOLN  COMPARISON:  Acute abdominal series of December 22, 2013  FINDINGS: The small and large bowel exhibit no evidence of ileus nor obstruction. There is a normal colonic stool burden. There are scattered colonic diverticula. There is no evidence of colitis or diverticulitis or enteritis. The appendix is normal. The rectum contains a small amount of stool and a moderate amount of gas.  The patient has undergone previous prostatectomy. The urinary bladder is normal. There is no free pelvic fluid. The liver, gallbladder, pancreas, spleen, nondistended stomach, adrenal glands, and kidneys are normal. A focal cortical defect in the lower pole of  the right kidney is consistent with previous vascular or infectious insult. The periaortic and pericaval regions are normal. There is mild failure to taper of the caliber of the abdominal aorta but there is no evidence of an aneurysm. There is no inguinal nor umbilical hernia.  There is mild degenerative disc change at multiple lumbar levels. There is no compression fracture. The bony pelvis is unremarkable. The lung bases are clear.  IMPRESSION: 1. There is no evidence of bowel obstruction, inflammation, nor abnormally increased stool burden. 2. There is no acute hepatobiliary nor acute urinary tract abnormality. 3. There is no intra-abdominal or pelvic lymphadenopathy, free fluid, or acute inflammation.   Electronically Signed   By: David  Martinique   On: 12/22/2013 13:45   Dg Abd 2 Views  12/22/2013   CLINICAL DATA:  Abdominal pain  EXAM: ABDOMEN -  2 VIEW  COMPARISON:  09/13/2011  FINDINGS: No free intraperitoneal gas. No disproportionate dilatation of bowel. Postoperative changes in the pelvis.  IMPRESSION: Nonobstructive bowel gas pattern.  No free intraperitoneal gas.   Electronically Signed   By: Maryclare Bean M.D.   On: 12/22/2013 08:51     EKG Interpretation None      MDM   Final diagnoses:  Constipation, unspecified constipation type   Patient is an 80 y.o. Male who presents to the ED with gas, dry heaving, and hard stools.  Physical exam is benign at this time.  CBC, CMP, Lipase, and UA are unremarkable.  Plain film of the abdomen was negative at this time for bowel obstruction.  Given history and age patient had CT of the abdomen and pelvis which was negative for bowel obstruction and any other acute processes.  Patient is stable for discharge at this time.  I have instructed to the patient to use miralax as needed to regulate his bowels.  I have also recommended that the patient attempt to eat a high fiber diet and drink lots of water.  Patient states understanding and agreement to the above  plan.  Patient was discussed at length with Dr. Thurnell Garbe who agrees with the above plan and workup.       Cherylann Parr, PA-C 12/22/13 1437

## 2013-12-22 NOTE — ED Notes (Signed)
Pt to ct 

## 2013-12-22 NOTE — ED Notes (Signed)
Ct contrast completed

## 2013-12-22 NOTE — ED Notes (Signed)
Patient transported to CT 

## 2013-12-22 NOTE — Discharge Instructions (Signed)

## 2013-12-22 NOTE — ED Notes (Signed)
Pt returned from ct

## 2013-12-25 NOTE — ED Provider Notes (Signed)
Medical screening examination/treatment/procedure(s) were performed by non-physician practitioner and as supervising physician I was immediately available for consultation/collaboration.   EKG Interpretation None        Francine Graven, DO 12/25/13 1616

## 2013-12-28 ENCOUNTER — Ambulatory Visit (INDEPENDENT_AMBULATORY_CARE_PROVIDER_SITE_OTHER): Payer: Medicare Other | Admitting: Family Medicine

## 2013-12-28 ENCOUNTER — Encounter: Payer: Self-pay | Admitting: Family Medicine

## 2013-12-28 VITALS — BP 136/90 | HR 79 | Temp 97.5°F | Wt 183.0 lb

## 2013-12-28 DIAGNOSIS — R110 Nausea: Secondary | ICD-10-CM

## 2013-12-28 DIAGNOSIS — K5900 Constipation, unspecified: Secondary | ICD-10-CM

## 2013-12-28 DIAGNOSIS — R197 Diarrhea, unspecified: Secondary | ICD-10-CM

## 2013-12-28 NOTE — Patient Instructions (Signed)
High fiber diet- 25 to 30 grams per day Walk regularly for exercise Plenty of fluids. High-Fiber Diet Fiber is found in fruits, vegetables, and grains. A high-fiber diet encourages the addition of more whole grains, legumes, fruits, and vegetables in your diet. The recommended amount of fiber for adult males is 38 g per day. For adult females, it is 25 g per day. Pregnant and lactating women should get 28 g of fiber per day. If you have a digestive or bowel problem, ask your caregiver for advice before adding high-fiber foods to your diet. Eat a variety of high-fiber foods instead of only a select few type of foods.  PURPOSE  To increase stool bulk.  To make bowel movements more regular to prevent constipation.  To lower cholesterol.  To prevent overeating. WHEN IS THIS DIET USED?  It may be used if you have constipation and hemorrhoids.  It may be used if you have uncomplicated diverticulosis (intestine condition) and irritable bowel syndrome.  It may be used if you need help with weight management.  It may be used if you want to add it to your diet as a protective measure against atherosclerosis, diabetes, and cancer. SOURCES OF FIBER  Whole-grain breads and cereals.  Fruits, such as apples, oranges, bananas, berries, prunes, and pears.  Vegetables, such as green peas, carrots, sweet potatoes, beets, broccoli, cabbage, spinach, and artichokes.  Legumes, such split peas, soy, lentils.  Almonds. FIBER CONTENT IN FOODS Starches and Grains / Dietary Fiber (g)  Cheerios, 1 cup / 3 g  Corn Flakes cereal, 1 cup / 0.7 g  Rice crispy treat cereal, 1 cup / 0.3 g  Instant oatmeal (cooked),  cup / 2 g  Frosted wheat cereal, 1 cup / 5.1 g  Brown, long-grain rice (cooked), 1 cup / 3.5 g  White, long-grain rice (cooked), 1 cup / 0.6 g  Enriched macaroni (cooked), 1 cup / 2.5 g Legumes / Dietary Fiber (g)  Baked beans (canned, plain, or vegetarian),  cup / 5.2 g  Kidney  beans (canned),  cup / 6.8 g  Pinto beans (cooked),  cup / 5.5 g Breads and Crackers / Dietary Fiber (g)  Plain or honey graham crackers, 2 squares / 0.7 g  Saltine crackers, 3 squares / 0.3 g  Plain, salted pretzels, 10 pieces / 1.8 g  Whole-wheat bread, 1 slice / 1.9 g  White bread, 1 slice / 0.7 g  Raisin bread, 1 slice / 1.2 g  Plain bagel, 3 oz / 2 g  Flour tortilla, 1 oz / 0.9 g  Corn tortilla, 1 small / 1.5 g  Hamburger or hotdog bun, 1 small / 0.9 g Fruits / Dietary Fiber (g)  Apple with skin, 1 medium / 4.4 g  Sweetened applesauce,  cup / 1.5 g  Banana,  medium / 1.5 g  Grapes, 10 grapes / 0.4 g  Orange, 1 small / 2.3 g  Raisin, 1.5 oz / 1.6 g  Melon, 1 cup / 1.4 g Vegetables / Dietary Fiber (g)  Green beans (canned),  cup / 1.3 g  Carrots (cooked),  cup / 2.3 g  Broccoli (cooked),  cup / 2.8 g  Peas (cooked),  cup / 4.4 g  Mashed potatoes,  cup / 1.6 g  Lettuce, 1 cup / 0.5 g  Corn (canned),  cup / 1.6 g  Tomato,  cup / 1.1 g Document Released: 04/20/2005 Document Revised: 10/20/2011 Document Reviewed: 07/23/2011 [employer] Patient Information 2015 [place], [place].  This information is not intended to replace advice given to you by your health care provider. Make sure you discuss any questions you have with your health care provider.

## 2013-12-28 NOTE — Progress Notes (Signed)
   Subjective:    Patient ID: Eric George, male    DOB: 09-24-1930, 80 y.o.   MRN: [id number]  HPI Patient seen for ER followup. He has prior history of small bowel obstruction x3 several years ago. He had presented with several days of small hard pellet-like stools which is unusual for him. He usually has about 2-3 loose formed stools per day.  He had some nausea but no vomiting and wife was concerned he had recurrent small bowel obstruction. He was taken to ER on 12/22/2013. Labs were unremarkable. CT abdomen and pelvis revealed no evidence for SBO and no acute abnormalities. He was advised to take some MiraLax which did seem to help him have a normal bowel movement initially. He now has some intermittent nonbloody diarrhea alternating somewhat with constipation.  Denies any fever or chills. Appetite is fair. He is trying to get high fiber diet but is not taking any fiber supplement.  Past Medical History  Diagnosis Date  . Bowel obstruction   . Hydrocephalus   . TBI (traumatic brain injury)   . Colloid cyst of brain   . GERD (gastroesophageal reflux disease)   . Hypertension   . 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 Hyperlipidemia   . Stroke    Past Surgical History  Procedure Laterality Date  . Hernia repair    . Prostatectomy    . Brain surgery    . Tee without cardioversion  03/22/2012    Procedure: TRANSESOPHAGEAL ECHOCARDIOGRAM (TEE);  Surgeon: [name], MD;  Location: [facility] ENDOSCOPY;  Service: Cardiovascular;  Laterality: N/A;    reports that he quit smoking about 47 years ago. His smoking use included Cigarettes. He has a 7.5 pack-year smoking history. He has never used smokeless tobacco. He reports that he does not drink alcohol or use illicit drugs. family history includes Heart disease in his sister. Allergies  Allergen Reactions  . Penicillins Rash      Review of Systems  Constitutional: Negative for fever, appetite change and unexpected weight  change.  Respiratory: Negative for cough and shortness of breath.   Cardiovascular: Negative for chest pain.  Gastrointestinal: Positive for diarrhea and constipation. Negative for nausea, vomiting, abdominal pain and blood in stool.  Endocrine: Negative for polydipsia and polyuria.  Genitourinary: Negative for dysuria.       Objective:   Physical Exam  Constitutional: He appears well-developed and well-nourished.  Cardiovascular: Normal rate and regular rhythm.   Pulmonary/Chest: Effort normal and breath sounds normal. No respiratory distress. He has no rales.  Abdominal: Soft. Bowel sounds are normal. He exhibits no distension and no mass. There is no tenderness. There is no rebound and no guarding.          Assessment & Plan:  Recent nausea, constipation, and now intermittent diarrhea in a patient with past history of small bowel obstruction. CT recently but did not reveal any evidence for small bowel obstruction. We've recommended increased fluid intake, 25 to 30 g of fiber per day, regular walking. Will consider fiber supplement such as Metamucil or Citrucel.

## 2013-12-28 NOTE — Progress Notes (Signed)
Pre visit review using our clinic review tool, if applicable. No additional management support is needed unless otherwise documented below in the visit note. 

## 2014-01-28 ENCOUNTER — Other Ambulatory Visit: Payer: Self-pay | Admitting: Family Medicine

## 2014-02-06 DIAGNOSIS — H903 Sensorineural hearing loss, bilateral: Secondary | ICD-10-CM | POA: Diagnosis not present

## 2014-02-06 DIAGNOSIS — H6693 Otitis media, unspecified, bilateral: Secondary | ICD-10-CM | POA: Diagnosis not present

## 2014-02-16 ENCOUNTER — Ambulatory Visit (INDEPENDENT_AMBULATORY_CARE_PROVIDER_SITE_OTHER): Payer: Medicare Other

## 2014-02-16 DIAGNOSIS — Z23 Encounter for immunization: Secondary | ICD-10-CM

## 2014-02-21 DIAGNOSIS — H903 Sensorineural hearing loss, bilateral: Secondary | ICD-10-CM | POA: Diagnosis not present

## 2014-02-21 DIAGNOSIS — H6693 Otitis media, unspecified, bilateral: Secondary | ICD-10-CM | POA: Diagnosis not present

## 2014-02-22 DIAGNOSIS — H903 Sensorineural hearing loss, bilateral: Secondary | ICD-10-CM | POA: Diagnosis not present

## 2014-02-24 ENCOUNTER — Other Ambulatory Visit: Payer: Self-pay | Admitting: Family Medicine

## 2014-02-28 ENCOUNTER — Ambulatory Visit (INDEPENDENT_AMBULATORY_CARE_PROVIDER_SITE_OTHER): Payer: Medicare Other | Admitting: Neurology

## 2014-02-28 ENCOUNTER — Encounter: Payer: Self-pay | Admitting: Neurology

## 2014-02-28 ENCOUNTER — Ambulatory Visit: Payer: BLUE CROSS/BLUE SHIELD | Admitting: Neurology

## 2014-02-28 VITALS — BP 116/71 | HR 72 | Ht 75.0 in | Wt 190.0 lb

## 2014-02-28 DIAGNOSIS — R413 Other amnesia: Secondary | ICD-10-CM

## 2014-02-28 MED ORDER — SERTRALINE HCL 100 MG PO TABS: 100.0000 mg | ORAL_TABLET | Freq: Every day | ORAL | Status: DC

## 2014-02-28 NOTE — Patient Instructions (Signed)
I had a long discussion with the patient and his wife regarding his recent progressive memory loss and mild cognitive impairment. We discussed the examination findings, differential diagnosis and plan for evaluation and treatment. I recommend checking dementia panel labs, EEG and MRI scan. Increase Zoloft to 100 mg daily if tolerated. Return for followup in 2 months and if there is no improvement or further decline in memory and cognition may start Aricept.  Mild Neurocognitive Disorder Mild neurocognitive disorder (formerly known as mild cognitive impairment) is a mental disorder. It is a slight abnormal decrease in mental function. The areas of mental function affected may include memory, thought, communication, behavior, and completion of tasks. The decrease is noticeable and measurable but for the most part does not interfere with your daily activities. Mild neurocognitive disorder typically occurs in people older than 60 years but can occur earlier. It is not as serious as major neurocognitive disorder (formerly known as dementia) but may lead to a more serious neurocognitive disorder. However, in some cases the condition does not get worse. A few people with this disorder even improve. CAUSES  There are a number of different causes of mild neurocognitive disorder:   Brain disorders associated with abnormal protein deposits, such as Alzheimer's disease, Pick's disease, and Lewy body disease.  Brain disorders associated with abnormal movement, such as Parkinson's disease and Huntington's disease.  Diseases affecting blood vessels in the brain and resulting in mini-strokes.  Certain infections, such as human immunodeficiency virus (HIV) infection.  Traumatic brain injury.  Other medical conditions such as brain tumors, underactive thyroid (hypothyroidism), and vitamin B12 deficiency.  Use of certain prescription medicine and "recreational" drugs. SYMPTOMS  Symptoms of mild neurocognitive  disorder include:  Difficulty remembering. You may forget details of recent events, names, or phone numbers. You may forget important social events and appointments or repeatedly forget where you put your car keys.  Difficulty thinking and solving problems. You may have trouble with complex tasks such as paying bills or driving in unfamiliar locations.  Difficulty communicating. You may have trouble finding the right word, naming an object, forming a sentence that makes sense, or understanding what you read or hear.  Changes in your behavior or personality. You may lose interest in the things that you used to enjoy or withdraw from social situations. You may get angry more easily than usual. You may act before thinking. You may do things in public that you would not usually do. You may hear or see things that are not real (hallucinations). You may believe falsely that others are trying to hurt you (paranoia). DIAGNOSIS Mild neurocognitive disorder is diagnosed through an assessment by your health care provider. Your health care provider will ask you and your family, friends, or coworkers questions about your symptoms. He or she will ask how often the symptoms occur, how long they have been occurring, whether they are getting worse, and the effect they are having on your life. Your health care provider may refer you to a neurologist or mental health specialist for a detailed evaluation of your mental functions (neuropsychological testing).  To identify the cause of your mild neurocognitive disorder, your health care provider may:  Obtain a detailed medical history.  Ask about alcohol and drug use, including prescription medicine.  Perform a physical exam.  Order blood tests and brain imaging exams. TREATMENT  Mild neurocognitive disorder caused by infections, use of certain medicines or "recreational" drugs, and certain medical conditions may improve with treatment of the  condition that is  causing the disorder. Mild neurocognitive disorder resulting from other causes generally does not improve and may worsen. In these cases, the goal of treatment is to slow progression of the disorder and help you cope with the loss of mental function. Treatments in these cases include:   Medicine. Medicine helps mainly with memory loss and behavioral symptoms.   Talk therapy. Talk therapy provides education, emotional support, memory aids, and other ways of making up for decreases in mental function.   Lifestyle changes. These include regular exercise, a healthy diet (including essential omega-3 fatty acids), intellectual stimulation, and increased social interaction. Document Released: 12/21/2012 Document Revised: 09/04/2013 Document Reviewed: 12/21/2012 [facility] Patient Information 2015 [place], [place]. This information is not intended to replace advice given to you by your health care provider. Make sure you discuss any questions you have with your health care provider.

## 2014-02-28 NOTE — Progress Notes (Signed)
[facility]  PATIENT: Eric George DOB: 06-07-1930   REASON FOR VISIT: Followup for history of stroke    HISTORY OF PRESENT ILLNESS: Eric George, 80 year old white male returns for followup. He was last seen in the office 08/25/2012. He has a history of sudden onset of speech difficulties 03/20/2012. No obvious facial weakness or focal abnormalities. MRI of the brain showed an acute infarct in the left posterior temporal and parietal region. Carotid Dopplers without significant stenosis. On return visit today he denies further stroke or TIA symptoms. He continues to have some paraphrasing difficulties with his speech. He sometimes have problems getting his words out. He is continuing to walk 4 miles daily. No new neurologic complaints   HISTORY: He presented to the hospital 03/20/2012 the sudden onset of speech difficulties noted by his wife. He was not responding appropriately to questions and had slurred speech without obvious facial weakness or focal abnormalities. CT of the head showed nothing acute but MRI scan of the brain showed an acute infarct in the left posterior temporal and parietal region he was outside the window for TPA. 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 He was started on aspirin and has done well since that time. He has not had recurrent stroke or TIA symptoms. He states he speech is completely recovered to normal but his wife says that intermittently during the day he may struggle with a few words. His most recent lipid profile showed LDL of 90 and hemoglobin A1c of 5.9 He has no new neurologic complaints.  UPDATE 02/28/2014 : she returns for followup of the last visit 1 year ago. He is accompanied by his wife who states that she's noticed gradually progressive mild memory loss as well as comprehension and cognitive difficulties over the last 1 year. She  states that he does have decreased hearing which often contributes to his lack of understanding. He also has chronic depression which also may be suboptimally treated. He was started on fish oil for this but did not tolerated due to side effects.he does not have any behavioral He has no concerns of hallucinations. He has had no falls and has good balance. He denies any symptoms of stroke seizures or loss of consciousness. Is currently on Zoloft 50 mg a day. He remains on aspirin for stroke prevention as well as Lipitor for hyperlipidemia and Avapro for hypertension both of which apparently are well controlled.  REVIEW OF SYSTEMS:  Hearing loss, ringing in the ears, constipation, diarrhea, restless legs, joint pain, easy bruising memory loss and speech difficulty  ALLERGIES: Allergies  Allergen Reactions  . Penicillins Rash    HOME MEDICATIONS: Outpatient Prescriptions Prior to Visit  Medication Sig Dispense Refill  . aspirin 325 MG tablet Take 1 tablet (325 mg total) by mouth daily.  31 tablet  0  . atorvastatin (LIPITOR) 40 MG tablet Take 40 mg by mouth daily.      . fexofenadine (ALLEGRA) 60 MG tablet Take 60 mg by mouth daily as needed for allergies or rhinitis.      Marland Kitchen irbesartan (AVAPRO) 150 MG tablet Take 150 mg by mouth daily.      . Multiple Vitamins-Minerals (MACULAR VITAMIN BENEFIT) TABS Take 1 capsule by mouth daily.      Marland Kitchen omeprazole-sodium bicarbonate (ZEGERID) 40-1100 MG per capsule Take 1 capsule by mouth 2 (two) times daily.        . Probiotic Product (ALIGN)  4 MG CAPS Take 1 capsule by mouth daily.      . polyethylene glycol (MIRALAX / GLYCOLAX) packet Take 17 g by mouth daily.  14 each  0  . sertraline (ZOLOFT) 50 MG tablet Take 50 mg by mouth daily.      . irbesartan (AVAPRO) 150 MG tablet TAKE 1 TABLET DAILY  90 tablet  0  . sertraline (ZOLOFT) 50 MG tablet TAKE 1 TABLET DAILY  90 tablet  1   No facility-administered medications prior to visit.    PAST MEDICAL  HISTORY: Past Medical History  Diagnosis Date  . Bowel obstruction   . Hydrocephalus   . TBI (traumatic brain injury)   . Colloid cyst of brain   . GERD (gastroesophageal reflux disease)   . Hypertension   . 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 Hyperlipidemia   . Stroke     PAST SURGICAL HISTORY: Past Surgical History  Procedure Laterality Date  . Hernia repair    . Prostatectomy    . Brain surgery    . Tee without cardioversion  03/22/2012    Procedure: TRANSESOPHAGEAL ECHOCARDIOGRAM (TEE);  Surgeon: [name], MD;  Location: [facility] ENDOSCOPY;  Service: Cardiovascular;  Laterality: N/A;    FAMILY HISTORY: Family History  Problem Relation Age of Onset  . Heart disease Sister     SOCIAL HISTORY: History   Social History  . Marital Status: Married    Spouse Name: [name]     Number of Children: 2  . Years of Education: N/A   Occupational History  . retired    Social History Main Topics  . Smoking status: Former Smoker -- 0.50 packs/day for 15 years    Types: Cigarettes    Quit date: 09/30/1966  . Smokeless tobacco: Never Used  . Alcohol Use: No  . Drug Use: No  . Sexual Activity: No   Other Topics Concern  . Not on file   Social History Narrative   Patient lives at home with his wife [name]   Patient is right handed.    He is retired and has a Gaffer.    Patient has 2 children.    Patient drinks 5 or more cups daily.     PHYSICAL EXAM  Filed Vitals:   02/28/14 1453  BP: 116/71  Pulse: 72  Height: 6\' 3"  (1.905 m)  Weight: 190 lb (86.183 kg)   Body mass index is 23.75 kg/(m^2).  Generalized: Well developed  Elderly Caucasian male, in no acute distress  Head: normocephalic and atraumatic,. Oropharynx benign  Neck: Supple, no carotid bruits  Cardiac: Regular rate rhythm, no murmur  Musculoskeletal: No deformity   Neurological examination   Mentation: Alert oriented to time, place, history taking.MMSE  21/30 ( decline from  25/30 last visit) missing items in orientation and recall  .animal naming test 9 only. Able to copy intersecting pentagons. Clock drawing 3/4. Geriatric depression scale 9 history of mild depression.Follows all commands, occasional paraphrasing difficulty, word finding difficulties.   Cranial nerve II-XII: Pupils were equal round reactive to light extraocular movements were full, visual field were full on confrontational test. Facial sensation and strength were normal. hearing was intact to finger rubbing bilaterally. Tongue protrusion into cheek strength was normal. Motor: normal bulk and tone, full strength in the BUE, BLE, fine finger movements normal, no pronator drift. No focal weakness Sensory: normal and symmetric to light touch, pinprick, and  vibration  Coordination: finger-nose-finger, heel-to-shin bilaterally, no dysmetria Reflexes: Brachioradialis  2/2, biceps 2/2, triceps 2/2, patellar 2/2, Achilles 2/2, plantar responses were flexor bilaterally. Gait and Station: Rising up from seated position without assistance, normal stance, without trunk ataxia, moderate stride, good arm swing, smooth turning, able to perform tiptoe, and heel walking without difficulty. Tandem gait steady  DIAGNOSTIC DATA (LABS, IMAGING, TESTING) -None to review    ASSESSMENT AND PLAN  80 y.o. year old male  has a past medical history of left posterior temporal and parietal infarct November 0923 likely of embolic origin but without definite identified source. Vascular risk factors of hypertension and hyperlipidemia .mildmemory loss and cognitive worsening in the last 1 yearlikely mild cognitive impairment versus early dementia. Suboptimally treated underlying depression may be contributing. I had a long discussion with the patient and his wife regarding his recent progressive memory loss and mild cognitive impairment. We discussed the examination findings, differential diagnosis and plan for evaluation and  treatment. I recommend checking dementia panel labs, EEG and MRI scan. Increase Zoloft to 100 mg daily if tolerated. Return for followup in 2 months and if there is no improvement or further decline in memory and cognition may start Aricept.     Antony Contras, MD [facility] [address] [place], [place] [address] [phone]

## 2014-03-01 LAB — RPR: RPR: NONREACTIVE

## 2014-03-01 LAB — TSH: TSH: 3.15 u[IU]/mL (ref 0.450–4.500)

## 2014-03-01 LAB — VITAMIN B12: Vitamin B-12: 801 pg/mL (ref 211–946)

## 2014-03-02 ENCOUNTER — Telehealth: Payer: Self-pay | Admitting: *Deleted

## 2014-03-02 NOTE — Telephone Encounter (Signed)
Message copied by Emeline General on Fri Mar 02, 2014  8:09 AM ------      Message from: [place], [place] P      Created: Thu Mar 01, 2014  7:43 PM       Kindly inform patient of normal lab results-thyroid, b12 and syphilis test ------

## 2014-03-02 NOTE — Telephone Encounter (Signed)
Spoke with patient's wife and informed her of normal lab results, she verbalized understanding and had no further questions or concerns.

## 2014-03-12 ENCOUNTER — Other Ambulatory Visit (INDEPENDENT_AMBULATORY_CARE_PROVIDER_SITE_OTHER): Payer: Medicare Other | Admitting: Radiology

## 2014-03-12 DIAGNOSIS — R413 Other amnesia: Secondary | ICD-10-CM

## 2014-03-15 ENCOUNTER — Telehealth: Payer: Self-pay

## 2014-03-15 NOTE — Telephone Encounter (Signed)
Spoke to spouse. Gave EEG results.

## 2014-03-15 NOTE — Telephone Encounter (Signed)
-----   Message from Antony Contras, MD sent at 03/13/2014  5:26 PM EST ----- Kindly inform patient that EEG was normal

## 2014-03-19 ENCOUNTER — Encounter: Payer: Self-pay | Admitting: Neurology

## 2014-03-21 ENCOUNTER — Telehealth: Payer: Self-pay | Admitting: Family Medicine

## 2014-03-21 NOTE — Telephone Encounter (Signed)
Patient's spouse would like to talk to you about recommendations made by Dr. Leonie Man, she wants to go over what was discussed with them.

## 2014-03-22 NOTE — Telephone Encounter (Signed)
Spoke with wife and have recommended they go ahead and increase Zoloft to 100 mg as per Dr Leonie Man instruction.

## 2014-04-11 ENCOUNTER — Ambulatory Visit
Admission: RE | Admit: 2014-04-11 | Discharge: 2014-04-11 | Disposition: A | Discharge status: Home / Self Care | Payer: Medicare Other | Source: Ambulatory Visit | Attending: Neurology | Admitting: Neurology

## 2014-04-11 DIAGNOSIS — R413 Other amnesia: Secondary | ICD-10-CM | POA: Diagnosis not present

## 2014-04-11 MED ORDER — GADOBENATE DIMEGLUMINE 529 MG/ML IV SOLN
17.0000 mL | Freq: Once | INTRAVENOUS | Status: AC | PRN
  Administered 2014-04-11: 17 mL via INTRAVENOUS

## 2014-04-16 ENCOUNTER — Encounter: Payer: Self-pay | Admitting: Neurology

## 2014-04-17 ENCOUNTER — Encounter: Payer: Self-pay | Admitting: Family Medicine

## 2014-04-17 ENCOUNTER — Ambulatory Visit (INDEPENDENT_AMBULATORY_CARE_PROVIDER_SITE_OTHER): Payer: Medicare Other | Admitting: Family Medicine

## 2014-04-17 VITALS — BP 130/68 | HR 84 | Temp 97.5°F | Ht 75.0 in | Wt 187.0 lb

## 2014-04-17 DIAGNOSIS — Z0000 Encounter for general adult medical examination without abnormal findings: Secondary | ICD-10-CM

## 2014-04-17 DIAGNOSIS — E785 Hyperlipidemia, unspecified: Secondary | ICD-10-CM | POA: Diagnosis not present

## 2014-04-17 DIAGNOSIS — Z23 Encounter for immunization: Secondary | ICD-10-CM | POA: Diagnosis not present

## 2014-04-17 DIAGNOSIS — I10 Essential (primary) hypertension: Secondary | ICD-10-CM | POA: Diagnosis not present

## 2014-04-17 DIAGNOSIS — R413 Other amnesia: Secondary | ICD-10-CM

## 2014-04-17 LAB — HEPATIC FUNCTION PANEL
ALT: 21 U/L (ref 0–53)
AST: 21 U/L (ref 0–37)
Albumin: 4.1 g/dL (ref 3.5–5.2)
Alkaline Phosphatase: 83 U/L (ref 39–117)
Bilirubin, Direct: 0.1 mg/dL (ref 0.0–0.3)
Total Bilirubin: 0.6 mg/dL (ref 0.2–1.2)
Total Protein: 6.8 g/dL (ref 6.0–8.3)

## 2014-04-17 LAB — BASIC METABOLIC PANEL
BUN: 18 mg/dL (ref 6–23)
CO2: 25 mEq/L (ref 19–32)
Calcium: 9.4 mg/dL (ref 8.4–10.5)
Chloride: 105 mEq/L (ref 96–112)
Creatinine, Ser: 1.1 mg/dL (ref 0.4–1.5)
GFR: 70.42 mL/min (ref >60.00)
Glucose, Bld: 108 mg/dL — ABNORMAL HIGH (ref 70–99)
Potassium: 4.7 mEq/L (ref 3.5–5.1)
Sodium: 135 mEq/L (ref 135–145)

## 2014-04-17 LAB — LIPID PANEL
Cholesterol: 143 mg/dL (ref 0–200)
HDL: 30.9 mg/dL — ABNORMAL LOW (ref >39.00)
LDL Cholesterol: 84 mg/dL (ref 0–99)
NonHDL: 112.1
Total CHOL/HDL Ratio: 5
Triglycerides: 140 mg/dL (ref 0.0–149.0)
VLDL: 28 mg/dL (ref 0.0–40.0)

## 2014-04-17 NOTE — Progress Notes (Signed)
Subjective:    Patient ID: Eric George, male    DOB: 08-27-1930, 80 y.o.   MRN: [id number]  HPI Patient seen for Medicare wellness exam and medical follow-up. He has chronic problems including history of CVA, aphasia, macular degeneration, recurrent small bowel obstruction, hypertension, hyperlipidemia. He's had some cognitive impairment and memory issues and recently seen by neurology. Blood screening including TSH, B12, RPR unremarkable. MRI of brain apparently no acute findings. His Zoloft was increased to 100 mg. Wife thinks he might be slightly more engaged. He apparently scored 25 out of 30 on mental status exam last year and 21 out of 97 recently. Wife is not interested in medication such as Aricept at this time. She thinks he is stable regarding cognitive functioning.  Blood pressure well controlled. Compliant with medications. Immunizations reviewed. They declines shingles vaccine. No history of Prevnar.  Past Medical History  Diagnosis Date  . Bowel obstruction   . Hydrocephalus   . TBI (traumatic brain injury)   . Colloid cyst of brain   . GERD (gastroesophageal reflux disease)   . Hypertension   . 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 Hyperlipidemia   . Stroke    Past Surgical History  Procedure Laterality Date  . Hernia repair    . Prostatectomy    . Brain surgery    . Tee without cardioversion  03/22/2012    Procedure: TRANSESOPHAGEAL ECHOCARDIOGRAM (TEE);  Surgeon: [name], MD;  Location: [facility] ENDOSCOPY;  Service: Cardiovascular;  Laterality: N/A;    reports that he quit smoking about 47 years ago. His smoking use included Cigarettes. He has a 7.5 pack-year smoking history. He has never used smokeless tobacco. He reports that he does not drink alcohol or use illicit drugs. family history includes Heart disease in his sister. Allergies  Allergen Reactions  . Penicillins Rash   1.  Risk factors based on Past Medical , Social, and Family history  reviewed and as indicated above with no changes 2.  Limitations in physical activities None.  No recent falls. Sedentary. 3.  Depression/mood No active depression or anxiety issues. Past hx of depression stable on Sertraline 100 mg daily. 4.  Hearing chronic progressive bilateral hearing loss. He has hearing aids bilaterally 5.  ADLs independent in all. 6.  Cognitive function (orientation to time and place, language, writing, speech,memory) short-term memory deficits in recent mental status exam 21 out of 30.  Language and judgement intact. 7.  Home Safety no issues 8.  Height, weight, and visual acuity.all stable. 9.  Counseling discussed fall prevention 10. Recommendation of preventive services. Prevnar 13. We discussed shingles vaccine and they decline 11. Labs based on risk factors lipid, hepatic, basic metabolic panel 12. Care Plan as above.  58. Other Providers Dr. Leonie Man neurology  Dr Collene Mares GI 64. Written schedule of screening/prevention services given to patient.    Review of Systems  Constitutional: Negative for fever, activity change, appetite change and fatigue.  HENT: Negative for congestion, ear pain and trouble swallowing.   Eyes: Negative for pain and visual disturbance.  Respiratory: Negative for cough, shortness of breath and wheezing.   Cardiovascular: Negative for chest pain and palpitations.  Gastrointestinal: Negative for nausea, vomiting, abdominal pain, diarrhea, constipation, blood in stool, abdominal distention and rectal pain.  Genitourinary: Negative for dysuria, hematuria and testicular pain.  Musculoskeletal: Negative for joint swelling and arthralgias.  Skin: Negative for rash.  Neurological: Negative for dizziness, syncope and headaches.  Hematological: Negative for adenopathy.  Psychiatric/Behavioral: Negative for confusion and dysphoric mood.       Objective:   Physical Exam  Constitutional: He is oriented to person, place, and time. He appears  well-developed and well-nourished. No distress.  HENT:  Head: Normocephalic and atraumatic.  Right Ear: External ear normal.  Left Ear: External ear normal.  Mouth/Throat: Oropharynx is clear and moist.  Eyes: Conjunctivae and EOM are normal. Pupils are equal, round, and reactive to light.  Neck: Normal range of motion. Neck supple. No thyromegaly present.  Cardiovascular: Normal rate, regular rhythm and normal heart sounds.   No murmur heard. Pulmonary/Chest: No respiratory distress. He has no wheezes. He has no rales.  Abdominal: Soft. Bowel sounds are normal. He exhibits no distension and no mass. There is no tenderness. There is no rebound and no guarding.  Musculoskeletal: He exhibits no edema.  Lymphadenopathy:    He has no cervical adenopathy.  Neurological: He is alert and oriented to person, place, and time. He displays normal reflexes. No cranial nerve deficit.  Skin: No rash noted.  Psychiatric: He has a normal mood and affect.          Assessment & Plan:  #1 Medicare wellness exam. Prevnar 13 given. Flu vaccine already given. Discussed shingles vaccine and they decline. No further PSA testing given his age  #2 hypertension adequate control. Continue current medication #3 dyslipidemia. Continue Lipitor.  Check lipid and hepatic. #4 depression stable currently sertraline 100 mg daily #5 cognitive impairment.  Wife feels this is stable, though he  Apparently has had decline in MMSE over past year.  Recent work up unremarkable.  They are not interested in medications at this time and will defer further discussions to neurology.

## 2014-04-17 NOTE — Patient Instructions (Signed)
Remember yearly flu vaccine We are giving Prevnar 13 vaccine today

## 2014-04-17 NOTE — Progress Notes (Signed)
Pre visit review using our clinic review tool, if applicable. No additional management support is needed unless otherwise documented below in the visit note. 

## 2014-04-18 DIAGNOSIS — H40019 Open angle with borderline findings, low risk, unspecified eye: Secondary | ICD-10-CM | POA: Diagnosis not present

## 2014-05-06 ENCOUNTER — Other Ambulatory Visit: Payer: Self-pay | Admitting: Family Medicine

## 2014-05-22 ENCOUNTER — Ambulatory Visit: Payer: BLUE CROSS/BLUE SHIELD | Admitting: Neurology

## 2014-05-22 ENCOUNTER — Other Ambulatory Visit: Payer: Self-pay | Admitting: Family Medicine

## 2014-08-15 DIAGNOSIS — B369 Superficial mycosis, unspecified: Secondary | ICD-10-CM | POA: Diagnosis not present

## 2014-08-15 DIAGNOSIS — H6242 Otitis externa in other diseases classified elsewhere, left ear: Secondary | ICD-10-CM | POA: Diagnosis not present

## 2014-08-15 DIAGNOSIS — H9110 Presbycusis, unspecified ear: Secondary | ICD-10-CM | POA: Diagnosis not present

## 2014-08-16 DIAGNOSIS — K5900 Constipation, unspecified: Secondary | ICD-10-CM | POA: Diagnosis not present

## 2014-08-16 DIAGNOSIS — Z86010 Personal history of colonic polyps: Secondary | ICD-10-CM | POA: Diagnosis not present

## 2014-08-16 DIAGNOSIS — K219 Gastro-esophageal reflux disease without esophagitis: Secondary | ICD-10-CM | POA: Diagnosis not present

## 2014-08-16 DIAGNOSIS — K5730 Diverticulosis of large intestine without perforation or abscess without bleeding: Secondary | ICD-10-CM | POA: Diagnosis not present

## 2014-09-24 ENCOUNTER — Telehealth: Payer: Self-pay | Admitting: Family Medicine

## 2014-09-24 NOTE — Telephone Encounter (Signed)
Yes.  Refill for 6 months. 

## 2014-09-24 NOTE — Telephone Encounter (Signed)
Patient's wife is requesting re-fill on sertraline (ZOLOFT) 100 MG tablet.  She states Dr. Leonie Man originally prescribed the RX and wants to know if Dr. Elease Hashimoto will fill it.   [place], [place]

## 2014-09-25 DIAGNOSIS — H6240 Otitis externa in other diseases classified elsewhere, unspecified ear: Secondary | ICD-10-CM | POA: Diagnosis not present

## 2014-09-25 DIAGNOSIS — B369 Superficial mycosis, unspecified: Secondary | ICD-10-CM | POA: Diagnosis not present

## 2014-09-25 DIAGNOSIS — H9110 Presbycusis, unspecified ear: Secondary | ICD-10-CM | POA: Diagnosis not present

## 2014-09-25 DIAGNOSIS — H6242 Otitis externa in other diseases classified elsewhere, left ear: Secondary | ICD-10-CM | POA: Diagnosis not present

## 2014-09-25 MED ORDER — SERTRALINE HCL 100 MG PO TABS: 100.0000 mg | ORAL_TABLET | Freq: Every day | ORAL | Status: DC

## 2014-09-25 NOTE — Telephone Encounter (Signed)
Rx sent to mail order

## 2014-10-17 DIAGNOSIS — H40013 Open angle with borderline findings, low risk, bilateral: Secondary | ICD-10-CM | POA: Diagnosis not present

## 2014-11-03 ENCOUNTER — Other Ambulatory Visit: Payer: Self-pay | Admitting: Family Medicine

## 2015-02-01 ENCOUNTER — Ambulatory Visit (INDEPENDENT_AMBULATORY_CARE_PROVIDER_SITE_OTHER): Payer: Medicare Other | Admitting: Family Medicine

## 2015-02-01 ENCOUNTER — Encounter: Payer: Self-pay | Admitting: Family Medicine

## 2015-02-01 VITALS — BP 134/78 | Temp 98.0°F | Ht 75.0 in | Wt 182.0 lb

## 2015-02-01 DIAGNOSIS — S61219A Laceration without foreign body of unspecified finger without damage to nail, initial encounter: Secondary | ICD-10-CM | POA: Diagnosis not present

## 2015-02-01 NOTE — Progress Notes (Signed)
   Subjective:    Patient ID: Eric George, male    DOB: 12/16/1930, 81 y.o.   MRN: [id number]  HPI Here for a dog bite injury to the left index finger. This occurred about 5 pm yesterday evening. He was putting the leash on his dog to prepare for a walk when the leash seemed to pinch the dog's skin. He then bit the patient. The dog's shots are up to date and his behavior has been normal otherwise. They cleaned the wound with hydrogen peroxide and dressed it with Neosporin and gauze. It is not painful.    Review of Systems  Constitutional: Negative.   Skin: Positive for wound.       Objective:   Physical Exam  Constitutional: He appears well-developed and well-nourished. No distress.  Skin:  The flexor surface of the left index finger has a large superficial skin flap in place. No active bleeding. The finger has full ROM          Assessment & Plan:  The finger has a superficial laceration with a skin flap in place. This will be dressed with Telfa, Neosporin, and gauze. They will change the dressing daily. Cover infection with Keflex.

## 2015-02-01 NOTE — Progress Notes (Signed)
Pre visit review using our clinic review tool, if applicable. No additional management support is needed unless otherwise documented below in the visit note. 

## 2015-02-02 ENCOUNTER — Telehealth: Payer: Self-pay | Admitting: Family Medicine

## 2015-02-04 NOTE — Telephone Encounter (Signed)
I apologize that the Keflex rx was not sent in. I agree with them that he may not need it now if the finger looks good. They can watch it and let us know if it gets red, drains, etc.

## 2015-02-04 NOTE — Telephone Encounter (Signed)
I spoke with pt's wife and went over below information. Pt will call back if he develops any redness or finger starts to drain.

## 2015-02-04 NOTE — Telephone Encounter (Signed)
Pt saw Dr Sarajane Jews for this issue of dog bite. Wife thought keflex was to be called in. But was not at pharmacy.  Now wife would like to know if patient even needs the abx at this point. No sign of infection, but she does not know what pt should do. [facility] [place]

## 2015-02-04 NOTE — Telephone Encounter (Signed)
[facility] Night - Client TELEPHONE [facility] Patient Name: Eric George Gender: Male DOB: Jan 15, 1931 Age: 81 Y 2 M 27 D Return Phone Number: [phone] (Primary), [phone] (Secondary) Address[place] [address] Client [facility] Night - Client Client Site [facility] - Night Physician [place], [place] Type Call Call Type Triage / Clinical Relationship To Patient Spouse Return Phone Number [phone] (Primary) Chief Complaint Prescription Refill or Medication Request (non symptomatic) Initial Comment Caller states her husbands RX was not called into the pharmacy. PreDisposition Home Care Nurse Assessment Nurse: Harlow Mares, RN, Suanne Marker Date/Time (Eastern Time): 02/02/2015 9:31:58 AM Please select the assessment type ---RX called in but not at pharm Additional Documentation ---Caller states her husbands RX was not called into the pharmacy. Reports that patient was bitten by a dog on the finger and Dr. Sarajane Jews reported that he would call in Keflex as a preventative and was told that he would call this into the pharmacy. It has not been received. Document the name of the medication. ---Keflex Pharmacy name and phone number. ---[facility] [phone] Has the office closed within the last 30 minutes? ---No Does the client directives allow for assistance with medications after hours? ---No Additional Documentation ---Agreed to triage. Advised that nurse will send this request to the MD office and they will get this on Monday. Nurse: Harlow Mares, RN, Suanne Marker Date/Time (Eastern Time): 02/02/2015 9:34:48 AM Confirm and document reason for call. If symptomatic, describe symptoms. ---Caller reports that patient was bitten on the finger by a dog and he saw the MD yesterday. Has the patient traveled out of the country within the last 30 days? ---Not Applicable Does the  patient require triage? ---Yes Related visit to physician within the last 2 weeks? ---Yes Does the PT have any chronic conditions? (i.e. diabetes, asthma, etc.) ---Yes List chronic conditions. ---reflux, heartburn, hypertension, hx stroke (takes ASA) PLEASE NOTE: All timestamps contained within this report are represented as [place] Standard Time. CONFIDENTIALTY NOTICE: This fax transmission is intended only for the addressee. It contains information that is legally privileged, confidential or otherwise protected from use or disclosure. If you are not the intended recipient, you are strictly prohibited from reviewing, disclosing, copying using or disseminating any of this information or taking any action in reliance on or regarding this information. If you have received this fax in error, please notify us immediately by telephone so that we can arrange for its return to [place]. Phone: [phone], Toll-Free: [phone], Fax: [phone] Page: 2 of 2 Call Id: [id number] Guidelines Guideline Title Affirmed Question Affirmed Notes Nurse Date/Time Eilene Ghazi Time) Animal Bite Cut that's too small to irrigate (<1/8 inch or 3 mm) (all triage questions negative) Harlow Mares, RN, Suanne Marker 02/02/2015 9:36:45 AM Disp. Time Eilene Ghazi Time) Disposition Final User 02/02/2015 [place], RN, Rosalyn Charters Understands: Yes Disagree/Comply: Comply Care Advice Given Per Guideline HOME CARE: You should be able to treat this at home. REASSURANCE: * The cut sounds too small to need stitches or medical irrigation. * Clean it well to decrease the risk of infection. APPLY ANTIBIOTIC OINTMENT: Apply an antibiotic ointment (OTC) to the wound three times a day for 3 days. EXPECTED COURSE: Most scratches, scrapes and other minor bites heal within 5-7 days. CALL BACK IF: * Fever occurs * Wound begins to look infected (pus, redness, red streaks) * You become worse. CARE ADVICE given per Animal Bite (Adult)  guideline. After Care

## 2015-02-08 ENCOUNTER — Ambulatory Visit (INDEPENDENT_AMBULATORY_CARE_PROVIDER_SITE_OTHER): Payer: Medicare Other | Admitting: Family Medicine

## 2015-02-08 ENCOUNTER — Telehealth: Payer: Self-pay | Admitting: Family Medicine

## 2015-02-08 DIAGNOSIS — Z23 Encounter for immunization: Secondary | ICD-10-CM | POA: Diagnosis not present

## 2015-02-08 MED ORDER — CEPHALEXIN 500 MG PO CAPS: 500.0000 mg | ORAL_CAPSULE | Freq: Four times a day (QID) | ORAL | Status: DC

## 2015-02-08 NOTE — Telephone Encounter (Signed)
Eric George from [place] calling to inquire about rx they received today for cephALEXin (KEFLEX) 500 MG capsule.  Wife states patient is allergic to penicillin and amoxicillin and wants to know if ok for patient to take this medication.  Please follow-up with pharmacy.

## 2015-02-08 NOTE — Telephone Encounter (Signed)
Yes it is okay to take the Keflex

## 2015-02-08 NOTE — Telephone Encounter (Signed)
I left a voice message with below information. 

## 2015-02-08 NOTE — Addendum Note (Signed)
Addended by: Colleen Can on: 02/08/2015 12:17 PM   Modules accepted: Orders

## 2015-02-08 NOTE — Telephone Encounter (Signed)
Pt came into the office today for a flu shot and states that his finger is now red, swelling and has pus in it.  Dr. Sarajane Jews advised to call in Keflex 500 qid x 7 days.  Rx sent to pharmacy and pt is aware.

## 2015-02-08 NOTE — Telephone Encounter (Signed)
This was prescribed by Dr Sarajane Jews.

## 2015-02-08 NOTE — Telephone Encounter (Signed)
Rx was sent to the pharmacy by Dr Sarajane Jews

## 2015-02-27 DIAGNOSIS — H903 Sensorineural hearing loss, bilateral: Secondary | ICD-10-CM | POA: Diagnosis not present

## 2015-03-24 ENCOUNTER — Other Ambulatory Visit: Payer: Self-pay | Admitting: Family Medicine

## 2015-04-19 ENCOUNTER — Ambulatory Visit (INDEPENDENT_AMBULATORY_CARE_PROVIDER_SITE_OTHER): Payer: Medicare Other | Admitting: Family Medicine

## 2015-04-19 ENCOUNTER — Encounter: Payer: Self-pay | Admitting: Family Medicine

## 2015-04-19 VITALS — BP 140/70 | HR 54 | Temp 97.3°F | Resp 16 | Ht 72.5 in | Wt 179.8 lb

## 2015-04-19 DIAGNOSIS — K219 Gastro-esophageal reflux disease without esophagitis: Secondary | ICD-10-CM

## 2015-04-19 DIAGNOSIS — R634 Abnormal weight loss: Secondary | ICD-10-CM

## 2015-04-19 DIAGNOSIS — Z0000 Encounter for general adult medical examination without abnormal findings: Secondary | ICD-10-CM | POA: Diagnosis not present

## 2015-04-19 DIAGNOSIS — E785 Hyperlipidemia, unspecified: Secondary | ICD-10-CM | POA: Diagnosis not present

## 2015-04-19 DIAGNOSIS — M25561 Pain in right knee: Secondary | ICD-10-CM

## 2015-04-19 DIAGNOSIS — I10 Essential (primary) hypertension: Secondary | ICD-10-CM

## 2015-04-19 DIAGNOSIS — M25562 Pain in left knee: Secondary | ICD-10-CM

## 2015-04-19 LAB — BASIC METABOLIC PANEL
BUN: 20 mg/dL (ref 6–23)
CO2: 29 mEq/L (ref 19–32)
Calcium: 9.8 mg/dL (ref 8.4–10.5)
Chloride: 105 mEq/L (ref 96–112)
Creatinine, Ser: 1 mg/dL (ref 0.40–1.50)
GFR: 75.95 mL/min (ref >60.00)
Glucose, Bld: 95 mg/dL (ref 70–99)
Potassium: 5 mEq/L (ref 3.5–5.1)
Sodium: 143 mEq/L (ref 135–145)

## 2015-04-19 LAB — LIPID PANEL
Cholesterol: 152 mg/dL (ref 0–200)
HDL: 37.4 mg/dL — ABNORMAL LOW (ref >39.00)
LDL Cholesterol: 91 mg/dL (ref 0–99)
NonHDL: 114.19
Total CHOL/HDL Ratio: 4
Triglycerides: 116 mg/dL (ref 0.0–149.0)
VLDL: 23.2 mg/dL (ref 0.0–40.0)

## 2015-04-19 LAB — CBC WITH DIFFERENTIAL/PLATELET
Basophils Absolute: 0 10*3/uL (ref 0.0–0.1)
Basophils Relative: 0.2 % (ref 0.0–3.0)
Eosinophils Absolute: 0.2 10*3/uL (ref 0.0–0.7)
Eosinophils Relative: 2.1 % (ref 0.0–5.0)
HCT: 48.1 % (ref 39.0–52.0)
Hemoglobin: 16 g/dL (ref 13.0–17.0)
Lymphocytes Relative: 11.9 % — ABNORMAL LOW (ref 12.0–46.0)
Lymphs Abs: 1.2 10*3/uL (ref 0.7–4.0)
MCHC: 33.3 g/dL (ref 30.0–36.0)
MCV: 86.2 fl (ref 78.0–100.0)
Monocytes Absolute: 0.6 10*3/uL (ref 0.1–1.0)
Monocytes Relative: 5.5 % (ref 3.0–12.0)
Neutro Abs: 8.2 10*3/uL — ABNORMAL HIGH (ref 1.4–7.7)
Neutrophils Relative %: 80.3 % — ABNORMAL HIGH (ref 43.0–77.0)
Platelets: 241 10*3/uL (ref 150.0–400.0)
RBC: 5.58 Mil/uL (ref 4.22–5.81)
RDW: 14.1 % (ref 11.5–15.5)
WBC: 10.3 10*3/uL (ref 4.0–10.5)

## 2015-04-19 LAB — HEPATIC FUNCTION PANEL
ALT: 23 U/L (ref 0–53)
AST: 19 U/L (ref 0–37)
Albumin: 4.3 g/dL (ref 3.5–5.2)
Alkaline Phosphatase: 88 U/L (ref 39–117)
Bilirubin, Direct: 0.1 mg/dL (ref 0.0–0.3)
Total Bilirubin: 0.6 mg/dL (ref 0.2–1.2)
Total Protein: 6.7 g/dL (ref 6.0–8.3)

## 2015-04-19 LAB — TSH: TSH: 3.43 u[IU]/mL (ref 0.35–4.50)

## 2015-04-19 NOTE — Progress Notes (Signed)
Subjective:    Patient ID: Eric George, male    DOB: 20-Jan-1931, 81 y.o.   MRN: [id number]  HPI Patient here for Medicare wellness exam and medical follow-up He has history of chronic hearing loss, macular degeneration, hyperlipidemia, GERD, hypertension, recurrent small bowel obstruction, remote history of prostate cancer and past history of stroke.  His wife states that his hearing has progressively declined and they see audiologist every few months. He has bilateral hearing aids.  recent issues with progressive bilateral knee pain and stiffness.   no effusion. No injury. Wife thinks his gait has become a little bit more unstable. No recent falls. Ambulates with cane.  Takes Avapro for hypertension. Not monitoring blood pressures regularly. No dizziness. Takes Lipitor for hyperlipidemia. No myalgias. No history of CAD.  Has lost about 7 pounds of weight since last visit though his wife states his appetite is good. He is somewhat less active overall.denies any headache, abdominal pain, diarrhea, nausea or vomiting, or fevers or chills  Past Medical History  Diagnosis Date  . Bowel obstruction ([place])   . Hydrocephalus   . TBI (traumatic brain injury) ([place])   . Colloid cyst of brain ([place])   . GERD (gastroesophageal reflux disease)   . Hypertension   . 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 Hyperlipidemia   . Stroke [facility])    Past Surgical History  Procedure Laterality Date  . Hernia repair    . Prostatectomy    . Brain surgery    . Tee without cardioversion  03/22/2012    Procedure: TRANSESOPHAGEAL ECHOCARDIOGRAM (TEE);  Surgeon: [name], MD;  Location: [facility] ENDOSCOPY;  Service: Cardiovascular;  Laterality: N/A;    reports that he quit smoking about 48 years ago. His smoking use included Cigarettes. He has a 7.5 pack-year smoking history. He has never used smokeless tobacco. He reports that he does not drink alcohol or use illicit drugs. family history includes  Heart disease in his sister. Allergies  Allergen Reactions  . Penicillins Rash   1.  Risk factors based on Past Medical , Social, and Family history reviewed and as indicated above with no changes 2.  Limitations in physical activities None.  No recent falls. 3.  Depression/mood No active depression or anxiety issues 4.  Hearing chronic hearing loss with bilateral hearing aids 5.  ADLs independent in all. 6.  Cognitive function (orientation to time and place, language, writing, speech,memory) hearing is impaired which makes communication somewhat difficult. He has some impairment with short-term memory. Has seen neurologist previously. 7.  Home Safety no issues 8.  Height, weight, and visual acuity.weight down about 8 pounds from last year- as above. 9.  Counseling discussed the importance of increased physical activity and fall risk prevention 10. Recommendation of preventive services. Flu vaccine already given. Declines shingles vaccine. Other immunizations up-to-date 11. Labs based on risk factors CBC, basic metabolic panel, lipid, hepatic, TSH 12. Care Plan as above 13. Other Providers-none  14. Written schedule of screening/prevention services given to patient.    Review of Systems  Constitutional: Positive for fatigue and unexpected weight change. Negative for fever, activity change and appetite change.  HENT: Negative for congestion, ear pain and trouble swallowing.   Eyes: Negative for pain and visual disturbance.  Respiratory: Negative for cough, shortness of breath and wheezing.   Cardiovascular: Negative for chest pain and palpitations.  Gastrointestinal: Negative for nausea, vomiting, abdominal pain, diarrhea, constipation, blood in stool, abdominal distention and rectal pain.  Endocrine: Negative for polydipsia and polyuria.  Genitourinary: Negative for dysuria, hematuria and testicular pain.  Musculoskeletal: Positive for arthralgias. Negative for joint swelling.  Skin:  Negative for rash.  Neurological: Negative for dizziness, syncope and headaches.  Hematological: Negative for adenopathy.  Psychiatric/Behavioral: Negative for confusion and dysphoric mood.       Objective:   Physical Exam  Constitutional: He is oriented to person, place, and time. He appears well-developed and well-nourished. No distress.  HENT:  Head: Normocephalic and atraumatic.  Right Ear: External ear normal.  Left Ear: External ear normal.  Mouth/Throat: Oropharynx is clear and moist.  Eyes: Conjunctivae and EOM are normal. Pupils are equal, round, and reactive to light.  Neck: Normal range of motion. Neck supple. No thyromegaly present.  Cardiovascular: Normal rate, regular rhythm and normal heart sounds.   No murmur heard. Pulmonary/Chest: No respiratory distress. He has no wheezes. He has no rales.  Abdominal: Soft. Bowel sounds are normal. He exhibits no distension and no mass. There is no tenderness. There is no rebound and no guarding.  Musculoskeletal: He exhibits no edema.  Both knees revealed mild crepitus. No effusion. No warmth. No erythema. No localized tenderness.  Lymphadenopathy:    He has no cervical adenopathy.  Neurological: He is alert and oriented to person, place, and time. He displays normal reflexes. No cranial nerve deficit.  Skin: No rash noted.  Psychiatric: He has a normal mood and affect.          Assessment & Plan:   #1 Medicare wellness visit. Discussed shingles vaccine and he declines. Flu vaccine already given. Other immunizations up-to-date. #2 increased risk of falls. Discussed physical therapy and at this point they are not interested. #3 hypertension stable and at goal. Continue Avapro #4 history of depression. Currently stable on sertraline #5 dyslipidemia. Recheck lipid and hepatic panel. Continue Lipitor #6 Bilateral knee pain. Suspect osteoarthritis. We discussed getting some baseline x-rays and at this point they wish to wait #7  weight loss. Modest weight loss of 7 to 8 pounds since last year.Encouraging is that he has good appetite. Check labs as above. Discussed good healthy supplements for calories. Recheck weight in 3 months

## 2015-04-19 NOTE — Progress Notes (Signed)
Pre visit review using our clinic review tool, if applicable. No additional management support is needed unless otherwise documented below in the visit note. 

## 2015-04-19 NOTE — Patient Instructions (Signed)
Health Maintenance  Topic Date Due  . ZOSTAVAX  04/18/2016 (Originally 11/04/1992)  . INFLUENZA VACCINE  12/03/2015  . TETANUS/TDAP  04/01/2020  . PNA vac Low Risk Adult  Completed    Let me know if interested in physical therapy.   Confirm dose of Lipitor and let me know if not 40 mg We will call you with labs done today.

## 2015-04-24 ENCOUNTER — Encounter: Payer: Self-pay | Admitting: Family Medicine

## 2015-04-24 NOTE — Telephone Encounter (Signed)
Please review labs and message. Thanks. Pt was seen 12/16 for CPE-fasting.

## 2015-04-28 ENCOUNTER — Inpatient Hospital Stay (HOSPITAL_COMMUNITY)
Admission: EM | Admit: 2015-04-28 | Discharge: 2015-05-04 | DRG: 871 | Disposition: A | Discharge status: Home / Self Care | Payer: Medicare Other | Attending: Internal Medicine | Admitting: Internal Medicine

## 2015-04-28 DIAGNOSIS — K567 Ileus, unspecified: Secondary | ICD-10-CM | POA: Diagnosis not present

## 2015-04-28 DIAGNOSIS — J9601 Acute respiratory failure with hypoxia: Secondary | ICD-10-CM | POA: Diagnosis not present

## 2015-04-28 DIAGNOSIS — R112 Nausea with vomiting, unspecified: Secondary | ICD-10-CM | POA: Diagnosis present

## 2015-04-28 DIAGNOSIS — I493 Ventricular premature depolarization: Secondary | ICD-10-CM | POA: Diagnosis present

## 2015-04-28 DIAGNOSIS — J189 Pneumonia, unspecified organism: Secondary | ICD-10-CM | POA: Diagnosis not present

## 2015-04-28 DIAGNOSIS — Z79899 Other long term (current) drug therapy: Secondary | ICD-10-CM

## 2015-04-28 DIAGNOSIS — Z8673 Personal history of transient ischemic attack (TIA), and cerebral infarction without residual deficits: Secondary | ICD-10-CM

## 2015-04-28 DIAGNOSIS — R0602 Shortness of breath: Secondary | ICD-10-CM

## 2015-04-28 DIAGNOSIS — E785 Hyperlipidemia, unspecified: Secondary | ICD-10-CM | POA: Diagnosis present

## 2015-04-28 DIAGNOSIS — Z87891 Personal history of nicotine dependence: Secondary | ICD-10-CM

## 2015-04-28 DIAGNOSIS — Z87820 Personal history of traumatic brain injury: Secondary | ICD-10-CM | POA: Diagnosis not present

## 2015-04-28 DIAGNOSIS — A419 Sepsis, unspecified organism: Secondary | ICD-10-CM | POA: Diagnosis not present

## 2015-04-28 DIAGNOSIS — R1111 Vomiting without nausea: Secondary | ICD-10-CM | POA: Diagnosis not present

## 2015-04-28 DIAGNOSIS — G919 Hydrocephalus, unspecified: Secondary | ICD-10-CM | POA: Diagnosis not present

## 2015-04-28 DIAGNOSIS — R1032 Left lower quadrant pain: Secondary | ICD-10-CM | POA: Diagnosis not present

## 2015-04-28 DIAGNOSIS — J690 Pneumonitis due to inhalation of food and vomit: Secondary | ICD-10-CM | POA: Diagnosis not present

## 2015-04-28 DIAGNOSIS — F329 Major depressive disorder, single episode, unspecified: Secondary | ICD-10-CM | POA: Diagnosis present

## 2015-04-28 DIAGNOSIS — K2970 Gastritis, unspecified, without bleeding: Secondary | ICD-10-CM | POA: Diagnosis not present

## 2015-04-28 DIAGNOSIS — K219 Gastro-esophageal reflux disease without esophagitis: Secondary | ICD-10-CM | POA: Diagnosis present

## 2015-04-28 DIAGNOSIS — J9600 Acute respiratory failure, unspecified whether with hypoxia or hypercapnia: Secondary | ICD-10-CM

## 2015-04-28 DIAGNOSIS — Z7982 Long term (current) use of aspirin: Secondary | ICD-10-CM

## 2015-04-28 DIAGNOSIS — R413 Other amnesia: Secondary | ICD-10-CM | POA: Diagnosis present

## 2015-04-28 DIAGNOSIS — R1084 Generalized abdominal pain: Secondary | ICD-10-CM

## 2015-04-28 DIAGNOSIS — I10 Essential (primary) hypertension: Secondary | ICD-10-CM | POA: Diagnosis present

## 2015-04-28 DIAGNOSIS — Z8719 Personal history of other diseases of the digestive system: Secondary | ICD-10-CM

## 2015-04-28 HISTORY — DX: Pneumonia, unspecified organism: J18.9

## 2015-04-28 HISTORY — DX: Pneumonitis due to inhalation of food and vomit: J69.0

## 2015-04-28 HISTORY — DX: Other amnesia: R41.3

## 2015-04-28 LAB — CBC WITH DIFFERENTIAL/PLATELET
Basophils Absolute: 0 10*3/uL (ref 0.0–0.1)
Basophils Relative: 0 %
Eosinophils Absolute: 0.1 10*3/uL (ref 0.0–0.7)
Eosinophils Relative: 0 %
HCT: 48.7 % (ref 39.0–52.0)
Hemoglobin: 16.3 g/dL (ref 13.0–17.0)
Lymphocytes Relative: 4 %
Lymphs Abs: 0.8 10*3/uL (ref 0.7–4.0)
MCH: 28.9 pg (ref 26.0–34.0)
MCHC: 33.5 g/dL (ref 30.0–36.0)
MCV: 86.3 fL (ref 78.0–100.0)
Monocytes Absolute: 0.6 10*3/uL (ref 0.1–1.0)
Monocytes Relative: 3 %
Neutro Abs: 16.2 10*3/uL — ABNORMAL HIGH (ref 1.7–7.7)
Neutrophils Relative %: 93 %
Platelets: 211 10*3/uL (ref 150–400)
RBC: 5.64 MIL/uL (ref 4.22–5.81)
RDW: 13.2 % (ref 11.5–15.5)
WBC: 17.7 10*3/uL — ABNORMAL HIGH (ref 4.0–10.5)

## 2015-04-28 MED ORDER — SODIUM CHLORIDE 0.9 % IV BOLUS (SEPSIS)
1000.0000 mL | Freq: Once | INTRAVENOUS | Status: AC
  Administered 2015-04-28: 1000 mL via INTRAVENOUS

## 2015-04-28 MED ORDER — ONDANSETRON HCL 4 MG/2ML IJ SOLN
4.0000 mg | Freq: Once | INTRAMUSCULAR | Status: AC
  Administered 2015-04-28: 4 mg via INTRAVENOUS
  Filled 2015-04-28: qty 2

## 2015-04-28 NOTE — ED Notes (Signed)
Per EMS, pt ate a sandwich around 1800 then began having abd pain with n/v. Vomited 3 times and felt relief after 3rd vomiting episode but felt weak after. Pt helped back to bed by family and stated that he was not as steady as normal. Pt has hx of a-fib but under control, pt is in a-fib now HR 70. Pt has slurred speech at baseline due to past brain injury and family states that he is at baseline. Pt just complains of LLQ pain. Pt is no longer nausous. VSS.

## 2015-04-28 NOTE — ED Provider Notes (Addendum)
CSN: [id number]     Arrival date & time 04/28/15  2237 History  By signing my name below, I, Altamease Oiler, attest that this documentation has been prepared under the direction and in the presence of Everlene Balls, MD. Electronically Signed: Altamease Oiler, ED Scribe. 04/28/2015. 11:15 PM    Chief Complaint  Patient presents with  . Abdominal Pain  . Emesis   Level V caveat secondary to the patient's history of TBI and stroke   The history is provided by the EMS personnel and the spouse. No language interpreter was used.  Brought in by EMS, Eric George is a 81 y.o. male with history of small bowel obstruction X3, GERD, stroke, TBI who presents to the Emergency Department with his wife complaining of lower abdominal pain with onset this evening around 7 PM.  Associated symptoms include abdominal distension and 3 episodes of emesis. His last bowel movement was yesterday. The patient's wife states that he has chronic speech difficulty secondary to a stroke and TBI and that he is speaking at baseline presently. She states that he has no known history of a-fib.    Past Medical History  Diagnosis Date  . Bowel obstruction ([place])   . Hydrocephalus   . TBI (traumatic brain injury) ([place])   . 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 GERD (gastroesophageal reflux disease)   . Hypertension   . 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 Hyperlipidemia   . Stroke [facility])    Past Surgical History  Procedure Laterality Date  . Hernia repair    . Prostatectomy    . Brain surgery    . Tee without cardioversion  03/22/2012    Procedure: TRANSESOPHAGEAL ECHOCARDIOGRAM (TEE);  Surgeon: [name], MD;  Location: [facility] ENDOSCOPY;  Service: Cardiovascular;  Laterality: N/A;   Family History  Problem Relation Age of Onset  . Heart disease Sister    Social History  Substance Use Topics  . Smoking status: Former Smoker -- 0.50 packs/day for 15 years    Types: Cigarettes    Quit date: 09/30/1966   . Smokeless tobacco: Never Used  . Alcohol Use: No    Review of Systems  Unable to perform ROS: Other   Allergies  Penicillins  Home Medications   Prior to Admission medications   Medication Sig Start Date End Date Taking? Authorizing Provider  acetic acid-hydrocortisone (VOSOL-HC) otic solution  02/06/14   Historical Provider, MD  aspirin 325 MG tablet Take 1 tablet (325 mg total) by mouth daily. 03/23/12   Eugenie Filler, MD  atorvastatin (LIPITOR) 40 MG tablet Take 40 mg by mouth daily.    Historical Provider, MD  fexofenadine (ALLEGRA) 60 MG tablet Take 60 mg by mouth daily as needed for allergies or rhinitis.    Historical Provider, MD  irbesartan (AVAPRO) 150 MG tablet TAKE 1 TABLET DAILY 11/06/14   Eulas Post, MD  Multiple Vitamins-Minerals (MACULAR VITAMIN BENEFIT) TABS Take 1 capsule by mouth daily.    Historical Provider, MD  omeprazole-sodium bicarbonate (ZEGERID) 40-1100 MG per capsule Take 1 capsule by mouth 2 (two) times daily.      Historical Provider, MD  Probiotic Product (ALIGN) 4 MG CAPS Take 1 capsule by mouth daily.    Historical Provider, MD  sertraline (ZOLOFT) 100 MG tablet TAKE 1 TABLET DAILY 03/25/15   Eulas Post, MD   BP 140/73 mmHg  Pulse 74  Temp(Src) 97.9 F (36.6 C) (Axillary)  Resp 15  Ht  6' (1.829 m)  Wt 179 lb (81.194 kg)  BMI 24.27 kg/m2  SpO2 98% Physical Exam  Constitutional: He is oriented to person, place, and time. Vital signs are normal. He appears well-developed and well-nourished.  Non-toxic appearance. He does not appear ill. No distress.  HENT:  Head: Normocephalic and atraumatic.  Nose: Nose normal.  Mouth/Throat: Oropharynx is clear and moist. No oropharyngeal exudate.  Eyes: Conjunctivae and EOM are normal. Pupils are equal, round, and reactive to light. No scleral icterus.  Neck: Normal range of motion. Neck supple. No tracheal deviation, no edema, no erythema and normal range of motion present. No thyroid mass  and no thyromegaly present.  Cardiovascular: Normal rate, S1 normal, S2 normal, normal heart sounds, intact distal pulses and normal pulses.  A regularly irregular rhythm present. Exam reveals no gallop and no friction rub.   No murmur heard. Pulmonary/Chest: Effort normal and breath sounds normal. No respiratory distress. He has no wheezes. He has no rhonchi. He has no rales.  Abdominal: Soft. Normal appearance and bowel sounds are normal. He exhibits distension. He exhibits no ascites and no mass. There is no hepatosplenomegaly. There is no tenderness. There is no rebound, no guarding and no CVA tenderness.  Musculoskeletal: Normal range of motion. He exhibits no edema or tenderness.  Lymphadenopathy:    He has no cervical adenopathy.  Neurological: He is alert and oriented to person, place, and time. He has normal strength. No cranial nerve deficit or sensory deficit.  Skin: Skin is warm, dry and intact. No petechiae and no rash noted. He is not diaphoretic. No erythema. No pallor.  Psychiatric: He has a normal mood and affect. His behavior is normal. Judgment normal.  Nursing note and vitals reviewed.   ED Course  Procedures (including critical care time) DIAGNOSTIC STUDIES: Oxygen Saturation is 98% on RA,  normal by my interpretation.    COORDINATION OF CARE: 11:06 PM Discussed treatment plan which includes lab work, EKG, CT A/P, Zofran, and IVF with the pt and his wife at bedside and they agreed to plan.  Labs Review Labs Reviewed  CBC WITH DIFFERENTIAL/PLATELET - Abnormal; Notable for the following:    WBC 17.7 (*)    Neutro Abs 16.2 (*)    All other components within normal limits  COMPREHENSIVE METABOLIC PANEL - Abnormal; Notable for the following:    Glucose, Bld 170 (*)    BUN 23 (*)    Calcium 10.4 (*)    GFR calc non Af Amer 56 (*)    All other components within normal limits  URINALYSIS, ROUTINE W REFLEX MICROSCOPIC (NOT AT [facility]) - Abnormal; Notable for the following:     Ketones, ur 15 (*)    All other components within normal limits  I-STAT CG4 LACTIC ACID, ED - Abnormal; Notable for the following:    Lactic Acid, Venous 2.34 (*)    All other components within normal limits  URINE CULTURE  CULTURE, BLOOD (ROUTINE X 2)  CULTURE, BLOOD (ROUTINE X 2)  LIPASE, BLOOD  MAGNESIUM    Imaging Review Ct Abdomen Pelvis W Contrast  04/29/2015  CLINICAL DATA:  Acute onset of left lower quadrant abdominal pain, nausea and vomiting. Initial encounter. EXAM: CT ABDOMEN AND PELVIS WITH CONTRAST TECHNIQUE: Multidetector CT imaging of the abdomen and pelvis was performed using the standard protocol following bolus administration of intravenous contrast. CONTRAST:  69mL OMNIPAQUE IOHEXOL 300 MG/ML  SOLN COMPARISON:  CT of the abdomen and pelvis from 12/22/2013 FINDINGS: Mild  bibasilar atelectasis is noted. A likely 1.0 cm cyst is noted within the hepatic dome. The liver and spleen are otherwise unremarkable. The gallbladder is within normal limits. The pancreas and adrenal glands are unremarkable. The kidneys are unremarkable in appearance. There is no evidence of hydronephrosis. No renal or ureteral stones are seen. No perinephric stranding is appreciated. The small bowel is partially filled with fluid, without evidence of a focal transition point. Trace surrounding free fluid is seen, raising question for a mild infectious or inflammatory process. The stomach is within normal limits. No acute vascular abnormalities are seen. Mild scattered calcification is noted along the abdominal aorta and its branches. The appendix is normal in caliber, without evidence of appendicitis. The colon is largely decompressed. Scattered diverticulosis is noted along the distal descending and proximal sigmoid colon, without evidence of diverticulitis. The bladder is mildly distended and grossly unremarkable. The patient is status post prostatectomy. No inguinal lymphadenopathy is seen. No acute osseous  abnormalities are identified. Facet disease is noted at the lower lumbar spine. IMPRESSION: 1. Small bowel partially filled with fluid, without evidence of a focal transition point. Trace surrounding free fluid noted, raising question for mild infectious or inflammatory process. No evidence of bowel obstruction. 2. Likely small hepatic cyst noted. 3. Mild bibasilar atelectasis noted. 4. Scattered diverticulosis along the distal descending and proximal sigmoid colon, without evidence of diverticulitis. 5. Mild scattered calcification along the abdominal aorta and its branches. Electronically Signed   By: Garald Balding M.D.   On: 04/29/2015 01:28   Dg Chest Port 1 View  04/29/2015  CLINICAL DATA:  Acute onset of hypoxia.  Initial encounter. EXAM: PORTABLE CHEST 1 VIEW COMPARISON:  Chest radiograph performed 03/20/2012 FINDINGS: The lungs are well-aerated see. Patchy left-sided airspace opacification is compatible with pneumonia. There is no evidence of pleural effusion or pneumothorax. The cardiomediastinal silhouette is within normal limits. No acute osseous abnormalities are seen. IMPRESSION: Patchy left-sided pneumonia noted. Electronically Signed   By: Garald Balding M.D.   On: 04/29/2015 05:54   I have personally reviewed and evaluated these images and lab results as part of my medical decision-making.   EKG Interpretation   Date/Time:  Sunday April 28 2015 22:49:02 EST Ventricular Rate:  82 PR Interval:  220 QRS Duration: 109 QT Interval:  401 QTC Calculation: 468 R Axis:   44 Text Interpretation:  Sinus rhythm Multiform ventricular premature  complexes No significant change since last tracing Confirmed by Glynn Octave [phone]) on 04/28/2015 11:15:14 PM      MDM   Final diagnoses:  None   Patient presents to the ED for abdominal pain with h/o of frequent SBO.  Will obtain CT for evaluation.  He was given IVF and zofran.  Triage notes h/o of afib, but wife denies, he does  not take blood thinners.  On the monitor, he has frequent PVCs which are known.   CT scan is negative for SBO, shows possible enteritis.  Patient was given PO challenge in the ED and he tolerated a meal without any vomiting.  Patient however continues to feel bad. His oxygen has dropped as low as 87% on room air. Heart rate is above 100, white count of 17. Will obtain CT scan to evaluate for pneumonia. Patient will require admission for further care.  CXR shows pneumonia.  Ct cancelled.    I personally performed the services described in this documentation, which was scribed in my presence. The recorded information has been reviewed  and is accurate.       Everlene Balls, MD 04/29/15 [phone]

## 2015-04-29 ENCOUNTER — Emergency Department (HOSPITAL_COMMUNITY): Payer: Medicare Other

## 2015-04-29 ENCOUNTER — Encounter (HOSPITAL_COMMUNITY): Payer: Self-pay | Admitting: Radiology

## 2015-04-29 DIAGNOSIS — A419 Sepsis, unspecified organism: Secondary | ICD-10-CM | POA: Diagnosis present

## 2015-04-29 DIAGNOSIS — Z8673 Personal history of transient ischemic attack (TIA), and cerebral infarction without residual deficits: Secondary | ICD-10-CM | POA: Diagnosis not present

## 2015-04-29 DIAGNOSIS — E785 Hyperlipidemia, unspecified: Secondary | ICD-10-CM | POA: Diagnosis present

## 2015-04-29 DIAGNOSIS — I10 Essential (primary) hypertension: Secondary | ICD-10-CM | POA: Diagnosis not present

## 2015-04-29 DIAGNOSIS — J984 Other disorders of lung: Secondary | ICD-10-CM | POA: Diagnosis not present

## 2015-04-29 DIAGNOSIS — J9600 Acute respiratory failure, unspecified whether with hypoxia or hypercapnia: Secondary | ICD-10-CM | POA: Diagnosis not present

## 2015-04-29 DIAGNOSIS — Z87891 Personal history of nicotine dependence: Secondary | ICD-10-CM | POA: Diagnosis not present

## 2015-04-29 DIAGNOSIS — I493 Ventricular premature depolarization: Secondary | ICD-10-CM | POA: Diagnosis present

## 2015-04-29 DIAGNOSIS — Z87820 Personal history of traumatic brain injury: Secondary | ICD-10-CM | POA: Diagnosis not present

## 2015-04-29 DIAGNOSIS — K567 Ileus, unspecified: Secondary | ICD-10-CM | POA: Diagnosis present

## 2015-04-29 DIAGNOSIS — J690 Pneumonitis due to inhalation of food and vomit: Secondary | ICD-10-CM | POA: Diagnosis present

## 2015-04-29 DIAGNOSIS — J189 Pneumonia, unspecified organism: Secondary | ICD-10-CM | POA: Diagnosis not present

## 2015-04-29 DIAGNOSIS — K219 Gastro-esophageal reflux disease without esophagitis: Secondary | ICD-10-CM | POA: Diagnosis not present

## 2015-04-29 DIAGNOSIS — R112 Nausea with vomiting, unspecified: Secondary | ICD-10-CM | POA: Diagnosis not present

## 2015-04-29 DIAGNOSIS — Z79899 Other long term (current) drug therapy: Secondary | ICD-10-CM | POA: Diagnosis not present

## 2015-04-29 DIAGNOSIS — G919 Hydrocephalus, unspecified: Secondary | ICD-10-CM | POA: Diagnosis present

## 2015-04-29 DIAGNOSIS — Z8719 Personal history of other diseases of the digestive system: Secondary | ICD-10-CM | POA: Diagnosis not present

## 2015-04-29 DIAGNOSIS — F329 Major depressive disorder, single episode, unspecified: Secondary | ICD-10-CM | POA: Diagnosis present

## 2015-04-29 DIAGNOSIS — Z7982 Long term (current) use of aspirin: Secondary | ICD-10-CM | POA: Diagnosis not present

## 2015-04-29 DIAGNOSIS — R0602 Shortness of breath: Secondary | ICD-10-CM | POA: Diagnosis not present

## 2015-04-29 DIAGNOSIS — R413 Other amnesia: Secondary | ICD-10-CM | POA: Diagnosis not present

## 2015-04-29 DIAGNOSIS — J9601 Acute respiratory failure with hypoxia: Secondary | ICD-10-CM | POA: Diagnosis not present

## 2015-04-29 DIAGNOSIS — R1032 Left lower quadrant pain: Secondary | ICD-10-CM | POA: Diagnosis not present

## 2015-04-29 DIAGNOSIS — J9811 Atelectasis: Secondary | ICD-10-CM | POA: Diagnosis not present

## 2015-04-29 LAB — MRSA PCR SCREENING: MRSA by PCR: NEGATIVE

## 2015-04-29 LAB — MAGNESIUM: Magnesium: 2 mg/dL (ref 1.7–2.4)

## 2015-04-29 LAB — URINALYSIS, ROUTINE W REFLEX MICROSCOPIC
Bilirubin Urine: NEGATIVE
Glucose, UA: NEGATIVE mg/dL
Hgb urine dipstick: NEGATIVE
Ketones, ur: 15 mg/dL — AB
Leukocytes, UA: NEGATIVE
Nitrite: NEGATIVE
Protein, ur: NEGATIVE mg/dL
Specific Gravity, Urine: 1.025 (ref 1.005–1.030)
pH: 5 (ref 5.0–8.0)

## 2015-04-29 LAB — COMPREHENSIVE METABOLIC PANEL
ALT: 27 U/L (ref 17–63)
AST: 23 U/L (ref 15–41)
Albumin: 4.3 g/dL (ref 3.5–5.0)
Alkaline Phosphatase: 86 U/L (ref 38–126)
Anion gap: 13 (ref 5–15)
BUN: 23 mg/dL — ABNORMAL HIGH (ref 6–20)
CO2: 24 mmol/L (ref 22–32)
Calcium: 10.4 mg/dL — ABNORMAL HIGH (ref 8.9–10.3)
Chloride: 102 mmol/L (ref 101–111)
Creatinine, Ser: 1.18 mg/dL (ref 0.61–1.24)
GFR calc Af Amer: >60 mL/min (ref >60)
GFR calc non Af Amer: 56 mL/min — ABNORMAL LOW (ref >60)
Glucose, Bld: 170 mg/dL — ABNORMAL HIGH (ref 65–99)
Potassium: 4.2 mmol/L (ref 3.5–5.1)
Sodium: 139 mmol/L (ref 135–145)
Total Bilirubin: 1.1 mg/dL (ref 0.3–1.2)
Total Protein: 7 g/dL (ref 6.5–8.1)

## 2015-04-29 LAB — APTT: aPTT: 30 seconds (ref 24–37)

## 2015-04-29 LAB — I-STAT CG4 LACTIC ACID, ED: Lactic Acid, Venous: 2.34 mmol/L (ref 0.5–2.0)

## 2015-04-29 LAB — PROCALCITONIN: Procalcitonin: 1.07 ng/mL

## 2015-04-29 LAB — LIPASE, BLOOD: Lipase: 39 U/L (ref 11–51)

## 2015-04-29 LAB — PHOSPHORUS: Phosphorus: 2.4 mg/dL — ABNORMAL LOW (ref 2.5–4.6)

## 2015-04-29 LAB — PROTIME-INR
INR: 1.26 (ref 0.00–1.49)
Prothrombin Time: 16 seconds — ABNORMAL HIGH (ref 11.6–15.2)

## 2015-04-29 LAB — GLUCOSE, CAPILLARY: Glucose-Capillary: 141 mg/dL — ABNORMAL HIGH (ref 65–99)

## 2015-04-29 MED ORDER — IOHEXOL 300 MG/ML  SOLN
80.0000 mL | Freq: Once | INTRAMUSCULAR | Status: AC | PRN
  Administered 2015-04-29: 80 mL via INTRAVENOUS

## 2015-04-29 MED ORDER — ONDANSETRON HCL 4 MG PO TABS: 4.0000 mg | ORAL_TABLET | Freq: Four times a day (QID) | ORAL | Status: DC | PRN

## 2015-04-29 MED ORDER — ASPIRIN 325 MG PO TABS
325.0000 mg | ORAL_TABLET | Freq: Every day | ORAL | Status: DC
  Administered 2015-04-29 – 2015-05-04 (×6): 325 mg via ORAL
  Filled 2015-04-29 (×6): qty 1

## 2015-04-29 MED ORDER — DEXTROSE 5 % IV SOLN
2.0000 g | INTRAVENOUS | Status: DC
  Administered 2015-04-30 – 2015-05-03 (×4): 2 g via INTRAVENOUS
  Filled 2015-04-29 (×5): qty 2

## 2015-04-29 MED ORDER — PROMETHAZINE HCL 25 MG/ML IJ SOLN: 25.0000 mg | Freq: Four times a day (QID) | INTRAMUSCULAR | Status: DC | PRN

## 2015-04-29 MED ORDER — GUAIFENESIN ER 600 MG PO TB12
1200.0000 mg | ORAL_TABLET | Freq: Two times a day (BID) | ORAL | Status: DC
  Administered 2015-04-29 – 2015-05-04 (×11): 1200 mg via ORAL
  Filled 2015-04-29 (×12): qty 2

## 2015-04-29 MED ORDER — SERTRALINE HCL 100 MG PO TABS
100.0000 mg | ORAL_TABLET | Freq: Every day | ORAL | Status: DC
  Administered 2015-04-29 – 2015-05-04 (×6): 100 mg via ORAL
  Filled 2015-04-29 (×2): qty 1
  Filled 2015-04-29: qty 2
  Filled 2015-04-29 (×3): qty 1

## 2015-04-29 MED ORDER — ENOXAPARIN SODIUM 40 MG/0.4ML ~~LOC~~ SOLN
40.0000 mg | SUBCUTANEOUS | Status: DC
  Administered 2015-04-29 – 2015-05-03 (×5): 40 mg via SUBCUTANEOUS
  Filled 2015-04-29 (×6): qty 0.4

## 2015-04-29 MED ORDER — PANTOPRAZOLE SODIUM 40 MG PO TBEC
40.0000 mg | DELAYED_RELEASE_TABLET | Freq: Every day | ORAL | Status: DC
  Administered 2015-04-29 – 2015-05-04 (×6): 40 mg via ORAL
  Filled 2015-04-29 (×7): qty 1

## 2015-04-29 MED ORDER — ONDANSETRON HCL 4 MG/2ML IJ SOLN: 4.0000 mg | Freq: Four times a day (QID) | INTRAMUSCULAR | Status: DC | PRN

## 2015-04-29 MED ORDER — ALIGN 4 MG PO CAPS: 1.0000 | ORAL_CAPSULE | Freq: Every day | ORAL | Status: DC

## 2015-04-29 MED ORDER — IPRATROPIUM-ALBUTEROL 0.5-2.5 (3) MG/3ML IN SOLN
3.0000 mL | RESPIRATORY_TRACT | Status: AC
  Administered 2015-04-29: 3 mL via RESPIRATORY_TRACT
  Filled 2015-04-29: qty 3

## 2015-04-29 MED ORDER — IPRATROPIUM-ALBUTEROL 0.5-2.5 (3) MG/3ML IN SOLN: 3.0000 mL | RESPIRATORY_TRACT | Status: DC | PRN

## 2015-04-29 MED ORDER — AZITHROMYCIN 500 MG IV SOLR
500.0000 mg | Freq: Once | INTRAVENOUS | Status: AC
  Administered 2015-04-29: 500 mg via INTRAVENOUS
  Filled 2015-04-29: qty 500

## 2015-04-29 MED ORDER — ATORVASTATIN CALCIUM 40 MG PO TABS
40.0000 mg | ORAL_TABLET | Freq: Every day | ORAL | Status: DC
  Administered 2015-04-29 – 2015-05-03 (×5): 40 mg via ORAL
  Filled 2015-04-29 (×6): qty 1

## 2015-04-29 MED ORDER — METOCLOPRAMIDE HCL 5 MG/ML IJ SOLN
10.0000 mg | Freq: Once | INTRAMUSCULAR | Status: AC
  Administered 2015-04-29: 10 mg via INTRAVENOUS
  Filled 2015-04-29: qty 2

## 2015-04-29 MED ORDER — DEXTROSE 5 % IV SOLN
500.0000 mg | INTRAVENOUS | Status: DC
  Administered 2015-04-30: 500 mg via INTRAVENOUS
  Filled 2015-04-29 (×2): qty 500

## 2015-04-29 MED ORDER — SODIUM CHLORIDE 0.9 % IV BOLUS (SEPSIS)
1000.0000 mL | Freq: Once | INTRAVENOUS | Status: AC
  Administered 2015-04-29: 1000 mL via INTRAVENOUS

## 2015-04-29 MED ORDER — SODIUM CHLORIDE 0.9 % IV SOLN
INTRAVENOUS | Status: DC
  Administered 2015-04-29 – 2015-05-01 (×3): via INTRAVENOUS

## 2015-04-29 MED ORDER — SODIUM CHLORIDE 0.9 % IJ SOLN
3.0000 mL | Freq: Two times a day (BID) | INTRAMUSCULAR | Status: DC
  Administered 2015-04-29 – 2015-05-04 (×8): 3 mL via INTRAVENOUS

## 2015-04-29 MED ORDER — DEXTROSE 5 % IV SOLN
2.0000 g | Freq: Once | INTRAVENOUS | Status: AC
  Administered 2015-04-29: 2 g via INTRAVENOUS
  Filled 2015-04-29: qty 2

## 2015-04-29 NOTE — ED Notes (Signed)
In room with pt. Pt is tachycardic at 120bpm sinus tach. Pt has vomited again since being given reglan. This RN asked pt if he was in pain or if he could pinpoint what feels bad and the pt replied "I just feel genuinely bad all over" Pt is on 2L [place] now for o2 sat in the low 90s. Pt is uncomfortable. Dr Claudine Mouton notified and pt to get CT of chest.

## 2015-04-29 NOTE — H&P (Signed)
[facility] History and Physical  Eric George [id number] DOB: 30-Sep-1930 DOA: 04/28/2015  Referring physician: Dr. Claudine Mouton - [facility] PCP: Eulas Post, MD   Chief Complaint: N/V  HPI: Eric George is a 81 y.o. male  N/V started at around 21:30 on 04/28/15. Associated with abdominal pain that is generalized but worse in the left lower quadrant region. Reports 3 nonbilious non-bloody emesis at home prior to arrival in ED. States he felt some what better after this. Last bowel movement was the day before admission. Nothing makes the symptoms better or worse. Denies fevers, diarrhea, dysuria, rash, chest pain, palpitations, shortness of breath. Wife and pt unable to give further history at this time due to pts condition and pts elderly wife unable to remember events prior to admission well. Pts wife has been up the entire night in the ED.   Associated with generalized malaise.  No history of A. fib. Confirmed with wife and patient.  While in the ED patient developed hypoxemia with O2 sats around 87% on room air and shortness of breath. Patient with some improvement after breathing treatments. This problem is constant and getting worse.  Review of Systems:  Constitutional:  No weight loss, night sweats, Fevers,  HEENT:  No headaches, Difficulty swallowing,Tooth/dental problems,Sore throat, Cardio-vascular:  No chest pain, swelling in lower extremities, dizziness, palpitations  GI: Per HPI Resp: ?? Cough?? Per description, unclear if related to emesis or if had independently of emesis??? Skin:  no rash or lesions.  GU:  no dysuria, change in color of urine, no urgency or frequency. No flank pain.  Musculoskeletal:   No joint pain or swelling. No decreased range of motion. No back pain.  Psych:  No change in mood or affect. No depression or anxiety. No memory loss.  Neuro:  No change in sensation, unilateral strength, or cognitive abilities  All other systems were  reviewed and are negative.  Past Medical History  Diagnosis Date  . Bowel obstruction ([place])   . Hydrocephalus   . TBI (traumatic brain injury) ([place])   . 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 GERD (gastroesophageal reflux disease)   . Hypertension   . 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 Hyperlipidemia   . Stroke ([place])   . Short-term memory loss     due to TBI 1990   Past Surgical History  Procedure Laterality Date  . Hernia repair    . Prostatectomy    . Brain surgery    . Tee without cardioversion  03/22/2012    Procedure: TRANSESOPHAGEAL ECHOCARDIOGRAM (TEE);  Surgeon: [name], MD;  Location: [facility] ENDOSCOPY;  Service: Cardiovascular;  Laterality: N/A;   Social History:  reports that he quit smoking about 48 years ago. His smoking use included Cigarettes. He has a 7.5 pack-year smoking history. He has never used smokeless tobacco. He reports that he does not drink alcohol or use illicit drugs.  Allergies  Allergen Reactions  . Penicillins Rash    Family History  Problem Relation Age of Onset  . Heart disease Sister      Prior to Admission medications   Medication Sig Start Date End Date Taking? Authorizing Provider  acetic acid-hydrocortisone (VOSOL-HC) otic solution Place 5 drops into both ears every 30 (thirty) days.  02/06/14  Yes Historical Provider, MD  aspirin 325 MG tablet Take 1 tablet (325 mg total) by mouth daily. 03/23/12  Yes Eugenie Filler, MD  atorvastatin (LIPITOR) 40 MG tablet Take 40 mg  by mouth daily.   Yes Historical Provider, MD  irbesartan (AVAPRO) 150 MG tablet TAKE 1 TABLET DAILY 11/06/14  Yes Eulas Post, MD  Multiple Vitamins-Minerals (MACULAR VITAMIN BENEFIT) TABS Take 1 capsule by mouth daily.   Yes Historical Provider, MD  omeprazole-sodium bicarbonate (ZEGERID) 40-1100 MG per capsule Take 1 capsule by mouth 2 (two) times daily.     Yes Historical Provider, MD  Probiotic Product (ALIGN) 4 MG CAPS Take 1 capsule by mouth daily.    Yes Historical Provider, MD  sertraline (ZOLOFT) 100 MG tablet TAKE 1 TABLET DAILY 03/25/15  Yes Eulas Post, MD   Physical Exam: Filed Vitals:   04/29/15 0545 04/29/15 0600 04/29/15 0615 04/29/15 0630  BP: 148/80 127/81 151/86 137/85  Pulse: 98 106 108 112  Temp:    99.2 F (37.3 C)  TempSrc:    Oral  Resp: 21 18 17 23   Height:      Weight:      SpO2: 92% 92% 91% 91%    Wt Readings from Last 3 Encounters:  04/28/15 81.194 kg (179 lb)  04/19/15 81.557 kg (179 lb 12.8 oz)  02/01/15 82.555 kg (182 lb)    General: Appears somewhat anxious and distressed. Elderly Eyes:  PERRL, EOMI, normal lids, iris ENT:  grossly normal hearing, lips  Neck:  no LAD, masses or thyromegaly Cardiovascular: Difficult to appreciate due to ongoing respiratory therapy.  RRR, no II/VI systolic murmur.   Respiratory: On albuterol at time of exam, increased effort, crackles and rhonchi in the bases bilaterally primarily in the left. Decreased breath sounds in left lower lung fields posteriorly. On and [place]. Abdomen:  soft, ntnd Skin:  no rash or induration seen on limited exam Musculoskeletal:  grossly normal tone BUE/BLE Psychiatric:  grossly normal mood and affect, speech fluent and appropriate Neurologic: Slurred speech is at baseline due to TBI, otherwise CN 2-12 grossly intact, moves all extremities in coordinated fashion.          Labs on Admission:  Basic Metabolic Panel:  Recent Labs Lab 04/28/15 2341  NA 139  K 4.2  CL 102  CO2 24  GLUCOSE 170*  BUN 23*  CREATININE 1.18  CALCIUM 10.4*  MG 2.0   Liver Function Tests:  Recent Labs Lab 04/28/15 2341  AST 23  ALT 27  ALKPHOS 86  BILITOT 1.1  PROT 7.0  ALBUMIN 4.3    Recent Labs Lab 04/28/15 2341  LIPASE 39   No results for input(s): AMMONIA in the last 168 hours. CBC:  Recent Labs Lab 04/28/15 2341  WBC 17.7*  NEUTROABS 16.2*  HGB 16.3  HCT 48.7  MCV 86.3  PLT 211   Cardiac Enzymes: No results for  input(s): CKTOTAL, CKMB, CKMBINDEX, TROPONINI in the last 168 hours.  BNP (last 3 results) No results for input(s): BNP in the last 8760 hours.  ProBNP (last 3 results) No results for input(s): PROBNP in the last 8760 hours.   CREATININE: 1.18 (04/28/15 2341) Estimated creatinine clearance - 53 mL/min  CBG: No results for input(s): GLUCAP in the last 168 hours.  Radiological Exams on Admission: Ct Abdomen Pelvis W Contrast  04/29/2015  CLINICAL DATA:  Acute onset of left lower quadrant abdominal pain, nausea and vomiting. Initial encounter. EXAM: CT ABDOMEN AND PELVIS WITH CONTRAST TECHNIQUE: Multidetector CT imaging of the abdomen and pelvis was performed using the standard protocol following bolus administration of intravenous contrast. CONTRAST:  80mL OMNIPAQUE IOHEXOL 300 MG/ML  SOLN COMPARISON:  CT of the abdomen and pelvis from 12/22/2013 FINDINGS: Mild bibasilar atelectasis is noted. A likely 1.0 cm cyst is noted within the hepatic dome. The liver and spleen are otherwise unremarkable. The gallbladder is within normal limits. The pancreas and adrenal glands are unremarkable. The kidneys are unremarkable in appearance. There is no evidence of hydronephrosis. No renal or ureteral stones are seen. No perinephric stranding is appreciated. The small bowel is partially filled with fluid, without evidence of a focal transition point. Trace surrounding free fluid is seen, raising question for a mild infectious or inflammatory process. The stomach is within normal limits. No acute vascular abnormalities are seen. Mild scattered calcification is noted along the abdominal aorta and its branches. The appendix is normal in caliber, without evidence of appendicitis. The colon is largely decompressed. Scattered diverticulosis is noted along the distal descending and proximal sigmoid colon, without evidence of diverticulitis. The bladder is mildly distended and grossly unremarkable. The patient is status  post prostatectomy. No inguinal lymphadenopathy is seen. No acute osseous abnormalities are identified. Facet disease is noted at the lower lumbar spine. IMPRESSION: 1. Small bowel partially filled with fluid, without evidence of a focal transition point. Trace surrounding free fluid noted, raising question for mild infectious or inflammatory process. No evidence of bowel obstruction. 2. Likely small hepatic cyst noted. 3. Mild bibasilar atelectasis noted. 4. Scattered diverticulosis along the distal descending and proximal sigmoid colon, without evidence of diverticulitis. 5. Mild scattered calcification along the abdominal aorta and its branches. Electronically Signed   By: Garald Balding M.D.   On: 04/29/2015 01:28   Dg Chest Port 1 View  04/29/2015  CLINICAL DATA:  Acute onset of hypoxia.  Initial encounter. EXAM: PORTABLE CHEST 1 VIEW COMPARISON:  Chest radiograph performed 03/20/2012 FINDINGS: The lungs are well-aerated see. Patchy left-sided airspace opacification is compatible with pneumonia. There is no evidence of pleural effusion or pneumothorax. The cardiomediastinal silhouette is within normal limits. No acute osseous abnormalities are seen. IMPRESSION: Patchy left-sided pneumonia noted. Electronically Signed   By: Garald Balding M.D.   On: 04/29/2015 05:54     Assessment/Plan Active Problems:   Hyperlipidemia   Essential hypertension   GERD   Memory loss   Nausea & vomiting   Sepsis ([place])   History of small bowel obstruction   History of traumatic brain injury   Acute respiratory failure ([facility])   Acute Respiratory Failure / Sepsis: secondary to CAP. Question Aspiration but less likely. New O2 requirement, CXR concerning for L sided pneumonia. WBC 17 w/ L shift, Lactic acid 2.34, Tachycardic and Tachypneic. Afebrile.  - Stepdown - Trend Lactic acid - IVF (bolus and maintenance)  - Azithro, CTX - sputum Cx, BCX,  - Legionella ag and strep Ag - Mucinex - Duonebs - O2  prn  Nausea/Vomiting: resolved after treatment in ED. H/o 3 SBO but not apparent on CT abd, though possible early enteritis. Viral?? - NPO, consider Advance to clear liquids in PM - Zofran, probiotic, reglan - Consider changing ABX if not improving  H/O TBI/Stroke (1990): residual memory loss and speech difficulty. At baseline.  - monitor - continue ASA 325  HTN: - hold home Avapro until stabilizes - Hydralazine PRN  GERD: - continue Zegrerid  Depression: - continue Zoloft  HLD:  - continue statin  Code Status: FULL  DVT Prophylaxis: Lovenox Family Communication: wife Disposition Plan: Pending Improvement    Aimee Heldman J, MD Family Medicine [facility] [email or web address] Password [email or web address]

## 2015-04-29 NOTE — ED Notes (Signed)
Pt.sats in the 87% o2 without o2 .placept. On nasal cannal at 3liters. SATS IS AT 92%a

## 2015-04-29 NOTE — ED Notes (Signed)
Family at bedside. 

## 2015-04-29 NOTE — Progress Notes (Signed)
ABX consult  Admitted for PNA. He was started on appropriate therapy with Roc/Azith. These will not require renals adjustements. If he is switch to something else, let us know.  Cont same dose Roc/Azith Rx will sign off  Onnie Boer, PharmD Pager: [phone] 04/29/2015 2:17 PM

## 2015-04-29 NOTE — ED Notes (Signed)
Pt vomiting again. Dr Claudine Mouton notified and gave verbal order for reglan

## 2015-04-29 NOTE — ED Notes (Signed)
Gave pts. Wife crackers and peanut butter with orange juice

## 2015-04-29 NOTE — Progress Notes (Signed)
ANTIBIOTIC CONSULT NOTE - INITIAL  Pharmacy Consult for azithro and Rocephin Indication: CAP  Allergies  Allergen Reactions  . Penicillins Rash   Labs:  Recent Labs  04/28/15 2341  WBC 17.7*  HGB 16.3  PLT 211  CREATININE 1.18     Assessment/Plan:  81yo male c/o abdominal pain w/ N/V, CT negative for SBO but shows possible enteritis, CXR shows PNA, to begin IV ABX for CAP.  Will start azithromycin 500mg  IV Q24H and Rocephin 1g IV Q24H and monitor CBC and Cx.  Wynona Neat, PharmD, BCPS  04/29/2015,7:44 AM

## 2015-04-29 NOTE — Progress Notes (Signed)
Utilization Review Completed.Riyad Keena T12/26/2016  

## 2015-04-29 NOTE — ED Notes (Signed)
Patient transported to CT 

## 2015-04-29 NOTE — ED Notes (Signed)
Attempted to assist pt. To urinate.  Clothing removed and given to pt.s wife.  Pt. Becomes sob with exertion.  He will urinate in a few minutes.  Wife will assist

## 2015-04-30 DIAGNOSIS — R112 Nausea with vomiting, unspecified: Secondary | ICD-10-CM

## 2015-04-30 DIAGNOSIS — J690 Pneumonitis due to inhalation of food and vomit: Secondary | ICD-10-CM

## 2015-04-30 DIAGNOSIS — A419 Sepsis, unspecified organism: Principal | ICD-10-CM

## 2015-04-30 DIAGNOSIS — J9601 Acute respiratory failure with hypoxia: Secondary | ICD-10-CM

## 2015-04-30 LAB — URINE CULTURE

## 2015-04-30 LAB — CBC
HCT: 39.7 % (ref 39.0–52.0)
Hemoglobin: 12.9 g/dL — ABNORMAL LOW (ref 13.0–17.0)
MCH: 28.2 pg (ref 26.0–34.0)
MCHC: 32.5 g/dL (ref 30.0–36.0)
MCV: 86.7 fL (ref 78.0–100.0)
Platelets: 154 10*3/uL (ref 150–400)
RBC: 4.58 MIL/uL (ref 4.22–5.81)
RDW: 13.6 % (ref 11.5–15.5)
WBC: 10.1 10*3/uL (ref 4.0–10.5)

## 2015-04-30 LAB — COMPREHENSIVE METABOLIC PANEL
ALT: 17 U/L (ref 17–63)
AST: 18 U/L (ref 15–41)
Albumin: 2.8 g/dL — ABNORMAL LOW (ref 3.5–5.0)
Alkaline Phosphatase: 55 U/L (ref 38–126)
Anion gap: 8 (ref 5–15)
BUN: 19 mg/dL (ref 6–20)
CO2: 22 mmol/L (ref 22–32)
Calcium: 8.5 mg/dL — ABNORMAL LOW (ref 8.9–10.3)
Chloride: 110 mmol/L (ref 101–111)
Creatinine, Ser: 0.97 mg/dL (ref 0.61–1.24)
GFR calc Af Amer: >60 mL/min (ref >60)
GFR calc non Af Amer: >60 mL/min (ref >60)
Glucose, Bld: 109 mg/dL — ABNORMAL HIGH (ref 65–99)
Potassium: 4.1 mmol/L (ref 3.5–5.1)
Sodium: 140 mmol/L (ref 135–145)
Total Bilirubin: 0.9 mg/dL (ref 0.3–1.2)
Total Protein: 4.9 g/dL — ABNORMAL LOW (ref 6.5–8.1)

## 2015-04-30 LAB — STREP PNEUMONIAE URINARY ANTIGEN: Strep Pneumo Urinary Antigen: NEGATIVE

## 2015-04-30 LAB — HIV ANTIBODY (ROUTINE TESTING W REFLEX): HIV Screen 4th Generation wRfx: NONREACTIVE

## 2015-04-30 MED ORDER — IRBESARTAN 300 MG PO TABS
150.0000 mg | ORAL_TABLET | Freq: Every day | ORAL | Status: DC
  Administered 2015-04-30 – 2015-05-01 (×2): 150 mg via ORAL
  Filled 2015-04-30 (×2): qty 1

## 2015-04-30 MED ORDER — LEVALBUTEROL HCL 0.63 MG/3ML IN NEBU: 0.6300 mg | INHALATION_SOLUTION | RESPIRATORY_TRACT | Status: DC | PRN

## 2015-04-30 MED ORDER — HYDRALAZINE HCL 20 MG/ML IJ SOLN: 10.0000 mg | INTRAMUSCULAR | Status: DC | PRN
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
## 2015-05-01 ENCOUNTER — Encounter: Payer: Self-pay | Admitting: *Deleted

## 2015-05-01 ENCOUNTER — Inpatient Hospital Stay (HOSPITAL_COMMUNITY): Payer: Medicare Other

## 2015-05-01 LAB — CBC
HCT: 38.1 % — ABNORMAL LOW (ref 39.0–52.0)
Hemoglobin: 12.5 g/dL — ABNORMAL LOW (ref 13.0–17.0)
MCH: 28.7 pg (ref 26.0–34.0)
MCHC: 32.8 g/dL (ref 30.0–36.0)
MCV: 87.4 fL (ref 78.0–100.0)
Platelets: 161 10*3/uL (ref 150–400)
RBC: 4.36 MIL/uL (ref 4.22–5.81)
RDW: 13.6 % (ref 11.5–15.5)
WBC: 9.6 10*3/uL (ref 4.0–10.5)

## 2015-05-01 LAB — COMPREHENSIVE METABOLIC PANEL
ALT: 15 U/L — ABNORMAL LOW (ref 17–63)
AST: 15 U/L (ref 15–41)
Albumin: 2.7 g/dL — ABNORMAL LOW (ref 3.5–5.0)
Alkaline Phosphatase: 53 U/L (ref 38–126)
Anion gap: 6 (ref 5–15)
BUN: 16 mg/dL (ref 6–20)
CO2: 21 mmol/L — ABNORMAL LOW (ref 22–32)
Calcium: 8.5 mg/dL — ABNORMAL LOW (ref 8.9–10.3)
Chloride: 113 mmol/L — ABNORMAL HIGH (ref 101–111)
Creatinine, Ser: 1.01 mg/dL (ref 0.61–1.24)
GFR calc Af Amer: >60 mL/min (ref >60)
GFR calc non Af Amer: >60 mL/min (ref >60)
Glucose, Bld: 131 mg/dL — ABNORMAL HIGH (ref 65–99)
Potassium: 4.1 mmol/L (ref 3.5–5.1)
Sodium: 140 mmol/L (ref 135–145)
Total Bilirubin: 0.7 mg/dL (ref 0.3–1.2)
Total Protein: 5.2 g/dL — ABNORMAL LOW (ref 6.5–8.1)

## 2015-05-01 MED ORDER — METOPROLOL TARTRATE 12.5 MG HALF TABLET
12.5000 mg | ORAL_TABLET | Freq: Two times a day (BID) | ORAL | Status: DC
  Administered 2015-05-01 – 2015-05-04 (×6): 12.5 mg via ORAL
  Filled 2015-05-01 (×7): qty 1

## 2015-05-01 MED ORDER — AZITHROMYCIN 500 MG PO TABS
500.0000 mg | ORAL_TABLET | Freq: Every day | ORAL | Status: DC
  Administered 2015-05-01 – 2015-05-04 (×4): 500 mg via ORAL
  Filled 2015-05-01 (×5): qty 1

## 2015-05-01 NOTE — Care Management Note (Signed)
Case Management Note  Patient Details  Name: Eric George MRN: [id number] Date of Birth: August 06, 1930  Subjective/Objective:             Admitted with sepsis. From home with wife. Independent with ADL's pta. No DME usage.       Action/Plan: Return to home when medically stable. CM to f/u with disposition needs.  Expected Discharge Date:                  Expected Discharge Plan:  Home/Self Care  In-House Referral:     Discharge planning Services  CM Consult  Post Acute Care Choice:    Choice offered to:     DME Arranged:    DME Agency:     HH Arranged:    HH Agency:     Status of Service:  In process, will continue to follow  Medicare Important Message Given:    Date Medicare IM Given:    Medicare IM give by:    Date Additional Medicare IM Given:    Additional Medicare Important Message give by:     If discussed at [place] of Stay Meetings, dates discussed:    Additional Comments:  CM spoke to pt/wife regarding discharge planning. Per pt /wife plan is to d/c to home with with assistance from wife if needed. Per PT's recommendation pt could benefit from rolling walker. DME order received for rolling walker and CM called Jerm[facility]) @ [phone]  and referral placed for rolling walker.   Sharn Dugan [phone], Kazimir Devisser [facility]) [phone]  [place], [place] [phone] 05/01/2015, 10:20 AM

## 2015-05-01 NOTE — Progress Notes (Signed)
PHARMACIST - PHYSICIAN COMMUNICATION DR:   TRH CONCERNING: Antibiotic IV to Oral Route Change Policy  RECOMMENDATION: This patient is receiving azithromycin by the intravenous route.  Based on criteria approved by the Pharmacy and Therapeutics Committee, the antibiotic(s) is/are being converted to the equivalent oral dose form(s).   DESCRIPTION: These criteria include:  Patient being treated for a respiratory tract infection, urinary tract infection, cellulitis or clostridium difficile associated diarrhea if on metronidazole  The patient is not neutropenic and does not exhibit a GI malabsorption state  The patient is eating (either orally or via tube) and/or has been taking other orally administered medications for a least 24 hours  The patient is improving clinically and has a Tmax < 100.5  If you have questions about this conversion, please contact the Pharmacy Department  []  ( [phone] )  [place] []  ( [phone] )  [facility] [x]  ( [phone] )  [place] []  ( [phone] )  [facility] []  ( [phone] )  [facility]   
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
## 2015-05-01 NOTE — Evaluation (Signed)
Physical Therapy Evaluation Patient Details Name: Eric George MRN: [id number] DOB: Nov 18, 1930 Today's Date: 05/01/2015   History of Present Illness  Pt is a 81 y/o M admitted w/ abdominal pain and dx with sepsis due to PNA.  Pt's PMH includes TBI, colloid cyst of brain, stroke, short term memory loss.  Clinical Impression  Pt admitted with above diagnosis. Pt currently with functional limitations due to the deficits listed below (see PT Problem List). Eric George is from home and will have 24/7 assist available from his wife at d/c.  He ambulated 100 ft using RW this session, SpO2 down to 88% on RA.  He remains more confused than his baseline.  Pt will benefit from skilled PT to increase their independence and safety with mobility to allow discharge to the venue listed below.      Follow Up Recommendations No PT follow up;Supervision/Assistance - 24 hour    Equipment Recommendations  Rolling walker with 5" wheels    Recommendations for Other Services OT consult     Precautions / Restrictions Precautions Precautions: Fall Precaution Comments: monitor O2 Restrictions Weight Bearing Restrictions: No      Mobility  Bed Mobility Overal bed mobility: Needs Assistance Bed Mobility: Supine to Sit     Supine to sit: Min guard     General bed mobility comments: Cues for technique, HOB elevated, and increased time.  Transfers Overall transfer level: Needs assistance Equipment used: Rolling walker (2 wheeled) Transfers: Sit to/from Stand Sit to Stand: Min guard         General transfer comment: Min guard for pt safety.  Cues for hand placement using RW.  Pt denies any dizziness.    Ambulation/Gait Ambulation/Gait assistance: Min guard Ambulation Distance (Feet): 100 Feet Assistive device: Rolling walker (2 wheeled) Gait Pattern/deviations: Step-through pattern;Decreased stride length;Antalgic;Trunk flexed   Gait velocity interpretation: Below normal speed for  age/gender General Gait Details: Trunk slightly flexed, cues to stand upright and look ahead where he is walking.  Standing rest break x1 w/ SpO2 reading 88% on RA; however, poor waveform.    Stairs            Wheelchair Mobility    Modified Rankin (Stroke Patients Only)       Balance Overall balance assessment: Needs assistance Sitting-balance support: Feet supported Sitting balance-Leahy Scale: Good     Standing balance support: Bilateral upper extremity supported;During functional activity Standing balance-Leahy Scale: Fair Standing balance comment: No overt LOB; min instability noted.                             Pertinent Vitals/Pain Pain Assessment: No/denies pain    Home Living Family/patient expects to be discharged to:: Private residence Living Arrangements: Spouse/significant other Available Help at Discharge: Family;Available 24 hours/day (wife) Type of Home: House Home Access: Level entry     Home Layout: One level Home Equipment: Cane - single point      Prior Function Level of Independence: Independent with assistive device(s)         Comments: Uses cane when he goes for his mile long walk in the morning and afternoon     Hand Dominance        Extremity/Trunk Assessment   Upper Extremity Assessment: Defer to OT evaluation;RUE deficits/detail           Lower Extremity Assessment: RLE deficits/detail;LLE deficits/detail RLE Deficits / Details: grossly 4/5 strength LLE Deficits / Details: grossly 4/5  strength     Communication   Communication: No difficulties  Cognition Arousal/Alertness: Awake/alert Behavior During Therapy: WFL for tasks assessed/performed Overall Cognitive Status: Impaired/Different from baseline Area of Impairment: Safety/judgement;Memory;Orientation Orientation Level: Disoriented to;Time (originally reports it is 81)   Memory: Decreased short-term memory   Safety/Judgement: Decreased awareness  of safety;Decreased awareness of deficits     General Comments: Reports inaccurate home living information, pt's wife corrected.  Pt unable to remember his room number after 2 minutes.    General Comments      Exercises General Exercises - Lower Extremity Ankle Circles/Pumps: AROM;Both;10 reps;Seated Long Arc Quad: AROM;Both;10 reps;Seated      Assessment/Plan    PT Assessment Patient needs continued PT services  PT Diagnosis Generalized weakness;Altered mental status   PT Problem List Decreased strength;Decreased activity tolerance;Decreased balance;Decreased mobility;Decreased cognition;Decreased knowledge of use of DME;Decreased safety awareness;Decreased knowledge of precautions;Cardiopulmonary status limiting activity  PT Treatment Interventions DME instruction;Gait training;Functional mobility training;Therapeutic activities;Therapeutic exercise;Balance training;Neuromuscular re-education;Cognitive remediation;Patient/family education   PT Goals (Current goals can be found in the Care Plan section) Acute Rehab PT Goals Patient Stated Goal: to go home PT Goal Formulation: With patient/family Time For Goal Achievement: 05/15/15 Potential to Achieve Goals: Good    Frequency Min 3X/week   Barriers to discharge        Co-evaluation               End of Session Equipment Utilized During Treatment: Gait belt;Oxygen Activity Tolerance: Patient tolerated treatment well Patient left: in chair;with call bell/phone within reach;with chair alarm set;with family/visitor present;with nursing/sitter in room Nurse Communication: Mobility status;Other (comment) (SpO2)         Time: [id number] PT Time Calculation (min) (ACUTE ONLY): 32 min   Charges:   PT Evaluation $Initial PT Evaluation Tier I: 1 Procedure PT Treatments $Gait Training: 8-22 mins   PT G CodesJoslyn Hy PT, DPT [phone] Pager: [phone] 05/01/2015, 12:14 PM

## 2015-05-02 ENCOUNTER — Inpatient Hospital Stay (HOSPITAL_COMMUNITY): Payer: Medicare Other

## 2015-05-02 DIAGNOSIS — I10 Essential (primary) hypertension: Secondary | ICD-10-CM

## 2015-05-02 DIAGNOSIS — K219 Gastro-esophageal reflux disease without esophagitis: Secondary | ICD-10-CM

## 2015-05-02 DIAGNOSIS — Z87820 Personal history of traumatic brain injury: Secondary | ICD-10-CM

## 2015-05-02 DIAGNOSIS — R413 Other amnesia: Secondary | ICD-10-CM

## 2015-05-02 MED ORDER — FUROSEMIDE 10 MG/ML IJ SOLN
40.0000 mg | Freq: Once | INTRAMUSCULAR | Status: AC
  Administered 2015-05-02: 40 mg via INTRAVENOUS
  Filled 2015-05-02: qty 4

## 2015-05-02 MED ORDER — IRBESARTAN 300 MG PO TABS
150.0000 mg | ORAL_TABLET | Freq: Every day | ORAL | Status: DC
  Administered 2015-05-03 – 2015-05-04 (×2): 150 mg via ORAL
  Filled 2015-05-02 (×2): qty 1

## 2015-05-02 NOTE — Care Management Important Message (Signed)
Important Message  Patient Details  Name: Eric George MRN: [id number] Date of Birth: 02-26-1931   Medicare Important Message Given:  Yes    [name] May 05/02/2015, 12:34 PM

## 2015-05-02 NOTE — Evaluation (Signed)
Occupational Therapy Evaluation and Discharge Patient Details Name: Eric George MRN: [id number] DOB: 04-02-1931 Today's Date: 05/02/2015    History of Present Illness Pt is a 81 y/o M admitted w/ abdominal pain and dx with sepsis due to PNA.  Pt's PMH includes TBI, colloid cyst of brain, stroke, short term memory loss.   Clinical Impression   Pt was independent in ADL prior to admission, walked in house without a device and outside with a cane.  Pt presents somewhat more confused than baseline since he is out of his home environment and routine.  Requiring supervision for safety with ADL and 3L 02. Educated wife in availability of tub equipment to enhance safety and for energy conservation and to supervise tub transfers.  Wife verbalizing understanding of all.  No further OT needs.    Follow Up Recommendations  No OT follow up;Supervision/Assistance - 24 hour    Equipment Recommendations  None recommended by OT    Recommendations for Other Services       Precautions / Restrictions Precautions Precautions: Fall Precaution Comments: monitor O2 Restrictions Weight Bearing Restrictions: No      Mobility Bed Mobility               General bed mobility comments: pt in chair  Transfers Overall transfer level: Needs assistance Equipment used: None Transfers: Sit to/from Stand Sit to Stand: Supervision         General transfer comment: supervision in room without device    Balance                                            ADL Overall ADL's : Needs assistance/impaired Eating/Feeding: Independent;Sitting   Grooming: Wash/dry hands;Standing;Supervision/safety   Upper Body Bathing: Set up;Sitting   Lower Body Bathing: Supervison/ safety;Sit to/from stand   Upper Body Dressing : Set up;Sitting   Lower Body Dressing: Supervision/safety;Sit to/from stand   Toilet Transfer: Supervision/safety;Ambulation   Toileting- Clothing Manipulation  and Hygiene: Supervision/safety;Sit to/from stand       Functional mobility during ADLs: Supervision/safety (did not use a device within room) General ADL Comments: Recommended suction cup grab bar and supervision as pt steps over edge of tub, wife not interested in shower seat with hand held shower head,     Vision     Perception     Praxis      Pertinent Vitals/Pain Pain Assessment: No/denies pain     Hand Dominance Right   Extremity/Trunk Assessment Upper Extremity Assessment Upper Extremity Assessment: Overall WFL for tasks assessed   Lower Extremity Assessment Lower Extremity Assessment: Defer to PT evaluation   Cervical / Trunk Assessment Cervical / Trunk Assessment: Normal   Communication Communication Communication: No difficulties   Cognition Arousal/Alertness: Awake/alert Behavior During Therapy: WFL for tasks assessed/performed Overall Cognitive Status: Impaired/Different from baseline Area of Impairment: Safety/judgement;Memory;Orientation     Memory: Decreased short-term memory   Safety/Judgement: Decreased awareness of safety;Decreased awareness of deficits     General Comments: Pt having more confusion being out of his routine/environment per wife.   General Comments       Exercises       Shoulder Instructions      Home Living Family/patient expects to be discharged to:: Private residence Living Arrangements: Spouse/significant other Available Help at Discharge: Family;Available 24 hours/day Type of Home: House Home Access: Level entry  Home Layout: One level     Bathroom Shower/Tub: Teacher, early years/pre: Standard     Home Equipment: Cane - single point   Additional Comments: per wife, they have tried various tub seats/benches, none of which were satisfactory      Prior Functioning/Environment Level of Independence: Independent with assistive device(s)        Comments: Uses cane when he goes for his mile  long walk in the morning and afternoon    OT Diagnosis: Generalized weakness;Cognitive deficits   OT Problem List:     OT Treatment/Interventions:      OT Goals(Current goals can be found in the care plan section) Acute Rehab OT Goals Patient Stated Goal: to go home  OT Frequency:     Barriers to D/C:            Co-evaluation              End of Session    Activity Tolerance: Patient tolerated treatment well Patient left:  (in w/c to go to xray)   Time: [id number] OT Time Calculation (min): 25 min Charges:  OT General Charges $OT Visit: 1 Procedure OT Evaluation $Initial OT Evaluation Tier I: 1 Procedure OT Treatments $Self Care/Home Management : 8-22 mins G-Codes:    Malka So 05/02/2015, 9:46 AM  [phone]

## 2015-05-02 NOTE — Progress Notes (Signed)
Patient sp02 on room air at rest 95%. While ambulating in hall Sp02 90-91% on room air. Patient tolerated walk well. MD paged to make aware.
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
## 2015-05-02 NOTE — Care Management Note (Signed)
Case Management Note  Patient Details  Name: Eric George MRN: [id number] Date of Birth: Aug 14, 1930  Subjective/Objective:                  Date-05-02-15 Initial Assessment Patient transferred from another unit within last 24 hours. Spoke with patient at the bedside along with wife.  Introduced self as Tourist information centre manager and explained role in discharge planning and how to be reached.  Verified patient lives at home with wife.  Verified patient anticipates to go home with spouse,  at time of discharge and will have part-time supervision by family  at this time to best of their knowledge.  Patient has DME cane. Expressed potential need for no other DME.  Patient denied   needing help with their medication.  Patient is driven by wife to MD appointments.  Verified patient has PCP [place]te Patient states they currently receive [place] services through no one.    Plan: CM will continue to follow for discharge planning and [facility] resources.   Carles Collet RN BSN CM [phone]   Action/Plan:  No CM needs identified at this time.  Expected Discharge Date:                  Expected Discharge Plan:  Home/Self Care  In-House Referral:     Discharge planning Services  CM Consult  Post Acute Care Choice:    Choice offered to:     DME Arranged:    DME Agency:     HH Arranged:    [place] Agency:     Status of Service:  Completed, signed off  Medicare Important Message Given:  Yes Date Medicare IM Given:    Medicare IM give by:    Date Additional Medicare IM Given:    Additional Medicare Important Message give by:     If discussed at [place] of Stay Meetings, dates discussed:    Additional Comments:  Carles Collet, RN 05/02/2015, 3:59 PM

## 2015-05-02 NOTE — Progress Notes (Signed)
Sp02 83% on room air while ambulating. Sp02 90% on 3L [place].

## 2015-05-02 NOTE — Progress Notes (Signed)
SATURATION QUALIFICATIONS: (This note is used to comply with regulatory documentation for home oxygen)  Patient Saturations on Room Air at Rest = 87%  Patient Saturations on Room Air while Ambulating = 83%  Patient Saturations on 2 Liters of oxygen while Ambulating = 85%  Patient Saturations on 4 Liters of oxygen while Ambulating = 88%  Patient denied SOB at rest on room air and while ambulating. Following ambulation patient stated he did have shortness of breath. Patient used incentive spirometer prior to and following ambulation. Patient mouth breathing and encouraged to take slow deep breaths through his nose with the oxygen flowing. Patient up in chair and Sp02 88-90 on 2L [place].

## 2015-05-03 ENCOUNTER — Inpatient Hospital Stay (HOSPITAL_COMMUNITY): Payer: Medicare Other

## 2015-05-03 DIAGNOSIS — J9600 Acute respiratory failure, unspecified whether with hypoxia or hypercapnia: Secondary | ICD-10-CM

## 2015-05-03 DIAGNOSIS — Z8719 Personal history of other diseases of the digestive system: Secondary | ICD-10-CM

## 2015-05-03 MED ORDER — CEFPODOXIME PROXETIL 200 MG PO TABS
200.0000 mg | ORAL_TABLET | Freq: Two times a day (BID) | ORAL | Status: DC
  Administered 2015-05-03 – 2015-05-04 (×3): 200 mg via ORAL
  Filled 2015-05-03 (×5): qty 1

## 2015-05-03 MED ORDER — FUROSEMIDE 20 MG PO TABS
20.0000 mg | ORAL_TABLET | Freq: Once | ORAL | Status: AC
  Administered 2015-05-03: 20 mg via ORAL
  Filled 2015-05-03: qty 1

## 2015-05-03 NOTE — Progress Notes (Signed)
Physical therapist reported to RN that patient's HR dropped to 35 while ambulating. RN assess and HR irregular, but rate 70-80. Pulse 0x not giving accurate HR reading r/t irregular rate. Dr. Candiss Norse made aware and 12 lead EKG ordered. MD aware of results.    Patient ambulated around unit with RN and Sp02 dropped to 87% on room air. Room air at rest Sp02 90-93%.

## 2015-05-03 NOTE — Care Management Note (Signed)
Case Management Note  Patient Details  Name: Eric George MRN: [id number] Date of Birth: Jun 01, 1930  Subjective/Objective:               Date-05-02-15 Initial Assessment Patient transferred from another unit within last 24 hours. Spoke with patient at the bedside along with wife.  Introduced self as Tourist information centre manager and explained role in discharge planning and how to be reached.  Verified patient lives at home with wife.  Verified patient anticipates to go home with spouse, at time of discharge and will have part-time supervision by family at this time to best of their knowledge.  Patient has DME cane. Expressed potential need for no other DME.  Patient denied needing help with their medication.  Patient is driven by wife to MD appointments.  Verified patient has PCP Burchette Patient states they currently receive [place] services through no one.        Action/Plan:  Referral made for rolling walker and oxygen to [facility].  Expected Discharge Date:                  Expected Discharge Plan:  Home/Self Care  In-House Referral:     Discharge planning Services  CM Consult  Post Acute Care Choice:  Durable Medical Equipment Choice offered to:  Patient  DME Arranged:  Oxygen, Walker rolling DME Agency:  [place]:    [place] Agency:     Status of Service:  In process, will continue to follow  Medicare Important Message Given:  Yes Date Medicare IM Given:    Medicare IM give by:    Date Additional Medicare IM Given:    Additional Medicare Important Message give by:     If discussed at [place] of Stay Meetings, dates discussed:    Additional Comments:  Carles Collet, RN 05/03/2015, 3:42 PM

## 2015-05-03 NOTE — Progress Notes (Signed)
SATURATION QUALIFICATIONS: (This note is used to comply with regulatory documentation for home oxygen)  Patient Saturations on Room Air at Rest = 86%    Patient Saturations on 4 Liters of oxygen while Ambulating = 87%  Santiago Glad L. Tamala Julian, [place] Pager [phone] 05/03/2015
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
## 2015-05-03 NOTE — Progress Notes (Signed)
Pt O2  Level dropped to 89 after being weaned off from the oxygen for the night, pt now on 2l of oxygen

## 2015-05-04 LAB — CBC
HCT: 40.8 % (ref 39.0–52.0)
Hemoglobin: 13.8 g/dL (ref 13.0–17.0)
MCH: 28.8 pg (ref 26.0–34.0)
MCHC: 33.8 g/dL (ref 30.0–36.0)
MCV: 85 fL (ref 78.0–100.0)
Platelets: 203 10*3/uL (ref 150–400)
RBC: 4.8 MIL/uL (ref 4.22–5.81)
RDW: 13.2 % (ref 11.5–15.5)
WBC: 13.1 10*3/uL — ABNORMAL HIGH (ref 4.0–10.5)

## 2015-05-04 LAB — BASIC METABOLIC PANEL
Anion gap: 11 (ref 5–15)
BUN: 18 mg/dL (ref 6–20)
CO2: 22 mmol/L (ref 22–32)
Calcium: 9 mg/dL (ref 8.9–10.3)
Chloride: 109 mmol/L (ref 101–111)
Creatinine, Ser: 0.83 mg/dL (ref 0.61–1.24)
GFR calc Af Amer: >60 mL/min (ref >60)
GFR calc non Af Amer: >60 mL/min (ref >60)
Glucose, Bld: 123 mg/dL — ABNORMAL HIGH (ref 65–99)
Potassium: 3.5 mmol/L (ref 3.5–5.1)
Sodium: 142 mmol/L (ref 135–145)

## 2015-05-04 LAB — CULTURE, BLOOD (ROUTINE X 2)
Culture: NO GROWTH
Culture: NO GROWTH

## 2015-05-04 LAB — MAGNESIUM: Magnesium: 1.9 mg/dL (ref 1.7–2.4)

## 2015-05-04 MED ORDER — CEFPODOXIME PROXETIL 200 MG PO TABS: 200.0000 mg | ORAL_TABLET | Freq: Two times a day (BID) | ORAL | Status: DC

## 2015-05-04 MED ORDER — AZITHROMYCIN 500 MG PO TABS: 500.0000 mg | ORAL_TABLET | Freq: Every day | ORAL | Status: DC

## 2015-05-04 MED ORDER — ACETAMINOPHEN 325 MG PO TABS
650.0000 mg | ORAL_TABLET | Freq: Four times a day (QID) | ORAL | Status: DC | PRN
  Administered 2015-05-04: 650 mg via ORAL
  Filled 2015-05-04: qty 2

## 2015-05-04 MED ORDER — LEVALBUTEROL HCL 0.63 MG/3ML IN NEBU: 0.6300 mg | INHALATION_SOLUTION | RESPIRATORY_TRACT | Status: DC | PRN

## 2015-05-04 MED ORDER — CARVEDILOL 3.125 MG PO TABS: 3.1250 mg | ORAL_TABLET | Freq: Two times a day (BID) | ORAL | Status: DC
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
## 2015-05-04 NOTE — Progress Notes (Signed)
Pt SPO2 drop to 89-90 with no O2 on but dont look exausted or difficulty in breathing had a walk several times within the shift with no difficulty, he spike a temp 100.2  Gave him tylenol and it dropped to temp 97.8 MD on call was paged about pt condition

## 2015-05-04 NOTE — Discharge Instructions (Signed)
Follow with Primary MD Eulas Post, MD in 7 days   Get CBC, CMP, 2 view Chest X ray checked  by Primary MD next visit.    Activity: As tolerated with Full fall precautions use walker/cane & assistance as needed   Disposition Home     Diet:   Heart Healthy  with feeding assistance and aspiration precautions.  For Heart failure patients - Check your Weight same time everyday, if you gain over 2 pounds, or you develop in leg swelling, experience more shortness of breath or chest pain, call your Primary MD immediately. Follow Cardiac Low Salt Diet and 1.5 lit/day fluid restriction.   On your next visit with your primary care physician please Get Medicines reviewed and adjusted.   Please request your Prim.MD to go over all Hospital Tests and Procedure/Radiological results at the follow up, please get all Hospital records sent to your Prim MD by signing hospital release before you go home.   If you experience worsening of your admission symptoms, develop shortness of breath, life threatening emergency, suicidal or homicidal thoughts you must seek medical attention immediately by calling 911 or calling your MD immediately  if symptoms less severe.  You Must read complete instructions/literature along with all the possible adverse reactions/side effects for all the Medicines you take and that have been prescribed to you. Take any new Medicines after you have completely understood and accpet all the possible adverse reactions/side effects.   Do not drive, operating heavy machinery, perform activities at heights, swimming or participation in water activities or provide baby sitting services if your were admitted for syncope or siezures until you have seen by Primary MD or a Neurologist and advised to do so again.  Do not drive when taking Pain medications.    Do not take more than prescribed Pain, Sleep and Anxiety Medications  Special Instructions: If you have smoked or chewed Tobacco   in the last 2 yrs please stop smoking, stop any regular Alcohol  and or any Recreational drug use.  Wear Seat belts while driving.   Please note  You were cared for by a hospitalist during your hospital stay. If you have any questions about your discharge medications or the care you received while you were in the hospital after you are discharged, you can call the unit and asked to speak with the hospitalist on call if the hospitalist that took care of you is not available. Once you are discharged, your primary care physician will handle any further medical issues. Please note that NO REFILLS for any discharge medications will be authorized once you are discharged, as it is imperative that you return to your primary care physician (or establish a relationship with a primary care physician if you do not have one) for your aftercare needs so that they can reassess your need for medications and monitor your lab values.

## 2015-05-04 NOTE — Progress Notes (Signed)
Nsg Discharge Note  Admit Date:  04/28/2015 Discharge date: 05/04/2015   Eric George to be D/C'd home per MD order.  AVS completed.  Copy for chart, and copy for patient signed, and dated. Patient/spouse able to verbalize understanding.  Discharge Medication:   Medication List    TAKE these medications        acetic acid-hydrocortisone otic solution  Commonly known as:  VOSOL-HC  Place 5 drops into both ears every 30 (thirty) days.     ALIGN 4 MG Caps  Take 1 capsule by mouth daily.     aspirin 325 MG tablet  Take 1 tablet (325 mg total) by mouth daily.     atorvastatin 40 MG tablet  Commonly known as:  LIPITOR  Take 40 mg by mouth daily.     azithromycin 500 MG tablet  Commonly known as:  ZITHROMAX  Take 1 tablet (500 mg total) by mouth daily.     carvedilol 3.125 MG tablet  Commonly known as:  COREG  Take 1 tablet (3.125 mg total) by mouth 2 (two) times daily with a meal.     cefpodoxime 200 MG tablet  Commonly known as:  VANTIN  Take 1 tablet (200 mg total) by mouth every 12 (twelve) hours.     irbesartan 150 MG tablet  Commonly known as:  AVAPRO  TAKE 1 TABLET DAILY     levalbuterol 0.63 MG/3ML nebulizer solution  Commonly known as:  XOPENEX  Take 3 mLs (0.63 mg total) by nebulization every 3 (three) hours as needed for wheezing.     MACULAR VITAMIN BENEFIT Tabs  Take 1 capsule by mouth daily.     omeprazole-sodium bicarbonate 40-1100 MG capsule  Commonly known as:  ZEGERID  Take 1 capsule by mouth 2 (two) times daily.     sertraline 100 MG tablet  Commonly known as:  ZOLOFT  TAKE 1 TABLET DAILY        Discharge Assessment: Filed Vitals:   05/04/15 1012 05/04/15 1356  BP: 141/106 141/84  Pulse: 112 63  Temp:  98.9 F (37.2 C)  Resp:  20   Skin clean, dry and intact without evidence of skin break down, no evidence of skin tears noted. IV catheter discontinued with catheter tip intact. Site without signs and symptoms of complications - no  redness or edema noted at insertion site, patient denies c/o pain - only slight tenderness at site.  Dressing with slight pressure applied.  D/c Instructions-Education: Discharge instructions given to patient/spouse with verbalized understanding. D/c education completed with patient/spouse including follow up instructions, medication list, d/c activities limitations if indicated, with other d/c instructions as indicated by MD - patient able to verbalize understanding, all questions fully answered. Patient instructed to return to ED, call 911, or call MD for any changes in condition.   Patient and spouse waiting for oxygen tank and nebulizer machine to be delivered to hospital room. Pt instructed to call RN once medical equipment has been delivered to the room. Pt verbalized understanding. Pt resting in bed. Bed low and locked. Call bell within reach. Spouse at bedside at this time.   Dorita Fray, RN 05/04/2015 3:10 PM

## 2015-05-04 NOTE — Progress Notes (Addendum)
[facility] made writer aware that pt will not qualify for Oxygen unless he has a Chronic Respiratory Condition per (Medicare). Stephanie with [facility] will make pt aware and he can choose to have Oxygen delivered to home at his expense $ 300.00 for 3 days or 30 days. Wife of course concerned that she would pay out of pocket for 3 days and he will no longer need Oxygen.  Writer made MD aware. Will be available for any additional disposition needs.  14:36: Follow up phone call to wife to express our concern. Wife understands importance of follow up with PCP this week. Expressed that MD desires pt to have Oxygen and that it will be Out of pocket expense. Wife understands and is unable to afford this expense at this time.

## 2015-05-07 ENCOUNTER — Telehealth: Payer: Self-pay

## 2015-05-07 ENCOUNTER — Other Ambulatory Visit: Payer: Self-pay

## 2015-05-07 NOTE — Telephone Encounter (Signed)
Transition Care Management Follow-up Telephone Call  How have you been since you were released from the hospital? Doing ok but difficulty breathing at night    Do you understand why you were in the hospital? Yes    Do you understand the discharge instrcutions? No  Items Reviewed:  Medications reviewed: Yes   Allergies reviewed: yes  Dietary changes reviewed: yes  Referrals reviewed: yes   Functional Questionnaire:   Activities of Daily Living (ADLs):   He states they are independent in the following: ambulation, bathing and hygiene, feeding, continence, grooming, toileting and dressing States they require assistance with the following: None   Any transportation issues/concerns?: No   Any patient concerns? concern about his oxygen    Confirmed importance and date/time of follow-up visits scheduled: Yes    Confirmed with patient if condition begins to worsen call PCP or go to the ER.  Patient was given the Call-a-Nurse line [phone]:  Yes     Pt has a hospital fallow up with Dr Elease Hashimoto on Friday 05/10/15 at 2:00 pm

## 2015-05-10 ENCOUNTER — Ambulatory Visit (INDEPENDENT_AMBULATORY_CARE_PROVIDER_SITE_OTHER): Payer: Medicare Other | Admitting: Family Medicine

## 2015-05-10 VITALS — BP 122/72 | HR 71 | Temp 97.2°F | Wt 175.9 lb

## 2015-05-10 DIAGNOSIS — J9600 Acute respiratory failure, unspecified whether with hypoxia or hypercapnia: Secondary | ICD-10-CM

## 2015-05-10 DIAGNOSIS — J189 Pneumonia, unspecified organism: Secondary | ICD-10-CM | POA: Diagnosis not present

## 2015-05-10 DIAGNOSIS — I491 Atrial premature depolarization: Secondary | ICD-10-CM | POA: Diagnosis not present

## 2015-05-10 DIAGNOSIS — I10 Essential (primary) hypertension: Secondary | ICD-10-CM

## 2015-05-10 LAB — CBC WITH DIFFERENTIAL/PLATELET
Basophils Absolute: 0 10*3/uL (ref 0.0–0.1)
Basophils Relative: 0 % (ref 0.0–3.0)
Eosinophils Absolute: 0.6 10*3/uL (ref 0.0–0.7)
Eosinophils Relative: 3.9 % (ref 0.0–5.0)
HCT: 40.8 % (ref 39.0–52.0)
Hemoglobin: 13.6 g/dL (ref 13.0–17.0)
Lymphocytes Relative: 7.9 % — ABNORMAL LOW (ref 12.0–46.0)
Lymphs Abs: 1.2 10*3/uL (ref 0.7–4.0)
MCHC: 33.3 g/dL (ref 30.0–36.0)
MCV: 85.2 fl (ref 78.0–100.0)
Monocytes Absolute: 0.8 10*3/uL (ref 0.1–1.0)
Monocytes Relative: 5.4 % (ref 3.0–12.0)
Neutro Abs: 12.4 10*3/uL — ABNORMAL HIGH (ref 1.4–7.7)
Neutrophils Relative %: 82.8 % — ABNORMAL HIGH (ref 43.0–77.0)
Platelets: 427 10*3/uL — ABNORMAL HIGH (ref 150.0–400.0)
RBC: 4.79 Mil/uL (ref 4.22–5.81)
RDW: 14 % (ref 11.5–15.5)
WBC: 14.9 10*3/uL — ABNORMAL HIGH (ref 4.0–10.5)

## 2015-05-10 LAB — COMPREHENSIVE METABOLIC PANEL
ALT: 27 U/L (ref 0–53)
AST: 19 U/L (ref 0–37)
Albumin: 3 g/dL — ABNORMAL LOW (ref 3.5–5.2)
Alkaline Phosphatase: 74 U/L (ref 39–117)
BUN: 20 mg/dL (ref 6–23)
CO2: 24 mEq/L (ref 19–32)
Calcium: 8.9 mg/dL (ref 8.4–10.5)
Chloride: 106 mEq/L (ref 96–112)
Creatinine, Ser: 0.87 mg/dL (ref 0.40–1.50)
GFR: 89.18 mL/min (ref >60.00)
Glucose, Bld: 142 mg/dL — ABNORMAL HIGH (ref 70–99)
Potassium: 4.4 mEq/L (ref 3.5–5.1)
Sodium: 139 mEq/L (ref 135–145)
Total Bilirubin: 0.4 mg/dL (ref 0.2–1.2)
Total Protein: 5.3 g/dL — ABNORMAL LOW (ref 6.0–8.3)

## 2015-05-10 NOTE — Progress Notes (Signed)
Pre visit review using our clinic review tool, if applicable. No additional management support is needed unless otherwise documented below in the visit note. 

## 2015-05-10 NOTE — Progress Notes (Signed)
Subjective:    Patient ID: Eric George, male    DOB: 1930/11/27, 82 y.o.   MRN: [id number]  HPI   Follow-up from recent admission. Patient has history of hypertension, GERD, chronic cognitive changes, recurrent small bowel obstruction, history of traumatic brain injury who was admitted on December 25 with acute respiratory failure. He also had some preceding nausea and vomiting and abdominal pain. His wife thought he had recurrent small bowel obstruction but CT abdomen pelvis did not confirm this. There some question whether he had some ileus and he had elevated white count and chest x-ray showing left lower lobe pneumonia. He became hypoxic in the ER and was felt to have sepsis -though cultures were negative  Patient did improve with IV fluids and broad-spectrum antibiotics. He was discharged on Zithromax and Vantin. He has no chronic lung problems. He was discharged on home oxygen which he he is using just at night. Recommendation was for follow-up CBC, basic metabolic panel, chest x-ray.  Patient had frequent PVCs and addition was made of low-dose beta blocker with carvedilol 3.125 mg twice daily. There was initially some question of atrial fibrillation on monitor but apparently this was not confirmed.  Near baseline. O2 sat still occasionally drop down 89-91%. This occurs more at night. No chest pains. No dyspnea. Denies any recurrent vomiting or abdominal pain  Past Medical History  Diagnosis Date  . Bowel obstruction ([place])   . Hydrocephalus   . TBI (traumatic brain injury) ([place])   . 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 GERD (gastroesophageal reflux disease)   . Hypertension   . 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 Hyperlipidemia   . Stroke ([place])   . Short-term memory loss     due to TBI 1990  . CAP (community acquired pneumonia)    Past Surgical History  Procedure Laterality Date  . Hernia repair    . Prostatectomy    . Brain surgery    . Tee without cardioversion   03/22/2012    Procedure: TRANSESOPHAGEAL ECHOCARDIOGRAM (TEE);  Surgeon: [name], MD;  Location: [facility] ENDOSCOPY;  Service: Cardiovascular;  Laterality: N/A;    reports that he quit smoking about 48 years ago. His smoking use included Cigarettes. He has a 7.5 pack-year smoking history. He has never used smokeless tobacco. He reports that he does not drink alcohol or use illicit drugs. family history includes Heart disease in his sister. Allergies  Allergen Reactions  . Penicillins Rash      Review of Systems  Constitutional: Negative for fever and chills.  HENT: Negative for congestion.   Respiratory: Negative for cough and shortness of breath.   Cardiovascular: Negative for chest pain.  Gastrointestinal: Negative for nausea, vomiting and abdominal pain.  Genitourinary: Negative for dysuria.  Neurological: Negative for dizziness, syncope and weakness.  Psychiatric/Behavioral: Negative for agitation.       Objective:   Physical Exam  Constitutional: He appears well-developed and well-nourished.  HENT:  Mouth/Throat: Oropharynx is clear and moist.  Neck: Neck supple.  Cardiovascular: Normal rate and regular rhythm.   Occasional premature beat for for most part regular  Pulmonary/Chest: Effort normal and breath sounds normal. No respiratory distress. He has no wheezes. He has no rales.  Musculoskeletal: He exhibits no edema.  Lymphadenopathy:    He has no cervical adenopathy.  Neurological: He is alert.          Assessment & Plan:  #1 recent community acquired pneumonia-left lower lobe  with question of sepsis. Clinically improved. No fever since discharge.  Continue O2 at night for time being. Per wife, still occasionally dips down to 89-91% sat range.  Order for repeat chest x-ray next week. Repeat CBC and comprehensive metabolic panel  #2 history of frequent PACs and PVCs. Continue low-dose carvedilol.  Only rare premature beat on exam today.   #3 hypertension  stable and at goal  #4 history of recurrent small bowel obstruction. No recent bowel obstruction confirmed. Question of ileus. No residual symptoms.  Appetite improved.

## 2015-05-10 NOTE — Patient Instructions (Addendum)
Follow up for any fever, recurrent abdominal pain, or recurrent dyspnea Go next week for CXR at [facility] site (basement). If O2 sats consistently > 92% in 2 weeks can start to leave off the night time oxygen.

## 2015-05-11 ENCOUNTER — Other Ambulatory Visit: Payer: Self-pay | Admitting: Family Medicine

## 2015-05-11 DIAGNOSIS — I491 Atrial premature depolarization: Secondary | ICD-10-CM | POA: Insufficient documentation

## 2015-05-13 ENCOUNTER — Ambulatory Visit (INDEPENDENT_AMBULATORY_CARE_PROVIDER_SITE_OTHER)
Admission: RE | Admit: 2015-05-13 | Discharge: 2015-05-13 | Disposition: A | Discharge status: Home / Self Care | Payer: Medicare Other | Source: Ambulatory Visit | Attending: Family Medicine | Admitting: Family Medicine

## 2015-05-13 DIAGNOSIS — J189 Pneumonia, unspecified organism: Secondary | ICD-10-CM | POA: Diagnosis not present

## 2015-05-14 ENCOUNTER — Other Ambulatory Visit: Payer: Self-pay | Admitting: Family Medicine

## 2015-05-14 ENCOUNTER — Telehealth: Payer: Self-pay | Admitting: Family Medicine

## 2015-05-14 ENCOUNTER — Encounter: Payer: Self-pay | Admitting: Family Medicine

## 2015-05-14 MED ORDER — LEVOFLOXACIN 500 MG PO TABS: 500.0000 mg | ORAL_TABLET | Freq: Every day | ORAL | Status: DC

## 2015-05-14 NOTE — Telephone Encounter (Signed)
PLEASE NOTE: All timestamps contained within this report are represented as [place] Standard Time. CONFIDENTIALTY NOTICE: This fax transmission is intended only for the addressee. It contains information that is legally privileged, confidential or otherwise protected from use or disclosure. If you are not the intended recipient, you are strictly prohibited from reviewing, disclosing, copying using or disseminating any of this information or taking any action in reliance on or regarding this information. If you have received this fax in error, please notify us immediately by telephone so that we can arrange for its return to [place]. Phone: [phone], Toll-Free: [phone], Fax: [phone] Page: 1 of 2 Call Id: [id number] [facility] [place] Patient Name: Eric George DOB: 1930-08-18 Initial Comment Caller states her husband was discharged from hospital at first of month, had pneumonia, woke with nausea, dizziness, and feels faint Nurse Assessment Nurse: Marcelline Deist, RN, Kermit Balo Date/Time (Eastern Time): 05/14/2015 11:55:52 AM Confirm and document reason for call. If symptomatic, describe symptoms. ---Caller states her husband was discharged from hospital at first of month, had pneumonia, woke with nausea, dizziness, and feels faint. Was in hospital Christmas Day with small bowel obstruction, then had aspiration pneumonia. Had an appt. on Friday. Put on Carvedilol 3.125 mg twice a day for PAC's & PVC's or BP. Sunday am passed out after showering, etc. Put him on O2, his BP was 118/55, then 79/45 BP, O2 sats were in mid 90's. Got him in bed. Office called yesterday, told her his WBC's were 14.9, had a chest x ray done yesterday. Was told to hold BP rx if < 130/88. His BP has been 140's-150's/80's. This am, was pale, BP 120's/70's. Has the patient traveled out of the country within the last 30 days? ---Not  Applicable Does the patient have any new or worsening symptoms? ---Yes Will a triage be completed? ---Yes Related visit to physician within the last 2 weeks? ---Yes Does the PT have any chronic conditions? (i.e. diabetes, asthma, etc.) ---Yes List chronic conditions. ---BP rx, on O2 at night, PVC's PAC's Is this a behavioral health or substance abuse call? ---No Guidelines Guideline Title Affirmed Question Affirmed Notes Dizziness - Lightheadedness [1] MODERATE dizziness (e.g., interferes with normal activities) AND [2] has NOT been evaluated by physician for this (Exception: dizziness caused by heat exposure, sudden standing, or poor fluid intake) Final Disposition User See Physician within [place], RN, Kermit Balo Comments Caller also mentioned that on Friday he was having some difficulty with urination, which has now resolved. Caller would like a call with chest x ray results as well as what direction to go in now. He is back in bed as he got very dizzy & pale after standing up & caller states he does not look like he is feeling well.

## 2015-05-14 NOTE — Telephone Encounter (Signed)
Pt is scheduled tomorrow 1/11 at 3:45.

## 2015-05-14 NOTE — Telephone Encounter (Signed)
I would go ahead and get him back in for f/u next two days.  See note re:CXR.  Would start Levaquin 500 mg po qd for 7 days.

## 2015-05-15 ENCOUNTER — Encounter: Payer: Self-pay | Admitting: Family Medicine

## 2015-05-15 ENCOUNTER — Ambulatory Visit (INDEPENDENT_AMBULATORY_CARE_PROVIDER_SITE_OTHER): Payer: Medicare Other | Admitting: Family Medicine

## 2015-05-15 VITALS — BP 130/80 | HR 84 | Temp 97.3°F | Ht 75.0 in | Wt 174.1 lb

## 2015-05-15 DIAGNOSIS — J189 Pneumonia, unspecified organism: Secondary | ICD-10-CM

## 2015-05-15 DIAGNOSIS — R55 Syncope and collapse: Secondary | ICD-10-CM | POA: Diagnosis not present

## 2015-05-15 DIAGNOSIS — D72829 Elevated white blood cell count, unspecified: Secondary | ICD-10-CM | POA: Diagnosis not present

## 2015-05-15 LAB — CBC WITH DIFFERENTIAL/PLATELET
Basophils Absolute: 0 10*3/uL (ref 0.0–0.1)
Basophils Relative: 0.3 % (ref 0.0–3.0)
Eosinophils Absolute: 0.4 10*3/uL (ref 0.0–0.7)
Eosinophils Relative: 4.5 % (ref 0.0–5.0)
HCT: 42.5 % (ref 39.0–52.0)
Hemoglobin: 13.8 g/dL (ref 13.0–17.0)
Lymphocytes Relative: 15.9 % (ref 12.0–46.0)
Lymphs Abs: 1.4 10*3/uL (ref 0.7–4.0)
MCHC: 32.5 g/dL (ref 30.0–36.0)
MCV: 86.2 fl (ref 78.0–100.0)
Monocytes Absolute: 0.9 10*3/uL (ref 0.1–1.0)
Monocytes Relative: 10.3 % (ref 3.0–12.0)
Neutro Abs: 6.1 10*3/uL (ref 1.4–7.7)
Neutrophils Relative %: 69 % (ref 43.0–77.0)
Platelets: 517 10*3/uL — ABNORMAL HIGH (ref 150.0–400.0)
RBC: 4.93 Mil/uL (ref 4.22–5.81)
RDW: 13.5 % (ref 11.5–15.5)
WBC: 8.9 10*3/uL (ref 4.0–10.5)

## 2015-05-15 NOTE — Patient Instructions (Signed)
Take the Avapro at night Stay well hydrated. Finish out the [place].   Touch base for any fever or any recurrent syncope.

## 2015-05-15 NOTE — Progress Notes (Signed)
Subjective:    Patient ID: Eric George, male    DOB: December 04, 1930, 82 y.o.   MRN: [id number]  HPI   Patient recently seen for hospital follow-up Community acquired left lower lobe pneumonia He was treated with Zithromax and then post-hospitalization white count had increased from 13 K to 14,900. He has not had any fever. This past Sunday he got out of the shower felt slightly nauseous and had brief syncopal episode. Wife did not call EMS. He has not had any recurrent fever. Only minimal cough. Blood pressures vary considerably. That morning after he passed out she obtained blood pressure of [id number] systolic which promptly came back up. His oxygen saturations have been consistently good.  He is currently taking Avapro 150 mg once daily and carvedilol 3.125 mg twice a day. He has had good fluid intake. Feels well today. No orthostatic symptoms with standing or position change Wife has been leaving off oxygen for the most part  Past Medical History  Diagnosis Date  . Bowel obstruction ([place])   . Hydrocephalus   . TBI (traumatic brain injury) ([place])   . 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 GERD (gastroesophageal reflux disease)   . Hypertension   . 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 Hyperlipidemia   . Stroke ([place])   . Short-term memory loss     due to TBI 1990  . CAP (community acquired pneumonia)    Past Surgical History  Procedure Laterality Date  . Hernia repair    . Prostatectomy    . Brain surgery    . Tee without cardioversion  03/22/2012    Procedure: TRANSESOPHAGEAL ECHOCARDIOGRAM (TEE);  Surgeon: [name], MD;  Location: [facility] ENDOSCOPY;  Service: Cardiovascular;  Laterality: N/A;    reports that he quit smoking about 48 years ago. His smoking use included Cigarettes. He has a 7.5 pack-year smoking history. He has never used smokeless tobacco. He reports that he does not drink alcohol or use illicit drugs. family history includes Heart disease in his  sister. Allergies  Allergen Reactions  . Penicillins Rash      Review of Systems  Constitutional: Negative for fever, chills and appetite change.  Respiratory: Positive for cough. Negative for shortness of breath and wheezing.   Cardiovascular: Negative for chest pain, palpitations and leg swelling.  Gastrointestinal: Negative for vomiting, abdominal pain and diarrhea.  Endocrine: Negative for polydipsia and polyuria.  Genitourinary: Negative for dysuria.  Neurological: Negative for seizures and headaches.  Psychiatric/Behavioral: Negative for confusion.       Objective:   Physical Exam  Constitutional: He is oriented to person, place, and time. He appears well-developed and well-nourished. No distress.  HENT:  Mouth/Throat: Oropharynx is clear and moist.  Neck: Neck supple. No thyromegaly present.  Cardiovascular: Normal rate and regular rhythm.   Pulmonary/Chest: Effort normal and breath sounds normal. No respiratory distress. He has no wheezes. He has no rales.  Musculoskeletal: He exhibits no edema.  Neurological: He is alert and oriented to person, place, and time. No cranial nerve deficit.  Psychiatric: He has a normal mood and affect. His behavior is normal.          Assessment & Plan:  Recent community acquired pneumonia. Patient was treated with full course of antibiotics but had recent elevated white count following discharge with repeat chest x-ray showing concern for possible mild bilateral upper airspace disease. We started back on Levaquin. He's not had any fever or other concerning symptoms  and O2 sats have been good  Recent syncopal episode. Probably vasovagal. No recurrent episodes since then. Continue close blood pressure monitoring. Consider discontinue carvedilol if any recurrent syncope

## 2015-05-15 NOTE — Progress Notes (Signed)
Pre visit review using our clinic review tool, if applicable. No additional management support is needed unless otherwise documented below in the visit note. 

## 2015-05-20 ENCOUNTER — Telehealth: Payer: Self-pay | Admitting: Family Medicine

## 2015-05-20 NOTE — Telephone Encounter (Signed)
Patient Name: Eric George DOB: Oct 01, 1930 Initial Comment caller states husband may have had a reaction to Levaquin Nurse Assessment Nurse: Lavera Guise, RN, Vaughan Basta Date/Time (Eastern Time): 05/20/2015 11:42:06 AM Confirm and document reason for call. If symptomatic, describe symptoms. You must click the next button to save text entered. ---Caller states last night her husband couldn't get comfortable. Tingling in feet, Wanted soles flat down on bed. Put pillows under knees to get him comfortable. Finally turned on side and went to sleep. Has short term memory issues from a head injury. Admission to hospital with aspiration PNA on 12/25 and possible bowel obstruction. Did not have bowel obstruction, but does have a hx of small bowel obstruction. Has O2 on at night, above 94% -97% at bedtime and during day. Could we start weaning down. Are these side effects of Levaquin that doctor put him on on 1/10? Took last levaquin is today. Does not have follow up appt. Has the patient traveled out of the country within the last 30 days? ---No Does the patient have any new or worsening symptoms? ---Yes Will a triage be completed? ---Yes Related visit to physician within the last 2 weeks? ---Yes Does the PT have any chronic conditions? (i.e. diabetes, asthma, etc.) ---No Is this a behavioral health or substance abuse call? ---No Guidelines Guideline Title Affirmed Question Affirmed Notes Neurologic Deficit [1] Tingling in foot (e.g., pins and needles) AND [2] after prolonged sitting AND [3] brief (now gone) Final Disposition User [place], RN, Vaughan Basta Disagree/Comply: Comply

## 2015-05-20 NOTE — Telephone Encounter (Signed)
Patient's wife notified of Dr. Erick Blinks comments - verbalized understanding.

## 2015-05-20 NOTE — Telephone Encounter (Signed)
Possible side effect, but if symptoms very transient not as likely.   IF he has any tendon soreness or peripheral numbness be in touch.  O/W would stay on the Levaquin.

## 2015-05-20 NOTE — Telephone Encounter (Signed)
Please advise 

## 2015-05-23 ENCOUNTER — Encounter: Payer: Self-pay | Admitting: Family Medicine

## 2015-05-27 ENCOUNTER — Ambulatory Visit: Payer: Medicare Other | Admitting: Family Medicine

## 2015-05-27 ENCOUNTER — Telehealth: Payer: Self-pay | Admitting: Family Medicine

## 2015-05-27 DIAGNOSIS — J9600 Acute respiratory failure, unspecified whether with hypoxia or hypercapnia: Secondary | ICD-10-CM

## 2015-05-27 NOTE — Telephone Encounter (Signed)
Wife call to say [facility] need a fax order to d/c the oxgyen. Wife is asking if Dr Elease Hashimoto will need to see her husband before the d/c is done.     Fax number to [facility] ; [phone]   They need to received the fax by Friday in order to pick up equipment on 06/03/15

## 2015-05-27 NOTE — Telephone Encounter (Signed)
Okay for [facility] to Discontinue oxygen for patient or should they come in to discuss?

## 2015-05-27 NOTE — Telephone Encounter (Signed)
OK to discontinue

## 2015-05-28 ENCOUNTER — Other Ambulatory Visit: Payer: Self-pay | Admitting: Family Medicine

## 2015-05-28 ENCOUNTER — Encounter: Payer: Self-pay | Admitting: Family Medicine

## 2015-05-28 MED ORDER — CARVEDILOL 3.125 MG PO TABS: 3.1250 mg | ORAL_TABLET | Freq: Two times a day (BID) | ORAL | Status: DC

## 2015-05-28 NOTE — Telephone Encounter (Signed)
Oxygen order has been faxed to [facility]. Please advise if Carvedilol will continue at BID?

## 2015-05-28 NOTE — Telephone Encounter (Signed)
Recommendations on Chest xray?

## 2015-05-28 NOTE — Telephone Encounter (Signed)
Order has been faxed to [facility].

## 2015-05-29 ENCOUNTER — Other Ambulatory Visit: Payer: Self-pay | Admitting: Family Medicine

## 2015-05-29 DIAGNOSIS — J189 Pneumonia, unspecified organism: Secondary | ICD-10-CM

## 2015-05-29 NOTE — Telephone Encounter (Signed)
Verbally spoke with pts wife over the phone. Order faxed to [facility] and Medication sent to pharmacy.

## 2015-06-07 ENCOUNTER — Ambulatory Visit: Payer: Medicare Other | Admitting: Family Medicine

## 2015-06-12 ENCOUNTER — Ambulatory Visit (INDEPENDENT_AMBULATORY_CARE_PROVIDER_SITE_OTHER)
Admission: RE | Admit: 2015-06-12 | Discharge: 2015-06-12 | Disposition: A | Discharge status: Home / Self Care | Payer: Medicare Other | Source: Ambulatory Visit | Attending: Family Medicine | Admitting: Family Medicine

## 2015-06-12 DIAGNOSIS — J181 Lobar pneumonia, unspecified organism: Secondary | ICD-10-CM | POA: Diagnosis not present

## 2015-06-12 DIAGNOSIS — J189 Pneumonia, unspecified organism: Secondary | ICD-10-CM

## 2015-06-22 ENCOUNTER — Other Ambulatory Visit: Payer: Self-pay | Admitting: Family Medicine

## 2015-06-26 DIAGNOSIS — H25013 Cortical age-related cataract, bilateral: Secondary | ICD-10-CM | POA: Diagnosis not present

## 2015-06-26 DIAGNOSIS — H40013 Open angle with borderline findings, low risk, bilateral: Secondary | ICD-10-CM | POA: Diagnosis not present

## 2015-06-26 DIAGNOSIS — H25043 Posterior subcapsular polar age-related cataract, bilateral: Secondary | ICD-10-CM | POA: Diagnosis not present

## 2015-06-26 DIAGNOSIS — H353112 Nonexudative age-related macular degeneration, right eye, intermediate dry stage: Secondary | ICD-10-CM | POA: Diagnosis not present

## 2015-06-26 DIAGNOSIS — H35031 Hypertensive retinopathy, right eye: Secondary | ICD-10-CM | POA: Diagnosis not present

## 2015-06-26 DIAGNOSIS — H353122 Nonexudative age-related macular degeneration, left eye, intermediate dry stage: Secondary | ICD-10-CM | POA: Diagnosis not present

## 2015-06-26 DIAGNOSIS — H2513 Age-related nuclear cataract, bilateral: Secondary | ICD-10-CM | POA: Diagnosis not present

## 2015-06-26 LAB — HM DIABETES EYE EXAM

## 2015-07-02 ENCOUNTER — Encounter: Payer: Self-pay | Admitting: Family Medicine

## 2015-07-18 ENCOUNTER — Ambulatory Visit (INDEPENDENT_AMBULATORY_CARE_PROVIDER_SITE_OTHER): Payer: Medicare Other | Admitting: Family Medicine

## 2015-07-18 ENCOUNTER — Encounter: Payer: Self-pay | Admitting: Family Medicine

## 2015-07-18 VITALS — BP 130/82 | HR 82 | Temp 97.7°F | Ht 75.0 in | Wt 176.9 lb

## 2015-07-18 DIAGNOSIS — K219 Gastro-esophageal reflux disease without esophagitis: Secondary | ICD-10-CM

## 2015-07-18 DIAGNOSIS — L989 Disorder of the skin and subcutaneous tissue, unspecified: Secondary | ICD-10-CM

## 2015-07-18 DIAGNOSIS — I10 Essential (primary) hypertension: Secondary | ICD-10-CM | POA: Diagnosis not present

## 2015-07-18 NOTE — Progress Notes (Signed)
Subjective:    Patient ID: Eric George, male    DOB: [birth year], 82 y.o.   MRN: [id number]  HPI  patient seen for the following  Follow-up hypertension. He is currently on Avapro and low-dose carvedilol. According to wife, his blood pressures vary considerably up and down. Mostly controlled early mornings but frequentlyup some late in the day. No clear precipitating factors. No further syncope since last visit.   Recent pneumonia. No cough. No fever. Follow-up chest x-ray showed resolution of previous infiltrates.   Right nasal skin lesion noted about 3 months ago. Occasionally bleeds. No prior history of known skin cancer.   History of GERD which has been controlled with Zegerid. No recent dysphagia. Wife is concerned about weight loss though in looking back over our records he's actually lost very little over the past year and a half and only 2 pounds down from December.  Past Medical History  Diagnosis Date  . Bowel obstruction ([place])   . Hydrocephalus   . TBI (traumatic brain injury) ([place])   . 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 GERD (gastroesophageal reflux disease)   . Hypertension   . 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 Hyperlipidemia   . Stroke ([place])   . Short-term memory loss     due to TBI 1990  . CAP (community acquired pneumonia)    Past Surgical History  Procedure Laterality Date  . Hernia repair    . Prostatectomy    . Brain surgery    . Tee without cardioversion  03/22/2012    Procedure: TRANSESOPHAGEAL ECHOCARDIOGRAM (TEE);  Surgeon: [name], MD;  Location: [facility] ENDOSCOPY;  Service: Cardiovascular;  Laterality: N/A;    reports that he quit smoking about 48 years ago. His smoking use included Cigarettes. He has a 7.5 pack-year smoking history. He has never used smokeless tobacco. He reports that he does not drink alcohol or use illicit drugs. family history includes Heart disease in his sister. Allergies  Allergen Reactions  . Penicillins Rash       Review of Systems  Constitutional: Negative for chills, appetite change and fatigue.  Eyes: Negative for visual disturbance.  Respiratory: Negative for cough, chest tightness and shortness of breath.   Cardiovascular: Negative for chest pain, palpitations and leg swelling.  Gastrointestinal: Negative for abdominal pain.  Genitourinary: Negative for dysuria.  Neurological: Negative for dizziness, syncope, weakness, light-headedness and headaches.       Objective:   Physical Exam  Constitutional: He is oriented to person, place, and time. He appears well-developed and well-nourished.  HENT:  Right Ear: External ear normal.  Left Ear: External ear normal.  Mouth/Throat: Oropharynx is clear and moist.  Eyes: Pupils are equal, round, and reactive to light.  Neck: Neck supple. No thyromegaly present.  Cardiovascular: Normal rate and regular rhythm.   Pulmonary/Chest: Effort normal and breath sounds normal. No respiratory distress. He has no wheezes. He has no rales.  Musculoskeletal: He exhibits no edema.  Neurological: He is alert and oriented to person, place, and time.  Skin:  Right distal nose approximately 6-7 mm slightly ulcerative and slightly crusted skin lesion. This is slightly raised          Assessment & Plan:   #1 hypertension. Controlled by today's reading. Intermittent elevations at home. Continue close monitoring. No change of medication recommended   #2 right nasal skin lesion. Differential would include irritated seborrheic keratosis versus squamous cell versus less likely basal cell. Because of  location referral to [place],   #3 GERD stable and controlled. Continue Zegerid.

## 2015-07-18 NOTE — Progress Notes (Signed)
Pre visit review using our clinic review tool, if applicable. No additional management support is needed unless otherwise documented below in the visit note. 

## 2015-07-31 DIAGNOSIS — H6063 Unspecified chronic otitis externa, bilateral: Secondary | ICD-10-CM | POA: Diagnosis not present

## 2015-07-31 DIAGNOSIS — Z8673 Personal history of transient ischemic attack (TIA), and cerebral infarction without residual deficits: Secondary | ICD-10-CM | POA: Diagnosis not present

## 2015-07-31 DIAGNOSIS — Z974 Presence of external hearing-aid: Secondary | ICD-10-CM | POA: Diagnosis not present

## 2015-07-31 DIAGNOSIS — H903 Sensorineural hearing loss, bilateral: Secondary | ICD-10-CM | POA: Diagnosis not present

## 2015-08-09 DIAGNOSIS — H579 Unspecified disorder of eye and adnexa: Secondary | ICD-10-CM | POA: Diagnosis not present

## 2015-08-09 DIAGNOSIS — H04123 Dry eye syndrome of bilateral lacrimal glands: Secondary | ICD-10-CM | POA: Diagnosis not present

## 2015-08-22 DIAGNOSIS — Z974 Presence of external hearing-aid: Secondary | ICD-10-CM | POA: Diagnosis not present

## 2015-08-22 DIAGNOSIS — Z8673 Personal history of transient ischemic attack (TIA), and cerebral infarction without residual deficits: Secondary | ICD-10-CM | POA: Diagnosis not present

## 2015-08-22 DIAGNOSIS — B369 Superficial mycosis, unspecified: Secondary | ICD-10-CM | POA: Diagnosis not present

## 2015-08-22 DIAGNOSIS — H6243 Otitis externa in other diseases classified elsewhere, bilateral: Secondary | ICD-10-CM | POA: Diagnosis not present

## 2015-08-22 DIAGNOSIS — H903 Sensorineural hearing loss, bilateral: Secondary | ICD-10-CM | POA: Diagnosis not present

## 2015-08-23 DIAGNOSIS — L739 Follicular disorder, unspecified: Secondary | ICD-10-CM | POA: Diagnosis not present

## 2015-08-23 DIAGNOSIS — D485 Neoplasm of uncertain behavior of skin: Secondary | ICD-10-CM | POA: Diagnosis not present

## 2015-08-23 DIAGNOSIS — D1801 Hemangioma of skin and subcutaneous tissue: Secondary | ICD-10-CM | POA: Diagnosis not present

## 2015-08-23 DIAGNOSIS — L821 Other seborrheic keratosis: Secondary | ICD-10-CM | POA: Diagnosis not present

## 2015-08-23 DIAGNOSIS — D0462 Carcinoma in situ of skin of left upper limb, including shoulder: Secondary | ICD-10-CM | POA: Diagnosis not present

## 2015-08-28 DIAGNOSIS — R4182 Altered mental status, unspecified: Secondary | ICD-10-CM | POA: Diagnosis not present

## 2015-08-28 DIAGNOSIS — R42 Dizziness and giddiness: Secondary | ICD-10-CM | POA: Diagnosis not present

## 2015-08-30 ENCOUNTER — Ambulatory Visit (INDEPENDENT_AMBULATORY_CARE_PROVIDER_SITE_OTHER): Payer: Medicare Other | Admitting: Family Medicine

## 2015-08-30 ENCOUNTER — Encounter: Payer: Self-pay | Admitting: Family Medicine

## 2015-08-30 VITALS — BP 180/104 | HR 85 | Temp 97.6°F | Ht 75.0 in | Wt 179.0 lb

## 2015-08-30 DIAGNOSIS — I493 Ventricular premature depolarization: Secondary | ICD-10-CM

## 2015-08-30 DIAGNOSIS — R002 Palpitations: Secondary | ICD-10-CM | POA: Diagnosis not present

## 2015-08-30 DIAGNOSIS — R42 Dizziness and giddiness: Secondary | ICD-10-CM | POA: Diagnosis not present

## 2015-08-30 DIAGNOSIS — I10 Essential (primary) hypertension: Secondary | ICD-10-CM

## 2015-08-30 NOTE — Progress Notes (Signed)
Subjective:    Patient ID: Eric George, male    DOB: Jul 15, 1930, 82 y.o.   MRN: [id number]  HPI  Patient is seen  In follow-up from recent visit to emergency department [facility] 2 days ago. He was seen there with dizziness of relatively acute onset Wednesday night. He was diagnosed with "vertigo ".  We do not have any records for review at this time.    Patient is a poor historian. He has some dementia which makes history difficult. Wife relates Wednesday night he developed fairly acute dizziness which was described as "lightheadedness". Some nausea without vomiting. No chest pains. No dyspnea. No focal weakness. No speech changes. He seemed to be "off balance "with walking. He's taken the ER. She states that lab work was done with "normal" results. No x-rays were done. Patient was given 1 dose of Valium in the ER and prescribed meclizine and has only taken one dose. He has had some improvement in symptoms since discharge. It is not clear and discussing with patient and his wife whether these are clear-cut vertigo symptoms. No clear triggers. No recent syncope. Cognitive status unchanged  Chronic problems include history of CVA, history of recurrent small bowel obstruction , history of prostate cancer, history of frequent PACs and PVCs, macular degeneration, hyperlipidemia , hypertension , squamous cell skin cancer He does not consume caffeine. No alcohol use.   Wife has been monitoring blood pressure and this has varied considerably over the past several days-low of 98/58 to high around 161/70.   Good appetite and fluid intake. He currently takes low-dose carvedilol as well as Avapro. Also takes sertraline, aspirin, and Lipitor.  Past Medical History  Diagnosis Date  . Bowel obstruction ([place])   . Hydrocephalus   . TBI (traumatic brain injury) ([place])   . 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 GERD (gastroesophageal reflux disease)   . Hypertension   . CARCINOMA, SKIN, SQUAMOUS CELL 10/28/2009    . Hyperlipidemia   . Stroke ([place])   . Short-term memory loss     due to TBI 1990  . CAP (community acquired pneumonia)    Past Surgical History  Procedure Laterality Date  . Hernia repair    . Prostatectomy    . Brain surgery    . Tee without cardioversion  03/22/2012    Procedure: TRANSESOPHAGEAL ECHOCARDIOGRAM (TEE);  Surgeon: [name], MD;  Location: [facility] ENDOSCOPY;  Service: Cardiovascular;  Laterality: N/A;    reports that he quit smoking about 48 years ago. His smoking use included Cigarettes. He has a 7.5 pack-year smoking history. He has never used smokeless tobacco. He reports that he does not drink alcohol or use illicit drugs. family history includes Heart disease in his sister. Allergies  Allergen Reactions  . Penicillins Rash      Review of Systems  Constitutional: Negative for fever, chills and fatigue.  Eyes: Negative for visual disturbance.  Respiratory: Negative for cough, chest tightness and shortness of breath.   Cardiovascular: Negative for chest pain, palpitations and leg swelling.  Gastrointestinal: Negative for abdominal pain.  Neurological: Positive for dizziness and light-headedness. Negative for seizures, syncope, weakness and headaches.  Psychiatric/Behavioral: The patient is not nervous/anxious.        Objective:   Physical Exam  Constitutional: He is oriented to person, place, and time. He appears well-developed and well-nourished.  Neck:  No carotid bruits  Cardiovascular:  Irregular rhythm. Rate varies during exam but generally around 70-80  Pulmonary/Chest:  Effort normal and breath sounds normal. No respiratory distress. He has no wheezes. He has no rales.  Musculoskeletal: He exhibits no edema.  Neurological: He is alert and oriented to person, place, and time. No cranial nerve deficit.  No focal weakness. Cerebellar function normal. Gait normal  Psychiatric: He has a normal mood and affect. His behavior is normal.           Assessment & Plan:   Dizziness. He was recently diagnosed as "vertigo ". He denies any clear-cut vertigo symptoms at this time. Patient had very irregular rhythm on exam today. EKG shows frequent PVCs with first-degree AV block. He had one run on rhythm strip of 3 consecutive PVCs. Recommend set up cardiology assessment with EP specialist and patient and wife agree. Avoid caffeine.  Hypertension- initial BP per nurse 180/104 and improved to 160/80 at follow up repeat by me.  Wife will continue to monitor at home.  I am reluctant to add additional meds at this time with systolic readings at home as recent as 98.

## 2015-08-30 NOTE — Patient Instructions (Signed)
Premature Ventricular Contraction A premature ventricular contraction is an irregularity in the normal heart rhythm. These contractions are extra heartbeats that occur too early in the normal sequence. In most cases, these contractions are harmless and do not require treatment. CAUSES Premature ventricular contractions may occur without a known cause. In healthy people, the extra contractions may be caused by:  Smoking.  Drinking alcohol.  Caffeine.  Certain medicines.  Some illegal drugs.  Stress. Sometimes, changes in chemicals in the blood (electrolytes) can also cause premature ventricular contractions. They can also occur in people with heart diseases that cause a decrease in blood flow to the heart. SIGNS AND SYMPTOMS Premature ventricular contractions often do not cause any symptoms. In some cases, you may have a feeling of your heart beating fast or skipping a beat (palpitations). DIAGNOSIS Your health care provider will take your medical history and do a physical exam. During the exam, the health care provider will check for irregular heartbeats. Various tests may be done to help diagnose premature ventricular contractions. These tests may include:  An ECG (electrocardiogram) to monitor the electrical activity of your heart.  Holter monitor testing. A Holter monitor is a portable device that can monitor the electrical activity of your heart over longer periods of time.  Stress tests to see how exercise affects your heart rhythm.  Echocardiogram. This test uses sound waves (ultrasound) to produce an image of your heart.  Electrophysiology study. This is used to evaluate the electrical conduction system of your heart. TREATMENT Usually, no treatment is needed. You may be advised to avoid things that can trigger the premature contractions, such as caffeine or alcohol. Medicines are sometimes given if symptoms are severe or if the extra heartbeats are very frequent. Treatment may  also be needed for an underlying cause of the contractions if one is found. HOME CARE INSTRUCTIONS  Take medicines only as directed by your health care provider.  Make any lifestyle changes recommended by your health care provider. These may include:  Quitting smoking.  Avoiding or limiting caffeine or alcohol.  Exercising. Talk to your health care provider about what type of exercise is safe for you.  Trying to reduce stress.  Keep all follow-up visits with your health care provider. This is important. SEEK IMMEDIATE MEDICAL CARE IF:  You feel palpitations that are frequent or continual.  You have chest pain.  You have shortness of breath.  You have sweating for no reason.  You have nausea and vomiting.  You become light-headed or faint.   This information is not intended to replace advice given to you by your health care provider. Make sure you discuss any questions you have with your health care provider.   Document Released: 12/06/2003 Document Revised: 05/11/2014 Document Reviewed: 09/21/2013 Elsevier Interactive Patient Education 2016 Elsevier Inc. Dizziness Dizziness is a common problem. It is a feeling of unsteadiness or light-headedness. You may feel like you are about to faint. Dizziness can lead to injury if you stumble or fall. Anyone can become dizzy, but dizziness is more common in older adults. This condition can be caused by a number of things, including medicines, dehydration, or illness. HOME CARE INSTRUCTIONS Taking these steps may help with your condition: Eating and Drinking  Drink enough fluid to keep your urine clear or pale yellow. This helps to keep you from becoming dehydrated. Try to drink more clear fluids, such as water.  Do not drink alcohol.  Limit your caffeine intake if directed by your health  care provider.  Limit your salt intake if directed by your health care provider. Activity  Avoid making quick movements.  Rise slowly from  chairs and steady yourself until you feel okay.  In the morning, first sit up on the side of the bed. When you feel okay, stand slowly while you hold onto something until you know that your balance is fine.  Move your legs often if you need to stand in one place for a long time. Tighten and relax your muscles in your legs while you are standing.  Do not drive or operate heavy machinery if you feel dizzy.  Avoid bending down if you feel dizzy. Place items in your home so that they are easy for you to reach without leaning over. Lifestyle  Do not use any tobacco products, including cigarettes, chewing tobacco, or electronic cigarettes. If you need help quitting, ask your health care provider.  Try to reduce your stress level, such as with yoga or meditation. Talk with your health care provider if you need help. General Instructions  Watch your dizziness for any changes.  Take medicines only as directed by your health care provider. Talk with your health care provider if you think that your dizziness is caused by a medicine that you are taking.  Tell a friend or a family member that you are feeling dizzy. If he or she notices any changes in your behavior, have this person call your health care provider.  Keep all follow-up visits as directed by your health care provider. This is important. SEEK MEDICAL CARE IF:  Your dizziness does not go away.  Your dizziness or light-headedness gets worse.  You feel nauseous.  You have reduced hearing.  You have new symptoms.  You are unsteady on your feet or you feel like the room is spinning. SEEK IMMEDIATE MEDICAL CARE IF:  You vomit or have diarrhea and are unable to eat or drink anything.  You have problems talking, walking, swallowing, or using your arms, hands, or legs.  You feel generally weak.  You are not thinking clearly or you have trouble forming sentences. It may take a friend or family member to notice this.  You have chest  pain, abdominal pain, shortness of breath, or sweating.  Your vision changes.  You notice any bleeding.  You have a headache.  You have neck pain or a stiff neck.  You have a fever.   This information is not intended to replace advice given to you by your health care provider. Make sure you discuss any questions you have with your health care provider.   Document Released: 10/14/2000 Document Revised: 09/04/2014 Document Reviewed: 04/16/2014 Elsevier Interactive Patient Education [employer].

## 2015-08-30 NOTE — Progress Notes (Signed)
Pre visit review using our clinic review tool, if applicable. No additional management support is needed unless otherwise documented below in the visit note. 

## 2015-09-03 DIAGNOSIS — D0462 Carcinoma in situ of skin of left upper limb, including shoulder: Secondary | ICD-10-CM | POA: Diagnosis not present

## 2015-09-05 DIAGNOSIS — Z974 Presence of external hearing-aid: Secondary | ICD-10-CM | POA: Diagnosis not present

## 2015-09-05 DIAGNOSIS — H903 Sensorineural hearing loss, bilateral: Secondary | ICD-10-CM | POA: Diagnosis not present

## 2015-09-05 DIAGNOSIS — H6063 Unspecified chronic otitis externa, bilateral: Secondary | ICD-10-CM | POA: Diagnosis not present

## 2015-09-11 ENCOUNTER — Encounter: Payer: Self-pay | Admitting: Family Medicine

## 2015-09-17 DIAGNOSIS — K449 Diaphragmatic hernia without obstruction or gangrene: Secondary | ICD-10-CM | POA: Diagnosis not present

## 2015-09-17 DIAGNOSIS — K219 Gastro-esophageal reflux disease without esophagitis: Secondary | ICD-10-CM | POA: Diagnosis not present

## 2015-09-17 DIAGNOSIS — K5730 Diverticulosis of large intestine without perforation or abscess without bleeding: Secondary | ICD-10-CM | POA: Diagnosis not present

## 2015-09-17 DIAGNOSIS — Z86010 Personal history of colonic polyps: Secondary | ICD-10-CM | POA: Diagnosis not present

## 2015-09-18 DIAGNOSIS — H903 Sensorineural hearing loss, bilateral: Secondary | ICD-10-CM | POA: Diagnosis not present

## 2015-09-18 DIAGNOSIS — H6243 Otitis externa in other diseases classified elsewhere, bilateral: Secondary | ICD-10-CM | POA: Diagnosis not present

## 2015-09-18 DIAGNOSIS — B369 Superficial mycosis, unspecified: Secondary | ICD-10-CM | POA: Diagnosis not present

## 2015-09-18 DIAGNOSIS — Z974 Presence of external hearing-aid: Secondary | ICD-10-CM | POA: Diagnosis not present

## 2015-09-22 ENCOUNTER — Other Ambulatory Visit: Payer: Self-pay | Admitting: Family Medicine

## 2015-10-01 ENCOUNTER — Ambulatory Visit (INDEPENDENT_AMBULATORY_CARE_PROVIDER_SITE_OTHER): Payer: Medicare Other | Admitting: Internal Medicine

## 2015-10-01 ENCOUNTER — Encounter: Payer: Self-pay | Admitting: Internal Medicine

## 2015-10-01 VITALS — BP 126/74 | HR 83 | Ht 74.0 in | Wt 180.2 lb

## 2015-10-01 DIAGNOSIS — I493 Ventricular premature depolarization: Secondary | ICD-10-CM | POA: Diagnosis not present
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
## 2015-10-01 NOTE — Patient Instructions (Signed)
Medication Instructions: - Your physician recommends that you continue on your current medications as directed. Please refer to the Current Medication list given to you today.  Labwork: - none  Procedures/Testing: - none  Follow-Up: - Dr. Klein will see you back on an as needed basis.  Any Additional Special Instructions Will Be Listed Below (If Applicable).     If you need a refill on your cardiac medications before your next appointment, please call your pharmacy.   

## 2015-10-16 DIAGNOSIS — H6063 Unspecified chronic otitis externa, bilateral: Secondary | ICD-10-CM | POA: Diagnosis not present

## 2015-10-16 DIAGNOSIS — H903 Sensorineural hearing loss, bilateral: Secondary | ICD-10-CM | POA: Diagnosis not present

## 2015-11-01 ENCOUNTER — Telehealth: Payer: Self-pay

## 2015-11-01 DIAGNOSIS — H6063 Unspecified chronic otitis externa, bilateral: Secondary | ICD-10-CM | POA: Diagnosis not present

## 2015-11-01 DIAGNOSIS — H903 Sensorineural hearing loss, bilateral: Secondary | ICD-10-CM | POA: Diagnosis not present

## 2015-11-01 NOTE — Telephone Encounter (Signed)
HOME CARE: You should be able to treat this at home. REASSURANCE: * The body can normally become dehydrated from sweating due to heat exposure and/or exercise. * Symptoms can include dizziness, weakness, nausea, fatigue, and headache. * You should feel better after lost fluids are replaced and you have been able to rest for 1 or 2 hours. MOVE TO A COOL SHADY AREA: * Move to a cool shady area. If possible, move into an air-conditioned place. * Remove excess clothing or equipment (e.g., sports gear, protective work uniforms). * Rest until feeling better. IF YOU BECOME DEHYDRATED -- DRINK LIQUIDS: * WATER: For mild to moderate dehyrdation, water is often the best liquid to drink. You should also eat some salty foods (e.g., potato chips, pretzels, saltine crackers). This is important to make sure you are getting enough salt, sugars, and fluids to meet your body's needs. * SPORTS DRINKS: Another option is to drink a sports drink (e.g., Gatorade, Powerade). For it to work best, mix it half and half with water. AVOID: * Don't take salt tablets. (Reason: they may cause vomiting) * Don't drink carbonated beverages (Reason: bubbles fill up stomach) * Don't drink alcohol or caffeinated beverages (Reason: they are dehydrating) LIE DOWN: Lie down with your feet elevated. CALL BACK IF: * You don't feel better in 2 hours * Fainting occurs * Vomiting interferes with taking fluids * You become worse. CARE ADVICE given per Heat Exposure (Adult) guideline.  *Home care advice given.  Anything else that needs to be done?    PLEASE NOTE: All timestamps contained within this report are represented as [place] Standard Time. CONFIDENTIALTY NOTICE: This fax transmission is intended only for the addressee. It contains information that is legally privileged, confidential or otherwise protected from use or disclosure. If you are not the intended recipient, you are strictly prohibited from reviewing, disclosing, copying using  or disseminating any of this information or taking any action in reliance on or regarding this information. If you have received this fax in error, please notify us immediately by telephone so that we can arrange for its return to [place]. Phone: [phone], Toll-Free: [phone], Fax: [phone] Page: 1 of 2 Call Id: [id number] [facility] Night - Client [place] Patient Name: Eric George Gender: Male DOB: [birth year] Age: 82 Y 11 M 26 D Return Phone Number: [phone] (Primary), [phone] (Secondary) Address: City/State/Zip: [place] [address] Client [facility] Night - Client Client Site [facility] - Night Physician Carolann Littler - MD Contact Type Call Who Is Calling Patient / Member / Family / Caregiver Call Type Triage / Clinical Caller Name [name] Relationship To Patient Spouse Return Phone Number [phone] (Primary) Chief Complaint Walking difficulty Reason for Call Symptomatic / Request for [place] said her husband went for his walk today and said he was unsteady on his feet. Started around 5pm but seems to getting worse as the night goes on. Blood pressure is now 189/91 61 171/66 59 pulse. Said his neck sounds like it is grating when he turns it. PreDisposition InappropriateToAsk Translation No Nurse Assessment Nurse: Zenia Resides, RN, Shirlee Limerick Date/Time Eilene Ghazi Time): 10/31/2015 11:03:21 PM Confirm and document reason for call. If symptomatic, describe symptoms. You must click the next button to save text entered. ---Caller said her husband went for his walk today and said he was unsteady on his feet. Started around 5pm but seems to getting worse as the night goes on.  Blood pressure is now 189/91 61 171/66 59 pulse. Said his neck sounds like it is grating when he turns it. Has the patient traveled out of the country within the  last 30 days? ---Not Applicable Does the patient have any new or worsening symptoms? ---Yes Will a triage be completed? ---Yes Related visit to physician within the last 2 weeks? ---No Does the PT have any chronic conditions? (i.e. diabetes, asthma, etc.) ---Yes List chronic conditions. ---PVC's, HTN, gastric reflux, high cholesterol, anxiety, hx of stroke 3 years ago, hx of small bowel obstructions, Is this a behavioral health or substance abuse call? ---No PLEASE NOTE: All timestamps contained within this report are represented as [place] Standard Time. CONFIDENTIALTY NOTICE: This fax transmission is intended only for the addressee. It contains information that is legally privileged, confidential or otherwise protected from use or disclosure. If you are not the intended recipient, you are strictly prohibited from reviewing, disclosing, copying using or disseminating any of this information or taking any action in reliance on or regarding this information. If you have received this fax in error, please notify us immediately by telephone so that we can arrange for its return to [place]. Phone: [phone], Toll-Free: [phone], Fax: [phone] Page: 2 of 2 Call Id: [id number] Guidelines Guideline Title Affirmed Question Affirmed Notes Nurse Date/Time Eilene Ghazi Time) Heat Exposure (Heat Exhaustion and Heat Stroke) Normal mild dehydration suspected (e.g., dizziness, weakness, nausea) from heat exposure Shea Evans 10/31/2015 11:09:37 PM Disp. Time Eilene Ghazi Time) Disposition Final User 10/31/2015 11:17:10 [place], RN, Abran Duke Understands: Yes Disagree/Comply: Comply Care Advice Given Per Guideline HOME CARE: You should be able to treat this at home. REASSURANCE: * The body can normally become dehydrated from sweating due to heat exposure and/or exercise. * Symptoms can include dizziness, weakness, nausea, fatigue, and headache. * You should feel better after  lost fluids are replaced and you have been able to rest for 1 or 2 hours. MOVE TO A COOL SHADY AREA: * Move to a cool shady area. If possible, move into an air-conditioned place. * Remove excess clothing or equipment (e.g., sports gear, protective work uniforms). * Rest until feeling better. IF YOU BECOME DEHYDRATED -- DRINK LIQUIDS: * WATER: For mild to moderate dehyrdation, water is often the best liquid to drink. You should also eat some salty foods (e.g., potato chips, pretzels, saltine crackers). This is important to make sure you are getting enough salt, sugars, and fluids to meet your body's needs. * SPORTS DRINKS: Another option is to drink a sports drink (e.g., Gatorade, Powerade). For it to work best, mix it half and half with water. AVOID: * Don't take salt tablets. (Reason: they may cause vomiting) * Don't drink carbonated beverages (Reason: bubbles fill up stomach) * Don't drink alcohol or caffeinated beverages (Reason: they are dehydrating) LIE DOWN: Lie down with your feet elevated. CALL BACK IF: * You don't feel better in 2 hours * Fainting occurs * Vomiting interferes with taking fluids * You become worse. CARE ADVICE given per Heat Exposure (Adult) guideline. Comments User: Lou Miner, RN Date/Time Eilene Ghazi Time): 10/31/2015 11:21:01 PM Last BP check is 168/94

## 2015-11-01 NOTE — Telephone Encounter (Signed)
Wife called to advise pt seems to better this am. Does not appear to be unsteady. Nothing hurts. Wife wants to make sure Dr Elease Hashimoto sees this note and does not want to see pt today,. Pt has made follow up appointment for next wed for the neck pain.

## 2015-11-06 ENCOUNTER — Ambulatory Visit (INDEPENDENT_AMBULATORY_CARE_PROVIDER_SITE_OTHER): Payer: Medicare Other | Admitting: Family Medicine

## 2015-11-06 ENCOUNTER — Encounter: Payer: Self-pay | Admitting: Family Medicine

## 2015-11-06 VITALS — BP 130/60 | HR 104 | Temp 97.7°F | Ht 74.0 in | Wt 182.0 lb

## 2015-11-06 DIAGNOSIS — M25561 Pain in right knee: Secondary | ICD-10-CM | POA: Diagnosis not present

## 2015-11-06 DIAGNOSIS — R29818 Other symptoms and signs involving the nervous system: Secondary | ICD-10-CM | POA: Diagnosis not present

## 2015-11-06 DIAGNOSIS — M47812 Spondylosis without myelopathy or radiculopathy, cervical region: Secondary | ICD-10-CM | POA: Diagnosis not present

## 2015-11-06 DIAGNOSIS — I10 Essential (primary) hypertension: Secondary | ICD-10-CM | POA: Diagnosis not present

## 2015-11-06 DIAGNOSIS — M25562 Pain in left knee: Secondary | ICD-10-CM

## 2015-11-06 DIAGNOSIS — R2689 Other abnormalities of gait and mobility: Secondary | ICD-10-CM

## 2015-11-06 NOTE — Progress Notes (Signed)
Pre visit review using our clinic review tool, if applicable. No additional management support is needed unless otherwise documented below in the visit note. 

## 2015-11-06 NOTE — Progress Notes (Signed)
Subjective:    Patient ID: Eric George, male    DOB: 1930/08/09, 82 y.o.   MRN: [id number]  HPI Patient seen for several issues as follows:  Recent "grinding sensation "in his lower neck with movement. He has no neck pain whatsoever. No radiculopathy symptoms. Symptoms are intermittent. Denies upper extremity weakness or numbness.  Also complains of some more general arthritis pains knees and hip. Still walks 2 miles per day.  Has not noted any visible swelling or redness. Occasionally takes Tylenol which helps.  Recent episode last week after walking of feeling "off-balance ". No focal weakness. No vertigo. Wife took his blood pressure which was quite elevated. This occurred after walking in the heat. After several hours his blood pressure came down. He's not had any problems with balance since then. No recent syncope. History of frequent PVCs. No recent chest pains.  Past Medical History  Diagnosis Date  . Bowel obstruction ([place])   . Hydrocephalus   . TBI (traumatic brain injury) ([place])   . 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 GERD (gastroesophageal reflux disease)   . Hypertension   . 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 Hyperlipidemia   . Stroke ([place])   . Short-term memory loss     due to TBI 1990  . CAP (community acquired pneumonia)    Past Surgical History  Procedure Laterality Date  . Hernia repair    . Prostatectomy    . Brain surgery    . Tee without cardioversion  03/22/2012    Procedure: TRANSESOPHAGEAL ECHOCARDIOGRAM (TEE);  Surgeon: [name], MD;  Location: [facility] ENDOSCOPY;  Service: Cardiovascular;  Laterality: N/A;    reports that he quit smoking about 49 years ago. His smoking use included Cigarettes. He has a 7.5 pack-year smoking history. He has never used smokeless tobacco. He reports that he does not drink alcohol or use illicit drugs. family history includes Heart disease in his sister. Allergies  Allergen Reactions  . Penicillins Rash       Review of Systems  Constitutional: Negative for fatigue.  Eyes: Negative for visual disturbance.  Respiratory: Negative for cough, chest tightness and shortness of breath.   Cardiovascular: Negative for chest pain, palpitations and leg swelling.  Endocrine: Negative for polydipsia and polyuria.  Genitourinary: Negative for dysuria.  Musculoskeletal: Positive for arthralgias. Negative for back pain, joint swelling and neck pain.  Neurological: Negative for dizziness, syncope, weakness, light-headedness and headaches.       Objective:   Physical Exam  Constitutional: He is oriented to person, place, and time. He appears well-developed and well-nourished.  Neck: Neck supple. No thyromegaly present.  Cardiovascular: Normal rate.   Occasional premature beats  Pulmonary/Chest: Effort normal and breath sounds normal. No respiratory distress. He has no wheezes. He has no rales.  Musculoskeletal: He exhibits no edema.  Lymphadenopathy:    He has no cervical adenopathy.  Neurological: He is alert and oriented to person, place, and time. No cranial nerve deficit.  No focal strength deficits. Gait assisted with cane but steady with transfers. Cerebellar function normal. Romberg normal          Assessment & Plan:  #1 neck stiffness. He probably has some degenerative arthritis. He describes some crepitus with movement of the neck and we explained this is very likely related to arthritis. He does not have any significant pain and no radiculopathy symptoms. Reassurance given  #2 intermittent knee and hip pain. Suspect degenerative arthritis. No evidence  for inflammatory arthritis. Start with regular Tylenol. Continue walking as tolerated  #3 hypertension stable and well controlled by today's readings  #4 recent episode of transient difficulties with feeling off balance. No episode since then. He did not have any focal weakness or speech changes or other suggestion of TIA. Observe for  now.  Eric Post MD [facility] at [facility]

## 2015-11-23 DIAGNOSIS — M6281 Muscle weakness (generalized): Secondary | ICD-10-CM | POA: Diagnosis not present

## 2015-11-23 DIAGNOSIS — I10 Essential (primary) hypertension: Secondary | ICD-10-CM | POA: Diagnosis not present

## 2015-11-23 DIAGNOSIS — R402362 Coma scale, best motor response, obeys commands, at arrival to emergency department: Secondary | ICD-10-CM | POA: Diagnosis not present

## 2015-11-23 DIAGNOSIS — R402252 Coma scale, best verbal response, oriented, at arrival to emergency department: Secondary | ICD-10-CM | POA: Diagnosis not present

## 2015-11-23 DIAGNOSIS — S7002XA Contusion of left hip, initial encounter: Secondary | ICD-10-CM | POA: Diagnosis not present

## 2015-11-23 DIAGNOSIS — S79812A Other specified injuries of left hip, initial encounter: Secondary | ICD-10-CM | POA: Diagnosis not present

## 2015-11-23 DIAGNOSIS — S1989XA Other specified injuries of other specified part of neck, initial encounter: Secondary | ICD-10-CM | POA: Diagnosis not present

## 2015-11-23 DIAGNOSIS — S8001XA Contusion of right knee, initial encounter: Secondary | ICD-10-CM | POA: Diagnosis not present

## 2015-11-23 DIAGNOSIS — R279 Unspecified lack of coordination: Secondary | ICD-10-CM | POA: Diagnosis not present

## 2015-11-23 DIAGNOSIS — S098XXA Other specified injuries of head, initial encounter: Secondary | ICD-10-CM | POA: Diagnosis not present

## 2015-11-23 DIAGNOSIS — S8982XA Other specified injuries of left lower leg, initial encounter: Secondary | ICD-10-CM | POA: Diagnosis not present

## 2015-11-23 DIAGNOSIS — S298XXA Other specified injuries of thorax, initial encounter: Secondary | ICD-10-CM | POA: Diagnosis not present

## 2015-11-23 DIAGNOSIS — M25562 Pain in left knee: Secondary | ICD-10-CM | POA: Diagnosis not present

## 2015-11-23 DIAGNOSIS — R402142 Coma scale, eyes open, spontaneous, at arrival to emergency department: Secondary | ICD-10-CM | POA: Diagnosis not present

## 2015-11-23 DIAGNOSIS — M25552 Pain in left hip: Secondary | ICD-10-CM | POA: Diagnosis not present

## 2015-11-23 DIAGNOSIS — M25522 Pain in left elbow: Secondary | ICD-10-CM | POA: Diagnosis not present

## 2015-11-23 DIAGNOSIS — M25521 Pain in right elbow: Secondary | ICD-10-CM | POA: Diagnosis not present

## 2015-11-23 DIAGNOSIS — M25559 Pain in unspecified hip: Secondary | ICD-10-CM | POA: Diagnosis not present

## 2015-11-23 DIAGNOSIS — S8002XA Contusion of left knee, initial encounter: Secondary | ICD-10-CM | POA: Diagnosis not present

## 2015-11-23 DIAGNOSIS — R52 Pain, unspecified: Secondary | ICD-10-CM | POA: Diagnosis not present

## 2015-11-23 DIAGNOSIS — M542 Cervicalgia: Secondary | ICD-10-CM | POA: Diagnosis not present

## 2015-12-02 DIAGNOSIS — H40013 Open angle with borderline findings, low risk, bilateral: Secondary | ICD-10-CM | POA: Diagnosis not present

## 2015-12-02 DIAGNOSIS — H04123 Dry eye syndrome of bilateral lacrimal glands: Secondary | ICD-10-CM | POA: Diagnosis not present

## 2016-01-07 ENCOUNTER — Encounter: Payer: Self-pay | Admitting: Family Medicine

## 2016-01-07 ENCOUNTER — Ambulatory Visit (INDEPENDENT_AMBULATORY_CARE_PROVIDER_SITE_OTHER): Payer: Medicare Other | Admitting: Family Medicine

## 2016-01-07 VITALS — BP 110/72 | HR 81 | Temp 97.6°F | Ht 74.0 in | Wt 182.0 lb

## 2016-01-07 DIAGNOSIS — I10 Essential (primary) hypertension: Secondary | ICD-10-CM | POA: Diagnosis not present

## 2016-01-07 DIAGNOSIS — I491 Atrial premature depolarization: Secondary | ICD-10-CM

## 2016-01-07 DIAGNOSIS — E785 Hyperlipidemia, unspecified: Secondary | ICD-10-CM | POA: Diagnosis not present

## 2016-01-07 DIAGNOSIS — Z9181 History of falling: Secondary | ICD-10-CM

## 2016-01-07 NOTE — Patient Instructions (Signed)
Fall Prevention in the Home  Falls can cause injuries and can affect people from all age groups. There are many simple things that you can do to make your home safe and to help prevent falls. WHAT CAN I DO ON THE OUTSIDE OF MY HOME?  Regularly repair the edges of walkways and driveways and fix any cracks.  Remove high doorway thresholds.  Trim any shrubbery on the main path into your home.  Use bright outdoor lighting.  Clear walkways of debris and clutter, including tools and rocks.  Regularly check that handrails are securely fastened and in good repair. Both sides of any steps should have handrails.  Install guardrails along the edges of any raised decks or porches.  Have leaves, snow, and ice cleared regularly.  Use sand or salt on walkways during winter months.  In the garage, clean up any spills right away, including grease or oil spills. WHAT CAN I DO IN THE BATHROOM?  Use night lights.  Install grab bars by the toilet and in the tub and shower. Do not use towel bars as grab bars.  Use non-skid mats or decals on the floor of the tub or shower.  If you need to sit down while you are in the shower, use a plastic, non-slip stool.Marland Kitchen  Keep the floor dry. Immediately clean up any water that spills on the floor.  Remove soap buildup in the tub or shower on a regular basis.  Attach bath mats securely with double-sided non-slip rug tape.  Remove throw rugs and other tripping hazards from the floor. WHAT CAN I DO IN THE BEDROOM?  Use night lights.  Make sure that a bedside light is easy to reach.  Do not use oversized bedding that drapes onto the floor.  Have a firm chair that has side arms to use for getting dressed.  Remove throw rugs and other tripping hazards from the floor. WHAT CAN I DO IN THE KITCHEN?   Clean up any spills right away.  Avoid walking on wet floors.  Place frequently used items in easy-to-reach places.  If you need to reach for  something above you, use a sturdy step stool that has a grab bar.  Keep electrical cables out of the way.  Do not use floor polish or wax that makes floors slippery. If you have to use wax, make sure that it is non-skid floor wax.  Remove throw rugs and other tripping hazards from the floor. WHAT CAN I DO IN THE STAIRWAYS?  Do not leave any items on the stairs.  Make sure that there are handrails on both sides of the stairs. Fix handrails that are broken or loose. Make sure that handrails are as long as the stairways.  Check any carpeting to make sure that it is firmly attached to the stairs. Fix any carpet that is loose or worn.  Avoid having throw rugs at the top or bottom of stairways, or secure the rugs with carpet tape to prevent them from moving.  Make sure that you have a light switch at the top of the stairs and the bottom of the stairs. If you do not have them, have them installed. WHAT ARE SOME OTHER FALL PREVENTION TIPS?  Wear closed-toe shoes that fit well and support your feet. Wear shoes that have rubber soles or low heels.  When you use a stepladder, make sure that it is completely opened and that the sides are firmly locked. Have someone hold the  ladder while you are using it. Do not climb a closed stepladder.  Add color or contrast paint or tape to grab bars and handrails in your home. Place contrasting color strips on the first and last steps.  Use mobility aids as needed, such as canes, walkers, scooters, and crutches.  Turn on lights if it is dark. Replace any light bulbs that burn out.  Set up furniture so that there are clear paths. Keep the furniture in the same spot.  Fix any uneven floor surfaces.  Choose a carpet design that does not hide the edge of steps of a stairway.  Be aware of any and all pets.  Review your medicines with your healthcare provider. Some medicines can cause dizziness or changes in blood pressure, which increase your risk of  falling. Talk with your health care provider about other ways that you can decrease your risk of falls. This may include working with a physical therapist or trainer to improve your strength, balance, and endurance.   This information is not intended to replace advice given to you by your health care provider. Make sure you discuss any questions you have with your health care provider.   Document Released: 04/10/2002 Document Revised: 09/04/2014 Document Reviewed: 05/25/2014 Elsevier Interactive Patient Education [employer].  Remember flu vaccine this Fall.

## 2016-01-07 NOTE — Progress Notes (Signed)
Pre visit review using our clinic review tool, if applicable. No additional management support is needed unless otherwise documented below in the visit note. 

## 2016-01-07 NOTE — Progress Notes (Signed)
Subjective:     Patient ID: Eric George, male   DOB: Apr 10, 1931, 82 y.o.   MRN: [id number]  HPI Patient seen for medical follow-up. Has history of hypertension, PACs, GERD, history of recurrent small bowel obstruction, history of CVA, hyperlipidemia, history depression, history of prostate cancer, macular degeneration.  He was with his wife down in [facility] July 22 and missed a step and fell. He had multiple x-rays reportedly including CT head and neck along with x-rays of the pelvis, elbow, hip, and knee with no acute abnormalities. No head injury reported. No loss of consciousness reported. His wife feels he is high risk for falls. Has not had any falls since July. There was no head injury. No history of syncope. General very sedentary.  Medications reviewed. Compliant with all. Blood pressure been stable. Denies any syncope or presyncope Recent palpitations and evaluation per cardiology. No further evaluation recommended. He has not had any recent chest pains.  Past Medical History:  Diagnosis Date  . Bowel obstruction ([place])   . CAP (community acquired pneumonia)   . 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 GERD (gastroesophageal reflux disease)   . Hydrocephalus   . Hyperlipidemia   . Hypertension   . 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 Stroke ([place])   . TBI (traumatic brain injury) [facility])    Past Surgical History:  Procedure Laterality Date  . BRAIN SURGERY    . HERNIA REPAIR    . PROSTATECTOMY    . TEE WITHOUT CARDIOVERSION  03/22/2012   Procedure: TRANSESOPHAGEAL ECHOCARDIOGRAM (TEE);  Surgeon: [name], MD;  Location: [facility] ENDOSCOPY;  Service: Cardiovascular;  Laterality: N/A;    reports that he quit smoking about 49 years ago. His smoking use included Cigarettes. He has a 7.50 pack-year smoking history. He has never used smokeless tobacco. He reports that he does not drink alcohol or use drugs. family history includes  Heart disease in his sister. Allergies  Allergen Reactions  . Penicillins Rash     Review of Systems  Constitutional: Negative for appetite change, chills, fatigue, fever and unexpected weight change.  Eyes: Negative for visual disturbance.  Respiratory: Negative for cough, chest tightness and shortness of breath.   Cardiovascular: Positive for palpitations. Negative for chest pain and leg swelling.  Gastrointestinal: Negative for abdominal pain.  Genitourinary: Negative for dysuria.  Neurological: Negative for dizziness, syncope, weakness and headaches.       Objective:   Physical Exam  Constitutional: He appears well-developed and well-nourished.  HENT:  Mouth/Throat: Oropharynx is clear and moist.  Neck: Neck supple.  Cardiovascular: Normal rate.   Irregular pulse which is his baseline  Pulmonary/Chest: Effort normal and breath sounds normal. No respiratory distress. He has no wheezes. He has no rales.  Musculoskeletal: He exhibits no edema.  Neurological: He is alert.       Assessment:     #1 hypertension stable and at goal  #2 high-risk for falls  #3 history of palpitations with frequent PACs and PVCs  #4 hyperlipidemia    Plan:     -reminder for flu vaccination. They wish to wait and get an approximately one-month -We'll plan follow-up labs and about 4 months -Discussed fall prevention. Have suggested consider physical therapy. They have a neighbor who is a physical therapist may wish to consult with him first regarding some exercises. He appears to have very underdeveloped quadriceps muscles and we  have suggested strengthening quadriceps as one step in fall prevention  Eulas Post MD [facility] at [facility]

## 2016-01-16 ENCOUNTER — Other Ambulatory Visit: Payer: Self-pay | Admitting: Family Medicine

## 2016-01-16 NOTE — Telephone Encounter (Signed)
Refill OK for 6 months. 

## 2016-02-04 ENCOUNTER — Ambulatory Visit (INDEPENDENT_AMBULATORY_CARE_PROVIDER_SITE_OTHER): Payer: Medicare Other

## 2016-02-04 DIAGNOSIS — Z23 Encounter for immunization: Secondary | ICD-10-CM

## 2016-02-08 ENCOUNTER — Other Ambulatory Visit: Payer: Self-pay | Admitting: Family Medicine

## 2016-03-03 DIAGNOSIS — H903 Sensorineural hearing loss, bilateral: Secondary | ICD-10-CM | POA: Diagnosis not present

## 2016-03-05 DIAGNOSIS — D1801 Hemangioma of skin and subcutaneous tissue: Secondary | ICD-10-CM | POA: Diagnosis not present

## 2016-03-05 DIAGNOSIS — L814 Other melanin hyperpigmentation: Secondary | ICD-10-CM | POA: Diagnosis not present

## 2016-03-05 DIAGNOSIS — L821 Other seborrheic keratosis: Secondary | ICD-10-CM | POA: Diagnosis not present

## 2016-03-05 DIAGNOSIS — Z85828 Personal history of other malignant neoplasm of skin: Secondary | ICD-10-CM | POA: Diagnosis not present

## 2016-03-05 DIAGNOSIS — L570 Actinic keratosis: Secondary | ICD-10-CM | POA: Diagnosis not present

## 2016-03-15 ENCOUNTER — Other Ambulatory Visit: Payer: Self-pay | Admitting: Family Medicine

## 2016-04-09 ENCOUNTER — Other Ambulatory Visit: Payer: Self-pay

## 2016-04-16 ENCOUNTER — Other Ambulatory Visit: Payer: Self-pay | Admitting: Family Medicine

## 2016-05-04 HISTORY — PX: CATARACT EXTRACTION W/ INTRAOCULAR LENS  IMPLANT, BILATERAL: SHX1307

## 2016-05-13 ENCOUNTER — Ambulatory Visit (INDEPENDENT_AMBULATORY_CARE_PROVIDER_SITE_OTHER)
Admission: RE | Admit: 2016-05-13 | Discharge: 2016-05-13 | Disposition: A | Discharge status: Home / Self Care | Payer: Medicare Other | Source: Ambulatory Visit | Attending: Family Medicine | Admitting: Family Medicine

## 2016-05-13 ENCOUNTER — Ambulatory Visit (INDEPENDENT_AMBULATORY_CARE_PROVIDER_SITE_OTHER): Payer: Medicare Other | Admitting: Family Medicine

## 2016-05-13 ENCOUNTER — Encounter: Payer: Self-pay | Admitting: Family Medicine

## 2016-05-13 VITALS — BP 130/82 | HR 90 | Ht 74.0 in | Wt 190.9 lb

## 2016-05-13 DIAGNOSIS — Z9181 History of falling: Secondary | ICD-10-CM

## 2016-05-13 DIAGNOSIS — M25561 Pain in right knee: Secondary | ICD-10-CM

## 2016-05-13 DIAGNOSIS — R296 Repeated falls: Secondary | ICD-10-CM | POA: Diagnosis not present

## 2016-05-13 NOTE — Patient Instructions (Signed)
Try OTC glucosamine for arthritis symptoms Consider Tylenol 500 mg 1-2 every 6 hours as needed. We will set up PT.

## 2016-05-13 NOTE — Progress Notes (Signed)
Subjective:     Patient ID: Eric George, male   DOB: [birth year], 83 y.o.   MRN: [id number]  HPI Patient is seen accompanied with his wife with couple falls during the past year. Wife is concerned about his increased risk of falls. Is also complaining of frequent knee pains right greater than left as well as cervical neck pain. [place] there and had multiple x-rays. Left knee films revealed degenerative changes lateral compartment. He had cervical spine films which showed diffuse degenerative changes especially C5-C6 and C6-C7. No acute bony abnormality. He then had a second fall right before Christmas. He was going to the mailbox and bent over and states that his knee "gave way". He fell to the ground but denies any other injuries. He's had progressive knee pain right greater than left for several years. Avoids nonsteroidals. Takes occasional Tylenol not consistently.  Still walks several days per week but wife has noted his gait seems more uncertain. She states that he seems to be "dragging both feet ". Knee pain especially worse at night  Multiple chronic problems including history of recurrent small bowel obstruction, GERD, hyperlipidemia, hypertension, mild cognitive impairment, remote history of CVA and remote history of brain trauma  Past Medical History:  Diagnosis Date  . Bowel obstruction   . CAP (community acquired pneumonia)   . 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 GERD (gastroesophageal reflux disease)   . Hydrocephalus   . Hyperlipidemia   . Hypertension   . 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 Stroke ([place])   . TBI (traumatic brain injury) [facility])    Past Surgical History:  Procedure Laterality Date  . BRAIN SURGERY    . HERNIA REPAIR    . PROSTATECTOMY    . TEE WITHOUT CARDIOVERSION  03/22/2012   Procedure: TRANSESOPHAGEAL ECHOCARDIOGRAM (TEE);  Surgeon: [name], MD;   Location: [facility] ENDOSCOPY;  Service: Cardiovascular;  Laterality: N/A;    reports that he quit smoking about 49 years ago. His smoking use included Cigarettes. He has a 7.50 pack-year smoking history. He has never used smokeless tobacco. He reports that he does not drink alcohol or use drugs. family history includes Heart disease in his sister. Allergies  Allergen Reactions  . Penicillins Rash     Review of Systems  Respiratory: Negative for shortness of breath.   Cardiovascular: Negative for chest pain.  Genitourinary: Negative for dysuria.  Musculoskeletal: Positive for arthralgias, neck pain and neck stiffness.  Neurological: Negative for dizziness, syncope and weakness.       Objective:   Physical Exam  Constitutional: He is oriented to person, place, and time. He appears well-developed and well-nourished. No distress.  Cardiovascular: Normal rate and regular rhythm.   Pulmonary/Chest: Effort normal and breath sounds normal. No respiratory distress. He has no wheezes. He has no rales.  Musculoskeletal:  Right knee no effusion. No warmth. Full range of motion. No localized tenderness. Mild crepitus. Excellent range of motion both hips No leg edema  Neurological: He is alert and oriented to person, place, and time. No cranial nerve deficit. Coordination normal.       Assessment:     #1 progressive right knee pain. Suspect osteoarthritis.  #2 increased risk for falls    Plan:     -Set up physical therapy for general strengthening and fall risk reduction -Pain x-rays of  the right knee -Try over-the-counter Tylenol not to exceed 500 mg every 6 hours as needed for pain. They will also try over-the-counter glucosamine -Handout on fall prevention given  Eulas Post MD [facility] at [facility]

## 2016-05-13 NOTE — Progress Notes (Signed)
Pre visit review using our clinic review tool, if applicable. No additional management support is needed unless otherwise documented below in the visit note. 

## 2016-05-21 ENCOUNTER — Ambulatory Visit: Payer: Medicare Other | Admitting: Physical Therapy

## 2016-05-26 ENCOUNTER — Ambulatory Visit: Payer: Medicare Other | Attending: Family Medicine | Admitting: Physical Therapy

## 2016-05-26 ENCOUNTER — Encounter: Payer: Self-pay | Admitting: Physical Therapy

## 2016-05-26 DIAGNOSIS — M6281 Muscle weakness (generalized): Secondary | ICD-10-CM | POA: Diagnosis not present

## 2016-05-26 DIAGNOSIS — R262 Difficulty in walking, not elsewhere classified: Secondary | ICD-10-CM | POA: Diagnosis not present

## 2016-05-26 NOTE — Patient Instructions (Addendum)
Advanced Straight Leg Raise    With knees bent and feet on bed, straighten one leg and lift it up about 6 inches off the bed Repeat __10__ times each side per set. Do __1__ sets per session. Do __1__ sessions per day.  http://orth.exer.us/1108   Copyright  VHI. All rights reserved.   Abduction: Clam (Eccentric) - Side-Lying    Lie on side with knees bent. Lift top knee, keeping feet together. Keep trunk steady. Slowly lower for 3-5 seconds. _10__ reps per set, __1_ sets per day, .  http://ecce.exer.us/64   Copyright  VHI. All rights reserved.  Bracing With Bridging (Hook-Lying)    With neutral spine, tighten pelvic floor and abdominals and hold. Lift bottom. Repeat __10_ times. Do __1_ times a day.   Copyright  VHI. All rights reserved.    Posture Tips DO: - stand tall and erect - keep chin tucked in - keep head and shoulders in alignment - check posture regularly in mirror or large window - pull head back against headrest in car seat;  Change your position often.  Sit with lumbar support. DON'T: - slouch or slump while watching TV or reading - sit, stand or lie in one position  for too long;  Sitting is especially hard on the spine so if you sit at a desk/use the computer, then stand up often!   Copyright  VHI. All rights reserved.  Posture - Standing   Good posture is important. Avoid slouching and forward head thrust. Maintain curve in low back and align ears over shoul- ders, hips over ankles.  Pull your belly button in toward your back bone.   Copyright  VHI. All rights reserved.  Posture - Sitting   Sit upright, head facing forward. Try using a roll to support lower back. Keep shoulders relaxed, and avoid rounded back. Keep hips level with knees. Avoid crossing legs for long periods.   Copyright  VHI. All rights reserved.    [place] [address], [place], [place] [address] Phone # [phone] Fax [phone]   Zannie Cove, PT 05/26/16 3:24 PM

## 2016-05-27 NOTE — Therapy (Signed)
[facility] [address] [place], [place], [address] Phone: [phone]   Fax:  [phone]  Physical Therapy Evaluation  Patient Details  Name: Eric George MRN: [id number] Date of Birth: 01-Apr-1931 Referring Provider: Eulas Post, MD  Encounter Date: 05/26/2016      PT End of Session - 05/26/16 1442    Visit Number 1   Number of Visits 10   Date for PT Re-Evaluation 07/21/16   PT Start Time [id number]   PT Stop Time 1525   PT Time Calculation (min) 43 min   Activity Tolerance Patient tolerated treatment well   Behavior During Therapy [facility] for tasks assessed/performed      Past Medical History:  Diagnosis Date  . Bowel obstruction   . CAP (community acquired pneumonia)   . 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 GERD (gastroesophageal reflux disease)   . Hydrocephalus   . Hyperlipidemia   . Hypertension   . 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 Stroke ([place])   . TBI (traumatic brain injury) [facility])     Past Surgical History:  Procedure Laterality Date  . BRAIN SURGERY    . HERNIA REPAIR    . PROSTATECTOMY    . TEE WITHOUT CARDIOVERSION  03/22/2012   Procedure: TRANSESOPHAGEAL ECHOCARDIOGRAM (TEE);  Surgeon: [name], MD;  Location: [facility] ENDOSCOPY;  Service: Cardiovascular;  Laterality: N/A;    There were no vitals filed for this visit.       Subjective Assessment - 05/26/16 1447    Subjective Pt has memory deficits and hearing difficulties.  Has also had some knee pain.  Both falls occurred when the knees gave way with the knee pain.  Pt also has neck pain.  Reports no pain currently and unable.     Patient is accompained by: Family member  wife   Pertinent History hearing impairments and memory deficits   Limitations Walking   How long can you walk comfortably? about 1 mile x 2/ day   Patient Stated Goals overall more strength and better  balance   Currently in Pain? No/denies            [facility] PT Assessment - 05/27/16 0001      Assessment   Medical Diagnosis Z91.81 At risk for Falls   Onset Date/Surgical Date --  gradually over years   Prior Therapy no     Precautions   Precautions None     Restrictions   Weight Bearing Restrictions No     Home Environment   Living Environment Private residence   Living Arrangements Spouse/significant other     Prior Function   Level of Independence Independent with basic ADLs   Vocation Retired     Associate Professor   Overall Cognitive Status Impaired/Different from baseline   Area of Impairment Memory   Memory Decreased short-term memory  unsure how much memory deficit will effect PT outcomes   Memory Comments difficulty following instructions with HEP and postural corrections, but wife is very involved and helps remind him     Posture/Postural Control   Posture/Postural Control Postural limitations   Postural Limitations Rounded Shoulders;Forward head;Increased thoracic kyphosis     AROM   Overall AROM Comments WFL     Strength   Right Hip Flexion 4-/5   Right Hip Extension 4+/5   Right Hip External Rotation  4+/5   Right Hip  Internal Rotation 5/5   Right Hip ABduction 3+/5   Right Hip ADduction 3+/5   Left Hip Flexion 4-/5   Left Hip Extension 4-/5   Left Hip External Rotation 4-/5   Left Hip Internal Rotation 4/5   Left Hip ABduction 3+/5   Left Hip ADduction 3+/5   Right Knee Flexion 4/5   Right Knee Extension 4/5   Left Knee Extension 4/5     Palpation   Patella mobility no restrictions   Palpation comment no medial joint line tenderness of knee, upper traps shortened and tight     Ambulation/Gait   Gait Pattern Poor foot clearance - left;Poor foot clearance - right;Lateral trunk lean to right;Decreased stride length     Standardized Balance Assessment   Standardized Balance Assessment Timed Up and Go Test   Five times sit to stand comments  37 sec  - no UE support     Timed Up and Go Test   TUG Normal TUG   Normal TUG (seconds) 14                   [facility] Adult PT Treatment/Exercise - 05/27/16 0001      Self-Care   Self-Care Posture   Posture sitting posture, educated in using lumbar support and cues to lengthen back of the neck and squeeze and depress scapula     Knee/Hip Exercises: Supine   Bridges Strengthening;Both;10 reps   Straight Leg Raises Strengthening;Left;Right;10 reps     Knee/Hip Exercises: Sidelying   Clams 10x bilateral                PT Education - 05/26/16 1750    Education provided Yes   Education Details straight leg raise, clam, bridge   Person(s) Educated Patient   Methods Explanation;Demonstration;Verbal cues;Handout;Tactile cues   Comprehension Verbalized understanding;Returned demonstration          PT Short Term Goals - 05/27/16 0823      PT SHORT TERM GOAL #1   Title perform Berg Balance assessment   Time 4   Period Weeks   Status New     PT SHORT TERM GOAL #2   Title independent with initial HEP   Time 4   Period Weeks   Status New     PT SHORT TERM GOAL #3   Title 5 x sit to stand < or = to 33 seconds for reduced risk of falls   Time 4   Period Weeks   Status New     PT SHORT TERM GOAL #4   Title demonstrate heel strike 50% of the time for improved gait   Time 4   Period Weeks   Status New           PT Long Term Goals - 05/27/16 0825      PT LONG TERM GOAL #1   Title independent with advanced HEP   Time 8   Period Weeks   Status New     PT LONG TERM GOAL #2   Title 5 x sit to stand < or = to 27 sec for significant reduction in fall risk   Time 8   Period Weeks   Status New     PT LONG TERM GOAL #3   Title reports experiencing 50% less neck and knee pain due to improved gait and posture   Time 8   Period Weeks   Status New     PT LONG TERM GOAL #4   Title  improved TUG to 12 sec or less for reduced fall risk   Time 8   Period  Weeks   Status New               Plan - 05/27/16 0803    Clinical Impression Statement Pt presents to clinic for low complexity evaluation due to only one personal factor that will effect rehab.  Pt was accompanied by wife so she could assist in confirming the accuracy of information due to patient having memory impairments from a head trauma he had in the past.  Pt demonstrates LE weakness 3+/5 to 4/5.  Pt is at risk for falls having fallen in the past 6 months.  His TUG is 14sec and 5 x sit to stand 37sec demonstrating increased risk for falls. Pt ambulates without AD most of the time, but does use single point cane when taking a walk 2x/day.  No signs of significant OA in knees and AROM WNL for bilateral LE.  Pt has poor posture with increased thoracic kyphosis and forward head with increased activity in upper traps.  Pt demonstrate gait deviations with poor foot clearance and leaning Lt.  Pt will benefit from skilled PT to address these impairments in order to reduce risk of falls and return patient to activities as part of healthy lifestyle.   Rehab Potential Excellent   Clinical Impairments Affecting Rehab Potential memory impairments   PT Frequency 2x / week   PT Duration 8 weeks   PT Treatment/Interventions ADLs/Self Care Home Management;Cryotherapy;Electrical Stimulation;Moist Heat;Traction;Therapeutic activities;Therapeutic exercise;Neuromuscular re-education;Patient/family education;Manual techniques   PT Next Visit Plan BERG balance assessment, review postural education, postural strengthening, LE strengthening   PT Home Exercise Plan add some balance to HEP educate wife on helping as needed   Recommended Other Services none   Consulted and Agree with Plan of Care Patient      Patient will benefit from skilled therapeutic intervention in order to improve the following deficits and impairments:  Abnormal gait, Decreased strength, Difficulty walking, Increased muscle spasms,  Postural dysfunction, Pain  Visit Diagnosis: Muscle weakness (generalized)  Difficulty in walking, not elsewhere classified      G-Codes - May 30, 2016 1443    Functional Assessment Tool Used TUG, 5 x sit to stand, clinical reasoning   Functional Limitation Mobility: Walking and moving around   Mobility: Walking and Moving Around Current Status [phone]) At least 60 percent but less than 80 percent impaired, limited or restricted   Mobility: Walking and Moving Around Goal Status [phone]) At least 40 percent but less than 60 percent impaired, limited or restricted       Problem List Patient Active Problem List   Diagnosis Date Noted  . Premature atrial contractions 05/11/2015  . Sepsis ([place]) 04/29/2015  . History of small bowel obstruction 04/29/2015  . History of traumatic brain injury 04/29/2015  . Acute respiratory failure ([place]) 04/29/2015  . CAP (community acquired pneumonia)   . Unspecified cerebral artery occlusion with cerebral infarction 08/25/2012  . Aphasia 08/25/2012  . Macular degeneration 04/25/2012  . CVA (cerebral infarction) 03/20/2012  . Small bowel obstruction 09/12/2011  . Nausea & vomiting 09/12/2011  . Memory loss 04/01/2010  . 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 SKIN LESION 10/24/2009  . NEOPLASM, SKIN, UNCERTAIN BEHAVIOR 123456  . DYSGEUSIA 10/10/2009  . Hyperlipidemia 07/20/2008  . DEPRESSION 07/20/2008  . Essential hypertension 07/20/2008  . RHINITIS 07/20/2008  . COLONIC POLYPS, HX OF 07/20/2008  . GERD 07/18/2008  . PROSTATE CANCER,  HX OF 07/18/2008    Zannie Cove, PT 05/27/2016, 8:29 AM  [facility] [address], [place], [place], [address] Phone: [phone]   Fax:  [phone]  Name: TAVITA RODELA MRN: [id number] Date of Birth: 04-18-1931

## 2016-05-29 ENCOUNTER — Ambulatory Visit: Payer: Medicare Other | Admitting: Physical Therapy

## 2016-05-29 ENCOUNTER — Encounter: Payer: Self-pay | Admitting: Physical Therapy

## 2016-05-29 DIAGNOSIS — M6281 Muscle weakness (generalized): Secondary | ICD-10-CM

## 2016-05-29 DIAGNOSIS — R262 Difficulty in walking, not elsewhere classified: Secondary | ICD-10-CM

## 2016-05-29 NOTE — Therapy (Signed)
[facility] [address], [place], [place], [address] Phone: [phone]   Fax:  [phone]  Physical Therapy Treatment  Patient Details  Name: Eric George MRN: [id number] Date of Birth: May 17, 1930 Referring Provider: Eulas Post, MD  Encounter Date: 05/29/2016      PT End of Session - 05/29/16 1058    Visit Number 2   Number of Visits 10   Date for PT Re-Evaluation 07/21/16   PT Start Time 1056   PT Stop Time 1141   PT Time Calculation (min) 45 min   Activity Tolerance Patient tolerated treatment well   Behavior During Therapy [facility] for tasks assessed/performed      Past Medical History:  Diagnosis Date  . Bowel obstruction   . CAP (community acquired pneumonia)   . 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 GERD (gastroesophageal reflux disease)   . Hydrocephalus   . Hyperlipidemia   . Hypertension   . 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 Stroke ([place])   . TBI (traumatic brain injury) [facility])     Past Surgical History:  Procedure Laterality Date  . BRAIN SURGERY    . HERNIA REPAIR    . PROSTATECTOMY    . TEE WITHOUT CARDIOVERSION  03/22/2012   Procedure: TRANSESOPHAGEAL ECHOCARDIOGRAM (TEE);  Surgeon: [name], MD;  Location: [facility] ENDOSCOPY;  Service: Cardiovascular;  Laterality: N/A;    There were no vitals filed for this visit.      Subjective Assessment - 05/29/16 1057    Subjective No complaints this AM. Wife confirms pt is compliant with HEP.   Currently in Pain? No/denies   Multiple Pain Sites No                         [facility] Adult PT Treatment/Exercise - 05/29/16 0001      Knee/Hip Exercises: Aerobic   Nustep L1 x 6 min     Knee/Hip Exercises: Seated   Long Arc Quad Strengthening;Both;2 sets;10 reps;Weights   Long Arc Quad Weight 1 lbs.   Sit to Sand 2 sets;5 reps;with UE support     Knee/Hip Exercises:  Supine   Bridges Strengthening;Both;1 set;10 reps   Bridges with [employer] Strengthening;Both;1 set;10 reps   Straight Leg Raises Strengthening;Both;2 sets;10 reps                PT Education - 05/29/16 1114    Education provided Yes   Education Details HEP progression to include standing ex: marching and hip abduction   Person(s) Educated Patient;Spouse   Methods Explanation;Demonstration;Tactile cues;Verbal cues;Handout   Comprehension Verbalized understanding;Returned demonstration          PT Short Term Goals - 05/29/16 1059      PT SHORT TERM GOAL #2   Title independent with initial HEP   Time 4   Period Weeks   Status Achieved           PT Long Term Goals - 05/27/16 0825      PT LONG TERM GOAL #1   Title independent with advanced HEP   Time 8   Period Weeks   Status New     PT LONG TERM GOAL #2   Title 5 x sit to stand < or = to 27 sec for significant reduction in fall risk   Time 8   Period Weeks  Status New     PT LONG TERM GOAL #3   Title reports experiencing 50% less neck and knee pain due to improved gait and posture   Time 8   Period Weeks   Status New     PT LONG TERM GOAL #4   Title improved TUG to 12 sec or less for reduced fall risk   Time 8   Period Weeks   Status New               Plan - 05/29/16 1059    Clinical Impression Statement Pt and pt's wife in attendance for first treatment today. Pt was able to demonstrate HEP fully, some assistance needed with clam exercise to keep from rotating trunk., otherwise he di d all exercises properly.  Added some standing hip abduction and marching to HEP. Pt was able to do with good posture but demonstrates early fatigue. Tolerated some weighted quad exercises today, only one set as fatigue decreased the quality of the quality of the knee extensio.n.    Rehab Potential Excellent   PT Frequency 2x / week   PT Duration 8 weeks   PT Treatment/Interventions ADLs/Self Care Home  Management;Cryotherapy;Electrical Stimulation;Moist Heat;Traction;Therapeutic activities;Therapeutic exercise;Neuromuscular re-education;Patient/family education;Manual techniques   PT Next Visit Plan BERG next, Nustep, follow up with new standing exercises given today.    Consulted and Agree with Plan of Care Patient;Family member/caregiver   Family Member Consulted wife      Patient will benefit from skilled therapeutic intervention in order to improve the following deficits and impairments:  Abnormal gait, Decreased strength, Difficulty walking, Increased muscle spasms, Postural dysfunction, Pain  Visit Diagnosis: Muscle weakness (generalized)  Difficulty in walking, not elsewhere classified     Problem List Patient Active Problem List   Diagnosis Date Noted  . Premature atrial contractions 05/11/2015  . Sepsis ([place]) 04/29/2015  . History of small bowel obstruction 04/29/2015  . History of traumatic brain injury 04/29/2015  . Acute respiratory failure ([place]) 04/29/2015  . CAP (community acquired pneumonia)   . Unspecified cerebral artery occlusion with cerebral infarction 08/25/2012  . Aphasia 08/25/2012  . Macular degeneration 04/25/2012  . CVA (cerebral infarction) 03/20/2012  . Small bowel obstruction 09/12/2011  . Nausea & vomiting 09/12/2011  . Memory loss 04/01/2010  . 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 SKIN LESION 10/24/2009  . NEOPLASM, SKIN, UNCERTAIN BEHAVIOR 123456  . DYSGEUSIA 10/10/2009  . Hyperlipidemia 07/20/2008  . DEPRESSION 07/20/2008  . Essential hypertension 07/20/2008  . RHINITIS 07/20/2008  . COLONIC POLYPS, HX OF 07/20/2008  . GERD 07/18/2008  . PROSTATE CANCER, HX OF 07/18/2008    Damika Harmon, PTA 05/29/2016, 11:49 AM   [facility] [address] [place], [place], [address] Phone: [phone]   Fax:  [phone]  Name: Eric George MRN: [id number] Date of  Birth: 17-Nov-1930

## 2016-05-29 NOTE — Patient Instructions (Signed)
    Alternating Step    Standing taller than the guy in the picture, bend the knees like you are marching in place.  Repeat ___10_ times. Do 2____ sessions per day.  http://gt2.exer.us/493   Copyright  VHI. All rights reserved.  ABDUCTION: Standing (Active)    Stand, feet flat. Lift right leg out to side. Alternate sides with control . Complete _2__ sets of _10__ repetitions. Perform ___2 sessions per day.  http://gtsc.exer.us/110   Copyright  VHI. All rights reserved.       Resisted Horizontal Abduction: Bilateral   Sit initially holding  tubing in both hands, arms out in front. Keeping arms straight, pinch shoulder blades together and stretch arms out. Let's pull arms no greater than half of the range.  Repeat _10___ times per set. Do 2____ sets per session. Do _1-2___ sessions per day.

## 2016-06-02 ENCOUNTER — Encounter: Payer: Medicare Other | Admitting: Physical Therapy

## 2016-06-05 ENCOUNTER — Ambulatory Visit: Payer: Medicare Other | Attending: Family Medicine | Admitting: Physical Therapy

## 2016-06-05 ENCOUNTER — Encounter: Payer: Self-pay | Admitting: Physical Therapy

## 2016-06-05 DIAGNOSIS — M6281 Muscle weakness (generalized): Secondary | ICD-10-CM | POA: Insufficient documentation

## 2016-06-05 DIAGNOSIS — R262 Difficulty in walking, not elsewhere classified: Secondary | ICD-10-CM | POA: Insufficient documentation

## 2016-06-05 NOTE — Patient Instructions (Signed)
ANKLE: Dorsiflexion (Band)    Sit at edge of surface. Place band around top of foot. Keeping heel on floor, raise toes of banded foot. Hold __3_ seconds. Use ____red____ band. _15__ reps per set, _2__ sets per da  Copyright  VHI. All rights reserved.  [place] [address], [place], [place] [address] Phone # [phone] Fax [phone]

## 2016-06-05 NOTE — Therapy (Signed)
[facility] [address] [place], [place], [address] Phone: [phone]   Fax:  [phone]  Physical Therapy Treatment  Patient Details  Name: Eric George MRN: [id number] Date of Birth: 1931/01/05 Referring Provider: Eulas Post, MD  Encounter Date: 06/05/2016      PT End of Session - 06/05/16 1058    Visit Number 3   Number of Visits 10   Date for PT Re-Evaluation 07/21/16   PT Start Time 1059   PT Stop Time 1143   PT Time Calculation (min) 44 min   Activity Tolerance Patient tolerated treatment well   Behavior During Therapy [facility] for tasks assessed/performed      Past Medical History:  Diagnosis Date  . Bowel obstruction   . CAP (community acquired pneumonia)   . 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 GERD (gastroesophageal reflux disease)   . Hydrocephalus   . Hyperlipidemia   . Hypertension   . 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 Stroke ([place])   . TBI (traumatic brain injury) [facility])     Past Surgical History:  Procedure Laterality Date  . BRAIN SURGERY    . HERNIA REPAIR    . PROSTATECTOMY    . TEE WITHOUT CARDIOVERSION  03/22/2012   Procedure: TRANSESOPHAGEAL ECHOCARDIOGRAM (TEE);  Surgeon: [name], MD;  Location: [facility] ENDOSCOPY;  Service: Cardiovascular;  Laterality: N/A;    There were no vitals filed for this visit.      Subjective Assessment - 06/05/16 1101    Subjective Pt's wife states was running around at [place] Tuesday and got so fatigued that he had to get help getting to the car.  Pt states feeling okay today   Patient is accompained by: Family member  wife   Currently in Pain? No/denies                         [facility] Adult PT Treatment/Exercise - 06/05/16 0001      Knee/Hip Exercises: Aerobic   Nustep L3 x 6 min     Knee/Hip Exercises: Standing   Lateral Step Up Left;Both;10  reps;Step Height: 6"   Forward Step Up Right;Left;10 reps;Step Height: 6"     Knee/Hip Exercises: Seated   Long Arc Quad Strengthening;Both;2 sets;10 reps;Weights   Long Arc Quad Weight 4 lbs.   Sit to Sand 2 sets;5 reps;without UE support  sitting on 2 foam mats to elevate surface     Knee/Hip Exercises: Supine   Short Arc Quad Sets Strengthening;Right;Left;10 reps  4#   Bridges with [employer] Strengthening;Both;1 set;20 reps     Knee/Hip Exercises: Sidelying   Hip ADduction Strengthening;Left;Both;10 reps     Manual Therapy   Manual Therapy Soft tissue mobilization;Joint mobilization   Manual therapy comments supine   Joint Mobilization patella medial tracking   Soft tissue mobilization quad tendon, vastus lateralis     Ankle Exercises: Supine   T-Band dorsiflexion red band - 15 x each side                  PT Short Term Goals - 06/05/16 1132      PT SHORT TERM GOAL #3   Title 5 x sit to stand < or = to 33 seconds for reduced risk of falls   Time 4   Period Weeks   Status  On-going     PT SHORT TERM GOAL #4   Title demonstrate heel strike 50% of the time for improved gait   Time 4   Period Weeks   Status On-going           PT Long Term Goals - 05/27/16 0825      PT LONG TERM GOAL #1   Title independent with advanced HEP   Time 8   Period Weeks   Status New     PT LONG TERM GOAL #2   Title 5 x sit to stand < or = to 27 sec for significant reduction in fall risk   Time 8   Period Weeks   Status New     PT LONG TERM GOAL #3   Title reports experiencing 50% less neck and knee pain due to improved gait and posture   Time 8   Period Weeks   Status New     PT LONG TERM GOAL #4   Title improved TUG to 12 sec or less for reduced fall risk   Time 8   Period Weeks   Status New               Plan - 06/05/16 1147    Clinical Impression Statement Pt and wife were educated on treatment and HEP.  Pt had Lt knee pain today with stairs  and sit to stand as well as a little pain with LAQ.  Pt knee pain was reduced with STM to lateral structures of Lt knee (quads, IT band) with improved patellar tracking.  Pt need skilled PT to addres LE strength, balance and neck pain for improved function.   Rehab Potential Excellent   Clinical Impairments Affecting Rehab Potential memory impairments   PT Frequency 2x / week   PT Duration 8 weeks   PT Treatment/Interventions ADLs/Self Care Home Management;Cryotherapy;Electrical Stimulation;Moist Heat;Traction;Therapeutic activities;Therapeutic exercise;Neuromuscular re-education;Patient/family education;Manual techniques   PT Next Visit Plan BERG next, Nustep, follow up with new standing exercises given today, manual to vastus lateralis as needed   PT Home Exercise Plan add balance as tolerated   Consulted and Agree with Plan of Care Patient;Family member/caregiver   Family Member Consulted wife      Patient will benefit from skilled therapeutic intervention in order to improve the following deficits and impairments:  Abnormal gait, Decreased strength, Difficulty walking, Increased muscle spasms, Postural dysfunction, Pain  Visit Diagnosis: Difficulty in walking, not elsewhere classified  Muscle weakness (generalized)     Problem List Patient Active Problem List   Diagnosis Date Noted  . Premature atrial contractions 05/11/2015  . Sepsis ([place]) 04/29/2015  . History of small bowel obstruction 04/29/2015  . History of traumatic brain injury 04/29/2015  . Acute respiratory failure ([place]) 04/29/2015  . CAP (community acquired pneumonia)   . Unspecified cerebral artery occlusion with cerebral infarction 08/25/2012  . Aphasia 08/25/2012  . Macular degeneration 04/25/2012  . CVA (cerebral infarction) 03/20/2012  . Small bowel obstruction 09/12/2011  . Nausea & vomiting 09/12/2011  . Memory loss 04/01/2010  . 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 SKIN LESION 10/24/2009  .  NEOPLASM, SKIN, UNCERTAIN BEHAVIOR 123456  . DYSGEUSIA 10/10/2009  . Hyperlipidemia 07/20/2008  . DEPRESSION 07/20/2008  . Essential hypertension 07/20/2008  . RHINITIS 07/20/2008  . COLONIC POLYPS, HX OF 07/20/2008  . GERD 07/18/2008  . PROSTATE CANCER, HX OF 07/18/2008    Zannie Cove, PT 06/05/2016, 12:01 PM  [facility] [address], [place], [place], [address] Phone: [phone]   Fax:  [phone]  Name: Eric George MRN: [id number] Date of Birth: [birth year]

## 2016-06-08 ENCOUNTER — Encounter: Payer: Self-pay | Admitting: Physical Therapy

## 2016-06-08 ENCOUNTER — Ambulatory Visit: Payer: Medicare Other | Admitting: Physical Therapy

## 2016-06-08 DIAGNOSIS — R262 Difficulty in walking, not elsewhere classified: Secondary | ICD-10-CM | POA: Diagnosis not present

## 2016-06-08 DIAGNOSIS — M6281 Muscle weakness (generalized): Secondary | ICD-10-CM | POA: Diagnosis not present

## 2016-06-08 NOTE — Therapy (Signed)
[facility] [address] [place], [place], [address] Phone: [phone]   Fax:  [phone]  Physical Therapy Treatment  Patient Details  Name: Eric George MRN: [id number] Date of Birth: Oct 08, 1930 Referring Provider: Eulas Post, MD  Encounter Date: 06/08/2016      PT End of Session - 06/08/16 1405    Visit Number 4   Number of Visits 10   Date for PT Re-Evaluation 07/21/16   PT Start Time 1400   PT Stop Time 1443   PT Time Calculation (min) 43 min   Activity Tolerance Patient tolerated treatment well   Behavior During Therapy [facility] for tasks assessed/performed      Past Medical History:  Diagnosis Date  . Bowel obstruction   . CAP (community acquired pneumonia)   . 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 GERD (gastroesophageal reflux disease)   . Hydrocephalus   . Hyperlipidemia   . Hypertension   . 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 Stroke ([place])   . TBI (traumatic brain injury) [facility])     Past Surgical History:  Procedure Laterality Date  . BRAIN SURGERY    . HERNIA REPAIR    . PROSTATECTOMY    . TEE WITHOUT CARDIOVERSION  03/22/2012   Procedure: TRANSESOPHAGEAL ECHOCARDIOGRAM (TEE);  Surgeon: [name], MD;  Location: [facility] ENDOSCOPY;  Service: Cardiovascular;  Laterality: N/A;    There were no vitals filed for this visit.      Subjective Assessment - 06/08/16 1402    Subjective Pt's wife states he is doing well with exercising at home and walking when the weather is good. Pt reports a little bit of knee pain today.    Patient is accompained by: Family member   Pertinent History hearing impairments and memory deficits   Limitations Walking   How long can you walk comfortably? about 1 mile x 2/ day   Patient Stated Goals overall more strength and better balance   Currently in Pain? Yes   Pain Score 2    Pain Location Knee   Pain  Orientation Left   Pain Descriptors / Indicators Sore   Pain Type Acute pain   Pain Onset Today                         [facility] Adult PT Treatment/Exercise - 06/08/16 0001      Knee/Hip Exercises: Aerobic   Nustep L2 x 7 minutes  Therapist present to discuss treatment     Knee/Hip Exercises: Standing   Hip Flexion Stengthening;Both;2 sets;10 reps   Hip Abduction Stengthening;Both;2 sets;10 reps   Hip Extension Stengthening;Both;2 sets;10 reps   Lateral Step Up Left;Both;10 reps;Step Height: 6"   Forward Step Up Right;Left;10 reps;Step Height: 6"   Other Standing Knee Exercises Toe taps  x20 #3 at 6" step     Knee/Hip Exercises: Seated   Long Arc Quad Strengthening;Both;2 sets;10 reps;Weights   Long Arc Quad Weight 4 lbs.   Sit to Sand 2 sets;5 reps;without UE support  sitting on 2 foam mats to elevate surface     Knee/Hip Exercises: Supine   Short Arc Quad Sets Strengthening;Right;Left;10 reps  4#   Bridges with [employer] Strengthening;Both;1 set;20 reps   Straight Leg Raises --     Ankle Exercises: Supine   T-Band dorsiflexion red band - 15 x each side  PT Short Term Goals - 06/08/16 1405      PT SHORT TERM GOAL #1   Title perform Berg Balance assessment   Time 4   Period Weeks   Status On-going     PT SHORT TERM GOAL #2   Title independent with initial HEP   Time 4   Period Weeks   Status Achieved     PT SHORT TERM GOAL #4   Title demonstrate heel strike 50% of the time for improved gait   Time 4   Period Weeks   Status On-going           PT Long Term Goals - 06/08/16 1405      PT LONG TERM GOAL #1   Title independent with advanced HEP   Time 8   Period Weeks   Status On-going     PT LONG TERM GOAL #2   Title 5 x sit to stand < or = to 27 sec for significant reduction in fall risk   Time 8   Period Weeks   Status On-going     PT LONG TERM GOAL #3   Title reports experiencing 50% less neck and  knee pain due to improved gait and posture   Time 8   Period Weeks   Status On-going     PT LONG TERM GOAL #4   Title improved TUG to 12 sec or less for reduced fall risk   Time 8   Period Weeks   Status On-going               Plan - 06/08/16 1443    Clinical Impression Statement Pt able to tolerate all exercises well needing some repeated instructions and close supervision due to decreased memory. Pt will continue to progress with balance and strengthening exercsies to improve gait and safe community mobility.    Rehab Potential Excellent   Clinical Impairments Affecting Rehab Potential memory impairments   PT Frequency 2x / week   PT Duration 8 weeks   PT Treatment/Interventions ADLs/Self Care Home Management;Cryotherapy;Electrical Stimulation;Moist Heat;Traction;Therapeutic activities;Therapeutic exercise;Neuromuscular re-education;Patient/family education;Manual techniques   PT Next Visit Plan BERG balance test   Consulted and Agree with Plan of Care Patient;Family member/caregiver   Family Member Consulted wife      Patient will benefit from skilled therapeutic intervention in order to improve the following deficits and impairments:  Abnormal gait, Decreased strength, Difficulty walking, Increased muscle spasms, Postural dysfunction, Pain  Visit Diagnosis: Difficulty in walking, not elsewhere classified  Muscle weakness (generalized)     Problem List Patient Active Problem List   Diagnosis Date Noted  . Premature atrial contractions 05/11/2015  . Sepsis ([place]) 04/29/2015  . History of small bowel obstruction 04/29/2015  . History of traumatic brain injury 04/29/2015  . Acute respiratory failure ([place]) 04/29/2015  . CAP (community acquired pneumonia)   . Unspecified cerebral artery occlusion with cerebral infarction 08/25/2012  . Aphasia 08/25/2012  . Macular degeneration 04/25/2012  . CVA (cerebral infarction) 03/20/2012  . Small bowel obstruction 09/12/2011   . Nausea & vomiting 09/12/2011  . Memory loss 04/01/2010  . 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 SKIN LESION 10/24/2009  . NEOPLASM, SKIN, UNCERTAIN BEHAVIOR 123456  . DYSGEUSIA 10/10/2009  . Hyperlipidemia 07/20/2008  . DEPRESSION 07/20/2008  . Essential hypertension 07/20/2008  . RHINITIS 07/20/2008  . COLONIC POLYPS, HX OF 07/20/2008  . GERD 07/18/2008  . PROSTATE CANCER, HX OF 07/18/2008    Mikle Bosworth PTA 06/08/2016, 2:47 PM  [facility] [address], [place], [place], [address] Phone: [phone]   Fax:  [phone]  Name: Eric George MRN: [id number] Date of Birth: [birth year]

## 2016-06-09 ENCOUNTER — Encounter: Payer: Medicare Other | Admitting: Physical Therapy

## 2016-06-12 ENCOUNTER — Encounter: Payer: Self-pay | Admitting: Physical Therapy

## 2016-06-12 ENCOUNTER — Ambulatory Visit: Payer: Medicare Other | Admitting: Physical Therapy

## 2016-06-12 DIAGNOSIS — R262 Difficulty in walking, not elsewhere classified: Secondary | ICD-10-CM

## 2016-06-12 DIAGNOSIS — M6281 Muscle weakness (generalized): Secondary | ICD-10-CM | POA: Diagnosis not present

## 2016-06-12 NOTE — Therapy (Signed)
[facility] [address], [place], [place], [address] Phone: [phone]   Fax:  [phone]  Physical Therapy Treatment  Patient Details  Name: Eric George MRN: [id number] Date of Birth: Oct 27, 1930 Referring Provider: Eulas Post, MD  Encounter Date: 06/12/2016      PT End of Session - 06/12/16 1104    Visit Number 5   Number of Visits 10   Date for PT Re-Evaluation 07/21/16   PT Start Time 1058   PT Stop Time 1141   PT Time Calculation (min) 43 min   Activity Tolerance Patient tolerated treatment well   Behavior During Therapy [facility] for tasks assessed/performed      Past Medical History:  Diagnosis Date  . Bowel obstruction   . CAP (community acquired pneumonia)   . 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 GERD (gastroesophageal reflux disease)   . Hydrocephalus   . Hyperlipidemia   . Hypertension   . 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 Stroke ([place])   . TBI (traumatic brain injury) [facility])     Past Surgical History:  Procedure Laterality Date  . BRAIN SURGERY    . HERNIA REPAIR    . PROSTATECTOMY    . TEE WITHOUT CARDIOVERSION  03/22/2012   Procedure: TRANSESOPHAGEAL ECHOCARDIOGRAM (TEE);  Surgeon: [name], MD;  Location: [facility] ENDOSCOPY;  Service: Cardiovascular;  Laterality: N/A;    There were no vitals filed for this visit.      Subjective Assessment - 06/12/16 1100    Subjective Pt states he is doing well today. Having some pain in the neck. Pt's wife stated he always has neck pain but seems to be getting worse.   Patient is accompained by: Family member   Pertinent History hearing impairments and memory deficits   Limitations Walking   How long can you walk comfortably? about 1 mile x 2/ day   Patient Stated Goals overall more strength and better balance   Currently in Pain? Yes   Pain Score 3    Pain Location Neck   Pain  Orientation Left   Pain Descriptors / Indicators Sore   Pain Type Acute pain;Chronic pain   Pain Onset More than a month ago                         [facility] Adult PT Treatment/Exercise - 06/12/16 0001      Knee/Hip Exercises: Aerobic   Nustep L3 x 7 minutes  Therapist present to discuss treatment     Knee/Hip Exercises: Standing   Hip Flexion Stengthening;Both;2 sets;10 reps   Hip Abduction Stengthening;Both;2 sets;10 reps   Hip Extension Stengthening;Both;2 sets;10 reps   Forward Step Up Right;Left;10 reps;Step Height: 6"   Rebounder Weight shifting 3 ways x 1 minute   Other Standing Knee Exercises Toe taps  x20 #4 at 6" step     Knee/Hip Exercises: Seated   Long Arc Quad Strengthening;Both;2 sets;10 reps;Weights   Long Arc Quad Weight 4 lbs.   Abduction/Adduction  Strengthening;Both;20 reps  Red band   Sit to Sand 2 sets;5 reps;without UE support  sitting on 2 foam mats to elevate surface     Knee/Hip Exercises: Supine   Short Arc Quad Sets Strengthening;Right;Left;10 reps  4#   Bridges Strengthening;Both;1 set;10 reps   Other Supine Knee/Hip Exercises --     Shoulder Exercises:  Supine   Horizontal ABduction Strengthening;Both;20 reps;Theraband   Theraband Level (Shoulder Horizontal ABduction) Level 2 (Red)   Other Supine Exercises Shoulder extension  Hooklying Green x20     Ankle Exercises: Supine   T-Band dorsiflexion, IV, EV red band - 15 x each side                  PT Short Term Goals - 06/08/16 1405      PT SHORT TERM GOAL #1   Title perform Berg Balance assessment   Time 4   Period Weeks   Status On-going     PT SHORT TERM GOAL #2   Title independent with initial HEP   Time 4   Period Weeks   Status Achieved     PT SHORT TERM GOAL #4   Title demonstrate heel strike 50% of the time for improved gait   Time 4   Period Weeks   Status On-going           PT Long Term Goals - 06/08/16 1405      PT LONG TERM GOAL #1    Title independent with advanced HEP   Time 8   Period Weeks   Status On-going     PT LONG TERM GOAL #2   Title 5 x sit to stand < or = to 27 sec for significant reduction in fall risk   Time 8   Period Weeks   Status On-going     PT LONG TERM GOAL #3   Title reports experiencing 50% less neck and knee pain due to improved gait and posture   Time 8   Period Weeks   Status On-going     PT LONG TERM GOAL #4   Title improved TUG to 12 sec or less for reduced fall risk   Time 8   Period Weeks   Status On-going               Plan - 06/12/16 1106    Clinical Impression Statement Pt progressing well with strenghtening and balance. Pt continues to have abnormal gait patten and poor posture. Pt will continue to benefit from skilled thearpy for core strenght and postural training   PT Next Visit Plan Shoulder strengthening, postural training, BERG   Family Member Consulted wife      Patient will benefit from skilled therapeutic intervention in order to improve the following deficits and impairments:     Visit Diagnosis: Difficulty in walking, not elsewhere classified  Muscle weakness (generalized)     Problem List Patient Active Problem List   Diagnosis Date Noted  . Premature atrial contractions 05/11/2015  . Sepsis ([place]) 04/29/2015  . History of small bowel obstruction 04/29/2015  . History of traumatic brain injury 04/29/2015  . Acute respiratory failure ([place]) 04/29/2015  . CAP (community acquired pneumonia)   . Unspecified cerebral artery occlusion with cerebral infarction 08/25/2012  . Aphasia 08/25/2012  . Macular degeneration 04/25/2012  . CVA (cerebral infarction) 03/20/2012  . Small bowel obstruction 09/12/2011  . Nausea & vomiting 09/12/2011  . Memory loss 04/01/2010  . 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 SKIN LESION 10/24/2009  . NEOPLASM, SKIN, UNCERTAIN BEHAVIOR 123456  . DYSGEUSIA 10/10/2009  . Hyperlipidemia 07/20/2008  .  DEPRESSION 07/20/2008  . Essential hypertension 07/20/2008  . RHINITIS 07/20/2008  . COLONIC POLYPS, HX OF 07/20/2008  . GERD 07/18/2008  . PROSTATE CANCER, HX OF 07/18/2008    Mikle Bosworth PTA 06/12/2016, 11:45 AM  [facility] [address] [place], [place], [address] Phone: [phone]   Fax:  [phone]  Name: Eric George MRN: [id number] Date of Birth: 09/11/1930

## 2016-06-16 ENCOUNTER — Ambulatory Visit: Payer: Medicare Other | Admitting: Physical Therapy

## 2016-06-16 ENCOUNTER — Encounter: Payer: Self-pay | Admitting: Physical Therapy

## 2016-06-16 DIAGNOSIS — M6281 Muscle weakness (generalized): Secondary | ICD-10-CM

## 2016-06-16 DIAGNOSIS — R262 Difficulty in walking, not elsewhere classified: Secondary | ICD-10-CM | POA: Diagnosis not present

## 2016-06-16 NOTE — Therapy (Signed)
[facility] [address], [place], [place], [address] Phone: [phone]   Fax:  [phone]  Physical Therapy Treatment  Patient Details  Name: Eric George MRN: [id number] Date of Birth: 1931-01-12 Referring Provider: Eulas Post, MD  Encounter Date: 06/16/2016      PT End of Session - 06/16/16 1411    Visit Number 6   Number of Visits 10   Date for PT Re-Evaluation 07/21/16   PT Start Time [id number]   PT Stop Time 1443   PT Time Calculation (min) 40 min   Activity Tolerance Patient tolerated treatment well   Behavior During Therapy [facility] for tasks assessed/performed      Past Medical History:  Diagnosis Date  . Bowel obstruction   . CAP (community acquired pneumonia)   . 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 GERD (gastroesophageal reflux disease)   . Hydrocephalus   . Hyperlipidemia   . Hypertension   . 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 Stroke ([place])   . TBI (traumatic brain injury) [facility])     Past Surgical History:  Procedure Laterality Date  . BRAIN SURGERY    . HERNIA REPAIR    . PROSTATECTOMY    . TEE WITHOUT CARDIOVERSION  03/22/2012   Procedure: TRANSESOPHAGEAL ECHOCARDIOGRAM (TEE);  Surgeon: [name], MD;  Location: [facility] ENDOSCOPY;  Service: Cardiovascular;  Laterality: N/A;    There were no vitals filed for this visit.      Subjective Assessment - 06/16/16 1406    Subjective Pt reports doing well today. Having some pain in Lt knee and neck.    Patient is accompained by: Family member   Pertinent History hearing impairments and memory deficits   Limitations Walking   How long can you walk comfortably? about 1 mile x 2/ day   Patient Stated Goals overall more strength and better balance   Currently in Pain? Yes   Pain Score 3    Pain Location Neck   Pain Orientation Left   Pain Descriptors / Indicators Tightness;Sore   Pain  Type Chronic pain   Pain Onset More than a month ago                         [facility] Adult PT Treatment/Exercise - 06/16/16 0001      Knee/Hip Exercises: Aerobic   Stationary Bike L2 x 7 minutes  Therapist present to discuss treatment     Knee/Hip Exercises: Standing   Hip Flexion Stengthening;Both;2 sets;10 reps   Hip Abduction Stengthening;Both;2 sets;10 reps   Hip Extension Stengthening;Both;2 sets;10 reps   Forward Step Up Right;Left;10 reps;Step Height: 6"   Rebounder Weight shifting 3 ways x 1 minute     Knee/Hip Exercises: Seated   Sit to Sand 2 sets;5 reps;without UE support  sitting on 2 foam mats to elevate surface     Knee/Hip Exercises: Supine   Short Arc Quad Sets Strengthening;Right;Left;10 reps  #5   Bridges Strengthening;Both;1 set;10 reps   Knee Flexion 20 reps;Strengthening  #5 marching     Shoulder Exercises: Supine   Horizontal ABduction Strengthening;Both;20 reps;Theraband   Theraband Level (Shoulder Horizontal ABduction) Level 2 (Red)   Other Supine Exercises Shoulder extension  Hooklying Green x20     Shoulder Exercises: Standing   Extension Strengthening;Both;10 reps  #25   Row Strengthening;Both;10 reps  #25  Ankle Exercises: Supine   T-Band dorsiflexion, IV, EV green band - 15 x each side                  PT Short Term Goals - 06/16/16 1447      PT SHORT TERM GOAL #2   Title independent with initial HEP   Time 4   Period Weeks   Status Achieved     PT SHORT TERM GOAL #3   Title 5 x sit to stand < or = to 33 seconds for reduced risk of falls   Time 4   Period Weeks   Status On-going     PT SHORT TERM GOAL #4   Title demonstrate heel strike 50% of the time for improved gait   Time 4   Period Weeks   Status On-going           PT Long Term Goals - 06/16/16 1448      PT LONG TERM GOAL #1   Title independent with advanced HEP   Time 8   Period Weeks   Status On-going     PT LONG TERM GOAL #2    Title 5 x sit to stand < or = to 27 sec for significant reduction in fall risk   Time 8   Period Weeks   Status On-going     PT LONG TERM GOAL #3   Title reports experiencing 50% less neck and knee pain due to improved gait and posture   Time 8   Period Weeks   Status On-going     PT LONG TERM GOAL #4   Title improved TUG to 12 sec or less for reduced fall risk   Time 8   Period Weeks   Status On-going               Plan - 06/16/16 1438    Clinical Impression Statement Pt continues to progress with LE and core strength. Does better with Nustep than Bike. Pt continus to have some increased neck tightness and may benefit from manual therapy to neck. Pt will continue to benefit frm skilled therapy for strength and balance.    Rehab Potential Excellent   Clinical Impairments Affecting Rehab Potential memory impairments   PT Frequency 2x / week   PT Duration 8 weeks   PT Treatment/Interventions ADLs/Self Care Home Management;Cryotherapy;Electrical Stimulation;Moist Heat;Traction;Therapeutic activities;Therapeutic exercise;Neuromuscular re-education;Patient/family education;Manual techniques   PT Next Visit Plan Postural strength, manual therapy to neck, check short term goals   Family Member Consulted wife      Patient will benefit from skilled therapeutic intervention in order to improve the following deficits and impairments:  Abnormal gait, Decreased strength, Difficulty walking, Increased muscle spasms, Postural dysfunction, Pain  Visit Diagnosis: Difficulty in walking, not elsewhere classified  Muscle weakness (generalized)     Problem List Patient Active Problem List   Diagnosis Date Noted  . Premature atrial contractions 05/11/2015  . Sepsis ([place]) 04/29/2015  . History of small bowel obstruction 04/29/2015  . History of traumatic brain injury 04/29/2015  . Acute respiratory failure ([place]) 04/29/2015  . CAP (community acquired pneumonia)   . Unspecified  cerebral artery occlusion with cerebral infarction 08/25/2012  . Aphasia 08/25/2012  . Macular degeneration 04/25/2012  . CVA (cerebral infarction) 03/20/2012  . Small bowel obstruction 09/12/2011  . Nausea & vomiting 09/12/2011  . Memory loss 04/01/2010  . 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 SKIN LESION 10/24/2009  . NEOPLASM, SKIN, UNCERTAIN BEHAVIOR  10/10/2009  . DYSGEUSIA 10/10/2009  . Hyperlipidemia 07/20/2008  . DEPRESSION 07/20/2008  . Essential hypertension 07/20/2008  . RHINITIS 07/20/2008  . COLONIC POLYPS, HX OF 07/20/2008  . GERD 07/18/2008  . PROSTATE CANCER, HX OF 07/18/2008    Mikle Bosworth PTA 06/16/2016, 2:58 PM  [facility] [address], [place], [place], [address] Phone: [phone]   Fax:  [phone]  Name: Eric George MRN: [id number] Date of Birth: 1930-11-16

## 2016-06-23 ENCOUNTER — Encounter: Payer: Self-pay | Admitting: Physical Therapy

## 2016-06-23 ENCOUNTER — Ambulatory Visit: Payer: Medicare Other | Admitting: Physical Therapy

## 2016-06-23 DIAGNOSIS — M6281 Muscle weakness (generalized): Secondary | ICD-10-CM | POA: Diagnosis not present

## 2016-06-23 DIAGNOSIS — R262 Difficulty in walking, not elsewhere classified: Secondary | ICD-10-CM | POA: Diagnosis not present

## 2016-06-23 NOTE — Therapy (Signed)
[facility] [address], [place], [place], [address] Phone: [phone]   Fax:  [phone]  Physical Therapy Treatment  Patient Details  Name: Eric George MRN: [id number] Date of Birth: May 05, 1930 Referring Provider: Eulas Post, MD  Encounter Date: 06/23/2016      PT End of Session - 06/23/16 1403    Visit Number 7   Number of Visits 10   Date for PT Re-Evaluation 07/21/16   PT Start Time 1400   PT Stop Time 1438   PT Time Calculation (min) 38 min   Activity Tolerance Patient tolerated treatment well   Behavior During Therapy [facility] for tasks assessed/performed      Past Medical History:  Diagnosis Date  . Bowel obstruction   . CAP (community acquired pneumonia)   . 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 GERD (gastroesophageal reflux disease)   . Hydrocephalus   . Hyperlipidemia   . Hypertension   . 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 Stroke ([place])   . TBI (traumatic brain injury) [facility])     Past Surgical History:  Procedure Laterality Date  . BRAIN SURGERY    . HERNIA REPAIR    . PROSTATECTOMY    . TEE WITHOUT CARDIOVERSION  03/22/2012   Procedure: TRANSESOPHAGEAL ECHOCARDIOGRAM (TEE);  Surgeon: [name], MD;  Location[facility] ENDOSCOPY;  Service: Cardiovascular;  Laterality: N/A;    There were no vitals filed for this visit.      Subjective Assessment - 06/23/16 1402    Subjective Pt wife stated he has been doing all home exercises at home and may need some new ones.    Patient is accompained by: Family member   Pertinent History hearing impairments and memory deficits   Limitations Walking   How long can you walk comfortably? about 1 mile x 2/ day   Patient Stated Goals overall more strength and better balance   Currently in Pain? Yes   Pain Score 3    Pain Location Neck   Pain Orientation Left   Pain Descriptors / Indicators  Tightness;Sore   Pain Type Chronic pain   Pain Onset More than a month ago   Multiple Pain Sites No            [facility] PT Assessment - 06/23/16 0001      Standardized Balance Assessment   Standardized Balance Assessment --                     [facility] Adult PT Treatment/Exercise - 06/23/16 0001      Berg Balance Test   Sit to Stand Able to stand  independently using hands   Standing Unsupported Able to stand safely 2 minutes   Sitting with Back Unsupported but Feet Supported on Floor or Stool Able to sit safely and securely 2 minutes   Stand to Sit Controls descent by using hands   Transfers Able to transfer safely, minor use of hands   Standing Unsupported with Eyes Closed Able to stand 10 seconds safely   Standing Ubsupported with Feet Together Able to place feet together independently and stand 1 minute safely   From Standing, Reach Forward with Outstretched Arm Can reach confidently >25 cm (10")   From Standing Position, Pick up Object from Floor Able to pick up shoe, needs supervision   From Standing Position, Turn to Look Behind Over  each Shoulder Looks behind one side only/other side shows less weight shift   Turn 360 Degrees Able to turn 360 degrees safely one side only in 4 seconds or less   Standing Unsupported, Alternately Place Feet on Step/Stool Able to stand independently and complete 8 steps >20 seconds   Standing Unsupported, One Foot in Front Needs help to step but can hold 15 seconds   Standing on One Leg Able to lift leg independently and hold 5-10 seconds   Total Score 46     Knee/Hip Exercises: Aerobic   Nustep L3 x 7 minutes  Therapist present to discuss treatment     Knee/Hip Exercises: Standing   Hip Flexion Stengthening;Both;2 sets;10 reps   Hip Abduction Stengthening;Both;2 sets;10 reps   Hip Extension Stengthening;Both;2 sets;10 reps   Forward Step Up Right;Left;10 reps;Step Height: 6"     Knee/Hip Exercises: Seated   Sit to Sand 2  sets;5 reps;without UE support  sitting on 2 foam mats to elevate surface                PT Education - 06/23/16 1408    Education provided Yes   Education Details HEP progression   Person(s) Educated Patient;Spouse   Methods Explanation;Demonstration;Handout   Comprehension Verbalized understanding;Returned demonstration          PT Short Term Goals - 06/23/16 1403      PT SHORT TERM GOAL #1   Title perform Berg Balance assessment   Baseline 46 baseline Moderate Fall Risk   Time 4   Period Weeks   Status Achieved     PT SHORT TERM GOAL #3   Title 5 x sit to stand < or = to 33 seconds for reduced risk of falls   Time 4     PT SHORT TERM GOAL #4   Title demonstrate heel strike 50% of the time for improved gait   Time 4   Period Weeks   Status Achieved           PT Long Term Goals - 06/23/16 1438      PT LONG TERM GOAL #1   Title independent with advanced HEP   Time 8   Period Weeks   Status On-going     PT LONG TERM GOAL #3   Title reports experiencing 50% less neck and knee pain due to improved gait and posture   Time 8   Period Weeks   Status On-going               Plan - 06/23/16 1445    Clinical Impression Statement BERG balance assesment performed. Pt scored 46 indicating a moderate fall risk. Pt given updated HEP for standing LE and UE strengthening. Pt continues to have decreased posture needing frequent verbal cues for posture and control during exercsies. Pt will continue to benefit from skilled therapy for LE strengthening and balance.    Rehab Potential Excellent   Clinical Impairments Affecting Rehab Potential memory impairments   PT Frequency 2x / week   PT Duration 8 weeks   PT Treatment/Interventions ADLs/Self Care Home Management;Cryotherapy;Electrical Stimulation;Moist Heat;Traction;Therapeutic activities;Therapeutic exercise;Neuromuscular re-education;Patient/family education;Manual techniques   PT Next Visit Plan Postural  strengthening, LE strength   Consulted and Agree with Plan of Care Patient;Family member/caregiver   Family Member Consulted wife      Patient will benefit from skilled therapeutic intervention in order to improve the following deficits and impairments:  Abnormal gait, Decreased strength, Difficulty walking, Increased muscle spasms, Postural dysfunction,  Pain  Visit Diagnosis: Difficulty in walking, not elsewhere classified  Muscle weakness (generalized)     Problem List Patient Active Problem List   Diagnosis Date Noted  . Premature atrial contractions 05/11/2015  . Sepsis ([place]) 04/29/2015  . History of small bowel obstruction 04/29/2015  . History of traumatic brain injury 04/29/2015  . Acute respiratory failure ([place]) 04/29/2015  . CAP (community acquired pneumonia)   . Unspecified cerebral artery occlusion with cerebral infarction 08/25/2012  . Aphasia 08/25/2012  . Macular degeneration 04/25/2012  . CVA (cerebral infarction) 03/20/2012  . Small bowel obstruction 09/12/2011  . Nausea & vomiting 09/12/2011  . Memory loss 04/01/2010  . 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 SKIN LESION 10/24/2009  . NEOPLASM, SKIN, UNCERTAIN BEHAVIOR [id number]  . DYSGEUSIA 10/10/2009  . Hyperlipidemia 07/20/2008  . DEPRESSION 07/20/2008  . Essential hypertension 07/20/2008  . RHINITIS 07/20/2008  . COLONIC POLYPS, HX OF 07/20/2008  . GERD 07/18/2008  . PROSTATE CANCER, HX OF 07/18/2008    Mikle Bosworth PTA 06/23/2016, 2:49 PM  [facility] [address] [place], [place], [address] Phone: [phone]   Fax:  [phone]  Name: Eric George MRN: [id number] Date of Birth: [birth year]

## 2016-06-23 NOTE — Patient Instructions (Signed)
(  Home) Retraction: Row - Bilateral (Anchor)    Facing anchor, arms reaching forward, pull hands toward stomach, pinching shoulder blades together. Repeat __10__ times per set. Do _2__ sets per session. Do __3__ sessions per week.   Copyright  VHI. All rights reserved.  (Home) Extension / Flexion (Assist)    Face anchor, right arm as far forward and up as is pain free. Pull arm down toward side. Repeat __10__ times per set. Do __2__ sets per session. Do _3___ sessions per week.  Copyright  VHI. All rights reserved.   ABDUCTION: Standing - Resistance Band (Active)   Stand, feet flat. Against yellow resistance band, lift right leg out to side. Complete _2__ sets of _10__ repetitions.   ADDUCTION: Standing - Stable: Resistance Band (Active)   Stand, right leg out to side as far as possible. Against yellow resistance band, draw leg in across midline. Complete __2_ sets of _10__ repetitions.   Strengthening: Hip Flexion - Resisted   With tubing around left ankle, anchor behind, bring leg forward, keeping knee straight. Repeat __20__ times per set. Do __2__ sets per session.  Strengthening: Hip Extension - Resisted   With tubing around right ankle, face anchor and pull leg straight back. Repeat __10__ times per set. Do __2__ sets per session.   Mikle Bosworth, PTA 06/23/16 2:08 PM  [facility] [address] [place], [place] [address] Phone # [phone] Fax [phone]

## 2016-06-26 ENCOUNTER — Ambulatory Visit: Payer: Medicare Other | Admitting: Physical Therapy

## 2016-06-26 ENCOUNTER — Encounter: Payer: Self-pay | Admitting: Physical Therapy

## 2016-06-26 DIAGNOSIS — R262 Difficulty in walking, not elsewhere classified: Secondary | ICD-10-CM

## 2016-06-26 DIAGNOSIS — M6281 Muscle weakness (generalized): Secondary | ICD-10-CM

## 2016-06-26 NOTE — Therapy (Signed)
[facility] [address] [place], [place], [address] Phone: [phone]   Fax:  [phone]  Physical Therapy Treatment  Patient Details  Name: Eric George MRN: [id number] Date of Birth: [birth year] Referring Provider: Eulas Post, MD  Encounter Date: 06/26/2016      PT End of Session - 06/26/16 1059    Visit Number 8   Number of Visits 10   Date for PT Re-Evaluation 07/21/16   PT Start Time 1056   PT Stop Time 1138   PT Time Calculation (min) 42 min   Activity Tolerance Patient tolerated treatment well   Behavior During Therapy [facility] for tasks assessed/performed      Past Medical History:  Diagnosis Date  . Bowel obstruction   . CAP (community acquired pneumonia)   . 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 GERD (gastroesophageal reflux disease)   . Hydrocephalus   . Hyperlipidemia   . Hypertension   . 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 Stroke ([place])   . TBI (traumatic brain injury) [facility])     Past Surgical History:  Procedure Laterality Date  . BRAIN SURGERY    . HERNIA REPAIR    . PROSTATECTOMY    . TEE WITHOUT CARDIOVERSION  03/22/2012   Procedure: TRANSESOPHAGEAL ECHOCARDIOGRAM (TEE);  Surgeon: [name], MD;  Location: [facility] ENDOSCOPY;  Service: Cardiovascular;  Laterality: N/A;    There were no vitals filed for this visit.      Subjective Assessment - 06/26/16 1058    Subjective Pt reports doing well today, continues to have soreness in neck. Has been comliant with new HEP.   Patient is accompained by: Family member   Pertinent History hearing impairments and memory deficits   Limitations Walking   How long can you walk comfortably? about 1 mile x 2/ day   Patient Stated Goals overall more strength and better balance   Currently in Pain? Yes   Pain Score 3    Pain Location Neck   Pain Orientation Left   Pain Descriptors /  Indicators Tightness;Sore   Pain Type Chronic pain   Pain Onset More than a month ago                         [facility] Adult PT Treatment/Exercise - 06/26/16 0001      Knee/Hip Exercises: Stretches   Active Hamstring Stretch Both;2 reps;10 seconds  verbal and tactile cues to perform   Piriformis Stretch Both;2 reps;10 seconds  verbal and tactile cues to perform     Knee/Hip Exercises: Aerobic   Nustep L3 x 7 minutes  Therapist present to discuss treatment     Knee/Hip Exercises: Standing   Hip Flexion Stengthening;Both;2 sets;10 reps   Hip Abduction Stengthening;Both;2 sets;10 reps   Hip Extension Stengthening;Both;2 sets;10 reps   Forward Step Up Right;Left;10 reps;Step Height: 6"   Walking with Sports Cord #20 forward/ #15 backward  x10 each CGA; #10 side/side     Knee/Hip Exercises: Seated   Long Arc Quad Strengthening;Both;2 sets;10 reps;Weights   Long Arc Quad Weight 5 lbs.   Sit to Sand 2 sets;without UE support;10 reps  sitting on 2 foam mats to elevate surface     Shoulder Exercises: Standing   Horizontal ABduction Strengthening;Both;20 reps;Theraband   Theraband Level (Shoulder Horizontal ABduction) Level 2 (Red)   Extension Strengthening;Both;10 reps  #  89   Row Strengthening;Both;10 reps  #25                  PT Short Term Goals - 06/23/16 1403      PT SHORT TERM GOAL #1   Title perform Berg Balance assessment   Baseline 46 baseline Moderate Fall Risk   Time 4   Period Weeks   Status Achieved     PT SHORT TERM GOAL #3   Title 5 x sit to stand < or = to 33 seconds for reduced risk of falls   Time 4     PT SHORT TERM GOAL #4   Title demonstrate heel strike 50% of the time for improved gait   Time 4   Period Weeks   Status Achieved           PT Long Term Goals - 06/23/16 1438      PT LONG TERM GOAL #1   Title independent with advanced HEP   Time 8   Period Weeks   Status On-going     PT LONG TERM GOAL #3   Title  reports experiencing 50% less neck and knee pain due to improved gait and posture   Time 8   Period Weeks   Status On-going               Plan - 06/26/16 1138    Clinical Impression Statement Pt able to tolerate all standing exercises well. Did well with walking with sports cord needing CGA for safety. Pt continue to have weakness globally with poor posture. Pt will continue to benefit from skilled therapy for strengthening and balance.    Rehab Potential Excellent   Clinical Impairments Affecting Rehab Potential memory impairments   PT Frequency 2x / week   PT Duration 8 weeks   PT Treatment/Interventions ADLs/Self Care Home Management;Cryotherapy;Electrical Stimulation;Moist Heat;Traction;Therapeutic activities;Therapeutic exercise;Neuromuscular re-education;Patient/family education;Manual techniques   PT Next Visit Plan Postural strengthening, LE strength, flexibility   Consulted and Agree with Plan of Care Patient;Family member/caregiver   Family Member Consulted wife      Patient will benefit from skilled therapeutic intervention in order to improve the following deficits and impairments:  Abnormal gait, Decreased strength, Difficulty walking, Increased muscle spasms, Postural dysfunction, Pain  Visit Diagnosis: Difficulty in walking, not elsewhere classified  Muscle weakness (generalized)     Problem List Patient Active Problem List   Diagnosis Date Noted  . Premature atrial contractions 05/11/2015  . Sepsis ([place]) 04/29/2015  . History of small bowel obstruction 04/29/2015  . History of traumatic brain injury 04/29/2015  . Acute respiratory failure ([place]) 04/29/2015  . CAP (community acquired pneumonia)   . Unspecified cerebral artery occlusion with cerebral infarction 08/25/2012  . Aphasia 08/25/2012  . Macular degeneration 04/25/2012  . CVA (cerebral infarction) 03/20/2012  . Small bowel obstruction 09/12/2011  . Nausea & vomiting 09/12/2011  . Memory loss  04/01/2010  . 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 SKIN LESION 10/24/2009  . NEOPLASM, SKIN, UNCERTAIN BEHAVIOR [id number]  . DYSGEUSIA 10/10/2009  . Hyperlipidemia 07/20/2008  . DEPRESSION 07/20/2008  . Essential hypertension 07/20/2008  . RHINITIS 07/20/2008  . COLONIC POLYPS, HX OF 07/20/2008  . GERD 07/18/2008  . PROSTATE CANCER, HX OF 07/18/2008    Mikle Bosworth PTA 06/26/2016, 11:41 AM  [facility] [address] [place], [place], [address] Phone: [phone]   Fax:  [phone]  Name: Eric George MRN: [id number] Date of  Birth: [birth year]

## 2016-06-29 DIAGNOSIS — H25043 Posterior subcapsular polar age-related cataract, bilateral: Secondary | ICD-10-CM | POA: Diagnosis not present

## 2016-06-29 DIAGNOSIS — H35033 Hypertensive retinopathy, bilateral: Secondary | ICD-10-CM | POA: Diagnosis not present

## 2016-06-29 DIAGNOSIS — H2513 Age-related nuclear cataract, bilateral: Secondary | ICD-10-CM | POA: Diagnosis not present

## 2016-06-29 DIAGNOSIS — H40013 Open angle with borderline findings, low risk, bilateral: Secondary | ICD-10-CM | POA: Diagnosis not present

## 2016-06-29 DIAGNOSIS — H2512 Age-related nuclear cataract, left eye: Secondary | ICD-10-CM | POA: Diagnosis not present

## 2016-06-29 DIAGNOSIS — H25013 Cortical age-related cataract, bilateral: Secondary | ICD-10-CM | POA: Diagnosis not present

## 2016-06-29 LAB — HM DIABETES EYE EXAM

## 2016-06-30 ENCOUNTER — Ambulatory Visit: Payer: Medicare Other | Admitting: Physical Therapy

## 2016-06-30 ENCOUNTER — Telehealth: Payer: Self-pay | Admitting: Physical Therapy

## 2016-06-30 ENCOUNTER — Encounter: Payer: Self-pay | Admitting: Family Medicine

## 2016-06-30 ENCOUNTER — Encounter: Payer: Self-pay | Admitting: Physical Therapy

## 2016-06-30 DIAGNOSIS — R262 Difficulty in walking, not elsewhere classified: Secondary | ICD-10-CM | POA: Diagnosis not present

## 2016-06-30 DIAGNOSIS — M6281 Muscle weakness (generalized): Secondary | ICD-10-CM

## 2016-06-30 NOTE — Telephone Encounter (Signed)
No phone call made 

## 2016-06-30 NOTE — Therapy (Signed)
[facility] [address], [place], [place], [address] Phone: [phone]   Fax:  [phone]  Physical Therapy Treatment  Patient Details  Name: Eric George MRN: [id number] Date of Birth: 05-25-1930 Referring Provider: Eulas Post, MD  Encounter Date: 06/30/2016      PT End of Session - 06/30/16 1409    Visit Number 9   Number of Visits 19   Date for PT Re-Evaluation 07/21/16   Authorization Type kx at 15 visits   PT Start Time 1401   PT Stop Time 1458   PT Time Calculation (min) 57 min   Activity Tolerance Patient tolerated treatment well   Behavior During Therapy [facility] for tasks assessed/performed      Past Medical History:  Diagnosis Date  . Bowel obstruction   . CAP (community acquired pneumonia)   . 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 GERD (gastroesophageal reflux disease)   . Hydrocephalus   . Hyperlipidemia   . Hypertension   . 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 Stroke ([place])   . TBI (traumatic brain injury) [facility])     Past Surgical History:  Procedure Laterality Date  . BRAIN SURGERY    . HERNIA REPAIR    . PROSTATECTOMY    . TEE WITHOUT CARDIOVERSION  03/22/2012   Procedure: TRANSESOPHAGEAL ECHOCARDIOGRAM (TEE);  Surgeon: [name], MD;  Location: [facility] ENDOSCOPY;  Service: Cardiovascular;  Laterality: N/A;    There were no vitals filed for this visit.      Subjective Assessment - 06/30/16 1407    Subjective Reprots sore all over today.  Wife reports that yesterday was getting tested for cataracts and the position he was in was straining. Denies pain currently because took some medicine prior to coming.  Pt and wife feel he has improved and is steadier when walking and able to walk faster when he goes for his daily walks.   Patient is accompained by: Family member  wife   Pertinent History hearing impairments and memory  deficits   Limitations Walking   Patient Stated Goals overall more strength and better balance   Currently in Pain? No/denies  denies pain but states hurts more when moving it            [facility] PT Assessment - 06/30/16 0001      Standardized Balance Assessment   Five times sit to stand comments  20 sec - no UE support     Timed Up and Go Test   TUG Normal TUG  13   Normal TUG (seconds) 13                     [facility] Adult PT Treatment/Exercise - 06/30/16 0001      Knee/Hip Exercises: Aerobic   Nustep L3 x 8 minutes  Therapist present to discuss treatment     Moist Heat Therapy   Number Minutes Moist Heat 15 Minutes   Moist Heat Location Cervical     Electrical Stimulation   Electrical Stimulation Location neck   Electrical Stimulation Action IFC   Electrical Stimulation Parameters 15 min; to tolerance   Electrical Stimulation Goals Pain     Manual Therapy   Manual Therapy Manual Traction   Manual therapy comments supine   Soft tissue mobilization cerivcal paraspinals, upper traps, suboccipitals   Manual Traction 3 min hold with myofascial release  PT Short Term Goals - 07/25/16 1745      PT SHORT TERM GOAL #1   Title perform Berg Balance assessment   Baseline 46 baseline Moderate Fall Risk   Time 4   Period Weeks   Status Achieved     PT SHORT TERM GOAL #2   Title independent with initial HEP   Time 4   Period Weeks   Status Achieved     PT SHORT TERM GOAL #3   Title 5 x sit to stand < or = to 33 seconds for reduced risk of falls   Baseline 20 sec    Time 4   Period Weeks   Status Achieved     PT SHORT TERM GOAL #4   Title demonstrate heel strike 50% of the time for improved gait   Time 4   Period Weeks   Status Achieved           PT Long Term Goals - 07/25/16 1745      PT LONG TERM GOAL #1   Title independent with advanced HEP   Time 8   Period Weeks   Status On-going     PT LONG TERM GOAL #2    Title 5 x sit to stand < or = to 27 sec for significant reduction in fall risk   Baseline 20 sec   Time 8   Period Weeks   Status Achieved     PT LONG TERM GOAL #3   Title reports experiencing 50% less neck and knee pain due to improved gait and posture   Baseline knee pain improved but neck still bothers him   Time 8   Period Weeks   Status On-going     PT LONG TERM GOAL #4   Title improved TUG to 12 sec or less for reduced fall risk   Baseline 13 sec   Time 8   Period Weeks   Status On-going     PT LONG TERM GOAL #5   Title Improved Berg score to > or = to 49/56 for reduced risk of falls   Time 8   Period Weeks   Status New               Plan - 2016/07/25 1410    Clinical Impression Statement Pateint demonstrates improved TUG and 5xsit to stand times demonstrating reduced risk of falls and greater Le strength.  Patient continus to have LE weakness, poor posture and neck pain.  Patient will benefit from continued PT as he is showing progress but still has these impariemnts that need to be addressed.   Rehab Potential Excellent   PT Frequency 2x / week   PT Duration 8 weeks   PT Treatment/Interventions ADLs/Self Care Home Management;Cryotherapy;Electrical Stimulation;Moist Heat;Traction;Therapeutic activities;Therapeutic exercise;Neuromuscular re-education;Patient/family education;Manual techniques   PT Next Visit Plan Postural strengthening, LE strength, flexibility, manual and stim/moist heat to cervical as needed (f/u on response to treatment)   Consulted and Agree with Plan of Care Patient;Family member/caregiver   Family Member Consulted wife      Patient will benefit from skilled therapeutic intervention in order to improve the following deficits and impairments:  Abnormal gait, Decreased strength, Difficulty walking, Increased muscle spasms, Postural dysfunction, Pain  Visit Diagnosis: Difficulty in walking, not elsewhere classified  Muscle weakness  (generalized)       G-Codes - 07-25-16 1736    Functional Assessment Tool Used (Outpatient Only) TUG, 5xsit to stand, clinical reasoning   Functional Limitation Mobility:  Walking and moving around   Mobility: Walking and Moving Around Current Status [phone]) At least 40 percent but less than 60 percent impaired, limited or restricted   Mobility: Walking and Moving Around Goal Status [phone]) At least 40 percent but less than 60 percent impaired, limited or restricted      Problem List Patient Active Problem List   Diagnosis Date Noted  . Premature atrial contractions 05/11/2015  . Sepsis ([place]) 04/29/2015  . History of small bowel obstruction 04/29/2015  . History of traumatic brain injury 04/29/2015  . Acute respiratory failure ([place]) 04/29/2015  . CAP (community acquired pneumonia)   . Unspecified cerebral artery occlusion with cerebral infarction 08/25/2012  . Aphasia 08/25/2012  . Macular degeneration 04/25/2012  . CVA (cerebral infarction) 03/20/2012  . Small bowel obstruction 09/12/2011  . Nausea & vomiting 09/12/2011  . Memory loss 04/01/2010  . 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 SKIN LESION 10/24/2009  . NEOPLASM, SKIN, UNCERTAIN BEHAVIOR 123456  . DYSGEUSIA 10/10/2009  . Hyperlipidemia 07/20/2008  . DEPRESSION 07/20/2008  . Essential hypertension 07/20/2008  . RHINITIS 07/20/2008  . COLONIC POLYPS, HX OF 07/20/2008  . GERD 07/18/2008  . PROSTATE CANCER, HX OF 07/18/2008    Zannie Cove, PT 06/30/2016, 5:49 PM  [facility] [address] [place], [place], [address] Phone: [phone]   Fax:  [phone]  Name: Eric George MRN: [id number] Date of Birth: 1930-07-22

## 2016-06-30 NOTE — Patient Instructions (Signed)
Neck Pain Management:  1. Tennis balls in pillow case under base of skull with pillows: 5-10 minutes  2. [facility] [address], [place], [place] [address] Phone # [phone] Fax [phone]

## 2016-07-03 ENCOUNTER — Encounter: Payer: Self-pay | Admitting: Physical Therapy

## 2016-07-03 ENCOUNTER — Ambulatory Visit: Payer: Medicare Other | Attending: Family Medicine | Admitting: Physical Therapy

## 2016-07-03 DIAGNOSIS — M6281 Muscle weakness (generalized): Secondary | ICD-10-CM | POA: Diagnosis not present

## 2016-07-03 DIAGNOSIS — R262 Difficulty in walking, not elsewhere classified: Secondary | ICD-10-CM | POA: Diagnosis not present

## 2016-07-03 NOTE — Therapy (Signed)
[facility] [address] [place], [place], [address] Phone: [phone]   Fax:  [phone]  Physical Therapy Treatment  Patient Details  Name: Eric George MRN: [id number] Date of Birth: 12/01/1930 Referring Provider: Eulas Post, MD  Encounter Date: 07/03/2016      PT End of Session - 07/03/16 1059    Visit Number 10   Number of Visits 19   Date for PT Re-Evaluation 07/21/16   Authorization Type kx at 15 visits   PT Start Time 1055   PT Stop Time 1136   PT Time Calculation (min) 41 min   Activity Tolerance Patient tolerated treatment well   Behavior During Therapy [facility] for tasks assessed/performed      Past Medical History:  Diagnosis Date  . Bowel obstruction   . CAP (community acquired pneumonia)   . 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 GERD (gastroesophageal reflux disease)   . Hydrocephalus   . Hyperlipidemia   . Hypertension   . 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 Stroke ([place])   . TBI (traumatic brain injury) [facility])     Past Surgical History:  Procedure Laterality Date  . BRAIN SURGERY    . HERNIA REPAIR    . PROSTATECTOMY    . TEE WITHOUT CARDIOVERSION  03/22/2012   Procedure: TRANSESOPHAGEAL ECHOCARDIOGRAM (TEE);  Surgeon: [name], MD;  Location: [facility] ENDOSCOPY;  Service: Cardiovascular;  Laterality: N/A;    There were no vitals filed for this visit.      Subjective Assessment - 07/03/16 1057    Subjective Feeling ok today. Wife reports patient said he was feling sore all over this morning. Doing a little better now.   Patient is accompained by: Family member   Pertinent History hearing impairments and memory deficits   Limitations Walking   How long can you walk comfortably? about 1 mile x 2/ day   Currently in Pain? Yes   Pain Score 3    Pain Location Neck   Pain Orientation Left   Pain Descriptors / Indicators  Tightness;Sore   Pain Type Chronic pain                         [facility] Adult PT Treatment/Exercise - 07/03/16 0001      Knee/Hip Exercises: Stretches   Active Hamstring Stretch Both;2 reps;10 seconds  verbal and tactile cues to perform     Knee/Hip Exercises: Aerobic   Nustep L3 x 8 minutes  Therapist present to discuss treatment     Knee/Hip Exercises: Machines for Strengthening   Total Gym Leg Press Seat 8 #95 Bil 3x10     Knee/Hip Exercises: Standing   Heel Raises --   Forward Step Up --  Alternating tapping   Walking with Sports Cord #20 forward/ #15 backward  x10 each CGA; #10 side/side     Knee/Hip Exercises: Seated   Sit to Sand 2 sets;without UE support;10 reps  sitting on 1 foam mats to elevate surface     Knee/Hip Exercises: Supine   Bridges Strengthening;Both;1 set;10 reps   Bridges with [employer] Strengthening;Both;1 set;20 reps     Shoulder Exercises: Supine   Other Supine Exercises Shoulder extension  Hooklying Green x20     Shoulder Exercises: Standing   Horizontal ABduction Strengthening;Both;20 reps;Theraband   Theraband Level (Shoulder Horizontal ABduction) Level 2 (Red)  Other Standing Exercises D2 extension 2x10  Red band                  PT Short Term Goals - 06/30/16 1745      PT SHORT TERM GOAL #1   Title perform Berg Balance assessment   Baseline 46 baseline Moderate Fall Risk   Time 4   Period Weeks   Status Achieved     PT SHORT TERM GOAL #2   Title independent with initial HEP   Time 4   Period Weeks   Status Achieved     PT SHORT TERM GOAL #3   Title 5 x sit to stand < or = to 33 seconds for reduced risk of falls   Baseline 20 sec    Time 4   Period Weeks   Status Achieved     PT SHORT TERM GOAL #4   Title demonstrate heel strike 50% of the time for improved gait   Time 4   Period Weeks   Status Achieved           PT Long Term Goals - 06/30/16 1745      PT LONG TERM GOAL #1    Title independent with advanced HEP   Time 8   Period Weeks   Status On-going     PT LONG TERM GOAL #2   Title 5 x sit to stand < or = to 27 sec for significant reduction in fall risk   Baseline 20 sec   Time 8   Period Weeks   Status Achieved     PT LONG TERM GOAL #3   Title reports experiencing 50% less neck and knee pain due to improved gait and posture   Baseline knee pain improved but neck still bothers him   Time 8   Period Weeks   Status On-going     PT LONG TERM GOAL #4   Title improved TUG to 12 sec or less for reduced fall risk   Baseline 13 sec   Time 8   Period Weeks   Status On-going     PT LONG TERM GOAL #5   Title Improved Berg score to > or = to 49/56 for reduced risk of falls   Time 8   Period Weeks   Status New               Plan - 07/03/16 1102    Clinical Impression Statement Pt continue to have decreased postre and unsteady gait. Pt continues to report neck pain. Pt able to tolerate all exercsies well with no increase in pain. Did very well with toe taps at stairs needing no HHA and able to alternate without loosing balance. Discussed with patient (wife not present) about having good posture and adjusting the TV at eye level or higher. Pt will continue to benefit from skilled therapy for LE strengthening and posture control.    Rehab Potential Excellent   Clinical Impairments Affecting Rehab Potential memory impairments   PT Frequency 2x / week   PT Duration 8 weeks   PT Treatment/Interventions ADLs/Self Care Home Management;Cryotherapy;Electrical Stimulation;Moist Heat;Traction;Therapeutic activities;Therapeutic exercise;Neuromuscular re-education;Patient/family education;Manual techniques   PT Next Visit Plan Posture strengthening, balance   Consulted and Agree with Plan of Care Patient;Family member/caregiver   Family Member Consulted wife      Patient will benefit from skilled therapeutic intervention in order to improve the following  deficits and impairments:  Abnormal gait, Decreased strength, Difficulty walking, Increased muscle  spasms, Postural dysfunction, Pain  Visit Diagnosis: Difficulty in walking, not elsewhere classified  Muscle weakness (generalized)     Problem List Patient Active Problem List   Diagnosis Date Noted  . Premature atrial contractions 05/11/2015  . Sepsis ([place]) 04/29/2015  . History of small bowel obstruction 04/29/2015  . History of traumatic brain injury 04/29/2015  . Acute respiratory failure ([place]) 04/29/2015  . CAP (community acquired pneumonia)   . Unspecified cerebral artery occlusion with cerebral infarction 08/25/2012  . Aphasia 08/25/2012  . Macular degeneration 04/25/2012  . CVA (cerebral infarction) 03/20/2012  . Small bowel obstruction 09/12/2011  . Nausea & vomiting 09/12/2011  . Memory loss 04/01/2010  . 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 SKIN LESION 10/24/2009  . NEOPLASM, SKIN, UNCERTAIN BEHAVIOR 123456  . DYSGEUSIA 10/10/2009  . Hyperlipidemia 07/20/2008  . DEPRESSION 07/20/2008  . Essential hypertension 07/20/2008  . RHINITIS 07/20/2008  . COLONIC POLYPS, HX OF 07/20/2008  . GERD 07/18/2008  . PROSTATE CANCER, HX OF 07/18/2008    Mikle Bosworth PTA 07/03/2016, 11:48 AM  [facility] [address] [place], [place], [address] Phone: [phone]   Fax:  [phone]  Name: Eric George MRN: [id number] Date of Birth: 02-08-1931

## 2016-07-07 ENCOUNTER — Ambulatory Visit: Payer: Medicare Other | Admitting: Physical Therapy

## 2016-07-07 ENCOUNTER — Encounter: Payer: Self-pay | Admitting: Physical Therapy

## 2016-07-07 NOTE — Therapy (Signed)
[facility] [address] [place], [place], [address] Phone: [phone]   Fax:  [phone]  Physical Therapy Treatment  Patient Details  Name: Eric George MRN: [id number] Date of Birth: 1930-06-26 Referring Provider: Eulas Post, MD  Encounter Date: 07/07/2016      PT End of Session - 07/07/16 1536    Visit Number --   Number of Visits 19   Date for PT Re-Evaluation 07/21/16   Authorization Type kx at 15 visits   PT Start Time 1536   PT Stop Time 1540   PT Time Calculation (min) 4 min   Activity Tolerance --   Behavior During Therapy --      Past Medical History:  Diagnosis Date  . Bowel obstruction   . CAP (community acquired pneumonia)   . 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 GERD (gastroesophageal reflux disease)   . Hydrocephalus   . Hyperlipidemia   . Hypertension   . 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 Stroke ([place])   . TBI (traumatic brain injury) [facility])     Past Surgical History:  Procedure Laterality Date  . BRAIN SURGERY    . HERNIA REPAIR    . PROSTATECTOMY    . TEE WITHOUT CARDIOVERSION  03/22/2012   Procedure: TRANSESOPHAGEAL ECHOCARDIOGRAM (TEE);  Surgeon: [name], MD;  Location: [facility] ENDOSCOPY;  Service: Cardiovascular;  Laterality: N/A;    There were no vitals filed for this visit.      Subjective Assessment - 07/07/16 1619    Subjective Patient arrived with spouse but patient's wife had to get to another appointment so they decided they would have to leave                                   PT Short Term Goals - 06/30/16 1745      PT SHORT TERM GOAL #1   Title perform Berg Balance assessment   Baseline 46 baseline Moderate Fall Risk   Time 4   Period Weeks   Status Achieved     PT SHORT TERM GOAL #2   Title independent with initial HEP   Time 4   Period Weeks   Status  Achieved     PT SHORT TERM GOAL #3   Title 5 x sit to stand < or = to 33 seconds for reduced risk of falls   Baseline 20 sec    Time 4   Period Weeks   Status Achieved     PT SHORT TERM GOAL #4   Title demonstrate heel strike 50% of the time for improved gait   Time 4   Period Weeks   Status Achieved           PT Long Term Goals - 06/30/16 1745      PT LONG TERM GOAL #1   Title independent with advanced HEP   Time 8   Period Weeks   Status On-going     PT LONG TERM GOAL #2   Title 5 x sit to stand < or = to 27 sec for significant reduction in fall risk   Baseline 20 sec   Time 8   Period Weeks   Status Achieved     PT LONG TERM GOAL #3   Title reports experiencing 50% less neck  and knee pain due to improved gait and posture   Baseline knee pain improved but neck still bothers him   Time 8   Period Weeks   Status On-going     PT LONG TERM GOAL #4   Title improved TUG to 12 sec or less for reduced fall risk   Baseline 13 sec   Time 8   Period Weeks   Status On-going     PT LONG TERM GOAL #5   Title Improved Berg score to > or = to 49/56 for reduced risk of falls   Time 8   Period Weeks   Status New             Patient will benefit from skilled therapeutic intervention in order to improve the following deficits and impairments:     Visit Diagnosis: Difficulty in walking, not elsewhere classified  Muscle weakness (generalized)     Problem List Patient Active Problem List   Diagnosis Date Noted  . Premature atrial contractions 05/11/2015  . Sepsis ([place]) 04/29/2015  . History of small bowel obstruction 04/29/2015  . History of traumatic brain injury 04/29/2015  . Acute respiratory failure ([place]) 04/29/2015  . CAP (community acquired pneumonia)   . Unspecified cerebral artery occlusion with cerebral infarction 08/25/2012  . Aphasia 08/25/2012  . Macular degeneration 04/25/2012  . CVA (cerebral infarction) 03/20/2012  . Small bowel  obstruction 09/12/2011  . Nausea & vomiting 09/12/2011  . Memory loss 04/01/2010  . 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 SKIN LESION 10/24/2009  . NEOPLASM, SKIN, UNCERTAIN BEHAVIOR 123456  . DYSGEUSIA 10/10/2009  . Hyperlipidemia 07/20/2008  . DEPRESSION 07/20/2008  . Essential hypertension 07/20/2008  . RHINITIS 07/20/2008  . COLONIC POLYPS, HX OF 07/20/2008  . GERD 07/18/2008  . PROSTATE CANCER, HX OF 07/18/2008    Zannie Cove, PT 07/07/2016, 5:36 PM  [facility] [address] [place], [place], [address] Phone: [phone]   Fax:  [phone]  Name: Eric George MRN: [id number] Date of Birth: 1930/09/24

## 2016-07-08 ENCOUNTER — Encounter: Payer: Self-pay | Admitting: Family Medicine

## 2016-07-08 ENCOUNTER — Ambulatory Visit (INDEPENDENT_AMBULATORY_CARE_PROVIDER_SITE_OTHER): Payer: Medicare Other | Admitting: Family Medicine

## 2016-07-08 VITALS — BP 124/78 | HR 74 | Temp 97.3°F | Wt 191.2 lb

## 2016-07-08 DIAGNOSIS — M150 Primary generalized (osteo)arthritis: Secondary | ICD-10-CM

## 2016-07-08 DIAGNOSIS — Z9181 History of falling: Secondary | ICD-10-CM

## 2016-07-08 DIAGNOSIS — M8949 Other hypertrophic osteoarthropathy, multiple sites: Secondary | ICD-10-CM

## 2016-07-08 DIAGNOSIS — M159 Polyosteoarthritis, unspecified: Secondary | ICD-10-CM

## 2016-07-08 DIAGNOSIS — I10 Essential (primary) hypertension: Secondary | ICD-10-CM | POA: Diagnosis not present

## 2016-07-08 DIAGNOSIS — E785 Hyperlipidemia, unspecified: Secondary | ICD-10-CM | POA: Diagnosis not present

## 2016-07-08 HISTORY — DX: Polyosteoarthritis, unspecified: M15.9

## 2016-07-08 LAB — BASIC METABOLIC PANEL
BUN: 21 mg/dL (ref 6–23)
CO2: 26 mEq/L (ref 19–32)
Calcium: 9.5 mg/dL (ref 8.4–10.5)
Chloride: 106 mEq/L (ref 96–112)
Creatinine, Ser: 0.93 mg/dL (ref 0.40–1.50)
GFR: 82.34 mL/min (ref >60.00)
Glucose, Bld: 64 mg/dL — ABNORMAL LOW (ref 70–99)
Potassium: 4.6 mEq/L (ref 3.5–5.1)
Sodium: 138 mEq/L (ref 135–145)

## 2016-07-08 LAB — LIPID PANEL
Cholesterol: 154 mg/dL (ref 0–200)
HDL: 35.9 mg/dL — ABNORMAL LOW (ref >39.00)
LDL Cholesterol: 84 mg/dL (ref 0–99)
NonHDL: 117.78
Total CHOL/HDL Ratio: 4
Triglycerides: 170 mg/dL — ABNORMAL HIGH (ref 0.0–149.0)
VLDL: 34 mg/dL (ref 0.0–40.0)

## 2016-07-08 LAB — HEPATIC FUNCTION PANEL
ALT: 16 U/L (ref 0–53)
AST: 14 U/L (ref 0–37)
Albumin: 4.1 g/dL (ref 3.5–5.2)
Alkaline Phosphatase: 83 U/L (ref 39–117)
Bilirubin, Direct: 0.1 mg/dL (ref 0.0–0.3)
Total Bilirubin: 0.4 mg/dL (ref 0.2–1.2)
Total Protein: 6.2 g/dL (ref 6.0–8.3)

## 2016-07-08 NOTE — Progress Notes (Signed)
Subjective:     Patient ID: Eric George, male   DOB: 1930/05/11, 83 y.o.   MRN: [id number]  HPI   Patient seen for medical follow-up. Recent problems with left knee pain. X-rays revealed relatively mild arthritic change. He has continued problems with his neck and is getting regular physical therapy. They're using Tylenol which helps. They did not try glucosamine yet. No visible swelling. No consistent radiculitis symptoms  History of dyslipidemia on atorvastatin. No myalgias. No history of cardiac issues. He has hypertension treated with Avapro. He had some recent dry cough. No hemoptysis. No appetite or weight changes. No fevers or chills.  He has history of recurrent small bowel obstruction but has done well now for several years. He has GERD which is followed by GI and treated with Zegerid.  No dysphagia.    Past Medical History:  Diagnosis Date  . Bowel obstruction   . CAP (community acquired pneumonia)   . 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 GERD (gastroesophageal reflux disease)   . Hydrocephalus   . Hyperlipidemia   . Hypertension   . 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 Stroke ([place])   . TBI (traumatic brain injury) [facility])    Past Surgical History:  Procedure Laterality Date  . BRAIN SURGERY    . HERNIA REPAIR    . PROSTATECTOMY    . TEE WITHOUT CARDIOVERSION  03/22/2012   Procedure: TRANSESOPHAGEAL ECHOCARDIOGRAM (TEE);  Surgeon: [name], MD;  Location: [facility] ENDOSCOPY;  Service: Cardiovascular;  Laterality: N/A;    reports that he quit smoking about 49 years ago. His smoking use included Cigarettes. He has a 7.50 pack-year smoking history. He has never used smokeless tobacco. He reports that he does not drink alcohol or use drugs. family history includes Heart disease in his sister. Allergies  Allergen Reactions  . Penicillins Rash       Review of Systems  Constitutional: Negative for fatigue and  unexpected weight change.  Eyes: Negative for visual disturbance.  Respiratory: Negative for cough, chest tightness and shortness of breath.   Cardiovascular: Negative for chest pain, palpitations and leg swelling.  Endocrine: Negative for polydipsia and polyuria.  Genitourinary: Negative for dysuria.  Neurological: Negative for dizziness, syncope, weakness, light-headedness and headaches.       Objective:   Physical Exam  Constitutional: He is oriented to person, place, and time. He appears well-developed and well-nourished.  Neck: Neck supple. No thyromegaly present.  Cardiovascular: Normal rate and regular rhythm.   Pulmonary/Chest: Effort normal and breath sounds normal. No respiratory distress. He has no wheezes. He has no rales.  Abdominal: Soft. He exhibits no mass. There is no tenderness. There is no rebound and no guarding.  Musculoskeletal: He exhibits no edema.  Lymphadenopathy:    He has no cervical adenopathy.  Neurological: He is alert and oriented to person, place, and time. No cranial nerve deficit.       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

## 2016-07-08 NOTE — Progress Notes (Signed)
Pre visit review using our clinic review tool, if applicable. No additional management support is needed unless otherwise documented below in the visit note. 

## 2016-07-08 NOTE — Patient Instructions (Signed)
Consider OTC glucosamine to see if that helps neck arthritis pain Consider Mucinex for cough.

## 2016-07-10 ENCOUNTER — Encounter: Payer: Medicare Other | Admitting: Physical Therapy

## 2016-07-14 ENCOUNTER — Ambulatory Visit: Payer: Medicare Other | Admitting: Physical Therapy

## 2016-07-14 ENCOUNTER — Telehealth: Payer: Self-pay

## 2016-07-14 ENCOUNTER — Encounter: Payer: Self-pay | Admitting: Physical Therapy

## 2016-07-14 DIAGNOSIS — M6281 Muscle weakness (generalized): Secondary | ICD-10-CM | POA: Diagnosis not present

## 2016-07-14 DIAGNOSIS — R262 Difficulty in walking, not elsewhere classified: Secondary | ICD-10-CM | POA: Diagnosis not present

## 2016-07-14 NOTE — Telephone Encounter (Signed)
Call to schedule AWV Left VM for call back to my direct line; Seen Dr. Elease Hashimoto recently , so can schedule out 3 to 4 months as desired

## 2016-07-14 NOTE — Therapy (Signed)
[facility] [address] [place], [place], [address] Phone: [phone]   Fax:  [phone]  Physical Therapy Treatment  Patient Details  Name: Eric George MRN: [id number] Date of Birth: 1931/02/20 Referring Provider: Eulas Post, MD  Encounter Date: 07/14/2016      PT End of Session - 07/14/16 1531    Visit Number 11   Number of Visits 19   Date for PT Re-Evaluation 07/21/16   Authorization Type kx at 15 visits   PT Start Time 1425   PT Stop Time 1510   PT Time Calculation (min) 45 min   Activity Tolerance Patient tolerated treatment well   Behavior During Therapy [facility] for tasks assessed/performed      Past Medical History:  Diagnosis Date  . Bowel obstruction   . CAP (community acquired pneumonia)   . 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 GERD (gastroesophageal reflux disease)   . Hydrocephalus   . Hyperlipidemia   . Hypertension   . Osteoarthritis, multiple sites 07/08/2016  . 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 Stroke ([place])   . TBI (traumatic brain injury) [facility])     Past Surgical History:  Procedure Laterality Date  . BRAIN SURGERY    . HERNIA REPAIR    . PROSTATECTOMY    . TEE WITHOUT CARDIOVERSION  03/22/2012   Procedure: TRANSESOPHAGEAL ECHOCARDIOGRAM (TEE);  Surgeon: [name], MD;  Location: [facility] ENDOSCOPY;  Service: Cardiovascular;  Laterality: N/A;    There were no vitals filed for this visit.      Subjective Assessment - 07/14/16 1530    Subjective Pt reports feeling ok, continues to have some neck stiffness. Pt wife states its probabyl how he sits on the couch. Pt wife stated he reports feeling sore and tired in mornings and reports pain while vaccuuming at home.    Patient is accompained by: Family member   Pertinent History hearing impairments and memory deficits   Limitations Walking   How long can you walk  comfortably? about 1 mile x 2/ day   Patient Stated Goals overall more strength and better balance   Currently in Pain? Yes   Pain Score 2    Pain Location Neck   Pain Orientation Left   Pain Descriptors / Indicators Tightness;Sore   Pain Type Chronic pain   Pain Onset More than a month ago            [facility] PT Assessment - 07/14/16 0001      Timed Up and Go Test   TUG Normal TUG  13   Normal TUG (seconds) 15                     [facility] Adult PT Treatment/Exercise - 07/14/16 0001      Knee/Hip Exercises: Stretches   Active Hamstring Stretch Both;2 reps;10 seconds   Piriformis Stretch Both;2 reps;10 seconds     Knee/Hip Exercises: Aerobic   Nustep L3 x 10 minutes  Therapist present to discuss treatment     Knee/Hip Exercises: Machines for Strengthening   Total Gym Leg Press Seat 8 #95 Bil 3x10     Knee/Hip Exercises: Standing   Forward Step Up Right;Left;Step Height: 8";2 sets;5 reps   Walking with Sports Cord #25 forward/ #20 backward  x10 each CGA; #15 side/side   Other Standing Knee Exercises shoulder flexion red band  Focus on not rotating hips or torso     Shoulder Exercises: Standing   Horizontal ABduction Strengthening;Both;20 reps;Theraband   Theraband Level (Shoulder Horizontal ABduction) Level 2 (Red)   Other Standing Exercises D2 extension 2x10  Red band                  PT Short Term Goals - 06/30/16 1745      PT SHORT TERM GOAL #1   Title perform Berg Balance assessment   Baseline 46 baseline Moderate Fall Risk   Time 4   Period Weeks   Status Achieved     PT SHORT TERM GOAL #2   Title independent with initial HEP   Time 4   Period Weeks   Status Achieved     PT SHORT TERM GOAL #3   Title 5 x sit to stand < or = to 33 seconds for reduced risk of falls   Baseline 20 sec    Time 4   Period Weeks   Status Achieved     PT SHORT TERM GOAL #4   Title demonstrate heel strike 50% of the time for improved gait   Time 4    Period Weeks   Status Achieved           PT Long Term Goals - 07/14/16 1532      PT LONG TERM GOAL #3   Title reports experiencing 50% less neck and knee pain due to improved gait and posture   Baseline knee pain improved but neck still bothers him   Time 8   Period Weeks   Status On-going     PT LONG TERM GOAL #4   Title improved TUG to 12 sec or less for reduced fall risk   Baseline 15 seconds no cane   Time 8   Period Weeks   Status On-going     PT LONG TERM GOAL #5   Title Improved Berg score to > or = to 49/56 for reduced risk of falls   Time 8   Period Weeks   Status On-going               Plan - 07/14/16 1640    Clinical Impression Statement Pt having better posture and better balance with walking. Pt continues to have weakness in Bil LE Rt>LT. Pt also more tight in Rt LE than Lt with stretching. Pt's wife mentioned pt having pain in hips and knees with vaccuuming. Practiced anti rotation balance with UE movment and discussed good body mechanics with vaccuuming and sitting to watch TV to decrease LE and neck pain. Pt continues to benefit from skilled therapy for LE strength and balance.    Rehab Potential Excellent   Clinical Impairments Affecting Rehab Potential memory impairments   PT Frequency 2x / week   PT Duration 8 weeks   PT Treatment/Interventions ADLs/Self Care Home Management;Cryotherapy;Electrical Stimulation;Moist Heat;Traction;Therapeutic activities;Therapeutic exercise;Neuromuscular re-education;Patient/family education;Manual techniques   PT Next Visit Plan core strength for ADL's such a vaccuming   Consulted and Agree with Plan of Care Patient;Family member/caregiver   Family Member Consulted wife      Patient will benefit from skilled therapeutic intervention in order to improve the following deficits and impairments:  Abnormal gait, Decreased strength, Difficulty walking, Increased muscle spasms, Postural dysfunction, Pain  Visit  Diagnosis: Difficulty in walking, not elsewhere classified  Muscle weakness (generalized)     Problem List Patient Active Problem List   Diagnosis Date Noted  . Osteoarthritis, multiple  sites 07/08/2016  . Risk for falls 07/08/2016  . Premature atrial contractions 05/11/2015  . Sepsis ([place]) 04/29/2015  . History of small bowel obstruction 04/29/2015  . History of traumatic brain injury 04/29/2015  . Acute respiratory failure ([place]) 04/29/2015  . CAP (community acquired pneumonia)   . Unspecified cerebral artery occlusion with cerebral infarction 08/25/2012  . Aphasia 08/25/2012  . Macular degeneration 04/25/2012  . CVA (cerebral infarction) 03/20/2012  . Small bowel obstruction 09/12/2011  . Nausea & vomiting 09/12/2011  . Memory loss 04/01/2010  . 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 SKIN LESION 10/24/2009  . NEOPLASM, SKIN, UNCERTAIN BEHAVIOR 97/67/3419  . DYSGEUSIA 10/10/2009  . Hyperlipidemia 07/20/2008  . DEPRESSION 07/20/2008  . Essential hypertension 07/20/2008  . RHINITIS 07/20/2008  . COLONIC POLYPS, HX OF 07/20/2008  . GERD 07/18/2008  . PROSTATE CANCER, HX OF 07/18/2008    Mikle Bosworth PTA 07/14/2016, 4:46 PM  [facility] [address] [place], [place], [address] Phone: [phone]   Fax:  [phone]  Name: Eric George MRN: [id number] Date of Birth: June 22, 1930

## 2016-07-16 NOTE — Telephone Encounter (Signed)
Call to Mr. Grewe to schedule AWV and spoke to wife [name]. Stated they had a lot going on and would appreciate another outreach in June.  Seen Dr. Elease Hashimoto in march and Dr. Elease Hashimoto noted another fup in 3 months.  Will make outreach in June

## 2016-07-17 ENCOUNTER — Encounter: Payer: Self-pay | Admitting: Physical Therapy

## 2016-07-17 ENCOUNTER — Ambulatory Visit: Payer: Medicare Other | Admitting: Physical Therapy

## 2016-07-17 DIAGNOSIS — M6281 Muscle weakness (generalized): Secondary | ICD-10-CM | POA: Diagnosis not present

## 2016-07-17 DIAGNOSIS — R262 Difficulty in walking, not elsewhere classified: Secondary | ICD-10-CM | POA: Diagnosis not present

## 2016-07-17 NOTE — Therapy (Signed)
[facility] [address] [place], [place], [address] Phone: [phone]   Fax:  [phone]  Physical Therapy Treatment  Patient Details  Name: Eric George MRN: [id number] Date of Birth: 02-02-1931 Referring Provider: Eulas Post, MD  Encounter Date: 07/17/2016      PT End of Session - 07/17/16 1014    Visit Number 12   Number of Visits 19   Date for PT Re-Evaluation 07/21/16   Authorization Type kx at 15 visits   PT Start Time 8242   PT Stop Time 1059   PT Time Calculation (min) 45 min   Activity Tolerance Patient tolerated treatment well   Behavior During Therapy [facility] for tasks assessed/performed      Past Medical History:  Diagnosis Date  . Bowel obstruction   . CAP (community acquired pneumonia)   . 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 GERD (gastroesophageal reflux disease)   . Hydrocephalus   . Hyperlipidemia   . Hypertension   . Osteoarthritis, multiple sites 07/08/2016  . 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 Stroke ([place])   . TBI (traumatic brain injury) [facility])     Past Surgical History:  Procedure Laterality Date  . BRAIN SURGERY    . HERNIA REPAIR    . PROSTATECTOMY    . TEE WITHOUT CARDIOVERSION  03/22/2012   Procedure: TRANSESOPHAGEAL ECHOCARDIOGRAM (TEE);  Surgeon: [name], MD;  Location: [facility] ENDOSCOPY;  Service: Cardiovascular;  Laterality: N/A;    There were no vitals filed for this visit.      Subjective Assessment - 07/17/16 1055    Subjective Pt wife states he is leaning to the left after getting back from his walk and he takes about three sitting breaks when vacuuming.  Pt states I'm doing okay denies pain.   Patient is accompained by: Family member   Pertinent History hearing impairments and memory deficits   Limitations Walking   How long can you walk comfortably? about 1 mile x 2/ day   Currently in Pain?  No/denies                         [facility] Adult PT Treatment/Exercise - 07/17/16 0001      Knee/Hip Exercises: Stretches   Active Hamstring Stretch Both;2 reps;10 seconds   Piriformis Stretch Both;2 reps;10 seconds   Gastroc Stretch 3 reps;30 seconds     Knee/Hip Exercises: Aerobic   Nustep L3 x 10 minutes  Therapist present to discuss treatment     Knee/Hip Exercises: Machines for Strengthening   Total Gym Leg Press Seat 8 #105 Bil 2x10; single 45# 2x10     Knee/Hip Exercises: Standing   Forward Step Up Right;Left;Step Height: 8";2 sets;5 reps     Knee/Hip Exercises: Supine   Bridges with [employer] Strengthening;Both;1 set;20 reps     Knee/Hip Exercises: Sidelying   Hip ABduction Strengthening;Left;20 reps   Clams 10     Shoulder Exercises: Standing   Horizontal ABduction Strengthening;Both;15 reps;Weights   Theraband Level (Shoulder Horizontal ABduction) --   Horizontal ABduction Weight (lbs) 15   Extension Strengthening;Both;10 reps  #25   Row Strengthening;Both;10 reps  #25; upright and regular row                  PT Short Term Goals - 06/30/16 1745      PT SHORT  TERM GOAL #1   Title perform Berg Balance assessment   Baseline 46 baseline Moderate Fall Risk   Time 4   Period Weeks   Status Achieved     PT SHORT TERM GOAL #2   Title independent with initial HEP   Time 4   Period Weeks   Status Achieved     PT SHORT TERM GOAL #3   Title 5 x sit to stand < or = to 33 seconds for reduced risk of falls   Baseline 20 sec    Time 4   Period Weeks   Status Achieved     PT SHORT TERM GOAL #4   Title demonstrate heel strike 50% of the time for improved gait   Time 4   Period Weeks   Status Achieved           PT Long Term Goals - 07/14/16 1532      PT LONG TERM GOAL #3   Title reports experiencing 50% less neck and knee pain due to improved gait and posture   Baseline knee pain improved but neck still bothers him   Time  8   Period Weeks   Status On-going     PT LONG TERM GOAL #4   Title improved TUG to 12 sec or less for reduced fall risk   Baseline 15 seconds no cane   Time 8   Period Weeks   Status On-going     PT LONG TERM GOAL #5   Title Improved Berg score to > or = to 49/56 for reduced risk of falls   Time 8   Period Weeks   Status On-going               Plan - 07/17/16 1014    Clinical Impression Statement Patient able to increase resistence and difficulty.  He had some pain when doing step ups today so focused on hip strengthening on mat.  Continues to need a lot of verbal and tactile cues for posture, has difficulty not overusing upper traps.  Skilled PT needed for increased strength and improved posture, hip and core strength to improve gait and endurance.   Rehab Potential Excellent   Clinical Impairments Affecting Rehab Potential memory impairments   PT Treatment/Interventions ADLs/Self Care Home Management;Cryotherapy;Electrical Stimulation;Moist Heat;Traction;Therapeutic activities;Therapeutic exercise;Neuromuscular re-education;Patient/family education;Manual techniques   PT Next Visit Plan HEP to prepare for discharge; core strength for ADL's such a vaccuming, hip strength for improved endurance with walking   Consulted and Agree with Plan of Care Patient;Family member/caregiver   Family Member Consulted wife      Patient will benefit from skilled therapeutic intervention in order to improve the following deficits and impairments:  Abnormal gait, Decreased strength, Difficulty walking, Increased muscle spasms, Postural dysfunction, Pain  Visit Diagnosis: Difficulty in walking, not elsewhere classified  Muscle weakness (generalized)     Problem List Patient Active Problem List   Diagnosis Date Noted  . Osteoarthritis, multiple sites 07/08/2016  . Risk for falls 07/08/2016  . Premature atrial contractions 05/11/2015  . Sepsis ([place]) 04/29/2015  . History of small  bowel obstruction 04/29/2015  . History of traumatic brain injury 04/29/2015  . Acute respiratory failure ([place]) 04/29/2015  . CAP (community acquired pneumonia)   . Unspecified cerebral artery occlusion with cerebral infarction 08/25/2012  . Aphasia 08/25/2012  . Macular degeneration 04/25/2012  . CVA (cerebral infarction) 03/20/2012  . Small bowel obstruction 09/12/2011  . Nausea & vomiting 09/12/2011  . Memory loss  04/01/2010  . 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 SKIN LESION 10/24/2009  . NEOPLASM, SKIN, UNCERTAIN BEHAVIOR 69/79/4801  . DYSGEUSIA 10/10/2009  . Hyperlipidemia 07/20/2008  . DEPRESSION 07/20/2008  . Essential hypertension 07/20/2008  . RHINITIS 07/20/2008  . COLONIC POLYPS, HX OF 07/20/2008  . GERD 07/18/2008  . PROSTATE CANCER, HX OF 07/18/2008    Zannie Cove, PT 07/17/2016, 11:05 AM  [facility] [address] [place], [place], [address] Phone: [phone]   Fax:  [phone]  Name: Eric George MRN: [id number] Date of Birth: May 24, 1930

## 2016-07-21 ENCOUNTER — Ambulatory Visit: Payer: Medicare Other | Admitting: Physical Therapy

## 2016-07-21 ENCOUNTER — Encounter: Payer: Self-pay | Admitting: Physical Therapy

## 2016-07-21 DIAGNOSIS — M6281 Muscle weakness (generalized): Secondary | ICD-10-CM

## 2016-07-21 DIAGNOSIS — R262 Difficulty in walking, not elsewhere classified: Secondary | ICD-10-CM

## 2016-07-21 NOTE — Patient Instructions (Signed)
Alternating Step    Take alternating steps as quickly as possible. Repeat ____ times. Do ____ sessions per day.  http://gt2.exer.us/493   Copyright  VHI. All rights reserved.   Ankle Plantar Flexion / Dorsiflexion, Standing    Stand while holding a stable object. Rise up on toes. Then rock back on heels. Hold each position ___ seconds. Repeat ___ times per session. Do ___ sessions per day.  Copyright  VHI. All rights reserved.   Adduction: Hip - Knees Together (Sitting)    Sit with towel roll between knees. Push knees together. Hold for ___ seconds. Rest for ___ seconds. Repeat ___ times. Do ___ times a day.  Copyright  VHI. All rights reserved.   Mikle Bosworth, PTA 07/21/16 4:09 PM  [facility] [address], [place], [place] [address] Phone # [phone] Fax [phone]

## 2016-07-21 NOTE — Therapy (Signed)
[facility] [name], [place], [place], [address] Phone: [phone]   Fax:  [phone]  Physical Therapy Treatment  Patient Details  Name: Eric George MRN: [id number] Date of Birth: [birth year] Referring Provider: Eulas Post, MD  Encounter Date: 07/21/2016      PT End of Session - 07/21/16 1527    Visit Number 13   Number of Visits 19   Date for PT Re-Evaluation 07/21/16   Authorization Type kx at 15 visits   PT Start Time 1526   PT Stop Time 1611   PT Time Calculation (min) 45 min   Activity Tolerance Patient tolerated treatment well   Behavior During Therapy [facility] for tasks assessed/performed      Past Medical History:  Diagnosis Date  . Bowel obstruction   . CAP (community acquired pneumonia)   . 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 GERD (gastroesophageal reflux disease)   . Hydrocephalus   . Hyperlipidemia   . Hypertension   . Osteoarthritis, multiple sites 07/08/2016  . 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 Stroke ([place])   . TBI (traumatic brain injury) [facility])     Past Surgical History:  Procedure Laterality Date  . BRAIN SURGERY    . HERNIA REPAIR    . PROSTATECTOMY    . TEE WITHOUT CARDIOVERSION  03/22/2012   Procedure: TRANSESOPHAGEAL ECHOCARDIOGRAM (TEE);  Surgeon: [name], MD;  Location: [facility] ENDOSCOPY;  Service: Cardiovascular;  Laterality: N/A;    There were no vitals filed for this visit.      Subjective Assessment - 07/21/16 1526    Subjective Pt wife reports no changes, good or bad. Pretty normal. Pt reports knees having been doing pretty good.    Patient is accompained by: Family member   Pertinent History hearing impairments and memory deficits   Limitations Walking   How long can you walk comfortably? about 1 mile x 2/ day   Patient Stated Goals overall more strength and better balance   Currently in Pain?  No/denies   Pain Score 0-No pain                         [facility] Adult PT Treatment/Exercise - 07/21/16 0001      Berg Balance Test   Sit to Stand Able to stand without using hands and stabilize independently   Standing Unsupported Able to stand safely 2 minutes   Sitting with Back Unsupported but Feet Supported on Floor or Stool Able to sit safely and securely 2 minutes   Stand to Sit Sits safely with minimal use of hands   Transfers Able to transfer safely, minor use of hands   Standing Unsupported with Eyes Closed Able to stand 10 seconds safely   Standing Ubsupported with Feet Together Able to place feet together independently and stand 1 minute safely   From Standing, Reach Forward with Outstretched Arm Can reach confidently >25 cm (10")   From Standing Position, Pick up Object from Floor Able to pick up shoe, needs supervision   From Standing Position, Turn to Look Behind Over each Shoulder Looks behind from both sides and weight shifts well   Turn 360 Degrees Able to turn 360 degrees safely in 4 seconds or less   Standing Unsupported, Alternately Place Feet on Step/Stool Able to stand independently and safely and complete 8 steps in  20 seconds   Standing Unsupported, One Foot in [place] to take small step independently and hold 30 seconds   Standing on One Leg Able to lift leg independently and hold equal to or more than 3 seconds   Total Score 51     Knee/Hip Exercises: Stretches   Active Hamstring Stretch Both;2 reps;10 seconds   Piriformis Stretch Both;2 reps;10 seconds   Gastroc Stretch 3 reps;30 seconds     Knee/Hip Exercises: Aerobic   Nustep L3 x 10 minutes  Therapist present to discuss treatment; LE only     Knee/Hip Exercises: Machines for Strengthening   Total Gym Leg Press Seat 8 #105 Bil 2x10; single 45# 2x10     Knee/Hip Exercises: Standing   Walking with Sports Cord #25 forward/ #20 backward  x10 each CGA; #15 side/side     Shoulder  Exercises: Standing   Extension Strengthening;Both;10 reps  #25   Row Strengthening;Both;10 reps  #25; upright and regular row   Other Standing Exercises [place]ler raises flexion, abduction, scaption  Done seated                PT Education - 07/21/16 1609    Education provided Yes   Education Details strength and balance   Person(s) Educated Patient;Spouse   Methods Explanation;Demonstration;Handout   Comprehension Verbalized understanding          PT Short Term Goals - 07/21/16 1704      PT SHORT TERM GOAL #1   Title perform Oceanographer assessment   Status Achieved     PT SHORT TERM GOAL #2   Title independent with initial HEP   Status Achieved     PT SHORT TERM GOAL #3   Title 5 x sit to stand < or = to 33 seconds for reduced risk of falls   Status Achieved     PT SHORT TERM GOAL #4   Title demonstrate heel strike 50% of the time for improved gait   Status Achieved           PT Long Term Goals - 07/21/16 1530      PT LONG TERM GOAL #1   Title independent with advanced HEP   Status Achieved     PT LONG TERM GOAL #2   Title 5 x sit to stand < or = to 27 sec for significant reduction in fall risk   Status Achieved     PT LONG TERM GOAL #3   Title reports experiencing 50% less neck and knee pain due to improved gait and posture   Time 8   Period Weeks   Status Achieved     PT LONG TERM GOAL #4   Title improved TUG to 12 sec or less for reduced fall risk   Time 8   Period Weeks   Status Not Met     PT LONG TERM GOAL #5   Title Improved Berg score to > or = to 49/56 for reduced risk of falls   Baseline 51/56   Time 8   Period Weeks   Status Achieved               Plan - 07/21/16 1542    Clinical Impression Statement Pt progressing well with posture, balance, and strength. Pt wife is concerned that after therapy is over pt will not continue with exercises. Therapist encouraged patient to continue with exercsies at home. Pt has met  all short term goals and most long term goals. Pt  did not meet goal for improved TUG. Pt did improve BERG balance score to 51.  Pt has been given HEP and encouraged to continue independently.       Patient will benefit from skilled therapeutic intervention in order to improve the following deficits and impairments:     Visit Diagnosis: Difficulty in walking, not elsewhere classified  Muscle weakness (generalized)       G-Codes - 16-Aug-2016 1719    Functional Assessment Tool Used (Outpatient Only) TUG, 5xsit to stand, clinical reasoning   Functional Limitation Mobility: Walking and moving around   Mobility: Walking and Moving Around Current Status [phone]) At least 40 percent but less than 60 percent impaired, limited or restricted   Mobility: Walking and Moving Around Goal Status [phone]) At least 40 percent but less than 60 percent impaired, limited or restricted   Mobility: Walking and Moving Around Discharge Status [phone]) At least 40 percent but less than 60 percent impaired, limited or restricted      Problem List Patient Active Problem List   Diagnosis Date Noted  . Osteoarthritis, multiple sites 07/08/2016  . Risk for falls 07/08/2016  . Premature atrial contractions 05/11/2015  . Sepsis ([place]) 04/29/2015  . History of small bowel obstruction 04/29/2015  . History of traumatic brain injury 04/29/2015  . Acute respiratory failure ([place]) 04/29/2015  . CAP (community acquired pneumonia)   . Unspecified cerebral artery occlusion with cerebral infarction 08/25/2012  . Aphasia 08/25/2012  . Macular degeneration 04/25/2012  . CVA (cerebral infarction) 03/20/2012  . Small bowel obstruction 09/12/2011  . Nausea & vomiting 09/12/2011  . Memory loss 04/01/2010  . 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 SKIN LESION 10/24/2009  . NEOPLASM, SKIN, UNCERTAIN BEHAVIOR 29/52/8413  . DYSGEUSIA 10/10/2009  . Hyperlipidemia 07/20/2008  . DEPRESSION 07/20/2008  . Essential hypertension  07/20/2008  . RHINITIS 07/20/2008  . COLONIC POLYPS, HX OF 07/20/2008  . GERD 07/18/2008  . PROSTATE CANCER, HX OF 07/18/2008    Mikle Bosworth, PTA 2016-08-16 5:19 PM   [facility] [address] [place], [place], [address] Phone: [phone]   Fax:  [phone]  Name: Eric George MRN: [id number] Date of Birth: [birth year]  PHYSICAL THERAPY DISCHARGE SUMMARY  Visits from Start of Care: 13  Current functional level related to goals / functional outcomes: See above goals   Remaining deficit See above details   Education / Equipment: HEP  Plan: Patient agrees to discharge.  Patient goals were met. Patient is being discharged due to meeting the stated rehab goals.  ?????        [employer], PT 08-16-2016 5:19 PM

## 2016-07-28 DIAGNOSIS — K449 Diaphragmatic hernia without obstruction or gangrene: Secondary | ICD-10-CM | POA: Diagnosis not present

## 2016-07-28 DIAGNOSIS — Z86010 Personal history of colonic polyps: Secondary | ICD-10-CM | POA: Diagnosis not present

## 2016-07-28 DIAGNOSIS — K5730 Diverticulosis of large intestine without perforation or abscess without bleeding: Secondary | ICD-10-CM | POA: Diagnosis not present

## 2016-07-28 DIAGNOSIS — K219 Gastro-esophageal reflux disease without esophagitis: Secondary | ICD-10-CM | POA: Diagnosis not present

## 2016-07-28 DIAGNOSIS — K5900 Constipation, unspecified: Secondary | ICD-10-CM | POA: Diagnosis not present

## 2016-07-29 ENCOUNTER — Other Ambulatory Visit: Payer: Self-pay | Admitting: *Deleted

## 2016-07-29 MED ORDER — ATORVASTATIN CALCIUM 40 MG PO TABS: ORAL_TABLET | ORAL | 2 refills | Status: DC

## 2016-08-11 DIAGNOSIS — H25812 Combined forms of age-related cataract, left eye: Secondary | ICD-10-CM | POA: Diagnosis not present

## 2016-08-11 DIAGNOSIS — H2512 Age-related nuclear cataract, left eye: Secondary | ICD-10-CM | POA: Diagnosis not present

## 2016-09-02 DIAGNOSIS — D1801 Hemangioma of skin and subcutaneous tissue: Secondary | ICD-10-CM | POA: Diagnosis not present

## 2016-09-02 DIAGNOSIS — L821 Other seborrheic keratosis: Secondary | ICD-10-CM | POA: Diagnosis not present

## 2016-09-02 DIAGNOSIS — L570 Actinic keratosis: Secondary | ICD-10-CM | POA: Diagnosis not present

## 2016-09-02 DIAGNOSIS — L814 Other melanin hyperpigmentation: Secondary | ICD-10-CM | POA: Diagnosis not present

## 2016-09-02 DIAGNOSIS — Z85828 Personal history of other malignant neoplasm of skin: Secondary | ICD-10-CM | POA: Diagnosis not present

## 2016-09-15 DIAGNOSIS — H2511 Age-related nuclear cataract, right eye: Secondary | ICD-10-CM | POA: Diagnosis not present

## 2016-09-15 DIAGNOSIS — H25811 Combined forms of age-related cataract, right eye: Secondary | ICD-10-CM | POA: Diagnosis not present

## 2016-09-27 ENCOUNTER — Other Ambulatory Visit: Payer: Self-pay | Admitting: Family Medicine

## 2016-10-01 ENCOUNTER — Ambulatory Visit (INDEPENDENT_AMBULATORY_CARE_PROVIDER_SITE_OTHER): Payer: Medicare Other | Admitting: Family Medicine

## 2016-10-01 ENCOUNTER — Telehealth: Payer: Self-pay | Admitting: *Deleted

## 2016-10-01 ENCOUNTER — Encounter: Payer: Self-pay | Admitting: Family Medicine

## 2016-10-01 VITALS — BP 120/80 | HR 65 | Temp 97.9°F | Ht 74.0 in | Wt 189.1 lb

## 2016-10-01 DIAGNOSIS — R110 Nausea: Secondary | ICD-10-CM | POA: Diagnosis not present

## 2016-10-01 DIAGNOSIS — R42 Dizziness and giddiness: Secondary | ICD-10-CM

## 2016-10-01 DIAGNOSIS — I959 Hypotension, unspecified: Secondary | ICD-10-CM | POA: Diagnosis not present

## 2016-10-01 LAB — BASIC METABOLIC PANEL WITH GFR
BUN: 22 mg/dL (ref 7–25)
CO2: 24 mmol/L (ref 20–31)
Calcium: 9.6 mg/dL (ref 8.6–10.3)
Chloride: 105 mmol/L (ref 98–110)
Creat: 1.08 mg/dL (ref 0.70–1.11)
GFR, Est African American: 73 mL/min (ref >=60)
GFR, Est Non African American: 63 mL/min (ref >=60)
Glucose, Bld: 91 mg/dL (ref 65–99)
Potassium: 5.1 mmol/L (ref 3.5–5.3)
Sodium: 139 mmol/L (ref 135–146)

## 2016-10-01 NOTE — Progress Notes (Signed)
HPI:  Acute visit for Hypertension/low BP: -wife reports he had a little upset stomach nausea this morning and felt a little dizzy - wife checked BP and it was low so she called EMS -EMS evaluated and vital returned to normal, EKG ok with sinus rhythm, PVS and unchanged from prior on my review strip from EMS today, no ischemic changes - see copy inchart -pt had BM and felt better -no CP, palpitaitons, sob, fevers, vomiting, diarrhea, HA, vision changes, weakness, change in speech or symptoms since -meds include coreg 3.125, irbesartan 150 -hx frequent PVCs - sees cardiology for this and similar episode dizziness last year  ROS: See pertinent positives and negatives per HPI.  Past Medical History:  Diagnosis Date  . Bowel obstruction (Eric George)   . CAP (community acquired pneumonia)   . 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 Colloid cyst of brain (Eric George)   . GERD (gastroesophageal reflux disease)   . Hydrocephalus   . Hyperlipidemia   . Hypertension   . Osteoarthritis, multiple sites 07/08/2016  . 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 Stroke (Eric George)   . TBI (traumatic brain injury) Eric George)     Past Surgical History:  Procedure Laterality Date  . BRAIN SURGERY    . HERNIA REPAIR    . PROSTATECTOMY    . TEE WITHOUT CARDIOVERSION  03/22/2012   Procedure: TRANSESOPHAGEAL ECHOCARDIOGRAM (TEE);  Surgeon: [name], MD;  Location: [facility] ENDOSCOPY;  Service: Cardiovascular;  Laterality: N/A;    Family History  Problem Relation Age of Onset  . Heart disease Sister     Social History   Social History  . Marital status: Married    Spouse name: Eric George   . Number of children: 2  . Years of education: N/A   Occupational History  . retired    Social History Main Topics  . Smoking status: Former Smoker    Packs/day: 0.50    Years: 15.00    Types: Cigarettes    Quit date: 09/30/1966  . Smokeless tobacco: Never Used  . Alcohol use No  . Drug use: No  . Sexual activity:  No   Other Topics Concern  . None   Social History Narrative   Patient lives at home with his wife Eric George   Patient is right handed.    He is retired and has a Gaffer.    Patient has 2 children.    Patient drinks 5 or more cups daily.     Current Outpatient Prescriptions:  .  acetaminophen (TYLENOL) 325 MG tablet, Take 650 mg by mouth every 6 (six) hours as needed., Disp: , Rfl:  .  acetic acid-hydrocortisone (VOSOL-HC) otic solution, Place 5 drops into both ears every 30 (thirty) days. , Disp: , Rfl:  .  aspirin 325 MG tablet, Take 1 tablet (325 mg total) by mouth daily., Disp: 31 tablet, Rfl: 0 .  atorvastatin (LIPITOR) 40 MG tablet, TAKE 1 TABLET DAILY AT 6 P.M., Disp: 90 tablet, Rfl: 2 .  carvedilol (COREG) 3.125 MG tablet, TAKE 1 TABLET TWICE A DAY WITH MEALS, Disp: 180 tablet, Rfl: 3 .  irbesartan (AVAPRO) 150 MG tablet, TAKE 1 TABLET DAILY, Disp: 90 tablet, Rfl: 2 .  ketorolac (ACULAR) 0.5 % ophthalmic solution, 1 DROP IN LEFT EYE FOUR TIMES A DAY START 72 HOURS PRIOR TO SURGERY, Disp: , Rfl: 1 .  Multiple Vitamins-Minerals (MACULAR VITAMIN BENEFIT) TABS, Take 1 capsule by mouth daily., Disp: , Rfl:  .  ofloxacin (OCUFLOX) 0.3 % ophthalmic solution, 1 DROP IN BOTH EYES FOUR TIMES A DAY START 72 HOURS PRIOR TO SURGERY, Disp: , Rfl: 1 .  omeprazole-sodium bicarbonate (ZEGERID) 40-1100 MG per capsule, Take 1 capsule by mouth 2 (two) times daily.  , Disp: , Rfl:  .  prednisoLONE acetate (PRED FORTE) 1 % ophthalmic suspension, INSTILL 1 DROP IN LEFT EYE FOUR TIMES A DAY START AFTER SURGERY, Disp: , Rfl: 1 .  Probiotic Product (ALIGN) 4 MG CAPS, Take 1 capsule by mouth daily., Disp: , Rfl:  .  sertraline (ZOLOFT) 100 MG tablet, TAKE 1 TABLET DAILY, Disp: 90 tablet, Rfl: 1  EXAM:  Vitals:   10/01/16 1326  BP: 120/80  Pulse: 65  Temp: 97.9 F (36.6 C)    Body mass index is 24.28 kg/m.  GENERAL: vitals reviewed and listed above, alert, oriented, appears well hydrated and  in no acute distress  HEENT: atraumatic, conjunttiva clear, no obvious abnormalities on inspection of external nose and ears  NECK: no obvious masses on inspection  LUNGS: clear to auscultation bilaterally, no wheezes, rales or rhonchi, good air movement  CV: HRRR, no peripheral edema  MS: moves all extremities without noticeable abnormality  PSYCH: pleasant and cooperative, no obvious depression or anxiety, CN II-XII grossly intact, finger to nose normal, does well with getting on and off table, uses cane to ambulate  ASSESSMENT AND PLAN:  Discussed the following assessment and plan:  Hypotension, unspecified hypotension type - Plan: BMP with eGFR  Nausea  Dizziness - Plan: BMP with eGFR  -we discussed possible serious and likely etiologies, workup and treatment, treatment risks and return precautions; symptoms resolved quickly and vital ok, EKG ok, query vertigo or vagal response to indigestion vs other  -after this discussion, Eric George and his wife opted to check BMP, observation -follow up advised in 1 month and as needed -of course, we advised Eric George  to return or notify a doctor immediately if symptoms worsen or persist or new concerns arise. They agreed to seek emergency care if has another such event.   Patient Instructions  BEFORE YOU LEAVE: -copy EKG to scan -lab -follow up: with PCP in 1 month  Seek care immediately if further episodes, worsening, new concerns       Eric George R., DO

## 2016-10-01 NOTE — Telephone Encounter (Signed)
Patient's spouse called in with EMS in home and on phone line, EMS/spouse reporting patient had episode of low blood pressure 70/unknown via home monitor, patient felt weak, diaphoretic, and was pale, EMS was called, however patient rapidly returned to normal. Upon EMS arrival orthostatics checked and were negative and BP was 138/82. Patient reports feeling fine and denies any further symptoms, per EMS EKG done in home and no abnormalities, patient would like to be seen by PCP vs. ED. Scheduled patient for appt with MD at 1330; educated on s/s to report to EMS prior to MD appointment, educated on safe/slow  transfers and ambulation. Patient/spouse voiced understanding to instructions and EMS ensured patient stable and to review instructions again with patient and spouse prior to leaving patient's home.

## 2016-10-01 NOTE — Patient Instructions (Addendum)
BEFORE YOU LEAVE: -copy EKG to scan -lab -follow up: with PCP in 1 month  Seek care immediately if further episodes, worsening, new concerns

## 2016-10-02 DIAGNOSIS — R031 Nonspecific low blood-pressure reading: Secondary | ICD-10-CM | POA: Diagnosis not present

## 2016-10-06 DIAGNOSIS — Z0289 Encounter for other administrative examinations: Secondary | ICD-10-CM

## 2016-10-07 ENCOUNTER — Telehealth: Payer: Self-pay | Admitting: Family Medicine

## 2016-10-07 NOTE — Telephone Encounter (Signed)
Form dropped off on 10/06/16.  Dr. Elease Hashimoto not in the office until the following week.  Will place on his desk for signature.

## 2016-10-19 NOTE — Telephone Encounter (Signed)
Spoke to [place] and informed her to pick up at the front desk.  She stated she will pick up within a few days.  A copy sent to scan and a copy retained for my records.

## 2016-10-22 ENCOUNTER — Telehealth: Payer: Self-pay | Admitting: Family Medicine

## 2016-10-22 NOTE — Telephone Encounter (Signed)
Patient's wife picked up Hand[employer]ap Placard form that was dropped off for Eric George on 10/06/16

## 2016-10-30 ENCOUNTER — Ambulatory Visit (INDEPENDENT_AMBULATORY_CARE_PROVIDER_SITE_OTHER): Payer: Medicare Other | Admitting: Family Medicine

## 2016-10-30 ENCOUNTER — Encounter: Payer: Self-pay | Admitting: Family Medicine

## 2016-10-30 VITALS — BP 132/80 | HR 104 | Temp 97.7°F | Ht 74.0 in | Wt 191.2 lb

## 2016-10-30 DIAGNOSIS — I10 Essential (primary) hypertension: Secondary | ICD-10-CM | POA: Diagnosis not present

## 2016-10-30 NOTE — Patient Instructions (Signed)
Follow up for any recurrent dizziness.

## 2016-10-30 NOTE — Progress Notes (Signed)
Subjective:     Patient ID: Eric George, male   DOB: [birth year], 83 y.o.   MRN: [id number]  HPI Patient seen for follow-up regarding hypotensive episode which occurred about a month ago. He was having some epigastric pain and dyspepsia and question of vasovagal. He had basic metabolic panel which was unremarkable. EKG per EMS showed some PVCs but no other acute changes. He's had no episodes whatsoever since then. He remains on low-dose Coreg and also low-dose Avapro. No chest pains. No dyspnea. No syncope.  Past Medical History:  Diagnosis Date  . Bowel obstruction ([place])   . CAP (community acquired pneumonia)   . 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 Colloid cyst of brain ([place])   . GERD (gastroesophageal reflux disease)   . Hydrocephalus   . Hyperlipidemia   . Hypertension   . Osteoarthritis, multiple sites 07/08/2016  . 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 Stroke ([place])   . TBI (traumatic brain injury) [facility]    Past Surgical History:  Procedure Laterality Date  . BRAIN SURGERY    . HERNIA REPAIR    . PROSTATECTOMY    . TEE WITHOUT CARDIOVERSION  03/22/2012   Procedure: TRANSESOPHAGEAL ECHOCARDIOGRAM (TEE);  Surgeon: [name], MD;  Location: [facility] ENDOSCOPY;  Service: Cardiovascular;  Laterality: N/A;    reports that he quit smoking about 50 years ago. His smoking use included Cigarettes. He has a 7.50 pack-year smoking history. He has never used smokeless tobacco. He reports that he does not drink alcohol or use drugs. family history includes Heart disease in his sister. Allergies  Allergen Reactions  . Penicillins Rash     Review of Systems  Constitutional: Negative for fatigue.  Eyes: Negative for visual disturbance.  Respiratory: Negative for cough, chest tightness and shortness of breath.   Cardiovascular: Negative for chest pain, palpitations and leg swelling.  Neurological: Negative for dizziness, syncope, weakness, light-headedness and headaches.        Objective:   Physical Exam  Constitutional: He is oriented to person, place, and time. He appears well-developed and well-nourished.  HENT:  Right Ear: External ear normal.  Left Ear: External ear normal.  Mouth/Throat: Oropharynx is clear and moist.  Eyes: Pupils are equal, round, and reactive to light.  Neck: Neck supple. No thyromegaly present.  Cardiovascular: Normal rate and regular rhythm.   Pulmonary/Chest: Effort normal and breath sounds normal. No respiratory distress. He has no wheezes. He has no rales.  Musculoskeletal: He exhibits no edema.  Neurological: He is alert and oriented to person, place, and time.       Assessment:     #1 hypertension stable and at goal  #2 recent hypotensive episode possibly vasovagal related    Plan:     -Continue current medications -Stay well-hydrated -Follow-up immediately for any recurrent dizziness or other concerns otherwise routine follow-up in 6 months  Eulas Post MD [facility] at [facility]

## 2016-11-01 ENCOUNTER — Other Ambulatory Visit: Payer: Self-pay | Admitting: Family Medicine

## 2016-12-08 ENCOUNTER — Ambulatory Visit (INDEPENDENT_AMBULATORY_CARE_PROVIDER_SITE_OTHER): Payer: Medicare Other | Admitting: Family Medicine

## 2016-12-08 ENCOUNTER — Encounter: Payer: Self-pay | Admitting: Family Medicine

## 2016-12-08 ENCOUNTER — Ambulatory Visit: Payer: Medicare Other

## 2016-12-08 VITALS — BP 120/80 | HR 67 | Temp 97.6°F | Wt 190.5 lb

## 2016-12-08 DIAGNOSIS — M549 Dorsalgia, unspecified: Secondary | ICD-10-CM

## 2016-12-08 DIAGNOSIS — R202 Paresthesia of skin: Secondary | ICD-10-CM

## 2016-12-08 DIAGNOSIS — R531 Weakness: Secondary | ICD-10-CM | POA: Diagnosis not present

## 2016-12-08 DIAGNOSIS — Z1321 Encounter for screening for nutritional disorder: Secondary | ICD-10-CM | POA: Diagnosis not present

## 2016-12-08 DIAGNOSIS — M150 Primary generalized (osteo)arthritis: Secondary | ICD-10-CM | POA: Diagnosis not present

## 2016-12-08 DIAGNOSIS — M8949 Other hypertrophic osteoarthropathy, multiple sites: Secondary | ICD-10-CM

## 2016-12-08 DIAGNOSIS — Z9181 History of falling: Secondary | ICD-10-CM

## 2016-12-08 DIAGNOSIS — M159 Polyosteoarthritis, unspecified: Secondary | ICD-10-CM

## 2016-12-08 LAB — SEDIMENTATION RATE: Sed Rate: 5 mm/hr (ref 0–20)

## 2016-12-08 LAB — VITAMIN B12: Vitamin B-12: 628 pg/mL (ref 211–911)

## 2016-12-08 LAB — TSH: TSH: 3.35 u[IU]/mL (ref 0.35–4.50)

## 2016-12-08 NOTE — Progress Notes (Signed)
Subjective:     Patient ID: Eric George, male   DOB: 11-12-1930, 83 y.o.   MRN: [id number]  HPI Patient has multiple chronic problems including osteoarthritis, mild cognitive impairment, increased risk for falls, hyperlipidemia, history of traumatic brain injury, history of recurrent small bowel obstruction, GERD, hypertension, macular degeneration, history of CVA, and history of prostate cancer. He is accompanied by his wife today.  He has several issues. First, she states that he seems more "off-balance "over the past several weeks if not months. He had extensive physical therapy for 3 months which ended around March and she felt like he was doing better at that time. No recent head injury. Does not use any assistive devices for ambulation. She thinks he had some generalized weakness especially in his lower extremities.He's had prior history of stroke feet years ago but no acute focal weakness, slurred speech, or any other focal neurologic concerns  Patient has some chronic upper back and neck pains and also knee pains currently left knee greater than right. Is not walking nearly as much as he had been a few months ago. His upper back pain is fairly generalized but mostly lower cervical spine but somewhat diffuse upper back. Denies any pain throughout the trunk region. No hip pains.  Occasional transient mild paresthesias of lower extremities but not consistently  Past Medical History:  Diagnosis Date  . Bowel obstruction ([place])   . CAP (community acquired pneumonia)   . 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 Colloid cyst of brain ([place])   . GERD (gastroesophageal reflux disease)   . Hydrocephalus   . Hyperlipidemia   . Hypertension   . Osteoarthritis, multiple sites 07/08/2016  . 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 Stroke ([place])   . TBI (traumatic brain injury) [facility])    Past Surgical History:  Procedure Laterality Date  . BRAIN SURGERY    . HERNIA REPAIR    . PROSTATECTOMY     . TEE WITHOUT CARDIOVERSION  03/22/2012   Procedure: TRANSESOPHAGEAL ECHOCARDIOGRAM (TEE);  Surgeon: [name], MD;  Location: [facility] ENDOSCOPY;  Service: Cardiovascular;  Laterality: N/A;    reports that he quit smoking about 50 years ago. His smoking use included Cigarettes. He has a 7.50 pack-year smoking history. He has never used smokeless tobacco. He reports that he does not drink alcohol or use drugs. family history includes Heart disease in his sister. Allergies  Allergen Reactions  . Penicillins Rash     Review of Systems  Constitutional: Positive for fatigue. Negative for chills and fever.  HENT: Negative for trouble swallowing.   Eyes: Negative for visual disturbance.  Respiratory: Negative for shortness of breath.   Cardiovascular: Negative for chest pain.  Gastrointestinal: Negative for abdominal pain, diarrhea, nausea and vomiting.  Genitourinary: Negative for dysuria.  Musculoskeletal: Positive for neck pain.  Skin: Negative for rash.  Neurological: Positive for weakness.  Psychiatric/Behavioral: Negative for agitation.       Objective:   Physical Exam  Constitutional: He appears well-developed and well-nourished.  Neck: Neck supple.  Cardiovascular: Normal rate and regular rhythm.   Pulmonary/Chest: Effort normal and breath sounds normal. No respiratory distress. He has no wheezes. He has no rales.  Musculoskeletal: He exhibits no edema.  Patient has fairly limited range of motion with lateral bending and rotation of the neck in either direction. He has good range of motion with flexion and extension. No spinal tenderness.   Left knee no effusion.  No warmth. No localized bony tenderness. Mild crepitus.  Neurological:  Full-strength lower extremities. Symmetric reflexes. Normal sensory function to touch.       Assessment:     #1 chronic upper back and neck pains suspect related osteoarthritis. Doubt PMR  #2 left knee pain. Suspect  osteoarthritis  #3 increased risk for falls. He has some generalized weakness. No ataxia.    Plan:     -Check further labs with TSH, sedimentation rate, B12 level. Would like to get 25 hydroxy vitamin D level but not covered by Medicare codes for increased risk of falls -Set up repeat physical therapy which has benefited from before in the past -Consider starting vitamin D 2000 international units once daily -Office follow-up in 2 months to reassess -Tylenol 325 mg 2 tablets every 6-8 hours as needed for pain -Avoid non-steroidals -Avoid tramadol or opioids given his already poor balance issues  Eulas Post MD [facility] at [facility]

## 2016-12-08 NOTE — Patient Instructions (Addendum)
May increase Tylenol to two every 6 hours as needed We will set up repeat physical therapy. We will call with lab results

## 2016-12-24 ENCOUNTER — Ambulatory Visit: Payer: Medicare Other | Attending: Family Medicine | Admitting: Physical Therapy

## 2016-12-24 DIAGNOSIS — M6281 Muscle weakness (generalized): Secondary | ICD-10-CM | POA: Insufficient documentation

## 2016-12-24 DIAGNOSIS — R262 Difficulty in walking, not elsewhere classified: Secondary | ICD-10-CM | POA: Insufficient documentation

## 2016-12-24 NOTE — Therapy (Signed)
[facility] [address], [place], [place], [address] Phone: [phone]   Fax:  [phone]  Physical Therapy Evaluation  Patient Details  Name: Eric George MRN: [id number] Date of Birth: 04-Jul-1930 Referring Provider: Eulas Post  Encounter Date: 12/24/2016      PT End of Session - 12/24/16 1437    Visit Number 1   Number of Visits 10   Date for PT Re-Evaluation 02/18/17   PT Start Time 6629   PT Stop Time 1528   PT Time Calculation (min) 43 min   Activity Tolerance Patient tolerated treatment well      Past Medical History:  Diagnosis Date  . Bowel obstruction ([place])   . CAP (community acquired pneumonia)   . 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 GERD (gastroesophageal reflux disease)   . Hydrocephalus   . Hyperlipidemia   . Hypertension   . Osteoarthritis, multiple sites 07/08/2016  . 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 Stroke ([place])   . TBI (traumatic brain injury) [facility])     Past Surgical History:  Procedure Laterality Date  . BRAIN SURGERY    . HERNIA REPAIR    . PROSTATECTOMY    . TEE WITHOUT CARDIOVERSION  03/22/2012   Procedure: TRANSESOPHAGEAL ECHOCARDIOGRAM (TEE);  Surgeon: [name], MD;  Location: [facility] ENDOSCOPY;  Service: Cardiovascular;  Laterality: N/A;    There were no vitals filed for this visit.       Subjective Assessment - 12/24/16 1448    Subjective Pt wife states he has been more unsteady since previous therapy.  Still complains about his neck.  Rotation hurts a lot.  Wife reports had some close calls with falling but no falls.  Wife states it is difficult to go anywhere that he'll have to walk far or where there are steps.   Patient is accompained by: Family member  wife   Pertinent History chronic neck pain   Limitations Other (comment);Walking;Standing  looking   Patient Stated Goals going up and down steps,  increased endurance, more steadiness   Currently in Pain? Yes   Pain Score 5    Pain Location Neck   Pain Orientation Left   Pain Descriptors / Indicators Dull;Aching   Pain Type Chronic pain   Pain Radiating Towards from ear into base of neck both side Lt>Rt   Pain Onset More than a month ago   Pain Frequency Intermittent   Aggravating Factors  turning head   Pain Relieving Factors not moving   Multiple Pain Sites No            [facility] PT Assessment - 12/24/16 0001      Assessment   Medical Diagnosis R53.1 (ICD-10-CM) - Weakness;Z91.81 (ICD-10-CM) - At high risk for falls   Referring Provider Eulas Post   Onset Date/Surgical Date --  about a year when balance and neck pain worsened   Hand Dominance Right   Prior Therapy yes     Precautions   Precautions Fall     Restrictions   Weight Bearing Restrictions No     Balance Screen   Has the patient fallen in the past 6 months No     [place] residence   Living Arrangements Spouse/significant other   Home Layout --  [place] - unable to go  Prior Function   Level of Independence Independent   Leisure going to the [employer]   Overall Cognitive Status Within Functional Limits for tasks assessed     Posture/Postural Control   Posture/Postural Control Postural limitations   Postural Limitations Rounded Shoulders;Forward head;Increased thoracic kyphosis  locks out knees bilaterally     ROM / Strength   AROM / PROM / Strength Strength     Strength   Overall Strength Deficits   Strength Assessment Site Ankle;Hip;Knee   Right/Left Hip Right;Left   Right Hip ABduction 4-/5   Right Hip ADduction 4-/5   Left Hip Flexion 4-/5   Left Hip ABduction 4-/5   Left Hip ADduction 4-/5   Right/Left Knee Left   Left Knee Flexion 4/5   Right/Left Ankle Left   Left Ankle Dorsiflexion 4-/5     Transfers   Five time sit to stand comments  26  one UE       Ambulation/Gait   Ambulation/Gait Yes   Ambulation/Gait Assistance 7: Independent   Ambulation Distance (Feet) 100 Feet   Assistive device --  no but usually uses SPC   Gait Pattern Decreased stride length;Lateral trunk lean to right;Lateral trunk lean to left;Right genu recurvatum;Left genu recurvatum     Standardized Balance Assessment   Standardized Balance Assessment Berg Balance Test     Berg Balance Test   Sit to Stand Able to stand  independently using hands   Standing Unsupported Able to stand safely 2 minutes   Sitting with Back Unsupported but Feet Supported on Floor or Stool Able to sit safely and securely 2 minutes   Stand to Sit Sits safely with minimal use of hands   Transfers Able to transfer safely, minor use of hands   Standing Unsupported with Eyes Closed Able to stand 10 seconds safely   Standing Ubsupported with Feet Together Able to place feet together independently and stand for 1 minute with supervision   From Standing, Reach Forward with Outstretched Arm Can reach forward >12 cm safely (5")   From Standing Position, Pick up Object from Floor Unable to pick up and needs supervision   From Standing Position, Turn to Look Behind Over each Shoulder Turn sideways only but maintains balance   Turn 360 Degrees Able to turn 360 degrees safely but slowly   Standing Unsupported, Alternately Place Feet on Step/Stool Able to stand independently and safely and complete 8 steps in 20 seconds   Standing Unsupported, One Foot in Front Able to plae foot ahead of the other independently and hold 30 seconds   Standing on One Leg Able to lift leg independently and hold equal to or more than 3 seconds   Total Score 43     Timed Up and Go Test   Normal TUG (seconds) 18            Objective measurements completed on examination: See above findings.                  PT Education - 12/24/16 1537    Education provided Yes   Education Details standing hip  abduction and extension, single leg standing at counter (no handout, pt wife states they had these already)   Person(s) Educated Patient;Spouse   Methods Explanation;Demonstration   Comprehension Verbalized understanding;Returned demonstration          PT Short Term Goals - 12/24/16 1529      PT SHORT TERM GOAL #1   Title  increase Berg Balance assessment to 46/56   Time 4   Period Weeks   Status New     PT SHORT TERM GOAL #2   Title independent with initial HEP   Time 4   Period Weeks   Status New     PT SHORT TERM GOAL #3   Title 5 x sit to stand < or = to 22 seconds for reduced risk of falls   Time 4   Period Weeks   Status New     PT SHORT TERM GOAL #4   Title TUG < or = to 14 sec   Time 4   Period Weeks   Status New           PT Long Term Goals - 12/24/16 1531      PT LONG TERM GOAL #1   Title independent with advanced HEP   Time 8   Period Weeks   Status New     PT LONG TERM GOAL #2   Title 5 x sit to stand < or = to 20 sec without using UE for significant reduction in fall risk   Time 8   Period Weeks   Status New     PT LONG TERM GOAL #3   Title reports experiencing 50% less neck and knee pain due to improved postural stability and LE strength   Time 8   Period Weeks   Status New     PT LONG TERM GOAL #4   Title improved TUG to 13 sec or less without cane for reduced fall risk   Time 8   Period Weeks   Status New     PT LONG TERM GOAL #5   Title Improved Berg score to > or = to 50/56 for reduced risk of falls   Time 8   Period Weeks   Status New                Plan - 12/24/16 1603    Clinical Impression Statement Pt presents to clinic with weakness and increased unsteadiness of gait.  Pt is familiar to this clinic and was seen earlier this year for neck pain and gait.  He was referred due to decreased stability and his neck pain continues but was not placed on his current script from the doctor.  Pt demonstrates unsteadiness in  gait, small step length, increased thoracic kyphosis and forwrad head.  He has bilateral LE weakness and locks out knees due to quad weakness when standing and during gait.  Pt is using single point cane when walking but did everything during evaluation without cane.  PT scores at fall risk categories in all balance risk assessments including Berg, TUG, and 5 times sit to stand.  Pt will benefit from skilled PT to address these impairments and reduce risk of falls.   History and Personal Factors relevant to plan of care: chronic neck pain   Clinical Presentation Evolving   Clinical Presentation due to: balance has gotten worse   Clinical Decision Making Low   Rehab Potential Good   Clinical Impairments Affecting Rehab Potential chronic neck pain   PT Frequency 2x / week   PT Duration 8 weeks   PT Treatment/Interventions ADLs/Self Care Home Management;Dry needling;Taping;Passive range of motion;Manual techniques;Patient/family education;Neuromuscular re-education;Balance training;Therapeutic exercise;Therapeutic activities;Stair training;Gait training;Ultrasound;Moist Heat;Electrical Stimulation;Cryotherapy;Traction;Biofeedback   PT Next Visit Plan posture, sit to stand, gait, LE strength, balance   PT Home Exercise Plan progress as needed   Recommended Other  Services n/a   Consulted and Agree with Plan of Care Patient      Patient will benefit from skilled therapeutic intervention in order to improve the following deficits and impairments:  Abnormal gait, Decreased balance, Decreased coordination, Decreased endurance, Difficulty walking, Decreased strength, Postural dysfunction, Pain  Visit Diagnosis: Difficulty in walking, not elsewhere classified  Muscle weakness (generalized)      G-Codes - 01/23/2017 1529    Functional Assessment Tool Used (Outpatient Only) TUG, Berg, clinical assessment   Functional Limitation Mobility: Walking and moving around   Mobility: Walking and Moving Around  Current Status [phone]) At least 20 percent but less than 40 percent impaired, limited or restricted   Mobility: Walking and Moving Around Goal Status [phone]) At least 1 percent but less than 20 percent impaired, limited or restricted       Problem List Patient Active Problem List   Diagnosis Date Noted  . Osteoarthritis, multiple sites 07/08/2016  . Risk for falls 07/08/2016  . Premature atrial contractions 05/11/2015  . Sepsis ([place]) 04/29/2015  . History of small bowel obstruction 04/29/2015  . History of traumatic brain injury 04/29/2015  . Acute respiratory failure ([place]) 04/29/2015  . CAP (community acquired pneumonia)   . Unspecified cerebral artery occlusion with cerebral infarction 08/25/2012  . Aphasia 08/25/2012  . Macular degeneration 04/25/2012  . CVA (cerebral infarction) 03/20/2012  . Small bowel obstruction ([place]) 09/12/2011  . Nausea & vomiting 09/12/2011  . Memory loss 04/01/2010  . 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 SKIN LESION 10/24/2009  . NEOPLASM, SKIN, UNCERTAIN BEHAVIOR 55/37/4827  . DYSGEUSIA 10/10/2009  . Hyperlipidemia 07/20/2008  . DEPRESSION 07/20/2008  . Essential hypertension 07/20/2008  . RHINITIS 07/20/2008  . COLONIC POLYPS, HX OF 07/20/2008  . GERD 07/18/2008  . PROSTATE CANCER, HX OF 07/18/2008    Zannie Cove, PT Jan 23, 2017, 5:35 PM  [facility] [address], [place], [place], [address] Phone: [phone]   Fax:  [phone]  Name: Eric George MRN: [id number] Date of Birth: 1930-09-27

## 2016-12-25 DIAGNOSIS — H6242 Otitis externa in other diseases classified elsewhere, left ear: Secondary | ICD-10-CM | POA: Diagnosis not present

## 2016-12-25 DIAGNOSIS — Z974 Presence of external hearing-aid: Secondary | ICD-10-CM | POA: Diagnosis not present

## 2016-12-25 DIAGNOSIS — H903 Sensorineural hearing loss, bilateral: Secondary | ICD-10-CM | POA: Diagnosis not present

## 2016-12-25 DIAGNOSIS — H6121 Impacted cerumen, right ear: Secondary | ICD-10-CM | POA: Diagnosis not present

## 2016-12-25 DIAGNOSIS — Z8673 Personal history of transient ischemic attack (TIA), and cerebral infarction without residual deficits: Secondary | ICD-10-CM | POA: Diagnosis not present

## 2016-12-25 DIAGNOSIS — B369 Superficial mycosis, unspecified: Secondary | ICD-10-CM | POA: Diagnosis not present

## 2016-12-25 DIAGNOSIS — Z87891 Personal history of nicotine dependence: Secondary | ICD-10-CM | POA: Diagnosis not present

## 2016-12-29 ENCOUNTER — Encounter: Payer: Self-pay | Admitting: Physical Therapy

## 2016-12-29 ENCOUNTER — Ambulatory Visit: Payer: Medicare Other | Admitting: Physical Therapy

## 2016-12-29 DIAGNOSIS — M6281 Muscle weakness (generalized): Secondary | ICD-10-CM

## 2016-12-29 DIAGNOSIS — R262 Difficulty in walking, not elsewhere classified: Secondary | ICD-10-CM

## 2016-12-29 NOTE — Patient Instructions (Signed)
   SINGLE LEG STANCE - SLS  Stand on one leg and maintain your balance as long as you can - standing in front of something stable like counter - repeat 3x on each side aiming for 10 sec hold    SUPINE HIP ABDUCTION - ELASTIC BAND CLAMS  Lie down on your back with your knees bent. Place an elastic band around your knees and then draw your knees apart. Green band 20x - try to exhale as you pull knees apart   LONG ARC QUAD - LAQ - HIGH SEAT  While seated with your knee in a bent position, slowly straighten your knee as you raise your foot upwards as shown. Hold 5 sec repeat 15x each side

## 2016-12-29 NOTE — Therapy (Signed)
[facility] [address], [place], [place], [address] Phone: [phone]   Fax:  [phone]  Physical Therapy Treatment  Patient Details  Name: Eric George MRN: [id number] Date of Birth: May 04, 1931 Referring Provider: Eulas Post  Encounter Date: 12/29/2016      PT End of Session - 12/29/16 1402    Visit Number 2   Number of Visits 10   Date for PT Re-Evaluation 02/18/17   PT Start Time 1401   PT Stop Time 1443   PT Time Calculation (min) 42 min   Activity Tolerance Patient tolerated treatment well      Past Medical History:  Diagnosis Date  . Bowel obstruction ([place])   . CAP (community acquired pneumonia)   . 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 GERD (gastroesophageal reflux disease)   . Hydrocephalus   . Hyperlipidemia   . Hypertension   . Osteoarthritis, multiple sites 07/08/2016  . 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 Stroke ([place])   . TBI (traumatic brain injury) [facility])     Past Surgical History:  Procedure Laterality Date  . BRAIN SURGERY    . HERNIA REPAIR    . PROSTATECTOMY    . TEE WITHOUT CARDIOVERSION  03/22/2012   Procedure: TRANSESOPHAGEAL ECHOCARDIOGRAM (TEE);  Surgeon: [name], MD;  Location: [facility] ENDOSCOPY;  Service: Cardiovascular;  Laterality: N/A;    There were no vitals filed for this visit.      Subjective Assessment - 12/29/16 1402    Subjective Pt denies pain right now.  Nothing new to report today.  Reports he "didn't do much of that" when referring to HEP.   Patient is accompained by: Family member  wife   Pertinent History chronic neck pain   Patient Stated Goals going up and down steps, increased endurance, more steadiness   Currently in Pain? No/denies                         [facility] Adult PT Treatment/Exercise - 12/29/16 0001      Knee/Hip Exercises: Aerobic   Nustep L1 x 8 min     Knee/Hip Exercises: Standing   Forward Step Up Right;Left;10 reps;Hand Hold: 1;Hand Hold: 0  had pain that caused knee buckle on 10th on Rt LE   SLS 3 x 30 sec each side     Knee/Hip Exercises: Seated   Long Arc Quad Strengthening;Right;Left  8 reps 5 sec hold - 3lb     Knee/Hip Exercises: Supine   Short Arc Quad Sets Strengthening;Right;Left;15 reps   Short Arc Quad Sets Limitations 3 lb   Bridges with Clamshell Strengthening;Right;Left;20 reps  clam red band     Shoulder Exercises: Supine   Horizontal ABduction Strengthening;Both;10 reps;Theraband   Theraband Level (Shoulder Horizontal ABduction) Level 2 (Red)   External Rotation Strengthening;10 reps;Both;Theraband   Theraband Level (Shoulder External Rotation) Level 2 (Red)   Other Supine Exercises cervical retraction and flexion for immproved posture - 10x each way 5 sec hold     Shoulder Exercises: ROM/Strengthening   UBE (Upper Arm Bike) L2 3x3 (fwd/back)                PT Education - 12/29/16 1441    Education provided Yes   Education Details LAQ, single leg stand, supine clams   Person(s) Educated Patient;Spouse   Methods Explanation;Demonstration;Verbal cues;Handout;Tactile cues  Comprehension Verbalized understanding;Returned demonstration          PT Short Term Goals - 12/24/16 1529      PT SHORT TERM GOAL #1   Title increase Berg Balance assessment to 46/56   Time 4   Period Weeks   Status New     PT SHORT TERM GOAL #2   Title independent with initial HEP   Time 4   Period Weeks   Status New     PT SHORT TERM GOAL #3   Title 5 x sit to stand < or = to 22 seconds for reduced risk of falls   Time 4   Period Weeks   Status New     PT SHORT TERM GOAL #4   Title TUG < or = to 14 sec   Time 4   Period Weeks   Status New           PT Long Term Goals - 12/24/16 1531      PT LONG TERM GOAL #1   Title independent with advanced HEP   Time 8   Period Weeks   Status New     PT LONG  TERM GOAL #2   Title 5 x sit to stand < or = to 20 sec without using UE for significant reduction in fall risk   Time 8   Period Weeks   Status New     PT LONG TERM GOAL #3   Title reports experiencing 50% less neck and knee pain due to improved postural stability and LE strength   Time 8   Period Weeks   Status New     PT LONG TERM GOAL #4   Title improved TUG to 13 sec or less without cane for reduced fall risk   Time 8   Period Weeks   Status New     PT LONG TERM GOAL #5   Title Improved Berg score to > or = to 50/56 for reduced risk of falls   Time 8   Period Weeks   Status New               Plan - 12/29/16 1402    Clinical Impression Statement No goals met yet as this is first treatment since eval. Pt had knee buckle on right on 10th step up.  Reports it felt good at the end of the session.  Pt needs a lot of cues to drop shoulders and exhale during exercises.  Pt will benefit from skilled PT to work in LE strength and stability to improve functional activities like walking with reduced risk of falls.   Rehab Potential Good   Clinical Impairments Affecting Rehab Potential chronic neck pain   PT Treatment/Interventions ADLs/Self Care Home Management;Dry needling;Taping;Passive range of motion;Manual techniques;Patient/family education;Neuromuscular re-education;Balance training;Therapeutic exercise;Therapeutic activities;Stair training;Gait training;Ultrasound;Moist Heat;Electrical Stimulation;Cryotherapy;Traction;Biofeedback   PT Next Visit Plan posture, sit to stand, gait, LE strength, balance   PT Home Exercise Plan progress as needed   Consulted and Agree with Plan of Care Patient      Patient will benefit from skilled therapeutic intervention in order to improve the following deficits and impairments:  Abnormal gait, Decreased balance, Decreased coordination, Decreased endurance, Difficulty walking, Decreased strength, Postural dysfunction, Pain  Visit  Diagnosis: Difficulty in walking, not elsewhere classified  Muscle weakness (generalized)     Problem List Patient Active Problem List   Diagnosis Date Noted  . Osteoarthritis, multiple sites 07/08/2016  . Risk for falls 07/08/2016  .  Premature atrial contractions 05/11/2015  . Sepsis ([place]) 04/29/2015  . History of small bowel obstruction 04/29/2015  . History of traumatic brain injury 04/29/2015  . Acute respiratory failure ([place]) 04/29/2015  . CAP (community acquired pneumonia)   . Unspecified cerebral artery occlusion with cerebral infarction 08/25/2012  . Aphasia 08/25/2012  . Macular degeneration 04/25/2012  . CVA (cerebral infarction) 03/20/2012  . Small bowel obstruction ([place]) 09/12/2011  . Nausea & vomiting 09/12/2011  . Memory loss 04/01/2010  . 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 SKIN LESION 10/24/2009  . NEOPLASM, SKIN, UNCERTAIN BEHAVIOR 76/19/5093  . DYSGEUSIA 10/10/2009  . Hyperlipidemia 07/20/2008  . DEPRESSION 07/20/2008  . Essential hypertension 07/20/2008  . RHINITIS 07/20/2008  . COLONIC POLYPS, HX OF 07/20/2008  . GERD 07/18/2008  . PROSTATE CANCER, HX OF 07/18/2008    Wayne Both 12/29/2016, 2:47 PM  [facility] [address], [place], [place], [address] Phone: [phone]   Fax:  [phone]  Name: MUSTAF ANTONACCI MRN: [id number] Date of Birth: November 23, 1930

## 2016-12-31 ENCOUNTER — Encounter: Payer: Self-pay | Admitting: Physical Therapy

## 2016-12-31 ENCOUNTER — Ambulatory Visit: Payer: Medicare Other | Admitting: Physical Therapy

## 2016-12-31 DIAGNOSIS — H04123 Dry eye syndrome of bilateral lacrimal glands: Secondary | ICD-10-CM | POA: Diagnosis not present

## 2016-12-31 DIAGNOSIS — H5703 Miosis: Secondary | ICD-10-CM | POA: Diagnosis not present

## 2016-12-31 DIAGNOSIS — M6281 Muscle weakness (generalized): Secondary | ICD-10-CM

## 2016-12-31 DIAGNOSIS — R262 Difficulty in walking, not elsewhere classified: Secondary | ICD-10-CM

## 2016-12-31 DIAGNOSIS — Z961 Presence of intraocular lens: Secondary | ICD-10-CM | POA: Diagnosis not present

## 2016-12-31 DIAGNOSIS — H40013 Open angle with borderline findings, low risk, bilateral: Secondary | ICD-10-CM | POA: Diagnosis not present

## 2016-12-31 NOTE — Therapy (Signed)
[facility] [name], [place], [place], [address] Phone: [phone]   Fax:  [phone]  Physical Therapy Treatment  Patient Details  Name: Eric George MRN: [id number] Date of Birth: Sep 02, 1930 Referring Provider: Eulas Post  Encounter Date: 12/31/2016      PT End of Session - 12/31/16 1359    Visit Number 3   Number of Visits 10   Date for PT Re-Evaluation 02/18/17   PT Start Time 8850   PT Stop Time 1439   PT Time Calculation (min) 42 min   Activity Tolerance Patient tolerated treatment well      Past Medical History:  Diagnosis Date  . Bowel obstruction ([place])   . CAP (community acquired pneumonia)   . 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 GERD (gastroesophageal reflux disease)   . Hydrocephalus   . Hyperlipidemia   . Hypertension   . Osteoarthritis, multiple sites 07/08/2016  . 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 Stroke ([place])   . TBI (traumatic brain injury) [facility])     Past Surgical History:  Procedure Laterality Date  . BRAIN SURGERY    . HERNIA REPAIR    . PROSTATECTOMY    . TEE WITHOUT CARDIOVERSION  03/22/2012   Procedure: TRANSESOPHAGEAL ECHOCARDIOGRAM (TEE);  Surgeon: [name], MD;  Location: [facility] ENDOSCOPY;  Service: Cardiovascular;  Laterality: N/A;    There were no vitals filed for this visit.      Subjective Assessment - 12/31/16 1400    Subjective Patient states the only place he gets stiff and holds tension is back of his neck. Pt wife reports he had a lot of neck pain during an eye exam this morning. Pt denies pain currently.   Patient is accompained by: Family member  wife   Pertinent History chronic neck pain   Limitations Other (comment);Walking;Standing  looking   Patient Stated Goals going up and down steps, increased endurance, more steadiness   Currently in Pain? No/denies                          [facility] Adult PT Treatment/Exercise - 12/31/16 0001      Neuro Re-ed    Neuro Re-ed Details  decreased shoulder elevation and single leg exercises with minimal UE support     Knee/Hip Exercises: Aerobic   Nustep L2 x 8 min     Knee/Hip Exercises: Standing   Hip Flexion Stengthening;Both;Knee bent;20 reps  one UE support   Hip Abduction Stengthening;2 sets;10 reps  one UE support, cues to relax shoulders   Forward Step Up Right;Left;Hand Hold: 1;Hand Hold: 0;5 reps  some pain in Lt knee   SLS 3 x 30 sec each side     Knee/Hip Exercises: Seated   Long Arc Quad Strengthening;Right;Left;Weights;2 sets;10 reps   Long Arc Quad Weight 3 lbs.   Hamstring Curl Strengthening;Right;Left;3 sets;10 reps   Hamstring Limitations green band   Sit to Sand 2 sets;10 reps;with UE support  min UE support from mat and foam pad     Knee/Hip Exercises: Supine   Short Arc Quad Sets Strengthening;Right;Left;15 reps   Short Arc Quad Sets Limitations 3lb    Bridges with [employer] Strengthening;Both;10 reps;2 sets     Shoulder Exercises: Supine   Horizontal ABduction Strengthening;Both;10 reps;Theraband  PT with tactile cues to lengthen back of neck and  depress sh   Theraband Level (Shoulder Horizontal ABduction) Level 3 (Green)   External Rotation Strengthening;10 reps;Both;Theraband  PT with tactile cues to lengthen back of neck and depress sh   Theraband Level (Shoulder External Rotation) Level 3 (Green)                  PT Short Term Goals - 12/31/16 1402      PT SHORT TERM GOAL #1   Title increase Berg Balance assessment to 46/56   Baseline 46 baseline Moderate Fall Risk   Time 4   Period Weeks   Status On-going     PT SHORT TERM GOAL #2   Title independent with initial HEP   Time 4   Period Weeks   Status On-going     PT SHORT TERM GOAL #3   Title 5 x sit to stand < or = to 22 seconds for reduced risk of falls   Time 4   Period  Weeks   Status On-going     PT SHORT TERM GOAL #4   Title TUG < or = to 14 sec   Time 4   Period Weeks   Status On-going           PT Long Term Goals - 12/24/16 1531      PT LONG TERM GOAL #1   Title independent with advanced HEP   Time 8   Period Weeks   Status New     PT LONG TERM GOAL #2   Title 5 x sit to stand < or = to 20 sec without using UE for significant reduction in fall risk   Time 8   Period Weeks   Status New     PT LONG TERM GOAL #3   Title reports experiencing 50% less neck and knee pain due to improved postural stability and LE strength   Time 8   Period Weeks   Status New     PT LONG TERM GOAL #4   Title improved TUG to 13 sec or less without cane for reduced fall risk   Time 8   Period Weeks   Status New     PT LONG TERM GOAL #5   Title Improved Berg score to > or = to 50/56 for reduced risk of falls   Time 8   Period Weeks   Status New               Plan - 12/31/16 1400    Clinical Impression Statement Patient and wife were educated in using tennis ball in sheet to massage upper back.  Pt needs a lot of cues to relax shoulder as well as for improved posture to not lock out knees.  Pt continues to benefit from skilled PT for LE strength, showed improveemnt with LAQ increased reps.   Rehab Potential Good   PT Treatment/Interventions ADLs/Self Care Home Management;Dry needling;Taping;Passive range of motion;Manual techniques;Patient/family education;Neuromuscular re-education;Balance training;Therapeutic exercise;Therapeutic activities;Stair training;Gait training;Ultrasound;Moist Heat;Electrical Stimulation;Cryotherapy;Traction;Biofeedback   PT Next Visit Plan posture, sit to stand, gait, LE strength, balance   Consulted and Agree with Plan of Care Patient      Patient will benefit from skilled therapeutic intervention in order to improve the following deficits and impairments:  Abnormal gait, Decreased balance, Decreased coordination,  Decreased endurance, Difficulty walking, Decreased strength, Postural dysfunction, Pain  Visit Diagnosis: Difficulty in walking, not elsewhere classified  Muscle weakness (generalized)     Problem List Patient Active Problem List  Diagnosis Date Noted  . Osteoarthritis, multiple sites 07/08/2016  . Risk for falls 07/08/2016  . Premature atrial contractions 05/11/2015  . Sepsis ([place]) 04/29/2015  . History of small bowel obstruction 04/29/2015  . History of traumatic brain injury 04/29/2015  . Acute respiratory failure ([place]) 04/29/2015  . CAP (community acquired pneumonia)   . Unspecified cerebral artery occlusion with cerebral infarction 08/25/2012  . Aphasia 08/25/2012  . Macular degeneration 04/25/2012  . CVA (cerebral infarction) 03/20/2012  . Small bowel obstruction ([place]) 09/12/2011  . Nausea & vomiting 09/12/2011  . Memory loss 04/01/2010  . 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 SKIN LESION 10/24/2009  . NEOPLASM, SKIN, UNCERTAIN BEHAVIOR 32/04/2481  . DYSGEUSIA 10/10/2009  . Hyperlipidemia 07/20/2008  . DEPRESSION 07/20/2008  . Essential hypertension 07/20/2008  . RHINITIS 07/20/2008  . COLONIC POLYPS, HX OF 07/20/2008  . GERD 07/18/2008  . PROSTATE CANCER, HX OF 07/18/2008    Zannie Cove, PT 12/31/2016, 2:46 PM  [facility] [address], [place], [place], [address] Phone: [phone]   Fax:  [phone]  Name: JUANA MONTINI MRN: [id number] Date of Birth: 1930/07/25

## 2017-01-06 ENCOUNTER — Ambulatory Visit: Payer: Medicare Other | Attending: Family Medicine | Admitting: Physical Therapy

## 2017-01-06 DIAGNOSIS — R262 Difficulty in walking, not elsewhere classified: Secondary | ICD-10-CM | POA: Insufficient documentation

## 2017-01-06 DIAGNOSIS — M6281 Muscle weakness (generalized): Secondary | ICD-10-CM | POA: Diagnosis not present

## 2017-01-06 NOTE — Therapy (Signed)
[facility] [address] [place], [place], [address] Phone: [phone]   Fax:  [phone]  Physical Therapy Treatment  Patient Details  Name: Eric George MRN: [id number] Date of Birth: Sep 05, 1930 Referring Provider: Eulas Post  Encounter Date: 01/06/2017      PT End of Session - 01/06/17 1525    Visit Number 4   Number of Visits 10   Date for PT Re-Evaluation 02/18/17   PT Start Time 2725   PT Stop Time 1527   PT Time Calculation (min) 40 min   Activity Tolerance Patient tolerated treatment well      Past Medical History:  Diagnosis Date  . Bowel obstruction ([place])   . CAP (community acquired pneumonia)   . 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 GERD (gastroesophageal reflux disease)   . Hydrocephalus   . Hyperlipidemia   . Hypertension   . Osteoarthritis, multiple sites 07/08/2016  . 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 Stroke ([place])   . TBI (traumatic brain injury) [facility])     Past Surgical History:  Procedure Laterality Date  . BRAIN SURGERY    . HERNIA REPAIR    . PROSTATECTOMY    . TEE WITHOUT CARDIOVERSION  03/22/2012   Procedure: TRANSESOPHAGEAL ECHOCARDIOGRAM (TEE);  Surgeon: [name], MD;  Location: [facility] ENDOSCOPY;  Service: Cardiovascular;  Laterality: N/A;    There were no vitals filed for this visit.                       [facility] Adult PT Treatment/Exercise - 01/06/17 0001      Knee/Hip Exercises: Aerobic   Nustep L2 x 10 min     Knee/Hip Exercises: Machines for Strengthening   Cybex Leg Press bilat 70# x 20; single 30# x 20 each side  seat 9     Knee/Hip Exercises: Standing   Hip Flexion Stengthening;Both;Knee bent;20 reps  no UE support   Hip Abduction Stengthening;2 sets;10 reps  one UE support, cues to relax shoulders   Hip Extension Stengthening;Both;10 reps;Knee straight   Forward Step Up  Right;Left;Hand Hold: 1;Hand Hold: 0;5 reps  some pain in Lt knee   SLS 3 x 30 sec each side     Knee/Hip Exercises: Seated   Long Arc Quad Strengthening;Right;Left;Weights;2 sets;10 reps   Long Arc Quad Weight 3 lbs.   Hamstring Curl --   Hamstring Limitations --   Abduction/Adduction  Strengthening;Both;15 reps;2 sets  green band   Sit to Sand 2 sets;5 reps     Shoulder Exercises: Supine   Horizontal ABduction Strengthening;Both;10 reps;Theraband  PT with tactile cues to lengthen back of neck and depress sh   Theraband Level (Shoulder Horizontal ABduction) Level 3 (Green)   External Rotation Strengthening;10 reps;Both;Theraband  PT with tactile cues to lengthen back of neck and depress sh   Theraband Level (Shoulder External Rotation) Level 3 Nyoka Cowden)                  PT Short Term Goals - 01/06/17 1516      PT SHORT TERM GOAL #1   Title increase Berg Balance assessment to 46/56   Time 4   Period Weeks   Status On-going     PT SHORT TERM GOAL #2   Title independent with initial HEP   Time 4   Period Weeks   Status  Achieved     PT SHORT TERM GOAL #3   Title 5 x sit to stand < or = to 22 seconds for reduced risk of falls   Time 4   Period Weeks   Status On-going     PT SHORT TERM GOAL #4   Title TUG < or = to 14 sec   Time 4   Period Weeks   Status On-going           PT Long Term Goals - 01/06/17 1515      PT LONG TERM GOAL #1   Title independent with advanced HEP   Time 8   Period Weeks   Status On-going     PT LONG TERM GOAL #2   Title 5 x sit to stand < or = to 20 sec without using UE for significant reduction in fall risk   Time 8   Period Weeks   Status On-going     PT LONG TERM GOAL #3   Title reports experiencing 50% less neck and knee pain due to improved postural stability and LE strength   Time 8   Period Weeks   Status On-going     PT LONG TERM GOAL #4   Title improved TUG to 13 sec or less without cane for reduced fall  risk   Time 8   Period Weeks   Status On-going               Plan - 01/06/17 1512    Clinical Impression Statement Patient was able to do more reps and added leg press today.  Pt has some diffiulty standing after doing exercises but did not report or have pain during or after, was difficult due to fatigue. Pt was closely monitored throughout.  He needs a lot of cues to keep knees unlocked and prevent elevated shoulders.  Pt will benefit from skilled PT to continue working on strength and balance to reduce risk of falls.   Clinical Impairments Affecting Rehab Potential chronic neck pain   PT Treatment/Interventions ADLs/Self Care Home Management;Dry needling;Taping;Passive range of motion;Manual techniques;Patient/family education;Neuromuscular re-education;Balance training;Therapeutic exercise;Therapeutic activities;Stair training;Gait training;Ultrasound;Moist Heat;Electrical Stimulation;Cryotherapy;Traction;Biofeedback   PT Next Visit Plan posture, sit to stand, gait, LE strength, balance   PT Home Exercise Plan add standing hip exercises and sit to stand   Consulted and Agree with Plan of Care Patient      Patient will benefit from skilled therapeutic intervention in order to improve the following deficits and impairments:  Abnormal gait, Decreased balance, Decreased coordination, Decreased endurance, Difficulty walking, Decreased strength, Postural dysfunction, Pain  Visit Diagnosis: No diagnosis found.     Problem List Patient Active Problem List   Diagnosis Date Noted  . Osteoarthritis, multiple sites 07/08/2016  . Risk for falls 07/08/2016  . Premature atrial contractions 05/11/2015  . Sepsis ([place]) 04/29/2015  . History of small bowel obstruction 04/29/2015  . History of traumatic brain injury 04/29/2015  . Acute respiratory failure ([place]) 04/29/2015  . CAP (community acquired pneumonia)   . Unspecified cerebral artery occlusion with cerebral infarction 08/25/2012  .  Aphasia 08/25/2012  . Macular degeneration 04/25/2012  . CVA (cerebral infarction) 03/20/2012  . Small bowel obstruction ([place]) 09/12/2011  . Nausea & vomiting 09/12/2011  . Memory loss 04/01/2010  . 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 SKIN LESION 10/24/2009  . NEOPLASM, SKIN, UNCERTAIN BEHAVIOR 95/63/8756  . DYSGEUSIA 10/10/2009  . Hyperlipidemia 07/20/2008  . DEPRESSION 07/20/2008  . Essential hypertension 07/20/2008  .  RHINITIS 07/20/2008  . COLONIC POLYPS, HX OF 07/20/2008  . GERD 07/18/2008  . PROSTATE CANCER, HX OF 07/18/2008    Zannie Cove, PT 01/06/2017, 3:30 PM  [facility] [address] [place], [place], [address] Phone: [phone]   Fax:  [phone]  Name: RHYAN RADLER MRN: [id number] Date of Birth: [birth year]

## 2017-01-07 ENCOUNTER — Encounter: Payer: Medicare Other | Admitting: Physical Therapy

## 2017-01-12 ENCOUNTER — Encounter: Payer: Medicare Other | Admitting: Physical Therapy

## 2017-01-14 DIAGNOSIS — K582 Mixed irritable bowel syndrome: Secondary | ICD-10-CM | POA: Diagnosis not present

## 2017-01-14 DIAGNOSIS — K5730 Diverticulosis of large intestine without perforation or abscess without bleeding: Secondary | ICD-10-CM | POA: Diagnosis not present

## 2017-01-14 DIAGNOSIS — R140 Abdominal distension (gaseous): Secondary | ICD-10-CM | POA: Diagnosis not present

## 2017-01-15 ENCOUNTER — Encounter: Payer: Medicare Other | Admitting: Physical Therapy

## 2017-01-17 ENCOUNTER — Encounter (HOSPITAL_COMMUNITY): Payer: Self-pay | Admitting: *Deleted

## 2017-01-17 ENCOUNTER — Emergency Department (HOSPITAL_COMMUNITY)
Admission: EM | Admit: 2017-01-17 | Discharge: 2017-01-17 | Disposition: A | Discharge status: Home / Self Care | Payer: Medicare Other | Attending: Emergency Medicine | Admitting: Emergency Medicine

## 2017-01-17 ENCOUNTER — Emergency Department (HOSPITAL_COMMUNITY): Payer: Medicare Other

## 2017-01-17 DIAGNOSIS — I10 Essential (primary) hypertension: Secondary | ICD-10-CM | POA: Diagnosis not present

## 2017-01-17 DIAGNOSIS — Z85828 Personal history of other malignant neoplasm of skin: Secondary | ICD-10-CM | POA: Diagnosis not present

## 2017-01-17 DIAGNOSIS — R197 Diarrhea, unspecified: Secondary | ICD-10-CM

## 2017-01-17 DIAGNOSIS — R4781 Slurred speech: Secondary | ICD-10-CM | POA: Diagnosis not present

## 2017-01-17 DIAGNOSIS — I6789 Other cerebrovascular disease: Secondary | ICD-10-CM | POA: Diagnosis not present

## 2017-01-17 DIAGNOSIS — Z87891 Personal history of nicotine dependence: Secondary | ICD-10-CM | POA: Insufficient documentation

## 2017-01-17 DIAGNOSIS — K5900 Constipation, unspecified: Secondary | ICD-10-CM | POA: Diagnosis not present

## 2017-01-17 DIAGNOSIS — R109 Unspecified abdominal pain: Secondary | ICD-10-CM | POA: Diagnosis not present

## 2017-01-17 DIAGNOSIS — Z7982 Long term (current) use of aspirin: Secondary | ICD-10-CM | POA: Insufficient documentation

## 2017-01-17 DIAGNOSIS — Z8546 Personal history of malignant neoplasm of prostate: Secondary | ICD-10-CM | POA: Insufficient documentation

## 2017-01-17 DIAGNOSIS — Z79899 Other long term (current) drug therapy: Secondary | ICD-10-CM | POA: Insufficient documentation

## 2017-01-17 LAB — COMPREHENSIVE METABOLIC PANEL
ALT: 15 U/L — ABNORMAL LOW (ref 17–63)
AST: 16 U/L (ref 15–41)
Albumin: 3.7 g/dL (ref 3.5–5.0)
Alkaline Phosphatase: 73 U/L (ref 38–126)
Anion gap: 5 (ref 5–15)
BUN: 24 mg/dL — ABNORMAL HIGH (ref 6–20)
CO2: 19 mmol/L — ABNORMAL LOW (ref 22–32)
Calcium: 9.2 mg/dL (ref 8.9–10.3)
Chloride: 111 mmol/L (ref 101–111)
Creatinine, Ser: 1 mg/dL (ref 0.61–1.24)
GFR calc Af Amer: >60 mL/min (ref >60)
GFR calc non Af Amer: >60 mL/min (ref >60)
Glucose, Bld: 106 mg/dL — ABNORMAL HIGH (ref 65–99)
Potassium: 4.5 mmol/L (ref 3.5–5.1)
Sodium: 135 mmol/L (ref 135–145)
Total Bilirubin: 0.6 mg/dL (ref 0.3–1.2)
Total Protein: 6.4 g/dL — ABNORMAL LOW (ref 6.5–8.1)

## 2017-01-17 LAB — CBC
HCT: 41.8 % (ref 39.0–52.0)
Hemoglobin: 14.3 g/dL (ref 13.0–17.0)
MCH: 29.5 pg (ref 26.0–34.0)
MCHC: 34.2 g/dL (ref 30.0–36.0)
MCV: 86.2 fL (ref 78.0–100.0)
Platelets: 213 10*3/uL (ref 150–400)
RBC: 4.85 MIL/uL (ref 4.22–5.81)
RDW: 13.2 % (ref 11.5–15.5)
WBC: 10 10*3/uL (ref 4.0–10.5)

## 2017-01-17 LAB — LIPASE, BLOOD: Lipase: 29 U/L (ref 11–51)

## 2017-01-17 LAB — URINALYSIS, ROUTINE W REFLEX MICROSCOPIC
Bilirubin Urine: NEGATIVE
Glucose, UA: NEGATIVE mg/dL
Hgb urine dipstick: NEGATIVE
Ketones, ur: NEGATIVE mg/dL
Leukocytes, UA: NEGATIVE
Nitrite: NEGATIVE
Protein, ur: NEGATIVE mg/dL
Specific Gravity, Urine: 1.018 (ref 1.005–1.030)
pH: 5 (ref 5.0–8.0)

## 2017-01-17 MED ORDER — IOPAMIDOL (ISOVUE-300) INJECTION 61%
INTRAVENOUS | Status: AC
  Filled 2017-01-17: qty 100

## 2017-01-17 MED ORDER — SODIUM CHLORIDE 0.9 % IV BOLUS (SEPSIS)
1000.0000 mL | Freq: Once | INTRAVENOUS | Status: AC
  Administered 2017-01-17: 1000 mL via INTRAVENOUS

## 2017-01-17 MED ORDER — IOPAMIDOL (ISOVUE-300) INJECTION 61%
100.0000 mL | Freq: Once | INTRAVENOUS | Status: AC | PRN
  Administered 2017-01-17: 100 mL via INTRAVENOUS

## 2017-01-17 NOTE — ED Notes (Signed)
Bed: DK44 Expected date:  Expected time:  Means of arrival:  Comments: 83 yo constipation

## 2017-01-17 NOTE — ED Triage Notes (Signed)
EMS reports pt has not had a BM since Wed, has been evaluated and taken a laxative without results. Has history of SBO. VS 110/58- 67- 18- 96% RA

## 2017-01-17 NOTE — ED Provider Notes (Signed)
[facility] DEPT Provider Note   CSN: [id number] Arrival date & time: 01/17/17  1149     History   Chief Complaint Chief Complaint  Patient presents with  . Fecal Impaction    HPI Eric George is a 83 y.o. male.  Pt presents to the ED with constipation.  He has not had a bowel movement since Wed, the 12th.  Prior to that, pt had diarrhea.  He also has a hx of sbo.  He denies any n/v.  No f/c.  Pt's wife said he went to his pcp's office on Thursday the 14th.  He was put on linzess.  This has not helped.  This morning, pt looked pale and weak.  She was afraid he was going to pass out.  This is what prompted her to call EMS.      Past Medical History:  Diagnosis Date  . Bowel obstruction ([place])   . CAP (community acquired pneumonia)   . 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 GERD (gastroesophageal reflux disease)   . Hydrocephalus   . Hyperlipidemia   . Hypertension   . Osteoarthritis, multiple sites 07/08/2016  . 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 Stroke ([place])   . TBI (traumatic brain injury) [facility])     Patient Active Problem List   Diagnosis Date Noted  . Osteoarthritis, multiple sites 07/08/2016  . Risk for falls 07/08/2016  . Premature atrial contractions 05/11/2015  . Sepsis ([place]) 04/29/2015  . History of small bowel obstruction 04/29/2015  . History of traumatic brain injury 04/29/2015  . Acute respiratory failure ([place]) 04/29/2015  . CAP (community acquired pneumonia)   . Unspecified cerebral artery occlusion with cerebral infarction 08/25/2012  . Aphasia 08/25/2012  . Macular degeneration 04/25/2012  . CVA (cerebral infarction) 03/20/2012  . Small bowel obstruction ([place]) 09/12/2011  . Nausea & vomiting 09/12/2011  . Memory loss 04/01/2010  . 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 SKIN LESION 10/24/2009  . NEOPLASM, SKIN, UNCERTAIN BEHAVIOR 51/06/5850  . DYSGEUSIA 10/10/2009  . Hyperlipidemia  07/20/2008  . DEPRESSION 07/20/2008  . Essential hypertension 07/20/2008  . RHINITIS 07/20/2008  . COLONIC POLYPS, HX OF 07/20/2008  . GERD 07/18/2008  . PROSTATE CANCER, HX OF 07/18/2008    Past Surgical History:  Procedure Laterality Date  . BRAIN SURGERY    . HERNIA REPAIR    . PROSTATECTOMY    . TEE WITHOUT CARDIOVERSION  03/22/2012   Procedure: TRANSESOPHAGEAL ECHOCARDIOGRAM (TEE);  Surgeon: [name], MD;  Location: [facility] ENDOSCOPY;  Service: Cardiovascular;  Laterality: N/A;       Home Medications    Prior to Admission medications   Medication Sig Start Date End Date Taking? Authorizing Provider  acetaminophen (TYLENOL) 325 MG tablet Take 650 mg by mouth every 6 (six) hours as needed.   Yes [provider]  acetic acid-hydrocortisone (VOSOL-HC) otic solution Place 5 drops into both ears every 30 (thirty) days.  02/06/14  Yes [provider]  aspirin 325 MG tablet Take 1 tablet (325 mg total) by mouth daily. 03/23/12  Yes Eugenie Filler, MD  atorvastatin (LIPITOR) 40 MG tablet TAKE 1 TABLET DAILY AT 6 P.M. 07/29/16  Yes Burchette, Alinda Sierras, MD  carvedilol (COREG) 3.125 MG tablet TAKE 1 TABLET TWICE A DAY WITH MEALS 04/16/16  Yes Burchette, Alinda Sierras, MD  irbesartan (AVAPRO) 150 MG tablet TAKE 1 TABLET DAILY 11/02/16  Yes  Burchette, Alinda Sierras, MD  Linaclotide (LINZESS PO) Take 290 mcg by mouth daily before breakfast.   Yes [provider]  Multiple Vitamins-Minerals (MACULAR VITAMIN BENEFIT) TABS Take 1 capsule by mouth daily.   Yes [provider]  omeprazole (PRILOSEC) 40 MG capsule Take 40 mg by mouth daily.   Yes [provider]  Probiotic Product (ALIGN) 4 MG CAPS Take 1 capsule by mouth daily.   Yes [provider]  sertraline (ZOLOFT) 100 MG tablet TAKE 1 TABLET DAILY 09/30/16  Yes Burchette, Alinda Sierras, MD    Family History Family History  Problem Relation Age of Onset  . Heart disease Sister     Social  History Social History  Substance Use Topics  . Smoking status: Former Smoker    Packs/day: 0.50    Years: 15.00    Types: Cigarettes    Quit date: 09/30/1966  . Smokeless tobacco: Never Used  . Alcohol use No     Allergies   Penicillins   Review of Systems Review of Systems  Gastrointestinal: Positive for constipation.  All other systems reviewed and are negative.    Physical Exam Updated Vital Signs BP 125/78 (BP Location: Left Arm)   Pulse 67   Temp 97.8 F (36.6 C) (Oral)   Resp 14   Ht 6\' 3"  (1.905 m)   Wt 81.6 kg (180 lb)   SpO2 95%   BMI 22.50 kg/m   Physical Exam  Constitutional: He is oriented to person, place, and time. He appears well-developed and well-nourished.  HENT:  Head: Normocephalic and atraumatic.  Right Ear: External ear normal.  Left Ear: External ear normal.  Nose: Nose normal.  Mouth/Throat: Oropharynx is clear and moist.  Eyes: Pupils are equal, round, and reactive to light. Conjunctivae and EOM are normal.  Neck: Normal range of motion. Neck supple.  Cardiovascular: Normal rate, regular rhythm, normal heart sounds and intact distal pulses.   Pulmonary/Chest: Effort normal and breath sounds normal.  Abdominal: Soft. Bowel sounds are normal.  Genitourinary: Rectum normal.  Genitourinary Comments: No fecal impaction  Musculoskeletal: Normal range of motion.  Neurological: He is alert and oriented to person, place, and time.  Skin: Skin is warm.  Psychiatric: He has a normal mood and affect. His behavior is normal. Judgment and thought content normal.  Nursing note and vitals reviewed.    ED Treatments / Results  Labs (all labs ordered are listed, but only abnormal results are displayed) Labs Reviewed  COMPREHENSIVE METABOLIC PANEL - Abnormal; Notable for the following:       Result Value   CO2 19 (*)    Glucose, Bld 106 (*)    BUN 24 (*)    Total Protein 6.4 (*)    ALT 15 (*)    All other components within normal limits    LIPASE, BLOOD  CBC  URINALYSIS, ROUTINE W REFLEX MICROSCOPIC    EKG  EKG Interpretation None       Radiology Ct Abdomen Pelvis W Contrast  Result Date: 01/17/2017 CLINICAL DATA:  History of small bowel obstruction.  Abdominal pain. EXAM: CT ABDOMEN AND PELVIS WITH CONTRAST TECHNIQUE: Multidetector CT imaging of the abdomen and pelvis was performed using the standard protocol following bolus administration of intravenous contrast. CONTRAST:  176mL ISOVUE-300 IOPAMIDOL (ISOVUE-300) INJECTION 61% COMPARISON:  Radiography same day.  CT 04/29/2015 FINDINGS: Lower chest: Negative Hepatobiliary: Liver parenchyma is normal. Diminutive left lobe containing a 7 mm cyst. No calcified gallstones. No ductal dilatation. Pancreas:  Normal Spleen: Normal Adrenals/Urinary Tract: Adrenal glands are normal. Right kidney is normal except for mild scarring in the lower pole and a subcentimeter cyst in the midportion. Left kidney shows a nonenhancing hyperdense cyst at the upper pole measuring 2 cm in diameter in a few other tiny cysts. No hydronephrosis. The bladder is normal. Stomach/Bowel: No convincing small bowel dilatation or definite transition zone. Mild prominence of the proximal small bowel, but not convincingly pathologic. Colon appears normal except for the presence of diverticulosis without evidence of diverticulitis. Vascular/Lymphatic: Aortic atherosclerosis. No aneurysm. IVC is normal. No retroperitoneal mass or adenopathy. Reproductive: Previous prostatectomy. Other: No free fluid or air. Musculoskeletal: Ordinary mild spinal degenerative changes. IMPRESSION: No convincing CT evidence of small-bowel obstruction at this time. Very minimal prominence of the proximal small bowel compared to the distal small bowel, but felt to be within normal limits. The colon appears unremarkable except for the presence of diverticulosis without evidence of diverticulitis. Aortic atherosclerosis. Previous prostatectomy.  Electronically Signed   By: Nelson Chimes M.D.   On: 01/17/2017 13:55   Dg Abdomen Acute W/chest  Result Date: 01/17/2017 CLINICAL DATA:  Constipation for 5 days. EXAM: DG ABDOMEN ACUTE W/ 1V CHEST COMPARISON:  June 12, 2015 FINDINGS: No acute abnormalities identified in the chest. No free air, portal venous gas, or pneumatosis. Air-fluid levels are seen throughout the abdomen, particularly on the right. There is a paucity of bowel gas. No definitive bowel dilatation is noted. Surgical clips are seen in the pelvis. No other acute abnormalities identified. IMPRESSION: 1. Air-fluid levels are seen within right-sided bowel loops, probably colon, on upright imaging. This is a nonspecific finding. The colon appears to be nondilated on supine imaging. If there is continued concern, a CT scan could better evaluate. 2. No other acute abnormalities. Electronically Signed   By: Dorise Bullion III M.D   On: 01/17/2017 12:57    Procedures Procedures (including critical care time)  Medications Ordered in ED Medications  iopamidol (ISOVUE-300) 61 % injection (not administered)  sodium chloride 0.9 % bolus 1,000 mL (0 mLs Intravenous Stopped 01/17/17 1412)  iopamidol (ISOVUE-300) 61 % injection 100 mL (100 mLs Intravenous Contrast Given 01/17/17 1332)     Initial Impression / Assessment and Plan / ED Course  I have reviewed the triage vital signs and the nursing notes.  Pertinent labs & imaging results that were available during my care of the patient were reviewed by me and considered in my medical decision making (see chart for details).    No SBO.  No constipation.  Lack of stool likely due to diarrhea cleaning out his colon a few days ago.  Pt does not have any pain.  No n/v.  I told his wife to give him extra fiber to bulk up his stool.  Pt knows to return if worse. Final Clinical Impressions(s) / ED Diagnoses   Final diagnoses:  Diarrhea, unspecified type    New Prescriptions New  Prescriptions   No medications on file     Isla [name], MD 01/17/17 1419

## 2017-01-17 NOTE — Discharge Instructions (Signed)
Add fiber to diet.

## 2017-01-17 NOTE — ED Notes (Signed)
Bedside commode at bedside. Pt tried to have BM but was unsuccessful.

## 2017-01-19 ENCOUNTER — Ambulatory Visit: Payer: Medicare Other | Admitting: Physical Therapy

## 2017-01-19 DIAGNOSIS — M6281 Muscle weakness (generalized): Secondary | ICD-10-CM | POA: Diagnosis not present

## 2017-01-19 DIAGNOSIS — R262 Difficulty in walking, not elsewhere classified: Secondary | ICD-10-CM | POA: Diagnosis not present

## 2017-01-19 NOTE — Therapy (Signed)
[facility] [address], [place], [place], [address] Phone: [phone]   Fax:  [phone]  Physical Therapy Treatment  Patient Details  Name: Eric George MRN: [id number] Date of Birth: Mar 20, 1931 Referring Provider: Eulas Post  Encounter Date: 01/19/2017      PT End of Session - 01/19/17 1430    Visit Number 5   Number of Visits 10   Date for PT Re-Evaluation 02/18/17   PT Start Time 1401   PT Stop Time 9833   PT Time Calculation (min) 41 min   Activity Tolerance Patient tolerated treatment well;No increased pain   Behavior During Therapy WFL for tasks assessed/performed      Past Medical History:  Diagnosis Date  . Bowel obstruction ([place])   . CAP (community acquired pneumonia)   . 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 GERD (gastroesophageal reflux disease)   . Hydrocephalus   . Hyperlipidemia   . Hypertension   . Osteoarthritis, multiple sites 07/08/2016  . 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 Stroke ([place])   . TBI (traumatic brain injury) [facility])     Past Surgical History:  Procedure Laterality Date  . BRAIN SURGERY    . HERNIA REPAIR    . PROSTATECTOMY    . TEE WITHOUT CARDIOVERSION  03/22/2012   Procedure: TRANSESOPHAGEAL ECHOCARDIOGRAM (TEE);  Surgeon: [name], MD;  Location: [facility] ENDOSCOPY;  Service: Cardiovascular;  Laterality: N/A;    There were no vitals filed for this visit.      Subjective Assessment - 01/19/17 1406    Subjective Pt's wife reports that he has not been feeling well the past couple of days. They were still encouraged to come to PT by their MD, but he might need some rest breaks.    Patient is accompained by: Family member  wife   Pertinent History chronic neck pain   Limitations Other (comment);Walking;Standing  looking   Patient Stated Goals going up and down steps, increased endurance, more steadiness    Currently in Pain? No/denies                         [facility] Adult PT Treatment/Exercise - 01/19/17 0001      Exercises   Exercises Other Exercises   Other Exercises  sideyling clamshells x15 reps each, green TB      Knee/Hip Exercises: Aerobic   Nustep L1 x2 min, L2 x2 min, L3 x2 min, above 80 SPM     Knee/Hip Exercises: Machines for Strengthening   Cybex Knee Extension BLE 2x10 reps, 20#   Cybex Knee Flexion BLE 2x15 reps, 15#   Cybex Leg Press seat 9, BLE 2x10 reps, 80#     Knee/Hip Exercises: Standing   Heel Raises 2 sets;15 reps;Both     Knee/Hip Exercises: Seated   Marching Limitations 2x15    Marching Weights 4 lbs.   Sit to Sand 2 sets;10 reps;without UE support  sitting on foam pad for last 5 of each set      Knee/Hip Exercises: Supine   Bridges Limitations 2x15                 PT Education - 01/19/17 1427    Education provided Yes   Education Details technique with therex; encouraged pt to gradually increase his walking distance in order to build his endurance and activity  tolerance.    Person(s) Educated Patient;Spouse   Methods Explanation;Tactile cues;Verbal cues   Comprehension Returned demonstration;Verbalized understanding          PT Short Term Goals - 01/06/17 1516      PT SHORT TERM GOAL #1   Title increase Berg Balance assessment to 46/56   Time 4   Period Weeks   Status On-going     PT SHORT TERM GOAL #2   Title independent with initial HEP   Time 4   Period Weeks   Status Achieved     PT SHORT TERM GOAL #3   Title 5 x sit to stand < or = to 22 seconds for reduced risk of falls   Time 4   Period Weeks   Status On-going     PT SHORT TERM GOAL #4   Title TUG < or = to 14 sec   Time 4   Period Weeks   Status On-going           PT Long Term Goals - 01/06/17 1515      PT LONG TERM GOAL #1   Title independent with advanced HEP   Time 8   Period Weeks   Status On-going     PT LONG TERM GOAL #2    Title 5 x sit to stand < or = to 20 sec without using UE for significant reduction in fall risk   Time 8   Period Weeks   Status On-going     PT LONG TERM GOAL #3   Title reports experiencing 50% less neck and knee pain due to improved postural stability and LE strength   Time 8   Period Weeks   Status On-going     PT LONG TERM GOAL #4   Title improved TUG to 13 sec or less without cane for reduced fall risk   Time 8   Period Weeks   Status On-going               Plan - 01/19/17 1431    Clinical Impression Statement Today's session focused on slight progressions of therex to promote LE strength and endurance. Pt demonstrated signs of fatigue and muscle shaking with several exercises with increased resistance, however he did not report knee pain throughout the session. Ended with the nustep to promote endurance and therapist was present to encourage gradual increase in resistance. Will continue with current POC.    Clinical Impairments Affecting Rehab Potential chronic neck pain   PT Treatment/Interventions ADLs/Self Care Home Management;Dry needling;Taping;Passive range of motion;Manual techniques;Patient/family education;Neuromuscular re-education;Balance training;Therapeutic exercise;Therapeutic activities;Stair training;Gait training;Ultrasound;Moist Heat;Electrical Stimulation;Cryotherapy;Traction;Biofeedback   PT Next Visit Plan posture, progress LE strength, gait, balance   PT Home Exercise Plan add standing hip exercises and sit to stand   Consulted and Agree with Plan of Care Patient      Patient will benefit from skilled therapeutic intervention in order to improve the following deficits and impairments:  Abnormal gait, Decreased balance, Decreased coordination, Decreased endurance, Difficulty walking, Decreased strength, Postural dysfunction, Pain  Visit Diagnosis: Difficulty in walking, not elsewhere classified  Muscle weakness (generalized)     Problem  List Patient Active Problem List   Diagnosis Date Noted  . Osteoarthritis, multiple sites 07/08/2016  . Risk for falls 07/08/2016  . Premature atrial contractions 05/11/2015  . Sepsis ([place]) 04/29/2015  . History of small bowel obstruction 04/29/2015  . History of traumatic brain injury 04/29/2015  . Acute respiratory failure ([place]) 04/29/2015  .  CAP (community acquired pneumonia)   . Unspecified cerebral artery occlusion with cerebral infarction 08/25/2012  . Aphasia 08/25/2012  . Macular degeneration 04/25/2012  . CVA (cerebral infarction) 03/20/2012  . Small bowel obstruction ([place]) 09/12/2011  . Nausea & vomiting 09/12/2011  . Memory loss 04/01/2010  . 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 SKIN LESION 10/24/2009  . NEOPLASM, SKIN, UNCERTAIN BEHAVIOR 16/02/9603  . DYSGEUSIA 10/10/2009  . Hyperlipidemia 07/20/2008  . DEPRESSION 07/20/2008  . Essential hypertension 07/20/2008  . RHINITIS 07/20/2008  . COLONIC POLYPS, HX OF 07/20/2008  . GERD 07/18/2008  . PROSTATE CANCER, HX OF 07/18/2008   2:42 PM,01/19/17 Elly Modena PT, DPT [place] [facility] [place] [facility] [address], [place], [place], [address] Phone: [phone]   Fax:  [phone]  Name: Eric George MRN: [id number] Date of Birth: Jan 12, 1931

## 2017-01-21 ENCOUNTER — Encounter: Payer: Self-pay | Admitting: Family Medicine

## 2017-01-22 ENCOUNTER — Encounter: Payer: Medicare Other | Admitting: Physical Therapy

## 2017-01-26 ENCOUNTER — Encounter: Payer: Medicare Other | Admitting: Physical Therapy

## 2017-01-29 ENCOUNTER — Encounter: Payer: Medicare Other | Admitting: Physical Therapy

## 2017-02-02 ENCOUNTER — Ambulatory Visit: Payer: Medicare Other | Attending: Family Medicine | Admitting: Physical Therapy

## 2017-02-02 DIAGNOSIS — M6281 Muscle weakness (generalized): Secondary | ICD-10-CM | POA: Insufficient documentation

## 2017-02-02 DIAGNOSIS — R262 Difficulty in walking, not elsewhere classified: Secondary | ICD-10-CM

## 2017-02-02 NOTE — Therapy (Signed)
[facility] [address], [place], [place], [address] Phone: [phone]   Fax:  [phone]  Physical Therapy Treatment  Patient Details  Name: Eric George MRN: [id number] Date of Birth: 08-28-1930 Referring Provider: Eulas Post  Encounter Date: 02/02/2017      PT End of Session - 02/02/17 1409    Visit Number 6   Number of Visits 10   Date for PT Re-Evaluation 02/18/17   PT Start Time 7408   PT Stop Time 1444   PT Time Calculation (min) 40 min   Activity Tolerance Patient tolerated treatment well;No increased pain   Behavior During Therapy WFL for tasks assessed/performed      Past Medical History:  Diagnosis Date  . Bowel obstruction ([place])   . CAP (community acquired pneumonia)   . 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 GERD (gastroesophageal reflux disease)   . Hydrocephalus   . Hyperlipidemia   . Hypertension   . Osteoarthritis, multiple sites 07/08/2016  . 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 Stroke ([place])   . TBI (traumatic brain injury) [facility])     Past Surgical History:  Procedure Laterality Date  . BRAIN SURGERY    . HERNIA REPAIR    . PROSTATECTOMY    . TEE WITHOUT CARDIOVERSION  03/22/2012   Procedure: TRANSESOPHAGEAL ECHOCARDIOGRAM (TEE);  Surgeon: [name], MD;  Location: [facility] ENDOSCOPY;  Service: Cardiovascular;  Laterality: N/A;    There were no vitals filed for this visit.      Subjective Assessment - 02/02/17 1405    Subjective Pt reports that things are going well. He has no complaints at this time.    Patient is accompained by: Family member  wife   Pertinent History chronic neck pain   Limitations Other (comment);Walking;Standing  looking   Patient Stated Goals going up and down steps, increased endurance, more steadiness   Currently in Pain? No/denies                         [facility] Adult PT  Treatment/Exercise - 02/02/17 0001      Exercises   Exercises Knee/Hip   Other Exercises  Sidelying hip abduction with slight hip extension x10 reps each     Knee/Hip Exercises: Aerobic   Nustep L3 x55min, increased to L4 x2 min      Knee/Hip Exercises: Standing   Heel Raises 20 reps;Both;2 sets   Hip Extension Both;2 sets;10 reps   Extension Limitations cues to decrease knee flexion tendency    Forward Step Up Both;1 set;10 reps;Hand Hold: 1;Step Height: 4"     Knee/Hip Exercises: Seated   Long Arc Quad Both;2 sets;15 reps   Long Arc Quad Weight 5 lbs.   Sit to Sand 2 sets;10 reps  1st set hand on thighs; 2nd set seat elevated     Knee/Hip Exercises: Prone   Hamstring Curl 1 set;10 reps   Hamstring Curl Limitations double red TB                PT Education - 02/02/17 1417    Education provided Yes   Education Details technique with therex; provided HEP and discussed importance of completing on days he is not in therapy   Person(s) Educated Patient;Spouse   Methods Explanation;Tactile cues;Verbal cues;Handout   Comprehension Returned demonstration;Verbalized understanding  PT Short Term Goals - 01/06/17 1516      PT SHORT TERM GOAL #1   Title increase Berg Balance assessment to 46/56   Time 4   Period Weeks   Status On-going     PT SHORT TERM GOAL #2   Title independent with initial HEP   Time 4   Period Weeks   Status Achieved     PT SHORT TERM GOAL #3   Title 5 x sit to stand < or = to 22 seconds for reduced risk of falls   Time 4   Period Weeks   Status On-going     PT SHORT TERM GOAL #4   Title TUG < or = to 14 sec   Time 4   Period Weeks   Status On-going           PT Long Term Goals - 01/06/17 1515      PT LONG TERM GOAL #1   Title independent with advanced HEP   Time 8   Period Weeks   Status On-going     PT LONG TERM GOAL #2   Title 5 x sit to stand < or = to 20 sec without using UE for significant reduction in fall  risk   Time 8   Period Weeks   Status On-going     PT LONG TERM GOAL #3   Title reports experiencing 50% less neck and knee pain due to improved postural stability and LE strength   Time 8   Period Weeks   Status On-going     PT LONG TERM GOAL #4   Title improved TUG to 13 sec or less without cane for reduced fall risk   Time 8   Period Weeks   Status On-going               Plan - 02/02/17 1431    Clinical Impression Statement Pt arrived today after being unable to come to PT for the past 2 weeks. Session focused on progressing LE strengthening therex and establishing a HEP for him to complete on the days he is not coming to therapy. Pt demonstrating significant glute weakness with noted trendelenburg posturing during ambulation and other standing exercises. He did require intermittent rest breaks, but reported no increase in pain by the end of the session.   Clinical Impairments Affecting Rehab Potential chronic neck pain   PT Treatment/Interventions ADLs/Self Care Home Management;Dry needling;Taping;Passive range of motion;Manual techniques;Patient/family education;Neuromuscular re-education;Balance training;Therapeutic exercise;Therapeutic activities;Stair training;Gait training;Ultrasound;Moist Heat;Electrical Stimulation;Cryotherapy;Traction;Biofeedback   PT Next Visit Plan posture, progress LE strength, gait, balance   PT Home Exercise Plan sit to stand, standing hip extension, B heel raises, sidestepping with green TB around knees    Recommended Other Services MD signed orders    Consulted and Agree with Plan of Care Patient      Patient will benefit from skilled therapeutic intervention in order to improve the following deficits and impairments:  Abnormal gait, Decreased balance, Decreased coordination, Decreased endurance, Difficulty walking, Decreased strength, Postural dysfunction, Pain  Visit Diagnosis: Muscle weakness (generalized)  Difficulty in walking, not  elsewhere classified     Problem List Patient Active Problem List   Diagnosis Date Noted  . Osteoarthritis, multiple sites 07/08/2016  . Risk for falls 07/08/2016  . Premature atrial contractions 05/11/2015  . Sepsis ([place]) 04/29/2015  . History of small bowel obstruction 04/29/2015  . History of traumatic brain injury 04/29/2015  . Acute respiratory failure ([place]) 04/29/2015  .  CAP (community acquired pneumonia)   . Unspecified cerebral artery occlusion with cerebral infarction 08/25/2012  . Aphasia 08/25/2012  . Macular degeneration 04/25/2012  . CVA (cerebral infarction) 03/20/2012  . Small bowel obstruction ([place]) 09/12/2011  . Nausea & vomiting 09/12/2011  . Memory loss 04/01/2010  . 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 SKIN LESION 10/24/2009  . NEOPLASM, SKIN, UNCERTAIN BEHAVIOR 68/61/6837  . DYSGEUSIA 10/10/2009  . Hyperlipidemia 07/20/2008  . DEPRESSION 07/20/2008  . Essential hypertension 07/20/2008  . RHINITIS 07/20/2008  . COLONIC POLYPS, HX OF 07/20/2008  . GERD 07/18/2008  . PROSTATE CANCER, HX OF 07/18/2008    2:46 PM,02/02/17 Elly Modena PT, DPT [place] [facility] [place] [facility] [address], [place], [place], [address] Phone: [phone]   Fax:  [phone]  Name: Eric George MRN: [id number] Date of Birth: 12/01/1930

## 2017-02-05 ENCOUNTER — Encounter: Payer: Self-pay | Admitting: Family Medicine

## 2017-02-05 ENCOUNTER — Encounter: Payer: Medicare Other | Admitting: Physical Therapy

## 2017-02-05 ENCOUNTER — Ambulatory Visit (INDEPENDENT_AMBULATORY_CARE_PROVIDER_SITE_OTHER): Payer: Medicare Other | Admitting: Family Medicine

## 2017-02-05 VITALS — BP 115/60 | HR 66 | Temp 97.8°F | Wt 193.5 lb

## 2017-02-05 DIAGNOSIS — R42 Dizziness and giddiness: Secondary | ICD-10-CM | POA: Diagnosis not present

## 2017-02-05 DIAGNOSIS — K219 Gastro-esophageal reflux disease without esophagitis: Secondary | ICD-10-CM

## 2017-02-05 DIAGNOSIS — Z23 Encounter for immunization: Secondary | ICD-10-CM | POA: Diagnosis not present

## 2017-02-05 DIAGNOSIS — R197 Diarrhea, unspecified: Secondary | ICD-10-CM

## 2017-02-05 NOTE — Patient Instructions (Signed)
Hold Avalide for systolic blood pressure < 110. Continue with fiber supplement Reduce Miralax from daily- and consider three times per week such as Monday, Wednesday, and Friday.

## 2017-02-05 NOTE — Progress Notes (Signed)
Subjective:     Patient ID: Eric George, male   DOB: November 25, 1930, 83 y.o.   MRN: [id number]  HPI Patient has long-standing history of GI issues including recurrent small bowel construction, GERD, and chronic diarrhea. He is followed by GI. He was placed on Linzess around early part of September and developed some diarrhea and weakness. He went to emergency room for further evaluation. His labs were relatively unremarkable. CT abdomen pelvis revealed no evidence for small bowel obstruction and no acute findings. CBC was normal. He was told to take some extra fiber to try to bulk up his stools. They're currently using Benefiber supplement but low dosage.  He's having about 2 loose stools per day. No bloody stools. No fevers or chills. Increased bloating and gas symptoms. No nausea or vomiting. Wife is apparently still giving MiraLAX daily but about half dosage. He's also had some lightheadedness with standing. Home systolic blood pressures around 110. He takes   Avapro and low-dose carvedilol.  Past Medical History:  Diagnosis Date  . Bowel obstruction ([place])   . CAP (community acquired pneumonia)   . 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 GERD (gastroesophageal reflux disease)   . Hydrocephalus   . Hyperlipidemia   . Hypertension   . Osteoarthritis, multiple sites 07/08/2016  . 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 Stroke ([place])   . TBI (traumatic brain injury) [facility])    Past Surgical History:  Procedure Laterality Date  . BRAIN SURGERY    . HERNIA REPAIR    . PROSTATECTOMY    . TEE WITHOUT CARDIOVERSION  03/22/2012   Procedure: TRANSESOPHAGEAL ECHOCARDIOGRAM (TEE);  Surgeon: [name], MD;  Location: [facility] ENDOSCOPY;  Service: Cardiovascular;  Laterality: N/A;    reports that he quit smoking about 50 years ago. His smoking use included Cigarettes. He has a 7.50 pack-year smoking history. He has never used smokeless tobacco. He reports  that he does not drink alcohol or use drugs. family history includes Heart disease in his sister. Allergies  Allergen Reactions  . Penicillins Rash       Review of Systems  Constitutional: Positive for fatigue. Negative for chills and fever.  Respiratory: Negative for shortness of breath.   Cardiovascular: Negative for chest pain.  Gastrointestinal: Negative for nausea and vomiting.  Neurological: Positive for dizziness and light-headedness. Negative for syncope.       Objective:   Physical Exam  Constitutional: He appears well-developed and well-nourished.  Neck: Neck supple.  Cardiovascular: Normal rate and regular rhythm.   Pulmonary/Chest: Effort normal and breath sounds normal. No respiratory distress. He has no wheezes. He has no rales.  Abdominal: Soft. Bowel sounds are normal. He exhibits no distension. There is no tenderness. There is no rebound and no guarding.  Musculoskeletal: He exhibits no edema.  Neurological: Coordination normal.       Assessment:     #1 long history of constipation now presenting with some loose stools in patient taking daily MiraLAX. Now off Linzess  #2 intermittent lightheadedness. Blood pressure today 115/60.  No significant orthostasis.  #3 history of GERD      Plan:     -Continue fiber supplement -Decrease MiraLAX to regimen of Monday, Wednesday, Friday -Hold Avalide for systolic blood pressure less than 110 -Stay well-hydrated -Continue physical therapy as tolerated -Flu vaccine given  Eulas Post MD [facility]

## 2017-02-09 ENCOUNTER — Ambulatory Visit: Payer: Medicare Other | Admitting: Physical Therapy

## 2017-02-09 DIAGNOSIS — R262 Difficulty in walking, not elsewhere classified: Secondary | ICD-10-CM | POA: Diagnosis not present

## 2017-02-09 DIAGNOSIS — M6281 Muscle weakness (generalized): Secondary | ICD-10-CM | POA: Diagnosis not present

## 2017-02-09 NOTE — Therapy (Signed)
[facility] [address], [place], [place], [address] Phone: [phone]   Fax:  [phone]  Physical Therapy Treatment  Patient Details  Name: Eric George MRN: [id number] Date of Birth: 10-05-1930 Referring Provider: Eulas Post  Encounter Date: 02/09/2017      PT End of Session - 02/09/17 1410    Visit Number 7   Number of Visits 10   Date for PT Re-Evaluation 02/18/17   PT Start Time 9798   PT Stop Time 9211   PT Time Calculation (min) 41 min   Activity Tolerance Patient tolerated treatment well;No increased pain   Behavior During Therapy WFL for tasks assessed/performed      Past Medical History:  Diagnosis Date  . Bowel obstruction ([place])   . CAP (community acquired pneumonia)   . 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 GERD (gastroesophageal reflux disease)   . Hydrocephalus   . Hyperlipidemia   . Hypertension   . Osteoarthritis, multiple sites 07/08/2016  . 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 Stroke ([place])   . TBI (traumatic brain injury) [facility])     Past Surgical History:  Procedure Laterality Date  . BRAIN SURGERY    . HERNIA REPAIR    . PROSTATECTOMY    . TEE WITHOUT CARDIOVERSION  03/22/2012   Procedure: TRANSESOPHAGEAL ECHOCARDIOGRAM (TEE);  Surgeon: [name], MD;  Location: [facility] ENDOSCOPY;  Service: Cardiovascular;  Laterality: N/A;    There were no vitals filed for this visit.      Subjective Assessment - 02/09/17 1403    Subjective Pt reports that things are going better. He was able to complet his exercises for a couple of days, but has not been doing them the past several days.    Patient is accompained by: Family member  wife   Pertinent History chronic neck pain   Limitations Other (comment);Walking;Standing  looking   Patient Stated Goals going up and down steps, increased endurance, more steadiness   Currently in  Pain? No/denies                   [facility] Adult PT Treatment/Exercise - 02/09/17 0001      Knee/Hip Exercises: Aerobic   Nustep L3 x45min, L4 x2 min  gradually increased resistance to progress endurance     Knee/Hip Exercises: Standing   Heel Raises 2 sets;5 reps;Both   Heel Raises Limitations single leg    Hip Extension Both;2 sets;10 reps   Extension Limitations cues to improve posture    Forward Step Up Both;1 set;10 reps;Hand Hold: 2;Step Height: 6"   Forward Step Up Limitations contralateral knee drive      Knee/Hip Exercises: Seated   Long Arc Quad 2 sets;15 reps;Both   Long Arc Quad Weight 6 lbs.   Clamshell with TheraBand Blue  single leg 2x15 reps each    Other Seated Knee/Hip Exercises Seated low rows 2x15 reps with green TB    Hamstring Curl 2 sets;10 reps   Hamstring Limitations double green TB    Sit to Sand 3 sets;5 reps;without UE support  cuing to decrease LE press into table                 PT Education - 02/09/17 1411    Education provided Yes   Education Details technique with therex; encouraged increase in HEP adherence to atleast 4 days/week  Person(s) Educated Patient;Spouse   Methods Explanation;Verbal cues   Comprehension Verbalized understanding          PT Short Term Goals - 02/09/17 1412      PT SHORT TERM GOAL #1   Title increase Berg Balance assessment to 46/56   Time 4   Period Weeks   Status On-going     PT SHORT TERM GOAL #2   Title independent with initial HEP   Time 4   Period Weeks   Status Achieved     PT SHORT TERM GOAL #3   Title 5 x sit to stand < or = to 22 seconds for reduced risk of falls   Baseline 14 sec, no UE   Time 4   Period Weeks   Status Achieved     PT SHORT TERM GOAL #4   Title TUG < or = to 14 sec   Baseline 11 sec, UE support on chair sit<>stand   Time 4   Period Weeks   Status Achieved           PT Long Term Goals - 02/09/17 1416      PT LONG TERM GOAL #1   Title  independent with advanced HEP   Time 8   Period Weeks   Status On-going     PT LONG TERM GOAL #2   Title 5 x sit to stand < or = to 20 sec without using UE for significant reduction in fall risk   Time 8   Period Weeks   Status Achieved     PT LONG TERM GOAL #3   Title reports experiencing 50% less neck and knee pain due to improved postural stability and LE strength   Time 8   Period Weeks   Status On-going     PT LONG TERM GOAL #4   Title improved TUG to 13 sec or less without cane for reduced fall risk   Time 8   Period Weeks   Status Achieved               Plan - 02/09/17 1443    Clinical Impression Statement Pt continues to make progress towards goals, meeting both of his functional strength/balance goals with the TUG and 5x sit to stand this session. His sit to stand technique is improving, evident by his ability to complete without UE support and assistance. Continued this session with strengthening activity, noting fatigue as the session progressed with more prominent knee valgus and Trendelenburg deviations. Pt has been partially compliant with his HEP, so therapist discussed the importance of increasing his adherence. Will continue to encourage this moving forward.    Clinical Impairments Affecting Rehab Potential chronic neck pain   PT Treatment/Interventions ADLs/Self Care Home Management;Dry needling;Taping;Passive range of motion;Manual techniques;Patient/family education;Neuromuscular re-education;Balance training;Therapeutic exercise;Therapeutic activities;Stair training;Gait training;Ultrasound;Moist Heat;Electrical Stimulation;Cryotherapy;Traction;Biofeedback   PT Next Visit Plan progression of posture, progress LE strength, gait, balance   PT Home Exercise Plan sit to stand, standing hip extension, B heel raises, sidestepping with green TB around knees    Consulted and Agree with Plan of Care Patient      Patient will benefit from skilled therapeutic  intervention in order to improve the following deficits and impairments:  Abnormal gait, Decreased balance, Decreased coordination, Decreased endurance, Difficulty walking, Decreased strength, Postural dysfunction, Pain  Visit Diagnosis: Muscle weakness (generalized)  Difficulty in walking, not elsewhere classified     Problem List Patient Active Problem List   Diagnosis Date Noted  .  Osteoarthritis, multiple sites 07/08/2016  . Risk for falls 07/08/2016  . Premature atrial contractions 05/11/2015  . Sepsis ([place]) 04/29/2015  . History of small bowel obstruction 04/29/2015  . History of traumatic brain injury 04/29/2015  . Acute respiratory failure ([place]) 04/29/2015  . CAP (community acquired pneumonia)   . Unspecified cerebral artery occlusion with cerebral infarction 08/25/2012  . Aphasia 08/25/2012  . Macular degeneration 04/25/2012  . CVA (cerebral infarction) 03/20/2012  . Small bowel obstruction ([place]) 09/12/2011  . Nausea & vomiting 09/12/2011  . Memory loss 04/01/2010  . 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 SKIN LESION 10/24/2009  . NEOPLASM, SKIN, UNCERTAIN BEHAVIOR 01/30/5746  . DYSGEUSIA 10/10/2009  . Hyperlipidemia 07/20/2008  . DEPRESSION 07/20/2008  . Essential hypertension 07/20/2008  . RHINITIS 07/20/2008  . COLONIC POLYPS, HX OF 07/20/2008  . GERD 07/18/2008  . PROSTATE CANCER, HX OF 07/18/2008    2:50 PM,02/09/17 Elly Modena PT, DPT [place] [address], [place], [place], [address] Phone: [phone]   Fax:  [phone]  Name: Eric George MRN: [id number] Date of Birth: 1931/01/23

## 2017-02-12 ENCOUNTER — Ambulatory Visit: Payer: Medicare Other | Admitting: Physical Therapy

## 2017-02-12 DIAGNOSIS — M6281 Muscle weakness (generalized): Secondary | ICD-10-CM

## 2017-02-12 DIAGNOSIS — R262 Difficulty in walking, not elsewhere classified: Secondary | ICD-10-CM

## 2017-02-12 NOTE — Therapy (Signed)
[facility] [address] [place], [place], [address] Phone: [phone]   Fax:  [phone]  Physical Therapy Treatment  Patient Details  Name: Eric George MRN: [id number] Date of Birth: 09/03/1930 Referring Provider: Eulas Post  Encounter Date: 02/12/2017      PT End of Session - 02/15/17 1314    Visit Number 8   Number of Visits 10   Date for PT Re-Evaluation 02/18/17   PT Start Time 1100   PT Stop Time 1950   PT Time Calculation (min) 43 min   Activity Tolerance Patient tolerated treatment well;No increased pain   Behavior During Therapy WFL for tasks assessed/performed      Past Medical History:  Diagnosis Date  . Bowel obstruction ([place])   . CAP (community acquired pneumonia)   . 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 GERD (gastroesophageal reflux disease)   . Hydrocephalus   . Hyperlipidemia   . Hypertension   . Osteoarthritis, multiple sites 07/08/2016  . 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 Stroke ([place])   . TBI (traumatic brain injury) [facility])     Past Surgical History:  Procedure Laterality Date  . BRAIN SURGERY    . HERNIA REPAIR    . PROSTATECTOMY    . TEE WITHOUT CARDIOVERSION  03/22/2012   Procedure: TRANSESOPHAGEAL ECHOCARDIOGRAM (TEE);  Surgeon: [name], MD;  Location: [facility] ENDOSCOPY;  Service: Cardiovascular;  Laterality: N/A;    There were no vitals filed for this visit.      Subjective Assessment - 02/15/17 1313    Subjective Pt reports that he did not really work on his exercises since his last session. His wife reports that he had atleast one episode of missing the step at a friends house which resulted in him sitting down on the ground.    Patient is accompained by: Family member  wife   Pertinent History chronic neck pain   Limitations Other (comment);Walking;Standing  looking   Patient Stated Goals going up  and down steps, increased endurance, more steadiness   Currently in Pain? No/denies                         [facility] Adult PT Treatment/Exercise - 02/15/17 0001      Ambulation/Gait   Pre-Gait Activities Pt ambulating and stepping over various size hurdles x5 trials, therapist provdiing CGA to MinA to prevent LOB; Pt ascending and descending steps with 1 handrail x2 trials and without handrails x1 trial with therapist providing CGA, noting poor eccentric control.      Knee/Hip Exercises: Aerobic   Nustep L3 x7min, L4 x2 min  gradually increased resistance to progress endurance     Knee/Hip Exercises: Standing   Heel Raises 2 sets;5 reps;Both   Heel Raises Limitations single leg    Hip Extension    Extension Limitations    Forward Step Up    Forward Step Up Limitations      Knee/Hip Exercises: Seated   Long Arc Quad 2 sets;15 reps;Both   Long Arc Quad Weight 6 lbs.   Clamshell with TheraBand Blue  single leg 2x15 reps each    Other Seated Knee/Hip Exercises --   Hamstring Curl 2 sets;10 reps   Hamstring Limitations double green TB    Sit to Sand --  PT Education - 02/15/17 1313    Education provided Yes   Education Details importance of  completing HEP and having self motivation to improve strength and mobility; encouraged pt to increase self awareness of surroundings such as steps, etc. and utilizing his [facility]   Person(s) Educated Patient;Spouse   Methods Explanation   Comprehension Verbalized understanding          PT Short Term Goals - 02/09/17 1412      PT SHORT TERM GOAL #1   Title increase Berg Balance assessment to 46/56   Time 4   Period Weeks   Status On-going     PT SHORT TERM GOAL #2   Title independent with initial HEP   Time 4   Period Weeks   Status Achieved     PT SHORT TERM GOAL #3   Title 5 x sit to stand < or = to 22 seconds for reduced risk of falls   Baseline 14 sec, no UE   Time 4   Period Weeks    Status Achieved     PT SHORT TERM GOAL #4   Title TUG < or = to 14 sec   Baseline 11 sec, UE support on chair sit<>stand   Time 4   Period Weeks   Status Achieved           PT Long Term Goals - 02/09/17 1416      PT LONG TERM GOAL #1   Title independent with advanced HEP   Time 8   Period Weeks   Status On-going     PT LONG TERM GOAL #2   Title 5 x sit to stand < or = to 20 sec without using UE for significant reduction in fall risk   Time 8   Period Weeks   Status Achieved     PT LONG TERM GOAL #3   Title reports experiencing 50% less neck and knee pain due to improved postural stability and LE strength   Time 8   Period Weeks   Status On-going     PT LONG TERM GOAL #4   Title improved TUG to 13 sec or less without cane for reduced fall risk   Time 8   Period Weeks   Status Achieved               Plan - 02/15/17 1315    Clinical Impression Statement Pt and his caregiver arrive reporting continued issues with negotiating curbs and stairs. Therapist had pt complete stair and hurdle negotiation during today's session and he demonstrated lack of focus on the task at hand, resulting in intermittent poor foot placement and near LOB. Therapist encouraged pt to complete his HEP as this will increase his strength and safety with daily activity. Otherwise, continued with LE strengthening therex to promote improved steadiness with daily activity.    Clinical Impairments Affecting Rehab Potential chronic neck pain   PT Treatment/Interventions ADLs/Self Care Home Management;Dry needling;Taping;Passive range of motion;Manual techniques;Patient/family education;Neuromuscular re-education;Balance training;Therapeutic exercise;Therapeutic activities;Stair training;Gait training;Ultrasound;Moist Heat;Electrical Stimulation;Cryotherapy;Traction;Biofeedback   PT Next Visit Plan re-evaluation; progression of posture, progress LE strength, gait, balance   PT Home Exercise Plan sit to  stand, standing hip extension, B heel raises, sidestepping with green TB around knees    Consulted and Agree with Plan of Care Patient;Family member/caregiver      Patient will benefit from skilled therapeutic intervention in order to improve the following deficits and impairments:  Abnormal gait, Decreased balance, Decreased coordination, Decreased  endurance, Difficulty walking, Decreased strength, Postural dysfunction, Pain  Visit Diagnosis: Muscle weakness (generalized)  Difficulty in walking, not elsewhere classified     Problem List Patient Active Problem List   Diagnosis Date Noted  . Osteoarthritis, multiple sites 07/08/2016  . Risk for falls 07/08/2016  . Premature atrial contractions 05/11/2015  . Sepsis ([place]) 04/29/2015  . History of small bowel obstruction 04/29/2015  . History of traumatic brain injury 04/29/2015  . Acute respiratory failure ([place]) 04/29/2015  . CAP (community acquired pneumonia)   . Unspecified cerebral artery occlusion with cerebral infarction 08/25/2012  . Aphasia 08/25/2012  . Macular degeneration 04/25/2012  . CVA (cerebral infarction) 03/20/2012  . Small bowel obstruction ([place]) 09/12/2011  . Nausea & vomiting 09/12/2011  . Memory loss 04/01/2010  . 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 SKIN LESION 10/24/2009  . NEOPLASM, SKIN, UNCERTAIN BEHAVIOR 68/07/2120  . DYSGEUSIA 10/10/2009  . Hyperlipidemia 07/20/2008  . DEPRESSION 07/20/2008  . Essential hypertension 07/20/2008  . RHINITIS 07/20/2008  . COLONIC POLYPS, HX OF 07/20/2008  . GERD 07/18/2008  . PROSTATE CANCER, HX OF 07/18/2008    1:16 PM,02/15/17 Elly Modena PT, DPT [place] [facility] [place] [facility] [address] [place], [place], [address] Phone: [phone]   Fax:  [phone]  Name: Eric George MRN: [id number] Date of Birth: 1930-08-24

## 2017-02-16 ENCOUNTER — Ambulatory Visit: Payer: Medicare Other | Admitting: Physical Therapy

## 2017-02-16 DIAGNOSIS — R262 Difficulty in walking, not elsewhere classified: Secondary | ICD-10-CM

## 2017-02-16 DIAGNOSIS — M6281 Muscle weakness (generalized): Secondary | ICD-10-CM

## 2017-02-16 NOTE — Therapy (Signed)
[facility] [address], [place], [place], [address] Phone: [phone]   Fax:  [phone]  Physical Therapy Treatment  Patient Details  Name: Eric George MRN: [id number] Date of Birth: 1931/03/14 Referring Provider: Eulas Post  Encounter Date: 02/16/2017      PT End of Session - 02/16/17 1400    Visit Number 9   Number of Visits 10   Date for PT Re-Evaluation 03/16/17   PT Start Time 1324   PT Stop Time 1438   PT Time Calculation (min) 40 min   Activity Tolerance Patient tolerated treatment well;No increased pain   Behavior During Therapy WFL for tasks assessed/performed      Past Medical History:  Diagnosis Date  . Bowel obstruction ([place])   . CAP (community acquired pneumonia)   . 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 GERD (gastroesophageal reflux disease)   . Hydrocephalus   . Hyperlipidemia   . Hypertension   . Osteoarthritis, multiple sites 07/08/2016  . 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 Stroke ([place])   . TBI (traumatic brain injury) [facility])     Past Surgical History:  Procedure Laterality Date  . BRAIN SURGERY    . HERNIA REPAIR    . PROSTATECTOMY    . TEE WITHOUT CARDIOVERSION  03/22/2012   Procedure: TRANSESOPHAGEAL ECHOCARDIOGRAM (TEE);  Surgeon: [name], MD;  Location: [facility] ENDOSCOPY;  Service: Cardiovascular;  Laterality: N/A;    There were no vitals filed for this visit.      Subjective Assessment - 02/16/17 1403    Subjective Pt denies any pain today.  Pt's wife states he is usually sore after working out.   Patient is accompained by: Family member  wife   Pertinent History chronic neck pain   Limitations Other (comment);Walking;Standing   Patient Stated Goals going up and down steps, increased endurance, more steadiness   Currently in Pain? No/denies                         [facility] Adult PT  Treatment/Exercise - 02/16/17 0001      Knee/Hip Exercises: Aerobic   Nustep L3x44mn, L4 x4 min  gradually increased resistance to progress endurance     Knee/Hip Exercises: Standing   Heel Raises Both;10 reps   Heel Raises Limitations --   Hip Extension Both;2 sets;10 reps   Extension Limitations cues to improve posture    Forward Step Up Both;1 set;10 reps;Hand Hold: 2;Step Height: 6"   Forward Step Up Limitations --   Step Down 5 reps;Step Height: 6";Step Height: 2";1 set     Knee/Hip Exercises: Seated   Long Arc Quad 2 sets;15 reps;Both   Long Arc Quad Weight 6 lbs.   Clamshell with TheraBand Blue  single leg 2x15 reps each    Hamstring Curl 2 sets;10 reps   Hamstring Limitations double green TB                 PT Education - 02/15/17 1313    Education provided Yes   Education Details importance of  completing HEP and having self motivation to improve strength and mobility; encouraged pt to increase self awareness of surroundings such as steps, etc. and [facility]  Person(s) Educated Patient;Spouse   Methods Explanation   Comprehension Verbalized understanding  PT Short Term Goals - 02/16/17 1451      PT SHORT TERM GOAL #1   Title increase Berg Balance assessment to 46/56   Baseline 46 baseline Moderate Fall Risk   Time 4   Period Weeks   Status On-going     PT SHORT TERM GOAL #2   Title independent with initial HEP   Time 4   Period Weeks   Status Achieved     PT SHORT TERM GOAL #3   Title 5 x sit to stand < or = to 22 seconds for reduced risk of falls   Baseline 14 sec, no UE   Time 4   Period Weeks   Status Achieved     PT SHORT TERM GOAL #4   Title TUG < or = to 14 sec   Baseline 11 sec, UE support on chair sit<>stand   Time 4   Period Weeks   Status Achieved           PT Long Term Goals - 02/16/17 1452      PT LONG TERM GOAL #1   Title independent with advanced HEP   Time 8   Period Weeks   Status On-going      PT LONG TERM GOAL #2   Title 5 x sit to stand < or = to 20 sec without using UE for significant reduction in fall risk   Baseline 20 sec   Time 8   Period Weeks   Status Achieved     PT LONG TERM GOAL #3   Title reports experiencing 50% less neck and knee pain due to improved postural stability and LE strength   Baseline no complaints of neck pain recently   Time 8   Period Weeks   Status On-going     PT LONG TERM GOAL #4   Title improved TUG to 13 sec or less without cane for reduced fall risk   Time 8   Period Weeks   Status Achieved     PT LONG TERM GOAL #5   Title Improved Berg score to > or = to 50/56 for reduced risk of falls   Time 8   Period Weeks   Status On-going               Plan - 02/16/17 1435    Clinical Impression Statement Patient reported feeling nauseous for a minute during treatment, but eased after taking a seated rest break.  Pt performed exercises with cues and close stand by assistance for stairs and stepping up and over obstacles inthe gym.  Patient demonstrates progress with strength and is able to tolerate more difficult exercises and able to tolerate standing longer periods of time.  He has met most of his short term and long term goals at this time.  Pt continues to demonstrate a need for skilled PT in order to work on improved posture and balance in order to achieve goals for reduced risk of falls. PT recommending continued therapy as stated below.   Rehab Potential Good   Clinical Impairments Affecting Rehab Potential chronic neck pain   PT Frequency 2x / week   PT Duration 4 weeks   PT Treatment/Interventions ADLs/Self Care Home Management;Dry needling;Taping;Passive range of motion;Manual techniques;Patient/family education;Neuromuscular re-education;Balance training;Therapeutic exercise;Therapeutic activities;Stair training;Gait training;Ultrasound;Moist Heat;Electrical Stimulation;Cryotherapy;Traction;Biofeedback   PT Next Visit Plan  progression of posture, progress LE strength, gait, balance   Consulted and Agree with Plan of Care Patient;Family member/caregiver        Patient will benefit from skilled therapeutic intervention in order to improve the following deficits and impairments:  Abnormal gait, Decreased balance, Decreased coordination, Decreased endurance, Difficulty walking, Decreased strength, Postural dysfunction, Pain  Visit Diagnosis: Muscle weakness (generalized)  Difficulty in walking, not elsewhere classified       G-Codes - 02/16/17 1514    Functional Assessment Tool Used (Outpatient Only) TUG and clinical assessment   Functional Limitation Mobility: Walking and moving around   Mobility: Walking and Moving Around Current Status (G8978) At least 20 percent but less than 40 percent impaired, limited or restricted   Mobility: Walking and Moving Around Goal Status (G8979) At least 1 percent but less than 20 percent impaired, limited or restricted      Problem List Patient Active Problem List   Diagnosis Date Noted  . Osteoarthritis, multiple sites 07/08/2016  . Risk for falls 07/08/2016  . Premature atrial contractions 05/11/2015  . Sepsis (H[facility]) 04/29/2015  . History of small bowel obstruction 04/29/2015  . History of traumatic brain injury 04/29/2015  . Acute respiratory failure ([facility]) 04/29/2015  . CAP (community acquired pneumonia)   . Unspecified cerebral artery occlusion with cerebral infarction 08/25/2012  . Aphasia 08/25/2012  . Macular degeneration 04/25/2012  . CVA (cerebral infarction) 03/20/2012  . Small bowel obstruction (H[facility]) 09/12/2011  . Nausea & vomiting 09/12/2011  . Memory loss 04/01/2010  . 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 SKIN LESION 10/24/2009  . NEOPLASM, SKIN, UNCERTAIN BEHAVIOR 10/10/2009  . DYSGEUSIA 10/10/2009  . Hyperlipidemia 07/20/2008  . DEPRESSION 07/20/2008  . Essential hypertension 07/20/2008  . RHINITIS 07/20/2008  . COLONIC POLYPS, HX OF  07/20/2008  . GERD 07/18/2008  . PROSTATE CANCER, HX OF 07/18/2008    Jakki Crosser, PT 02/16/2017, 3:15 PM   [facility] [address], [address] [place], [place], [address] Phone: [phone]   Fax:  [phone]  Name: Eric George MRN: [id number] Date of Birth: 05/22/1930   

## 2017-02-19 ENCOUNTER — Encounter: Payer: Medicare Other | Admitting: Physical Therapy

## 2017-02-23 ENCOUNTER — Ambulatory Visit: Payer: Medicare Other | Admitting: Physical Therapy

## 2017-02-23 DIAGNOSIS — R262 Difficulty in walking, not elsewhere classified: Secondary | ICD-10-CM | POA: Diagnosis not present

## 2017-02-23 DIAGNOSIS — M6281 Muscle weakness (generalized): Secondary | ICD-10-CM

## 2017-02-23 NOTE — Therapy (Signed)
[facility] [address] [place], [place], [address] Phone: [phone]   Fax:  [phone]  Physical Therapy Treatment  Patient Details  Name: Eric George MRN: [id number] Date of Birth: 01-19-1931 Referring Provider: Eulas Post  Encounter Date: 02/23/2017      PT End of Session - 02/23/17 1426    Visit Number 10   Number of Visits 20   Date for PT Re-Evaluation 2017/03/20   Authorization Type G-codes updated 9th visit    PT Start Time 1401   PT Stop Time 1440   PT Time Calculation (min) 39 min   Activity Tolerance Patient tolerated treatment well;No increased pain   Behavior During Therapy WFL for tasks assessed/performed      Past Medical History:  Diagnosis Date  . Bowel obstruction ([place])   . CAP (community acquired pneumonia)   . 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 GERD (gastroesophageal reflux disease)   . Hydrocephalus   . Hyperlipidemia   . Hypertension   . Osteoarthritis, multiple sites 07/08/2016  . 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 Stroke ([place])   . TBI (traumatic brain injury) [facility])     Past Surgical History:  Procedure Laterality Date  . BRAIN SURGERY    . HERNIA REPAIR    . PROSTATECTOMY    . TEE WITHOUT CARDIOVERSION  03/22/2012   Procedure: TRANSESOPHAGEAL ECHOCARDIOGRAM (TEE);  Surgeon: [name], MD;  Location: [facility] ENDOSCOPY;  Service: Cardiovascular;  Laterality: N/A;    There were no vitals filed for this visit.      Subjective Assessment - 02/23/17 1403    Subjective Pt's wife reports that he has been a little more compliant with his HEP and is sore afterwards. No other complaints at this time.    Patient is accompained by: Family member  wife   Pertinent History chronic neck pain   Limitations Other (comment);Walking;Standing   Patient Stated Goals going up and down steps, increased endurance, more  steadiness   Currently in Pain? No/denies                         [facility] Adult PT Treatment/Exercise - 02/23/17 0001      Knee/Hip Exercises: Stretches   Passive Hamstring Stretch 3 reps;Both;30 seconds   Passive Hamstring Stretch Limitations supine with strap    Piriformis Stretch 2 reps;30 seconds;Both   Piriformis Stretch Limitations supine figure 4     Knee/Hip Exercises: Seated   Long Arc Quad 2 sets;10 reps;Both  7.5   Marching Limitations x15 reps each, 7.5#    Hamstring Curl 2 sets;15 reps   Hamstring Limitations double blue TB     Knee/Hip Exercises: Supine   Bridges Limitations 2x10 reps, ankle DF    Straight Leg Raises Both;2 sets;10 reps   Straight Leg Raises Limitations slight ER    Other Supine Knee/Hip Exercises ankle PF with green TB x15 reps each LE     Knee/Hip Exercises: Sidelying   Hip ABduction Both;10 reps;1 set                PT Education - 02/23/17 1423    Education provided Yes   Education Details technique with therex    Person(s) Educated Patient   Methods Explanation;Tactile cues;Verbal cues   Comprehension Verbalized understanding;Returned demonstration  PT Short Term Goals - 02/16/17 1451      PT SHORT TERM GOAL #1   Title increase Berg Balance assessment to 46/56   Baseline 46 baseline Moderate Fall Risk   Time 4   Period Weeks   Status On-going     PT SHORT TERM GOAL #2   Title independent with initial HEP   Time 4   Period Weeks   Status Achieved     PT SHORT TERM GOAL #3   Title 5 x sit to stand < or = to 22 seconds for reduced risk of falls   Baseline 14 sec, no UE   Time 4   Period Weeks   Status Achieved     PT SHORT TERM GOAL #4   Title TUG < or = to 14 sec   Baseline 11 sec, UE support on chair sit<>stand   Time 4   Period Weeks   Status Achieved           PT Long Term Goals - 02/16/17 1452      PT LONG TERM GOAL #1   Title independent with advanced HEP   Time 8    Period Weeks   Status On-going     PT LONG TERM GOAL #2   Title 5 x sit to stand < or = to 20 sec without using UE for significant reduction in fall risk   Baseline 20 sec   Time 8   Period Weeks   Status Achieved     PT LONG TERM GOAL #3   Title reports experiencing 50% less neck and knee pain due to improved postural stability and LE strength   Baseline no complaints of neck pain recently   Time 8   Period Weeks   Status On-going     PT LONG TERM GOAL #4   Title improved TUG to 13 sec or less without cane for reduced fall risk   Time 8   Period Weeks   Status Achieved     PT LONG TERM GOAL #5   Title Improved Berg score to > or = to 50/56 for reduced risk of falls   Time 8   Period Weeks   Status On-going               Plan - 02/23/17 1441    Clinical Impression Statement Pt arrived reporting increase in HEP adherence over the past week or so. Continued today with therex to further address LE endurance and strength deficits. Also incorporated stretching activities to improve posture. He was able to complete today's exercises without report of Lt knee pain, which was an improvement from previous sessions, and several exercises were completed on the mat table to decrease excessive strain with weight bearing. Therapist encouraged pt to continue with HEP adherence and he verbalized understanding. Will continue with current POC.    Rehab Potential Good   Clinical Impairments Affecting Rehab Potential chronic neck pain   PT Frequency 2x / week   PT Duration 4 weeks   PT Treatment/Interventions ADLs/Self Care Home Management;Dry needling;Taping;Passive range of motion;Manual techniques;Patient/family education;Neuromuscular re-education;Balance training;Therapeutic exercise;Therapeutic activities;Stair training;Gait training;Ultrasound;Moist Heat;Electrical Stimulation;Cryotherapy;Traction;Biofeedback   PT Next Visit Plan progression of posture, progress LE strength, gait,  balance   PT Home Exercise Plan sit to stand, standing hip extension, B heel raises, sidestepping with green TB around knees    Consulted and Agree with Plan of Care Patient;Family member/caregiver      Patient will benefit from skilled  therapeutic intervention in order to improve the following deficits and impairments:  Abnormal gait, Decreased balance, Decreased coordination, Decreased endurance, Difficulty walking, Decreased strength, Postural dysfunction, Pain  Visit Diagnosis: Muscle weakness (generalized)  Difficulty in walking, not elsewhere classified     Problem List Patient Active Problem List   Diagnosis Date Noted  . Osteoarthritis, multiple sites 07/08/2016  . Risk for falls 07/08/2016  . Premature atrial contractions 05/11/2015  . Sepsis ([place]) 04/29/2015  . History of small bowel obstruction 04/29/2015  . History of traumatic brain injury 04/29/2015  . Acute respiratory failure ([place]) 04/29/2015  . CAP (community acquired pneumonia)   . Unspecified cerebral artery occlusion with cerebral infarction 08/25/2012  . Aphasia 08/25/2012  . Macular degeneration 04/25/2012  . CVA (cerebral infarction) 03/20/2012  . Small bowel obstruction ([place]) 09/12/2011  . Nausea & vomiting 09/12/2011  . Memory loss 04/01/2010  . 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 SKIN LESION 10/24/2009  . NEOPLASM, SKIN, UNCERTAIN BEHAVIOR 52/77/8242  . DYSGEUSIA 10/10/2009  . Hyperlipidemia 07/20/2008  . DEPRESSION 07/20/2008  . Essential hypertension 07/20/2008  . RHINITIS 07/20/2008  . COLONIC POLYPS, HX OF 07/20/2008  . GERD 07/18/2008  . PROSTATE CANCER, HX OF 07/18/2008   2:45 PM,02/23/17 Elly Modena PT, DPT [place] [facility] [address], [place], [place], [address] Phone: [phone]   Fax:  [phone]  Name: Eric George MRN: [id number] Date of  Birth: 1931/04/23

## 2017-02-26 ENCOUNTER — Ambulatory Visit: Payer: Medicare Other | Admitting: Physical Therapy

## 2017-02-26 DIAGNOSIS — R262 Difficulty in walking, not elsewhere classified: Secondary | ICD-10-CM

## 2017-02-26 DIAGNOSIS — M6281 Muscle weakness (generalized): Secondary | ICD-10-CM | POA: Diagnosis not present

## 2017-02-26 NOTE — Therapy (Signed)
[facility] [address] [place], [place], [address] Phone: [phone]   Fax:  [phone]  Physical Therapy Treatment  Patient Details  Name: Eric George MRN: [id number] Date of Birth: [birth year] Referring Provider: Eulas Post  Encounter Date: 02/26/2017      PT End of Session - 02/26/17 1144    Visit Number 11   Number of Visits 20   Date for PT Re-Evaluation 01-Apr-2017   Authorization Type G-codes updated 9th visit    PT Start Time 1105   PT Stop Time 1145   PT Time Calculation (min) 40 min   Activity Tolerance Patient tolerated treatment well;No increased pain   Behavior During Therapy WFL for tasks assessed/performed      Past Medical History:  Diagnosis Date  . Bowel obstruction ([place])   . CAP (community acquired pneumonia)   . 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 GERD (gastroesophageal reflux disease)   . Hydrocephalus   . Hyperlipidemia   . Hypertension   . Osteoarthritis, multiple sites 07/08/2016  . 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 Stroke ([place])   . TBI (traumatic brain injury) [facility])     Past Surgical History:  Procedure Laterality Date  . BRAIN SURGERY    . HERNIA REPAIR    . PROSTATECTOMY    . TEE WITHOUT CARDIOVERSION  03/22/2012   Procedure: TRANSESOPHAGEAL ECHOCARDIOGRAM (TEE);  Surgeon: [name], MD;  Location: [facility] ENDOSCOPY;  Service: Cardiovascular;  Laterality: N/A;    There were no vitals filed for this visit.      Subjective Assessment - 02/26/17 1107    Subjective Pt reports that things are going well. He has some Lt knee soreness and stiffness at the moment. He has been completing his HEP.    Patient is accompained by: Family member  wife   Pertinent History chronic neck pain   Limitations Other (comment);Walking;Standing   Patient Stated Goals going up and down steps, increased endurance, more  steadiness   Currently in Pain? Yes   Pain Score 2    Pain Location Knee   Pain Orientation Left;Lateral   Pain Descriptors / Indicators Dull;Aching   Pain Type Chronic pain   Pain Radiating Towards none    Pain Onset More than a month ago   Pain Frequency Intermittent   Aggravating Factors  unsure    Pain Relieving Factors resting                          [facility] Adult PT Treatment/Exercise - 02/26/17 0001      Knee/Hip Exercises: Aerobic   Nustep L2 increased to L5 over 8 min to promote endurance, therapist present to adjust resistance and encourage pt to remain above 80 SPM      Knee/Hip Exercises: Supine   Straight Leg Raises Both;2 sets;10 reps   Other Supine Knee/Hip Exercises ankle PF with green TB 2x15 reps each LE     Knee/Hip Exercises: Sidelying   Hip ABduction Both;1 set;10 reps             Balance Exercises - 02/26/17 1132      Balance Exercises: Standing   Standing Eyes Opened Narrow base of support (BOS);Foam/compliant surface;2 reps;30 secs   Standing Eyes Closed Narrow base of support (BOS);Solid surface;2 reps;30 secs;Other (comment)  posterior lean intermittently    Tandem  Stance --  Lt: 18 sec, Rt: 25 sec   Standing, One Foot on a Step Eyes open;6 inch;1 rep;30 secs  increased difficulty on Rt    Other Standing Exercises Single leg stance with contralateral LE on step, lift off the box x10 reps each            PT Education - 02/26/17 1152    Education provided Yes   Education Details technique with therex   Person(s) Educated Patient   Methods Explanation   Comprehension Verbalized understanding          PT Short Term Goals - 02/16/17 1451      PT SHORT TERM GOAL #1   Title increase Berg Balance assessment to 46/56   Baseline 46 baseline Moderate Fall Risk   Time 4   Period Weeks   Status On-going     PT SHORT TERM GOAL #2   Title independent with initial HEP   Time 4   Period Weeks   Status Achieved     PT  SHORT TERM GOAL #3   Title 5 x sit to stand < or = to 22 seconds for reduced risk of falls   Baseline 14 sec, no UE   Time 4   Period Weeks   Status Achieved     PT SHORT TERM GOAL #4   Title TUG < or = to 14 sec   Baseline 11 sec, UE support on chair sit<>stand   Time 4   Period Weeks   Status Achieved           PT Long Term Goals - 02/16/17 1452      PT LONG TERM GOAL #1   Title independent with advanced HEP   Time 8   Period Weeks   Status On-going     PT LONG TERM GOAL #2   Title 5 x sit to stand < or = to 20 sec without using UE for significant reduction in fall risk   Baseline 20 sec   Time 8   Period Weeks   Status Achieved     PT LONG TERM GOAL #3   Title reports experiencing 50% less neck and knee pain due to improved postural stability and LE strength   Baseline no complaints of neck pain recently   Time 8   Period Weeks   Status On-going     PT LONG TERM GOAL #4   Title improved TUG to 13 sec or less without cane for reduced fall risk   Time 8   Period Weeks   Status Achieved     PT LONG TERM GOAL #5   Title Improved Berg score to > or = to 50/56 for reduced risk of falls   Time 8   Period Weeks   Status On-going               Plan - 02/26/17 1148    Clinical Impression Statement Continued this session with exercise and balance activity to improve safety at home. Pt was able to complete therex without increased pain, however he did demonstrate fatigue during static balance activity. Pt required occasional cuing to focus on breathing as well as assist with posterior lean. Will continue with current POC and focus on balance activity to decrease risk of falls.    Rehab Potential Good   Clinical Impairments Affecting Rehab Potential chronic neck pain   PT Frequency 2x / week   PT Duration 4 weeks   PT Treatment/Interventions  ADLs/Self Care Home Management;Dry needling;Taping;Passive range of motion;Manual techniques;Patient/family  education;Neuromuscular re-education;Balance training;Therapeutic exercise;Therapeutic activities;Stair training;Gait training;Ultrasound;Moist Heat;Electrical Stimulation;Cryotherapy;Traction;Biofeedback   PT Next Visit Plan progression of posture, balance activity, progress LE strength, gait, balance   PT Home Exercise Plan sit to stand, standing hip extension, B heel raises, sidestepping with green TB around knees    Consulted and Agree with Plan of Care Patient;Family member/caregiver      Patient will benefit from skilled therapeutic intervention in order to improve the following deficits and impairments:  Abnormal gait, Decreased balance, Decreased coordination, Decreased endurance, Difficulty walking, Decreased strength, Postural dysfunction, Pain  Visit Diagnosis: Muscle weakness (generalized)  Difficulty in walking, not elsewhere classified     Problem List Patient Active Problem List   Diagnosis Date Noted  . Osteoarthritis, multiple sites 07/08/2016  . Risk for falls 07/08/2016  . Premature atrial contractions 05/11/2015  . Sepsis ([place]) 04/29/2015  . History of small bowel obstruction 04/29/2015  . History of traumatic brain injury 04/29/2015  . Acute respiratory failure ([place]) 04/29/2015  . CAP (community acquired pneumonia)   . Unspecified cerebral artery occlusion with cerebral infarction 08/25/2012  . Aphasia 08/25/2012  . Macular degeneration 04/25/2012  . CVA (cerebral infarction) 03/20/2012  . Small bowel obstruction ([place]) 09/12/2011  . Nausea & vomiting 09/12/2011  . Memory loss 04/01/2010  . 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 SKIN LESION 10/24/2009  . NEOPLASM, SKIN, UNCERTAIN BEHAVIOR 98/33/8250  . DYSGEUSIA 10/10/2009  . Hyperlipidemia 07/20/2008  . DEPRESSION 07/20/2008  . Essential hypertension 07/20/2008  . RHINITIS 07/20/2008  . COLONIC POLYPS, HX OF 07/20/2008  . GERD 07/18/2008  . PROSTATE CANCER, HX OF 07/18/2008    11:52  AM,02/26/17 Elly Modena PT, DPT [address] [facility] [address], [place], [place], [address] Phone: [phone]   Fax:  [phone]  Name: TIMOTHY TRUDELL MRN: [id number] Date of Birth: 02-May-1931

## 2017-03-02 ENCOUNTER — Encounter: Payer: Medicare Other | Admitting: Physical Therapy

## 2017-03-03 IMAGING — CT CT ABD-PELV W/ CM
2 of 6 series · 8 of 46 positions shown, 9 images · IV contrast (Iodine)
Comparison: CT of the abdomen and pelvis from 12/22/2013

CLINICAL DATA: Acute onset of left lower quadrant abdominal pain,
nausea and vomiting. Initial encounter.

EXAM:
CT ABDOMEN AND PELVIS WITH CONTRAST
TECHNIQUE: Multidetector CT imaging of the abdomen and pelvis was performed
using the standard protocol following bolus administration of
intravenous contrast.
CONTRAST:  80mL OMNIPAQUE IOHEXOL 300 MG/ML  SOLN

[Series 201: routine, idose (2) · axial · 0.85mm/px · z∈[+40,+430]mm · 5 of 106 slices shown, 6 images]
[im 14/106  soft-tissue]
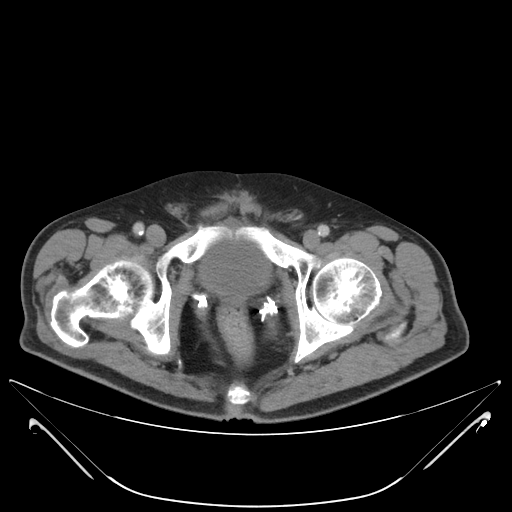
[im 14/106  bone]
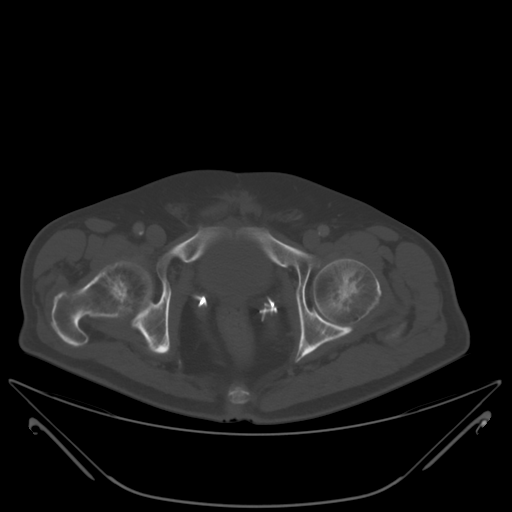
[im 33/106  soft-tissue]
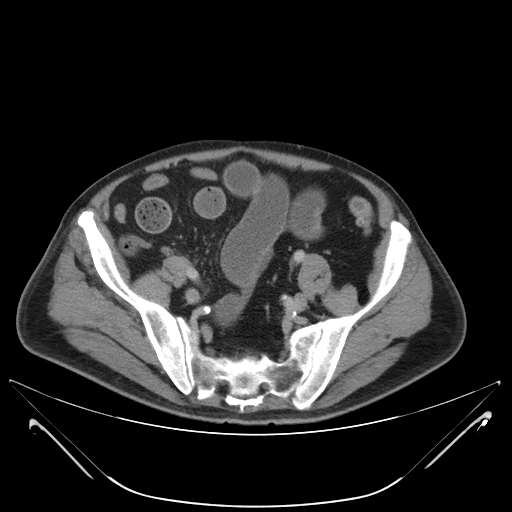
[im 53/106  soft-tissue]
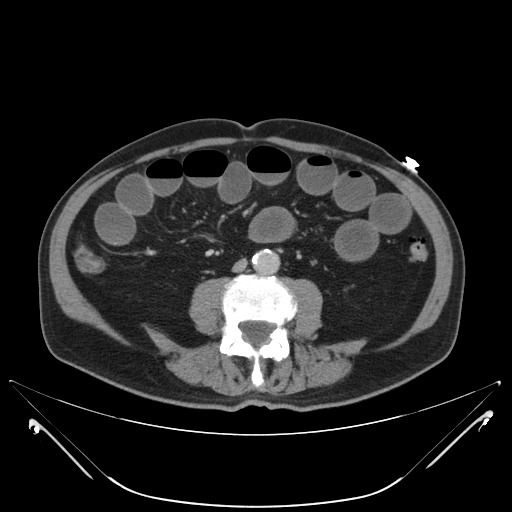
[im 73/106  soft-tissue]
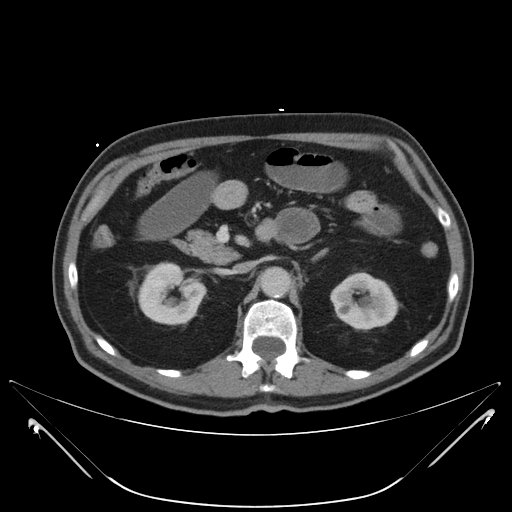
[im 92/106  soft-tissue]
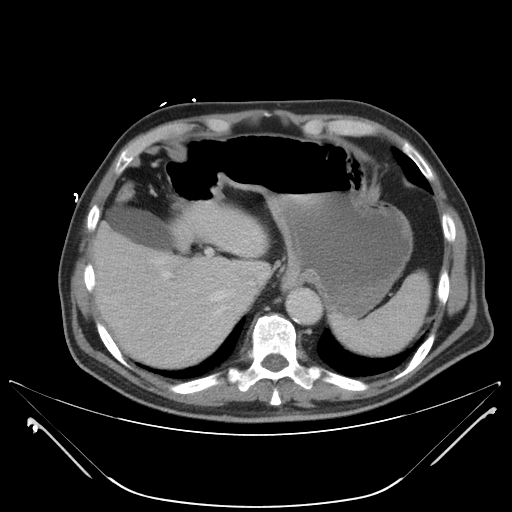

[Series 203: coronals, idose (2) · coronal · 0.45mm/px · 3 of 124 slices shown]
[im 42/124  soft-tissue]
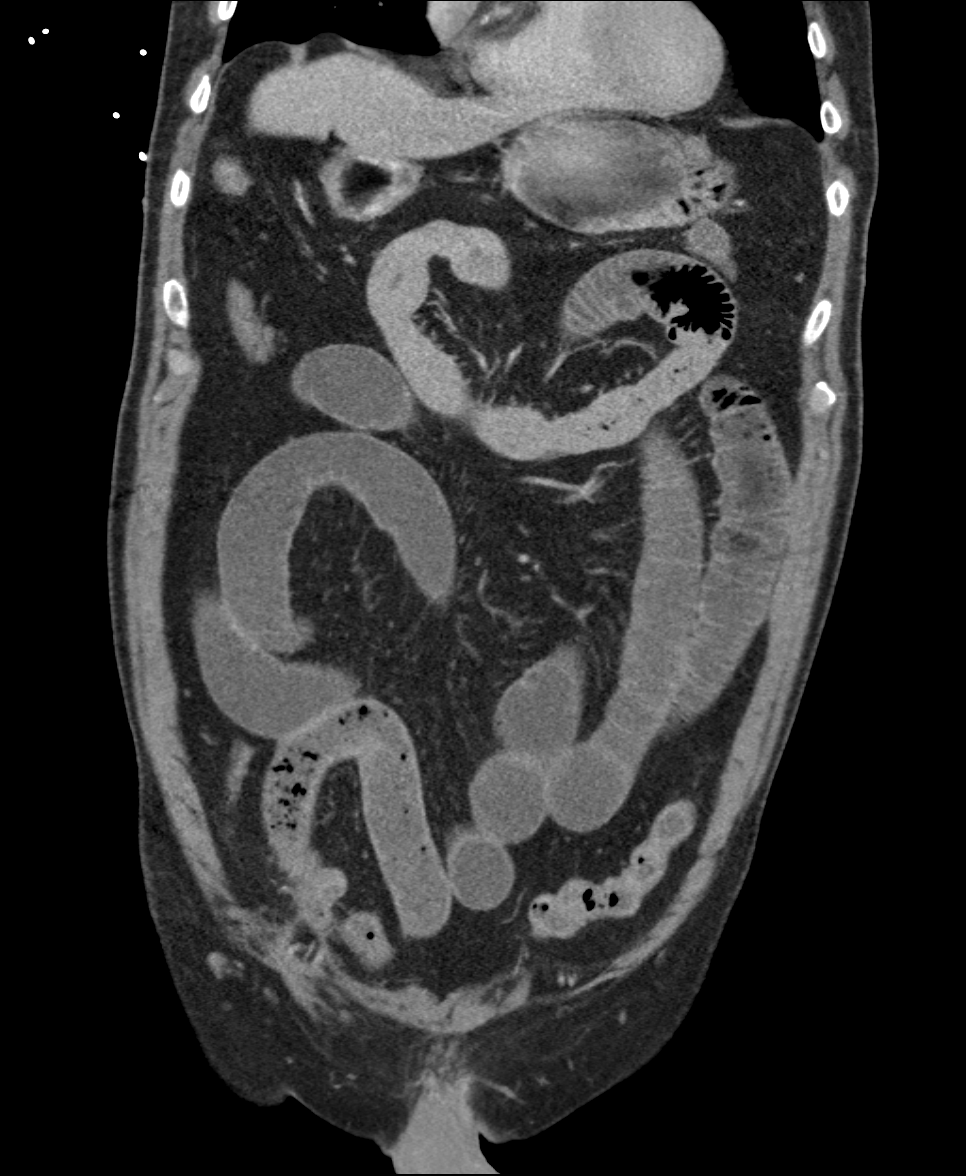
[im 55/124  soft-tissue]
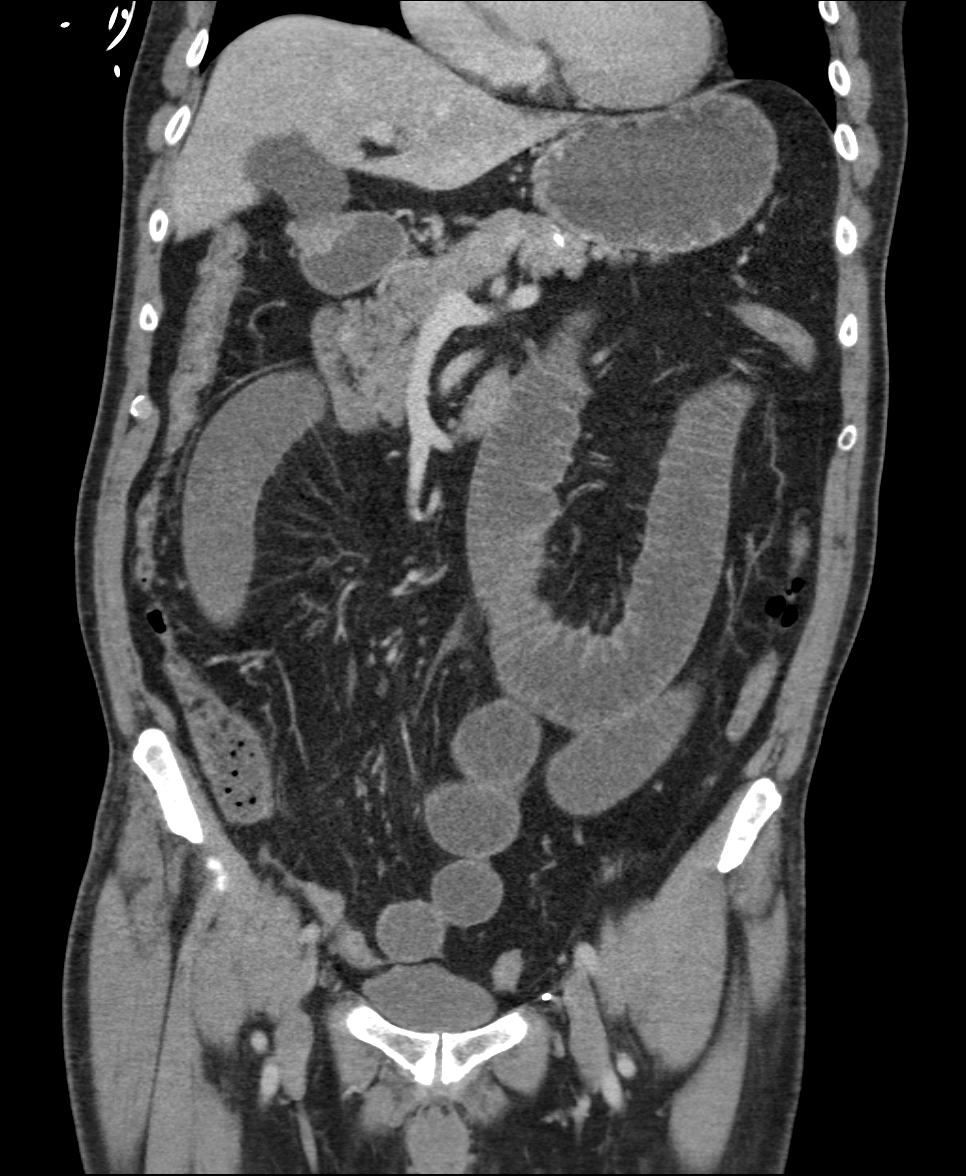
[im 69/124  soft-tissue]
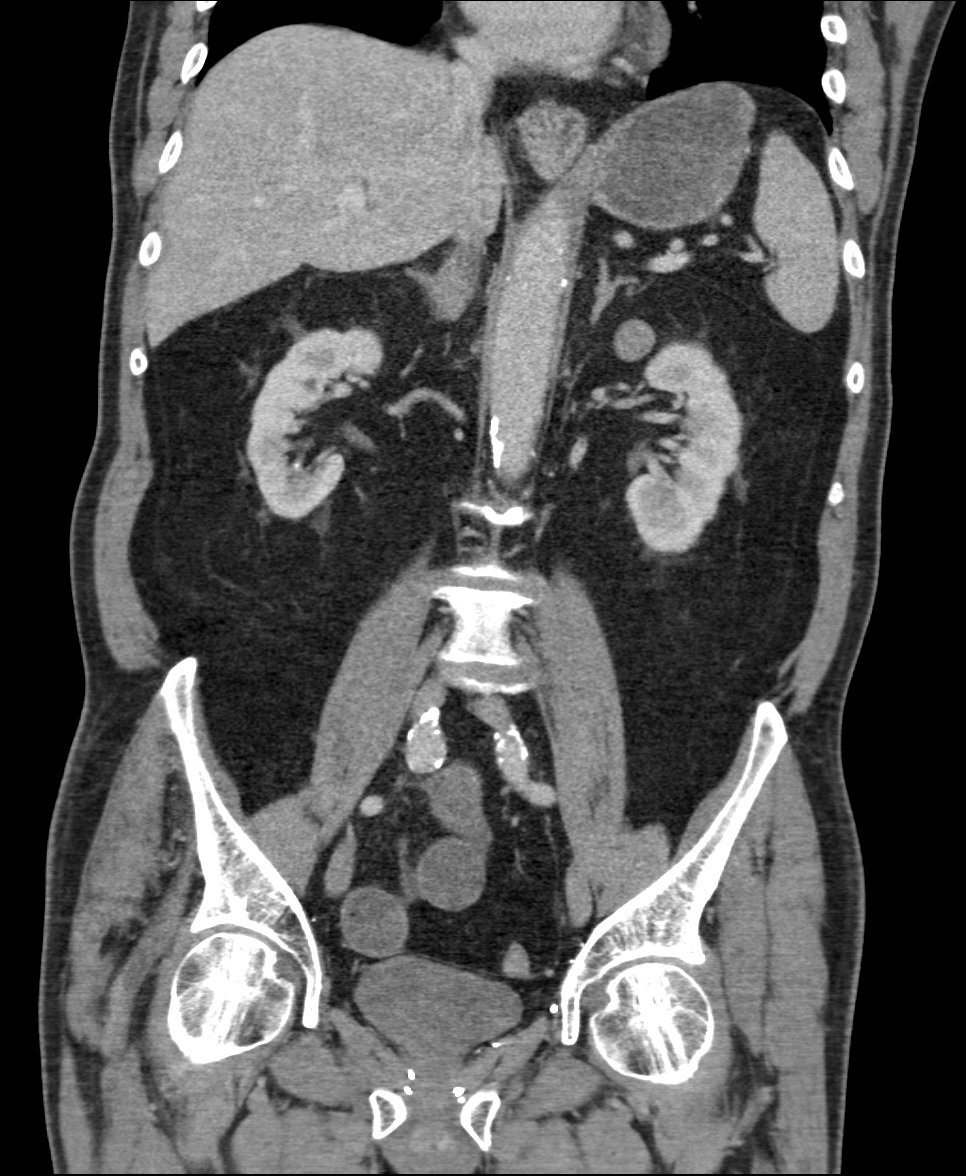

[8 of 46 positions shown; findings below may reference images not displayed]

FINDINGS: Mild bibasilar atelectasis is noted.

A likely 1.0 cm cyst is noted within the hepatic dome. The liver and
spleen are otherwise unremarkable. The gallbladder is within normal
limits. The pancreas and adrenal glands are unremarkable.

The kidneys are unremarkable in appearance. There is no evidence of
hydronephrosis. No renal or ureteral stones are seen. No perinephric
stranding is appreciated.

The small bowel is partially filled with fluid, without evidence of
a focal transition point. Trace surrounding free fluid is seen,
raising question for a mild infectious or inflammatory process. The
stomach is within normal limits. No acute vascular abnormalities are
seen. Mild scattered calcification is noted along the abdominal
aorta and its branches.

The appendix is normal in caliber, without evidence of appendicitis.
The colon is largely decompressed. Scattered diverticulosis is noted
along the distal descending and proximal sigmoid colon, without
evidence of diverticulitis.

The bladder is mildly distended and grossly unremarkable. The
patient is status post prostatectomy. No inguinal lymphadenopathy is
seen.

No acute osseous abnormalities are identified. Facet disease is
noted at the lower lumbar spine.
IMPRESSION: 1. Small bowel partially filled with fluid, without evidence of a
focal transition point. Trace surrounding free fluid noted, raising
question for mild infectious or inflammatory process. No evidence of
bowel obstruction.
2. Likely small hepatic cyst noted.
3. Mild bibasilar atelectasis noted.
4. Scattered diverticulosis along the distal descending and proximal
sigmoid colon, without evidence of diverticulitis.
5. Mild scattered calcification along the abdominal aorta and its
branches.

## 2017-03-03 IMAGING — CR DG CHEST 1V PORT
1 series · 1 of 1 positions shown · non-contrast
Comparison: Chest radiograph performed 03/20/2012

CLINICAL DATA: Acute onset of hypoxia.  Initial encounter.

EXAM:
PORTABLE CHEST 1 VIEW

[AP]
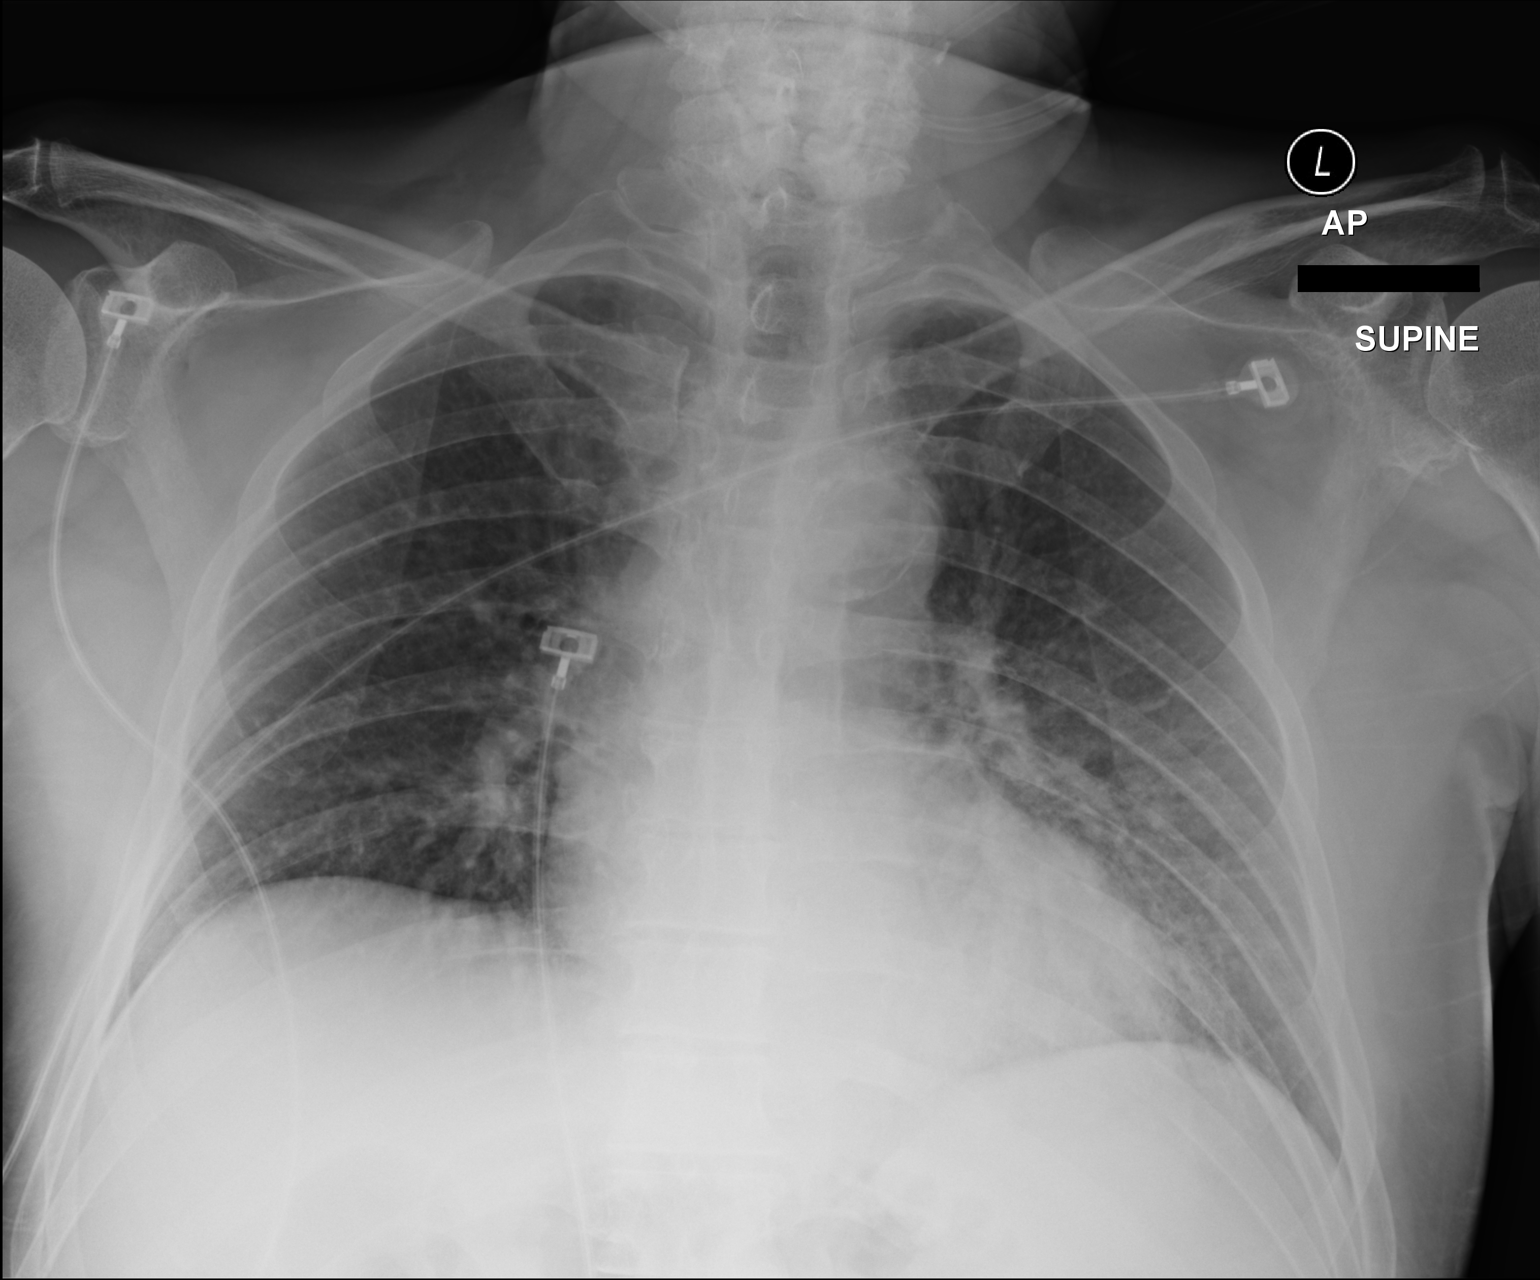

[1 of 1 positions shown; findings below may reference images not displayed]

FINDINGS: The lungs are well-aerated see. Patchy left-sided airspace
opacification is compatible with pneumonia. There is no evidence of
pleural effusion or pneumothorax.

The cardiomediastinal silhouette is within normal limits. No acute
osseous abnormalities are seen.
IMPRESSION: Patchy left-sided pneumonia noted.

## 2017-03-04 ENCOUNTER — Ambulatory Visit: Payer: Medicare Other | Attending: Family Medicine | Admitting: Physical Therapy

## 2017-03-04 ENCOUNTER — Encounter: Payer: Self-pay | Admitting: Physical Therapy

## 2017-03-04 DIAGNOSIS — R262 Difficulty in walking, not elsewhere classified: Secondary | ICD-10-CM | POA: Diagnosis not present

## 2017-03-04 DIAGNOSIS — M6281 Muscle weakness (generalized): Secondary | ICD-10-CM

## 2017-03-04 NOTE — Therapy (Signed)
[facility] [address] [place], [place], [address] Phone: [phone]   Fax:  [phone]  Physical Therapy Treatment  Patient Details  Name: Eric George MRN: [id number] Date of Birth: 1931/03/04 Referring Provider: Eulas Post  Encounter Date: 03/04/2017      PT End of Session - 03/04/17 1451    Visit Number 12   Number of Visits 20   Date for PT Re-Evaluation 03/20/17   Authorization Type G-codes updated 9th visit    PT Start Time 7654   PT Stop Time 6503   PT Time Calculation (min) 42 min   Activity Tolerance Patient tolerated treatment well;No increased pain   Behavior During Therapy WFL for tasks assessed/performed      Past Medical History:  Diagnosis Date  . Bowel obstruction ([place])   . CAP (community acquired pneumonia)   . 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 GERD (gastroesophageal reflux disease)   . Hydrocephalus   . Hyperlipidemia   . Hypertension   . Osteoarthritis, multiple sites 07/08/2016  . 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 Stroke ([place])   . TBI (traumatic brain injury) [facility])     Past Surgical History:  Procedure Laterality Date  . BRAIN SURGERY    . HERNIA REPAIR    . PROSTATECTOMY    . TEE WITHOUT CARDIOVERSION  03/22/2012   Procedure: TRANSESOPHAGEAL ECHOCARDIOGRAM (TEE);  Surgeon: [name], MD;  Location: [facility] ENDOSCOPY;  Service: Cardiovascular;  Laterality: N/A;    There were no vitals filed for this visit.      Subjective Assessment - 03/04/17 1455    Subjective Pt reports no new complaints.  Wife states he has been having diarrhea and doesn't feel good all the time, but has been doing exercises when he can.  States he walks much better outside on the sidewalk than he does inside.   Patient is accompained by: Family member  wife   Pertinent History chronic neck pain   Limitations Other  (comment);Walking;Standing   Patient Stated Goals going up and down steps, increased endurance, more steadiness   Currently in Pain? No/denies                         [facility] Adult PT Treatment/Exercise - 03/04/17 0001      Neuro Re-ed    Neuro Re-ed Details  single leg standing on green pad - 3 x to fatigue each side     Knee/Hip Exercises: Stretches   Active Hamstring Stretch Right;Left;3 reps;10 seconds  sitting in chair   Piriformis Stretch Right;Left;3 reps;20 seconds  sitting in chair     Knee/Hip Exercises: Aerobic   Nustep L2 x 10 min     Knee/Hip Exercises: Standing   Heel Raises Both;20 reps   Knee Flexion Strengthening;Right;Left;10 reps  2lb   Hip Flexion Stengthening;Right;Left;10 reps;Knee bent  2lb   Hip Abduction Stengthening;Right;Left;10 reps;Knee straight  2lb   Hip Extension Stengthening;Right;Left;10 reps;Knee straight  2lb   Forward Step Up Both;Hand Hold: 2;2 sets;10 reps;Step Height: 6"     Knee/Hip Exercises: Supine   Bridges Limitations 2x10 reps, ankle DF    Bridges with Clamshell Strengthening;Both;20 reps  green band, clam without bridge in supine   Straight Leg Raises Both;2 sets;10 reps     Knee/Hip Exercises: Sidelying   Hip ABduction Both;1 set;10 reps  PT Short Term Goals - 02/16/17 1451      PT SHORT TERM GOAL #1   Title increase Berg Balance assessment to 46/56   Baseline 46 baseline Moderate Fall Risk   Time 4   Period Weeks   Status On-going     PT SHORT TERM GOAL #2   Title independent with initial HEP   Time 4   Period Weeks   Status Achieved     PT SHORT TERM GOAL #3   Title 5 x sit to stand < or = to 22 seconds for reduced risk of falls   Baseline 14 sec, no UE   Time 4   Period Weeks   Status Achieved     PT SHORT TERM GOAL #4   Title TUG < or = to 14 sec   Baseline 11 sec, UE support on chair sit<>stand   Time 4   Period Weeks   Status Achieved           PT  Long Term Goals - 02/16/17 1452      PT LONG TERM GOAL #1   Title independent with advanced HEP   Time 8   Period Weeks   Status On-going     PT LONG TERM GOAL #2   Title 5 x sit to stand < or = to 20 sec without using UE for significant reduction in fall risk   Baseline 20 sec   Time 8   Period Weeks   Status Achieved     PT LONG TERM GOAL #3   Title reports experiencing 50% less neck and knee pain due to improved postural stability and LE strength   Baseline no complaints of neck pain recently   Time 8   Period Weeks   Status On-going     PT LONG TERM GOAL #4   Title improved TUG to 13 sec or less without cane for reduced fall risk   Time 8   Period Weeks   Status Achieved     PT LONG TERM GOAL #5   Title Improved Berg score to > or = to 50/56 for reduced risk of falls   Time 8   Period Weeks   Status On-going               Plan - 03/04/17 1529    Clinical Impression Statement Pt did well with exercises today and did not have any difficutly standing up from chair without needing UE support.  He was able to balance with close supervision/CGA on green pad without UE assistance.  After standing on single leg to fatigue he had a little discomfort in knees but it was relieved after walking a few steps.  Pt is doing well and progressed with increaed resistance in standing.  Pt needs skilled PT to continue working on strength for maximum function.   Rehab Potential Good   Clinical Impairments Affecting Rehab Potential chronic neck pain   PT Treatment/Interventions ADLs/Self Care Home Management;Dry needling;Taping;Passive range of motion;Manual techniques;Patient/family education;Neuromuscular re-education;Balance training;Therapeutic exercise;Therapeutic activities;Stair training;Gait training;Ultrasound;Moist Heat;Electrical Stimulation;Cryotherapy;Traction;Biofeedback   PT Next Visit Plan progression of posture, balance activity, progress LE strength, gait, balance    Consulted and Agree with Plan of Care Patient;Family member/caregiver      Patient will benefit from skilled therapeutic intervention in order to improve the following deficits and impairments:  Abnormal gait, Decreased balance, Decreased coordination, Decreased endurance, Difficulty walking, Decreased strength, Postural dysfunction, Pain  Visit Diagnosis: Muscle weakness (generalized)  Difficulty  in walking, not elsewhere classified     Problem List Patient Active Problem List   Diagnosis Date Noted  . Osteoarthritis, multiple sites 07/08/2016  . Risk for falls 07/08/2016  . Premature atrial contractions 05/11/2015  . Sepsis ([place]) 04/29/2015  . History of small bowel obstruction 04/29/2015  . History of traumatic brain injury 04/29/2015  . Acute respiratory failure ([place]) 04/29/2015  . CAP (community acquired pneumonia)   . Unspecified cerebral artery occlusion with cerebral infarction 08/25/2012  . Aphasia 08/25/2012  . Macular degeneration 04/25/2012  . CVA (cerebral infarction) 03/20/2012  . Small bowel obstruction ([place]) 09/12/2011  . Nausea & vomiting 09/12/2011  . Memory loss 04/01/2010  . 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 SKIN LESION 10/24/2009  . NEOPLASM, SKIN, UNCERTAIN BEHAVIOR 28/41/3244  . DYSGEUSIA 10/10/2009  . Hyperlipidemia 07/20/2008  . DEPRESSION 07/20/2008  . Essential hypertension 07/20/2008  . RHINITIS 07/20/2008  . COLONIC POLYPS, HX OF 07/20/2008  . GERD 07/18/2008  . PROSTATE CANCER, HX OF 07/18/2008    Zannie Cove, PT 03/04/2017, 3:35 PM  [facility] [address] [place], [place], [address] Phone: [phone]   Fax:  [phone]  Name: Eric George MRN: [id number] Date of Birth: [birth year]

## 2017-03-08 ENCOUNTER — Encounter: Payer: Self-pay | Admitting: Physical Therapy

## 2017-03-08 ENCOUNTER — Ambulatory Visit: Payer: Medicare Other | Admitting: Physical Therapy

## 2017-03-08 DIAGNOSIS — M6281 Muscle weakness (generalized): Secondary | ICD-10-CM | POA: Diagnosis not present

## 2017-03-08 DIAGNOSIS — R262 Difficulty in walking, not elsewhere classified: Secondary | ICD-10-CM | POA: Diagnosis not present

## 2017-03-08 NOTE — Therapy (Signed)
[facility] [address], [place], [place], [address] Phone: [phone]   Fax:  [phone]  Physical Therapy Treatment  Patient Details  Name: Eric George MRN: [id number] Date of Birth: Mar 15, 1931 Referring Provider: Eulas Post   Encounter Date: 03/08/2017  PT End of Session - 03/08/17 1457    Visit Number  13    Number of Visits  20    Date for PT Re-Evaluation  03/16/17    Authorization Type  G-codes updated 9th visit     PT Start Time  6063    PT Stop Time  1525    PT Time Calculation (min)  40 min    Activity Tolerance  Patient tolerated treatment well;No increased pain    Behavior During Therapy  WFL for tasks assessed/performed       Past Medical History:  Diagnosis Date  . Bowel obstruction ([place])   . CAP (community acquired pneumonia)   . 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 GERD (gastroesophageal reflux disease)   . Hydrocephalus   . Hyperlipidemia   . Hypertension   . Osteoarthritis, multiple sites 07/08/2016  . 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 Stroke ([place])   . TBI (traumatic brain injury) [facility])     Past Surgical History:  Procedure Laterality Date  . BRAIN SURGERY    . HERNIA REPAIR    . PROSTATECTOMY      There were no vitals filed for this visit.  Subjective Assessment - 03/08/17 1456    Subjective  Pt states he has been doing well.  Wife states he did not experience any pain or increased soreness after therapy last session.      Patient is accompained by:  Family member wife   wife   Pertinent History  chronic neck pain    Limitations  Other (comment);Walking;Standing    Patient Stated Goals  going up and down steps, increased endurance, more steadiness    Currently in Pain?  No/denies    Pain Onset  More than a month ago                      [facility] Adult PT Treatment/Exercise - 03/08/17 0001      Neuro  Re-ed    Neuro Re-ed Details   single leg standing on green pad - 3 x to fatigue each side; standing on foam mat during sit to stand and standing exercises      Knee/Hip Exercises: Stretches   Active Hamstring Stretch  Right;Left;3 reps;10 seconds sitting in chair   sitting in chair   Piriformis Stretch  Right;Left;3 reps;20 seconds sitting in chair   sitting in chair     Knee/Hip Exercises: Aerobic   Nustep  L2 x 3 min, L3x 7 min      Knee/Hip Exercises: Standing   Heel Raises  Both;20 reps    Knee Flexion  Strengthening;Right;Left;10 reps 2lb; standing on foam mat   2lb; standing on foam mat   Hip Flexion  Stengthening;Right;Left;10 reps;Knee bent 2lb; standing on foam mat   2lb; standing on foam mat   Hip Abduction  Stengthening;Right;Left;10 reps;Knee straight 2lb; standing on foam mat   2lb; standing on foam mat   Hip Extension  Stengthening;Right;Left;10 reps;Knee straight 2lb; standing on foam mat   2lb; standing on foam mat   Forward Step Up  Both;Hand Hold:  2;2 sets;10 reps;Step Height: 6"      Knee/Hip Exercises: Supine   Straight Leg Raises  Both;2 sets;10 reps               PT Short Term Goals - 02/16/17 1451      PT SHORT TERM GOAL #1   Title  increase Berg Balance assessment to 46/56    Baseline  46 baseline Moderate Fall Risk    Time  4    Period  Weeks    Status  On-going      PT SHORT TERM GOAL #2   Title  independent with initial HEP    Time  4    Period  Weeks    Status  Achieved      PT SHORT TERM GOAL #3   Title  5 x sit to stand < or = to 22 seconds for reduced risk of falls    Baseline  14 sec, no UE    Time  4    Period  Weeks    Status  Achieved      PT SHORT TERM GOAL #4   Title  TUG < or = to 14 sec    Baseline  11 sec, UE support on chair sit<>stand    Time  4    Period  Weeks    Status  Achieved        PT Long Term Goals - 02/16/17 1452      PT LONG TERM GOAL #1   Title  independent with advanced HEP    Time  8     Period  Weeks    Status  On-going      PT LONG TERM GOAL #2   Title  5 x sit to stand < or = to 20 sec without using UE for significant reduction in fall risk    Baseline  20 sec    Time  8    Period  Weeks    Status  Achieved      PT LONG TERM GOAL #3   Title  reports experiencing 50% less neck and knee pain due to improved postural stability and LE strength    Baseline  no complaints of neck pain recently    Time  8    Period  Weeks    Status  On-going      PT LONG TERM GOAL #4   Title  improved TUG to 13 sec or less without cane for reduced fall risk    Time  8    Period  Weeks    Status  Achieved      PT LONG TERM GOAL #5   Title  Improved Berg score to > or = to 50/56 for reduced risk of falls    Time  8    Period  Weeks    Status  On-going            Plan - 03/08/17 1458    Clinical Impression Statement  Patient continues to demonstrate improved strength with increased difficulty and added foam mat to sit to stand and standing exercises.  He demonstrates activity tolerance with only a few seated rest breaks.  He continues to benefit from skilled PT for improved gait and stabilty for reduced risk of falls.    Rehab Potential  Good    Clinical Impairments Affecting Rehab Potential  chronic neck pain    PT Treatment/Interventions  ADLs/Self Care Home Management;Dry needling;Taping;Passive range of motion;Manual  techniques;Patient/family education;Neuromuscular re-education;Balance training;Therapeutic exercise;Therapeutic activities;Stair training;Gait training;Ultrasound;Moist Heat;Electrical Stimulation;Cryotherapy;Traction;Biofeedback    Consulted and Agree with Plan of Care  Patient;Family member/caregiver    Family Member Consulted  wife       Patient will benefit from skilled therapeutic intervention in order to improve the following deficits and impairments:  Abnormal gait, Decreased balance, Decreased coordination, Decreased endurance, Difficulty walking,  Decreased strength, Postural dysfunction, Pain  Visit Diagnosis: Muscle weakness (generalized)  Difficulty in walking, not elsewhere classified     Problem List Patient Active Problem List   Diagnosis Date Noted  . Osteoarthritis, multiple sites 07/08/2016  . Risk for falls 07/08/2016  . Premature atrial contractions 05/11/2015  . Sepsis ([place]) 04/29/2015  . History of small bowel obstruction 04/29/2015  . History of traumatic brain injury 04/29/2015  . Acute respiratory failure ([place]) 04/29/2015  . CAP (community acquired pneumonia)   . Unspecified cerebral artery occlusion with cerebral infarction 08/25/2012  . Aphasia 08/25/2012  . Macular degeneration 04/25/2012  . CVA (cerebral infarction) 03/20/2012  . Small bowel obstruction ([place]) 09/12/2011  . Nausea & vomiting 09/12/2011  . Memory loss 04/01/2010  . 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 SKIN LESION 10/24/2009  . NEOPLASM, SKIN, UNCERTAIN BEHAVIOR 05/06/7251  . DYSGEUSIA 10/10/2009  . Hyperlipidemia 07/20/2008  . DEPRESSION 07/20/2008  . Essential hypertension 07/20/2008  . RHINITIS 07/20/2008  . COLONIC POLYPS, HX OF 07/20/2008  . GERD 07/18/2008  . PROSTATE CANCER, HX OF 07/18/2008    Zannie Cove, PT 03/08/2017, 3:30 PM  [facility] [address] [place], [place], [address] Phone: [phone]   Fax:  [phone]  Name: MAIKOL GRASSIA MRN: [id number] Date of Birth: July 18, 1930

## 2017-03-11 DIAGNOSIS — H903 Sensorineural hearing loss, bilateral: Secondary | ICD-10-CM | POA: Diagnosis not present

## 2017-03-12 ENCOUNTER — Ambulatory Visit: Payer: Medicare Other | Admitting: Physical Therapy

## 2017-03-12 DIAGNOSIS — R262 Difficulty in walking, not elsewhere classified: Secondary | ICD-10-CM

## 2017-03-12 DIAGNOSIS — M6281 Muscle weakness (generalized): Secondary | ICD-10-CM | POA: Diagnosis not present

## 2017-03-12 NOTE — Therapy (Signed)
[facility] [address] [place], [place], [address] Phone: [phone]   Fax:  [phone]  Physical Therapy Treatment  Patient Details  Name: Eric George MRN: [id number] Date of Birth: 13-May-1930 Referring Provider: Eulas Post   Encounter Date: 03/12/2017  PT End of Session - 03/12/17 1116    Visit Number  14    Number of Visits  20    Date for PT Re-Evaluation  03/16/17    Authorization Type  G-codes updated 9th visit     PT Start Time  1101    PT Stop Time  1142    PT Time Calculation (min)  41 min    Activity Tolerance  Patient tolerated treatment well;No increased pain    Behavior During Therapy  WFL for tasks assessed/performed       Past Medical History:  Diagnosis Date  . Bowel obstruction ([place])   . CAP (community acquired pneumonia)   . 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 GERD (gastroesophageal reflux disease)   . Hydrocephalus   . Hyperlipidemia   . Hypertension   . Osteoarthritis, multiple sites 07/08/2016  . 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 Stroke ([place])   . TBI (traumatic brain injury) [facility])     Past Surgical History:  Procedure Laterality Date  . BRAIN SURGERY    . HERNIA REPAIR    . PROSTATECTOMY      There were no vitals filed for this visit.  Subjective Assessment - 03/12/17 1102    Subjective  Pt reports that things are going well. He has no pain or complaints at this time.     Patient is accompained by:  Family member wife    Pertinent History  chronic neck pain    Limitations  Other (comment);Walking;Standing    Patient Stated Goals  going up and down steps, increased endurance, more steadiness    Currently in Pain?  No/denies    Pain Onset  More than a month ago                      [facility] Adult PT Treatment/Exercise - 03/12/17 0001      Therapeutic Activites    Therapeutic Activities  Other  Therapeutic Activities    Other Therapeutic Activities  Ascending/descending 6" steps with 0-1 handrails with SPC, x4 trials, therapist providing cues to decrease speed and improve awareness of foot placement       Knee/Hip Exercises: Stretches   Piriformis Stretch  3 reps;30 seconds;Both    Piriformis Stretch Limitations  supine figure 4       Knee/Hip Exercises: Standing   Heel Raises  Both;2 sets;20 reps    Knee Flexion  Both;1 set;15 reps on foam with 1 UE support     Hip Flexion  Both;1 set;15 reps;Knee bent on foam with 1 UE support     Hip Abduction  Both;1 set;15 reps on foam with 2 UE support     Hip Extension  Both;1 set;15 reps on foam with 2 UE support     Forward Step Up  Both;Hand Hold: 2;2 sets;10 reps;Step Height: 6"    Other Standing Knee Exercises  step over/back with single LE and SPC x10 reps each    Other Standing Knee Exercises  Single leg stance on foam x5 reps each up to 5 sec at a time, increased tendency  to use UE support when standing on the Rt       Knee/Hip Exercises: Supine   Straight Leg Raises  Both;2 sets;15 reps    Straight Leg Raises Limitations  cues to decrease eccentric lowering speed              PT Education - 03/12/17 1147    Education provided  Yes    Education Details  discussed techniques and verbal cues with pt's wife to improve awareness during stair negotiation; reminded pt of upcoming re-evaluation     Person(s) Educated  Patient    Methods  Explanation    Comprehension  Verbalized understanding       PT Short Term Goals - 02/16/17 1451      PT SHORT TERM GOAL #1   Title  increase Berg Balance assessment to 46/56    Baseline  46 baseline Moderate Fall Risk    Time  4    Period  Weeks    Status  On-going      PT SHORT TERM GOAL #2   Title  independent with initial HEP    Time  4    Period  Weeks    Status  Achieved      PT SHORT TERM GOAL #3   Title  5 x sit to stand < or = to 22 seconds for reduced risk of falls     Baseline  14 sec, no UE    Time  4    Period  Weeks    Status  Achieved      PT SHORT TERM GOAL #4   Title  TUG < or = to 14 sec    Baseline  11 sec, UE support on chair sit<>stand    Time  4    Period  Weeks    Status  Achieved        PT Long Term Goals - 02/16/17 1452      PT LONG TERM GOAL #1   Title  independent with advanced HEP    Time  8    Period  Weeks    Status  On-going      PT LONG TERM GOAL #2   Title  5 x sit to stand < or = to 20 sec without using UE for significant reduction in fall risk    Baseline  20 sec    Time  8    Period  Weeks    Status  Achieved      PT LONG TERM GOAL #3   Title  reports experiencing 50% less neck and knee pain due to improved postural stability and LE strength    Baseline  no complaints of neck pain recently    Time  8    Period  Weeks    Status  On-going      PT LONG TERM GOAL #4   Title  improved TUG to 13 sec or less without cane for reduced fall risk    Time  8    Period  Weeks    Status  Achieved      PT LONG TERM GOAL #5   Title  Improved Berg score to > or = to 50/56 for reduced risk of falls    Time  8    Period  Weeks    Status  On-going            Plan - 03/12/17 1149    Clinical Impression Statement  Pt  arrived without any concerns or new issues to report. Session focused on progression of LE strengthening, flexibility and balance activity. Pt was able to complete all standing exercises without any breaks, and reported no pain or fatigue. Pt's wife is concerned over his safety with stairs, so this was reviewed with the pt. He does demonstrate the ability to ascend and descend steps with his [facility] and 1 handrail, however he demonstrates poor safety awareness of proper foot placement. Therapist had to provide specific cues to increase his safety and decrease risk of falling. This was reviewed with the pt's spouse following today's session.     Rehab Potential  Good    Clinical Impairments Affecting Rehab  Potential  chronic neck pain    PT Treatment/Interventions  ADLs/Self Care Home Management;Dry needling;Taping;Passive range of motion;Manual techniques;Patient/family education;Neuromuscular re-education;Balance training;Therapeutic exercise;Therapeutic activities;Stair training;Gait training;Ultrasound;Moist Heat;Electrical Stimulation;Cryotherapy;Traction;Biofeedback    PT Next Visit Plan  re-evaluation and possible d/c; addition to HEP     PT Home Exercise Plan  sit to stand, standing hip extension, B heel raises, sidestepping with green TB around knees     Consulted and Agree with Plan of Care  Patient;Family member/caregiver    Family Member Consulted  wife       Patient will benefit from skilled therapeutic intervention in order to improve the following deficits and impairments:  Abnormal gait, Decreased balance, Decreased coordination, Decreased endurance, Difficulty walking, Decreased strength, Postural dysfunction, Pain  Visit Diagnosis: Difficulty in walking, not elsewhere classified  Muscle weakness (generalized)     Problem List Patient Active Problem List   Diagnosis Date Noted  . Osteoarthritis, multiple sites 07/08/2016  . Risk for falls 07/08/2016  . Premature atrial contractions 05/11/2015  . Sepsis ([place]) 04/29/2015  . History of small bowel obstruction 04/29/2015  . History of traumatic brain injury 04/29/2015  . Acute respiratory failure ([place]) 04/29/2015  . CAP (community acquired pneumonia)   . Unspecified cerebral artery occlusion with cerebral infarction 08/25/2012  . Aphasia 08/25/2012  . Macular degeneration 04/25/2012  . CVA (cerebral infarction) 03/20/2012  . Small bowel obstruction ([place]) 09/12/2011  . Nausea & vomiting 09/12/2011  . Memory loss 04/01/2010  . 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 SKIN LESION 10/24/2009  . NEOPLASM, SKIN, UNCERTAIN BEHAVIOR 16/38/4665  . DYSGEUSIA 10/10/2009  . Hyperlipidemia 07/20/2008  . DEPRESSION  07/20/2008  . Essential hypertension 07/20/2008  . RHINITIS 07/20/2008  . COLONIC POLYPS, HX OF 07/20/2008  . GERD 07/18/2008  . PROSTATE CANCER, HX OF 07/18/2008    11:53 AM,03/12/17 Elly Modena PT, DPT [facility] [address], [place], [place], [address] Phone: [phone]   Fax:  [phone]  Name: Eric George MRN: [id number] Date of Birth: [birth year]

## 2017-03-15 ENCOUNTER — Ambulatory Visit: Payer: Medicare Other | Admitting: Physical Therapy

## 2017-03-15 DIAGNOSIS — R262 Difficulty in walking, not elsewhere classified: Secondary | ICD-10-CM | POA: Diagnosis not present

## 2017-03-15 DIAGNOSIS — M6281 Muscle weakness (generalized): Secondary | ICD-10-CM

## 2017-03-15 NOTE — Patient Instructions (Signed)
  STRAIGHT LEG RAISE - SLR  While lying on your back, raise up your leg with a straight knee.  Keep the opposite knee bent with the foot planted on the ground.    2x15 reps, work up to 20 reps     ELASTIC BAND - SEATED CLAMS  While sitting in a chair and an elastic band wrapped around your knees, move both knees to the sides to separate your legs. Keep contact of your feet on the floor the entire time.     x20 reps with blue band      ELASTIC BAND ROWS   Holding elastic band with both hands, draw back the band as you bend your elbows. Keep your elbows near the side of your body.  2x15 reps, green band     [facility] [address] [place], [place] [address] Phone # [phone] Fax [phone]

## 2017-03-15 NOTE — Therapy (Signed)
[facility] [address], [place], [place], [address] Phone: [phone]   Fax:  [phone]  Physical Therapy Treatment/Discharge  Patient Details  Name: Eric George MRN: [id number] Date of Birth: [birth year] Referring Provider: Eulas Post   Encounter Date: 03/15/2017  PT End of Session - 03/15/17 1544    Visit Number  15    Number of Visits  20    Date for PT Re-Evaluation  03/16/17    Authorization Type  G-codes updated 9th visit     PT Start Time  1525    PT Stop Time  1600    PT Time Calculation (min)  35 min    Activity Tolerance  Patient tolerated treatment well;No increased pain    Behavior During Therapy  WFL for tasks assessed/performed       Past Medical History:  Diagnosis Date  . Bowel obstruction ([place])   . CAP (community acquired pneumonia)   . 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 GERD (gastroesophageal reflux disease)   . Hydrocephalus   . Hyperlipidemia   . Hypertension   . Osteoarthritis, multiple sites 07/08/2016  . 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 Stroke ([place])   . TBI (traumatic brain injury) [facility])     Past Surgical History:  Procedure Laterality Date  . BRAIN SURGERY    . HERNIA REPAIR    . PROSTATECTOMY      There were no vitals filed for this visit.  Subjective Assessment - 03/15/17 1528    Subjective  Pt's wife reports that he is walking better outside and around the house. She feels that he has more stamina with activity as well. She still notices issues with his attention and with getting around in the house.     Patient is accompained by:  Family member wife    Pertinent History  chronic neck pain    Limitations  Other (comment);Walking;Standing    Patient Stated Goals  going up and down steps, increased endurance, more steadiness    Currently in Pain?  No/denies    Pain Onset  More than a month ago          [facility] PT Assessment - 03/15/17 0001      Assessment   Medical Diagnosis  R53.1 (ICD-10-CM) - Weakness;Z91.81 (ICD-10-CM) - At high risk for falls    Onset Date/Surgical Date  -- about a year when balance and neck pain worsened    Hand Dominance  Right    Prior Therapy  yes      Precautions   Precautions  Fall      Restrictions   Weight Bearing Restrictions  No      Balance Screen   Has the patient fallen in the past 6 months  No    Has the patient had a decrease in activity level because of a fear of falling?   Yes    Is the patient reluctant to leave their home because of a fear of falling?   No      Home Environment   Living Environment  Private residence    Living Arrangements  Spouse/significant other    [place] Layout  [place] to go      Prior Function   Level of Independence  Independent    Leisure  going to the [employer]  Cognition   Overall Cognitive Status  Within Functional Limits for tasks assessed      Posture/Postural Control   Posture/Postural Control  Postural limitations    Postural Limitations  Rounded Shoulders;Forward head;Increased thoracic kyphosis locks out knees bilaterally      Strength   Overall Strength  Deficits    Right Hip ABduction  4-/5    Right Hip ADduction  4+/5    Left Hip Flexion  5/5    Left Hip ABduction  4+/5    Left Hip ADduction  4+/5    Left Knee Flexion  5/5    Left Ankle Dorsiflexion  5/5      Transfers   Five time sit to stand comments   13.5 sec, 1 UE support  one UE       Ambulation/Gait   Ambulation/Gait  Yes    Ambulation/Gait Assistance  7: Independent    Ambulation Distance (Feet)  100 Feet    Assistive device  -- no but usually uses SPC    Gait Pattern  Decreased stride length;Lateral trunk lean to right;Lateral trunk lean to left;Right genu recurvatum;Left genu recurvatum      Standardized Balance Assessment   Standardized Balance Assessment  Berg Balance Test      Berg  Balance Test   Sit to Stand  Able to stand  independently using hands    Standing Unsupported  Able to stand safely 2 minutes    Sitting with Back Unsupported but Feet Supported on Floor or Stool  Able to sit safely and securely 2 minutes    Stand to Sit  Sits safely with minimal use of hands    Transfers  Able to transfer safely, minor use of hands    Standing Unsupported with Eyes Closed  Able to stand 10 seconds safely    Standing Ubsupported with Feet Together  Able to place feet together independently and stand 1 minute safely    From Standing, Reach Forward with Outstretched Arm  Can reach forward >12 cm safely (5")    From Standing Position, Pick up Object from Floor  Able to pick up shoe, needs supervision    From Standing Position, Turn to Look Behind Over each Shoulder  Looks behind one side only/other side shows less weight shift    Turn 360 Degrees  Able to turn 360 degrees safely in 4 seconds or less    Standing Unsupported, Alternately Place Feet on Step/Stool  Able to stand independently and safely and complete 8 steps in 20 seconds    Standing Unsupported, One Foot in Front  Able to plae foot ahead of the other independently and hold 30 seconds    Standing on One Leg  Able to lift leg independently and hold 5-10 seconds    Total Score  50      Timed Up and Go Test   Normal TUG (seconds)  18                  [facility] Adult PT Treatment/Exercise - 03/15/17 0001      Knee/Hip Exercises: Seated   Clamshell with TheraBand  Blue x20 reps     Other Seated Knee/Hip Exercises  rows with green TB x15 reps       Knee/Hip Exercises: Supine   Straight Leg Raises  Both;1 set;15 reps             PT Education - 03/15/17 1557    Education provided  Yes  Education Details  reviewed goals; importance of continuing with HEP atleast 3x/week to ensure he maintains progress; discussed results of BERG balance test    Person(s) Educated  Patient    Methods  Explanation     Comprehension  Verbalized understanding       PT Short Term Goals - 03/15/17 1540      PT SHORT TERM GOAL #1   Title  increase Berg Balance assessment to 46/56    Baseline  50/56    Time  4    Period  Weeks    Status  Achieved      PT SHORT TERM GOAL #2   Title  independent with initial HEP    Time  4    Period  Weeks    Status  Achieved      PT SHORT TERM GOAL #3   Title  5 x sit to stand < or = to 22 seconds for reduced risk of falls    Baseline  14 sec, no UE    Time  4    Period  Weeks    Status  Achieved      PT SHORT TERM GOAL #4   Title  TUG < or = to 14 sec    Baseline  11 sec, UE support on chair sit<>stand    Time  4    Period  Weeks    Status  Achieved        PT Long Term Goals - 03/15/17 1541      PT LONG TERM GOAL #1   Title  independent with advanced HEP    Time  8    Period  Weeks    Status  Achieved      PT LONG TERM GOAL #2   Title  5 x sit to stand < or = to 20 sec without using UE for significant reduction in fall risk    Baseline  under 14 sec     Time  8    Period  Weeks    Status  Achieved      PT LONG TERM GOAL #3   Title  reports experiencing 50% less neck and knee pain due to improved postural stability and LE strength    Baseline  no complaints of neck pain recently, increased knee pain today with bad weather    Time  8    Period  Weeks    Status  Not Met      PT LONG TERM GOAL #4   Title  improved TUG to 13 sec or less without cane for reduced fall risk    Time  8    Period  Weeks    Status  Achieved      PT LONG TERM GOAL #5   Title  Improved Berg score to > or = to 50/56 for reduced risk of falls    Baseline  50/56    Time  8    Period  Weeks    Status  Achieved            Plan - 03/15/17 1602    Clinical Impression Statement  Pt was discharged this visit having met nearly all of his short and long term goals since beginning PT. He demonstrates improvements in LE strength, up to 4+ and 5/5 MMT. His functional  strength has improved significantly evident by his ability to complete 5x sit to stand in less than 14 sec with no more than 1  UE support. His balance has improved up to a 50/56 on the Berg balance test, although this still places him in the moderate risk of falls category. Pt has been somewhat inconsistent with his HEP up until the last 2 week or so. He and his wife have noted improvements in his stamina, his safety and his ability to walk outside several times a week. He is agreeable with discharge at this time to allow him to grow increasingly consistent with his advanced HEP and continue with maintenance of his strength, balance, flexibility.    Rehab Potential  Good    Clinical Impairments Affecting Rehab Potential  chronic neck pain    PT Treatment/Interventions  ADLs/Self Care Home Management;Dry needling;Taping;Passive range of motion;Manual techniques;Patient/family education;Neuromuscular re-education;Balance training;Therapeutic exercise;Therapeutic activities;Stair training;Gait training;Ultrasound;Moist Heat;Electrical Stimulation;Cryotherapy;Traction;Biofeedback    PT Next Visit Plan  d/c with HEP    PT Home Exercise Plan  sit to stand, standing hip extension, B heel raises, sidestepping with green TB around knees     Consulted and Agree with Plan of Care  Patient;Family member/caregiver    Family Member Consulted  wife       Patient will benefit from skilled therapeutic intervention in order to improve the following deficits and impairments:  Abnormal gait, Decreased balance, Decreased coordination, Decreased endurance, Difficulty walking, Decreased strength, Postural dysfunction, Pain  Visit Diagnosis: Difficulty in walking, not elsewhere classified  Muscle weakness (generalized)   G-Codes - Apr 06, 2017 1606    Functional Assessment Tool Used (Outpatient Only)  TUG and clinical assessment    Functional Limitation  Mobility: Walking and moving around    Mobility: Walking and Moving  Around Goal Status [phone])  At least 1 percent but less than 20 percent impaired, limited or restricted    Mobility: Walking and Moving Around Discharge Status [phone])  At least 20 percent but less than 40 percent impaired, limited or restricted       Problem List Patient Active Problem List   Diagnosis Date Noted  . Osteoarthritis, multiple sites 07/08/2016  . Risk for falls 07/08/2016  . Premature atrial contractions 05/11/2015  . Sepsis ([place]) 04/29/2015  . History of small bowel obstruction 04/29/2015  . History of traumatic brain injury 04/29/2015  . Acute respiratory failure ([place]) 04/29/2015  . CAP (community acquired pneumonia)   . Unspecified cerebral artery occlusion with cerebral infarction 08/25/2012  . Aphasia 08/25/2012  . Macular degeneration 04/25/2012  . CVA (cerebral infarction) 03/20/2012  . Small bowel obstruction ([place]) 09/12/2011  . Nausea & vomiting 09/12/2011  . Memory loss 04/01/2010  . 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 SKIN LESION 10/24/2009  . NEOPLASM, SKIN, UNCERTAIN BEHAVIOR 19/14/7829  . DYSGEUSIA 10/10/2009  . Hyperlipidemia 07/20/2008  . DEPRESSION 07/20/2008  . Essential hypertension 07/20/2008  . RHINITIS 07/20/2008  . COLONIC POLYPS, HX OF 07/20/2008  . GERD 07/18/2008  . PROSTATE CANCER, HX OF 07/18/2008    PHYSICAL THERAPY DISCHARGE SUMMARY  Visits from Start of Care: 15  Current functional level related to goals / functional outcomes: See above for more details    Remaining deficits: See above for more details    Education / Equipment: See above for more details  Plan: Patient agrees to discharge.  Patient goals were met. Patient is being discharged due to meeting the stated rehab goals.  ?????      4:08 PM,2017/04/06 Elly Modena PT, [facility] [address]  [place], [place], [place],  [address] Phone: [phone]   Fax:  [phone]  Name: Eric George MRN: [id number] Date of Birth: [birth year]

## 2017-03-21 ENCOUNTER — Other Ambulatory Visit: Payer: Self-pay | Admitting: Family Medicine

## 2017-04-13 ENCOUNTER — Other Ambulatory Visit: Payer: Self-pay | Admitting: Family Medicine

## 2017-04-21 DIAGNOSIS — H40013 Open angle with borderline findings, low risk, bilateral: Secondary | ICD-10-CM | POA: Diagnosis not present

## 2017-04-21 DIAGNOSIS — H353132 Nonexudative age-related macular degeneration, bilateral, intermediate dry stage: Secondary | ICD-10-CM | POA: Diagnosis not present

## 2017-04-21 DIAGNOSIS — H35033 Hypertensive retinopathy, bilateral: Secondary | ICD-10-CM | POA: Diagnosis not present

## 2017-04-21 DIAGNOSIS — H35371 Puckering of macula, right eye: Secondary | ICD-10-CM | POA: Diagnosis not present

## 2017-04-30 ENCOUNTER — Encounter: Payer: Self-pay | Admitting: Family Medicine

## 2017-04-30 ENCOUNTER — Ambulatory Visit (INDEPENDENT_AMBULATORY_CARE_PROVIDER_SITE_OTHER): Payer: Medicare Other | Admitting: Family Medicine

## 2017-04-30 VITALS — BP 110/70 | HR 69 | Temp 97.7°F | Ht 75.0 in | Wt 194.2 lb

## 2017-04-30 DIAGNOSIS — I10 Essential (primary) hypertension: Secondary | ICD-10-CM | POA: Diagnosis not present

## 2017-04-30 NOTE — Progress Notes (Signed)
Subjective:     Patient ID: Eric George, male   DOB: [birth year], 83 y.o.   MRN: [id number]  HPI Patient is seen for routine follow-up. He's had some balance issues and high-risk for falls. We started physical therapy last fall and he feels this has helped some. He has not fallen any since then.  Hypertension on Avapro 150 mg daily and also carvedilol 3.125 mg twice daily. No orthostatic symptoms. Blood pressure is well controlled by home readings.  Still has occasional loose stool but improved since last visit  Past Medical History:  Diagnosis Date  . Bowel obstruction ([place])   . CAP (community acquired pneumonia)   . 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 GERD (gastroesophageal reflux disease)   . Hydrocephalus   . Hyperlipidemia   . Hypertension   . Osteoarthritis, multiple sites 07/08/2016  . 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 Stroke ([place])   . TBI (traumatic brain injury) [facility])    Past Surgical History:  Procedure Laterality Date  . BRAIN SURGERY    . HERNIA REPAIR    . PROSTATECTOMY    . TEE WITHOUT CARDIOVERSION  03/22/2012   Procedure: TRANSESOPHAGEAL ECHOCARDIOGRAM (TEE);  Surgeon: [name], MD;  Location: [facility] ENDOSCOPY;  Service: Cardiovascular;  Laterality: N/A;    reports that he quit smoking about 50 years ago. His smoking use included cigarettes. He has a 7.50 pack-year smoking history. he has never used smokeless tobacco. He reports that he does not drink alcohol or use drugs. family history includes Heart disease in his sister. Allergies  Allergen Reactions  . Penicillins Rash     Review of Systems  Constitutional: Negative for fatigue.  Eyes: Negative for visual disturbance.  Respiratory: Negative for cough, chest tightness and shortness of breath.   Cardiovascular: Negative for chest pain, palpitations and leg swelling.  Neurological: Negative for dizziness, syncope, weakness, light-headedness and  headaches.       Objective:   Physical Exam  Constitutional: He is oriented to person, place, and time. He appears well-developed and well-nourished.  HENT:  Right Ear: External ear normal.  Left Ear: External ear normal.  Mouth/Throat: Oropharynx is clear and moist.  Eyes: Pupils are equal, round, and reactive to light.  Neck: Neck supple. No thyromegaly present.  Cardiovascular: Normal rate and regular rhythm.  Pulmonary/Chest: Effort normal and breath sounds normal. No respiratory distress. He has no wheezes. He has no rales.  Musculoskeletal: He exhibits no edema.  Neurological: He is alert and oriented to person, place, and time.       Assessment:     Hypertension stable and at goal    Plan:     -Continue current medications. -Routine follow-up in 6 months and check lipids and chemistries then  Eulas Post MD [facility]

## 2017-05-11 ENCOUNTER — Other Ambulatory Visit: Payer: Self-pay | Admitting: *Deleted

## 2017-05-11 MED ORDER — IRBESARTAN 150 MG PO TABS: 150.0000 mg | ORAL_TABLET | Freq: Every day | ORAL | 1 refills | Status: DC

## 2017-05-24 ENCOUNTER — Other Ambulatory Visit: Payer: Self-pay | Admitting: *Deleted

## 2017-05-24 MED ORDER — CARVEDILOL 3.125 MG PO TABS: 3.1250 mg | ORAL_TABLET | Freq: Two times a day (BID) | ORAL | 2 refills | Status: DC

## 2017-06-28 DIAGNOSIS — H903 Sensorineural hearing loss, bilateral: Secondary | ICD-10-CM | POA: Diagnosis not present

## 2017-07-22 ENCOUNTER — Ambulatory Visit: Payer: Self-pay | Admitting: *Deleted

## 2017-07-22 NOTE — Telephone Encounter (Signed)
Pt's wife called because her husband had fallen last night. He went to the bathroom and did not turn the light on and missed the commode. She states that this is the 3rd fall this year, and it has been happening once a month.  He denies any injuries. No bruises or bleeding noted. He is alert and oriented.  He did have a traumatic brain injury in the 90's, so she feels like it may have something to do with these falls. She is requesting an appointment with his pcp to get him checked out.  Appointment made for tomorrow morning.   Reason for Disposition . Nursing judgment  Protocols used: NO GUIDELINE OR REFERENCE AVAILABLE-A-AH

## 2017-07-23 ENCOUNTER — Encounter: Payer: Self-pay | Admitting: Family Medicine

## 2017-07-23 ENCOUNTER — Ambulatory Visit (INDEPENDENT_AMBULATORY_CARE_PROVIDER_SITE_OTHER): Payer: Medicare Other

## 2017-07-23 ENCOUNTER — Ambulatory Visit (INDEPENDENT_AMBULATORY_CARE_PROVIDER_SITE_OTHER): Payer: Medicare Other | Admitting: Family Medicine

## 2017-07-23 VITALS — BP 130/90 | HR 87 | Temp 97.7°F | Wt 192.1 lb

## 2017-07-23 VITALS — BP 130/90 | Ht 75.0 in | Wt 192.1 lb

## 2017-07-23 DIAGNOSIS — Z0000 Encounter for general adult medical examination without abnormal findings: Secondary | ICD-10-CM | POA: Diagnosis not present

## 2017-07-23 DIAGNOSIS — Z9181 History of falling: Secondary | ICD-10-CM

## 2017-07-23 DIAGNOSIS — R296 Repeated falls: Secondary | ICD-10-CM | POA: Diagnosis not present

## 2017-07-23 DIAGNOSIS — Z87820 Personal history of traumatic brain injury: Secondary | ICD-10-CM | POA: Diagnosis not present

## 2017-07-23 NOTE — Patient Instructions (Addendum)
Mr. Celli , Thank you for taking time to come for your Medicare Wellness Visit. I appreciate your ongoing commitment to your health goals. Please review the following plan we discussed and let me know if I can assist you in the future.   Just FYI, Shingrix is a vaccine for the prevention of Shingles in Adults 50 and older.  Medicare patients get these at the pharmacy, as these injections are covered under your part D. Please check with your benefits regarding applicable copays or out of pocket expenses.  The Shingrix is given in 2 vaccines approx 8 weeks apart. You must receive the 2nd dose prior to 6 months from receipt of the first.  Supplies are low and there may be a waiting list    These are the goals we discussed: Goals    . Patient Stated     Need to get him out of the home Check with the Senior Line          This is a list of the screening recommended for you and due dates:  Health Maintenance  Topic Date Due  . Tetanus Vaccine  04/01/2020  . Flu Shot  Completed  . Pneumonia vaccines  Completed   Call me if you have issues with these services Manuela Schwartz at [phone].   [employer] Centers for independent living   [facility]; [phone] Sr. [place]; [phone] Get resource to get information on any and all community programs for Seniors  Community solutions; "Aging Gracefully In Place" program; can request or apply  [employer]: [phone] Community Health Response Program -[phone] Public Health Dept; Need to be a skilled visit but can assist with bathing as well; [phone]  Adult center for Enrichment;  Call Senior Line; [phone]  Adult day services include Adult Day Care, Adult Day Healthcare, Group Respite, Care Partners, Volunteer In [employer], Education and Support Program  Dept of Social Services; Call [phone] and ask for SW on call  Options for Medicaid include  the Community Alternatives program; [place]-[facility].org (personal care services) or PACE program, which is a medical and social program combined  Braulio Conte manages the community Alternatives program at the [employer]; [phone] (this is a program with a waiting list but provides SNF care at home;  [facility] required; Call Claiborne Billings and she will send out packet of information   Caregiver support group and information regarding [place] is at the; [facility] Address: [address] [place], [place] [address]  Phone: [phone]   [employer]pl general resources for food etc   Http://nihseniorhealth.giv  Deaf & Hard of Hearing Division Services - can assist with hearing aid x 1  No reviews  [place]  [address]  [phone]  Resource to find disability equipment (used) online; https://bradley.com/  Up walker for those that need more support!  Prevention of falls: Remove rugs or any tripping hazards in the home Use Non slip mats in bathtubs and showers Placing grab bars next to the toilet and or shower Placing handrails on both sides of the stair way Adding extra lighting in the home.   Personal safety issues reviewed:  1. Consider starting a community watch program per [facility] 2.  Changes batteries is smoke detector and/or carbon monoxide detector  3.  If you have firearms; keep them in a safe place 4.  Wear protection when in the sun; Always wear sunscreen or a hat; It is good  to have your doctor check your skin annually or review any new areas of concern 5. Driving safety; Keep in the right lane; stay 3 car lengths behind the car in front of you on the highway; look 3 times prior to pulling out; carry your cell phone everywhere you go!    Learn about the Yellow Dot program:  The program allows first responders at your emergency to have access to who your physician  is, as well as your medications and medical conditions.  Citizens requesting the Yellow Dot Packages should contact Master Corporal Nunzio Cobbs at the [facility] [phone] for the first week of the program and beginning the week after Easter citizens should contact their Scientist, physiological.       Health Maintenance, Male A healthy lifestyle and preventive care is important for your health and wellness. Ask your health care provider about what schedule of regular examinations is right for you. What should I know about weight and diet? Eat a Healthy Diet  Eat plenty of vegetables, fruits, whole grains, low-fat dairy products, and lean protein.  Do not eat a lot of foods high in solid fats, added sugars, or salt.  Maintain a Healthy Weight Regular exercise can help you achieve or maintain a healthy weight. You should:  Do at least 150 minutes of exercise each week. The exercise should increase your heart rate and make you sweat (moderate-intensity exercise).  Do strength-training exercises at least twice a week.  Watch Your Levels of Cholesterol and Blood Lipids  Have your blood tested for lipids and cholesterol every 5 years starting at 82 years of age. If you are at high risk for heart disease, you should start having your blood tested when you are 82 years old. You may need to have your cholesterol levels checked more often if: ? Your lipid or cholesterol levels are high. ? You are older than 82 years of age. ? You are at high risk for heart disease.  What should I know about cancer screening? Many types of cancers can be detected early and may often be prevented. Lung Cancer  You should be screened every year for lung cancer if: ? You are a current smoker who has smoked for at least 30 years. ? You are a former smoker who has quit within the past 15 years.  Talk to your health care provider about your screening options, when you should  start screening, and how often you should be screened.  Colorectal Cancer  Routine colorectal cancer screening usually begins at 82 years of age and should be repeated every 5-10 years until you are 82 years old. You may need to be screened more often if early forms of precancerous polyps or small growths are found. Your health care provider may recommend screening at an earlier age if you have risk factors for colon cancer.  Your health care provider may recommend using home test kits to check for hidden blood in the stool.  A small camera at the end of a tube can be used to examine your colon (sigmoidoscopy or colonoscopy). This checks for the earliest forms of colorectal cancer.  Prostate and Testicular Cancer  Depending on your age and overall health, your health care provider may do certain tests to screen for prostate and testicular cancer.  Talk to your health care provider about any symptoms or concerns you have about testicular or prostate cancer.  Skin Cancer  Check your skin from head to  toe regularly.  Tell your health care provider about any new moles or changes in moles, especially if: ? There is a change in a mole's size, shape, or color. ? You have a mole that is larger than a pencil eraser.  Always use sunscreen. Apply sunscreen liberally and repeat throughout the day.  Protect yourself by wearing long sleeves, pants, a wide-brimmed hat, and sunglasses when outside.  What should I know about heart disease, diabetes, and high blood pressure?  If you are 4-67 years of age, have your blood pressure checked every 3-5 years. If you are 64 years of age or older, have your blood pressure checked every year. You should have your blood pressure measured twice-once when you are at a hospital or clinic, and once when you are not at a hospital or clinic. Record the average of the two measurements. To check your blood pressure when you are not at a hospital or clinic, you can  use: ? An automated blood pressure machine at a pharmacy. ? A home blood pressure monitor.  Talk to your health care provider about your target blood pressure.  If you are between 77-70 years old, ask your health care provider if you should take aspirin to prevent heart disease.  Have regular diabetes screenings by checking your fasting blood sugar level. ? If you are at a normal weight and have a low risk for diabetes, have this test once every three years after the age of 28. ? If you are overweight and have a high risk for diabetes, consider being tested at a younger age or more often.  A one-time screening for abdominal aortic aneurysm (AAA) by ultrasound is recommended for men aged 11-75 years who are current or former smokers. What should I know about preventing infection? Hepatitis B If you have a higher risk for hepatitis B, you should be screened for this virus. Talk with your health care provider to find out if you are at risk for hepatitis B infection. Hepatitis C Blood testing is recommended for:  Everyone born from [birth year] through 1963.  Anyone with known risk factors for hepatitis C.  Sexually Transmitted Diseases (STDs)  You should be screened each year for STDs including gonorrhea and chlamydia if: ? You are sexually active and are younger than 82 years of age. ? You are older than 82 years of age and your health care provider tells you that you are at risk for this type of infection. ? Your sexual activity has changed since you were last screened and you are at an increased risk for chlamydia or gonorrhea. Ask your health care provider if you are at risk.  Talk with your health care provider about whether you are at high risk of being infected with HIV. Your health care provider may recommend a prescription medicine to help prevent HIV infection.  What else can I do?  Schedule regular health, dental, and eye exams.  Stay current with your vaccines  (immunizations).  Do not use any tobacco products, such as cigarettes, chewing tobacco, and e-cigarettes. If you need help quitting, ask your health care provider.  Limit alcohol intake to no more than 2 drinks per day. One drink equals 12 ounces of beer, 5 ounces of wine, or 1 ounces of hard liquor.  Do not use street drugs.  Do not share needles.  Ask your health care provider for help if you need support or information about quitting drugs.  Tell your health care provider if you  often feel depressed.  Tell your health care provider if you have ever been abused or do not feel safe at home. This information is not intended to replace advice given to you by your health care provider. Make sure you discuss any questions you have with your health care provider. Document Released: 10/17/2007 Document Revised: 12/18/2015 Document Reviewed: 01/22/2015 Elsevier Interactive Patient Education  [employer].
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
## 2017-07-23 NOTE — Patient Instructions (Signed)
Fall Prevention in the Home Falls can cause injuries and can affect people from all age groups. There are many simple things that you can do to make your home safe and to help prevent falls. What can I do on the outside of my home?  Regularly repair the edges of walkways and driveways and fix any cracks.  Remove high doorway thresholds.  Trim any shrubbery on the main path into your home.  Use bright outdoor lighting.  Clear walkways of debris and clutter, including tools and rocks.  Regularly check that handrails are securely fastened and in good repair. Both sides of any steps should have handrails.  Install guardrails along the edges of any raised decks or porches.  Have leaves, snow, and ice cleared regularly.  Use sand or salt on walkways during winter months.  In the garage, clean up any spills right away, including grease or oil spills. What can I do in the bathroom?  Use night lights.  Install grab bars by the toilet and in the tub and shower. Do not use towel bars as grab bars.  Use non-skid mats or decals on the floor of the tub or shower.  If you need to sit down while you are in the shower, use a plastic, non-slip stool.  Keep the floor dry. Immediately clean up any water that spills on the floor.  Remove soap buildup in the tub or shower on a regular basis.  Attach bath mats securely with double-sided non-slip rug tape.  Remove throw rugs and other tripping hazards from the floor. What can I do in the bedroom?  Use night lights.  Make sure that a bedside light is easy to reach.  Do not use oversized bedding that drapes onto the floor.  Have a firm chair that has side arms to use for getting dressed.  Remove throw rugs and other tripping hazards from the floor. What can I do in the kitchen?  Clean up any spills right away.  Avoid walking on wet floors.  Place frequently used items in easy-to-reach places.  If you need to reach for something above  you, use a sturdy step stool that has a grab bar.  Keep electrical cables out of the way.  Do not use floor polish or wax that makes floors slippery. If you have to use wax, make sure that it is non-skid floor wax.  Remove throw rugs and other tripping hazards from the floor. What can I do in the stairways?  Do not leave any items on the stairs.  Make sure that there are handrails on both sides of the stairs. Fix handrails that are broken or loose. Make sure that handrails are as long as the stairways.  Check any carpeting to make sure that it is firmly attached to the stairs. Fix any carpet that is loose or worn.  Avoid having throw rugs at the top or bottom of stairways, or secure the rugs with carpet tape to prevent them from moving.  Make sure that you have a light switch at the top of the stairs and the bottom of the stairs. If you do not have them, have them installed. What are some other fall prevention tips?  Wear closed-toe shoes that fit well and support your feet. Wear shoes that have rubber soles or low heels.  When you use a stepladder, make sure that it is completely opened and that the sides are firmly locked. Have someone hold the ladder while you are using   it. Do not climb a closed stepladder.  Add color or contrast paint or tape to grab bars and handrails in your home. Place contrasting color strips on the first and last steps.  Use mobility aids as needed, such as canes, walkers, scooters, and crutches.  Turn on lights if it is dark. Replace any light bulbs that burn out.  Set up furniture so that there are clear paths. Keep the furniture in the same spot.  Fix any uneven floor surfaces.  Choose a carpet design that does not hide the edge of steps of a stairway.  Be aware of any and all pets.  Review your medicines with your healthcare provider. Some medicines can cause dizziness or changes in blood pressure, which increase your risk of falling. Talk with  your health care provider about other ways that you can decrease your risk of falls. This may include working with a physical therapist or trainer to improve your strength, balance, and endurance. This information is not intended to replace advice given to you by your health care provider. Make sure you discuss any questions you have with your health care provider. Document Released: 04/10/2002 Document Revised: 09/17/2015 Document Reviewed: 05/25/2014 Elsevier Interactive Patient Education  [employer].  Please use cane in home at ALL times.

## 2017-07-25 DIAGNOSIS — Z87820 Personal history of traumatic brain injury: Secondary | ICD-10-CM | POA: Insufficient documentation

## 2017-07-27 ENCOUNTER — Encounter: Payer: Self-pay | Admitting: Neurology

## 2017-08-17 DIAGNOSIS — K219 Gastro-esophageal reflux disease without esophagitis: Secondary | ICD-10-CM | POA: Diagnosis not present

## 2017-08-17 DIAGNOSIS — K582 Mixed irritable bowel syndrome: Secondary | ICD-10-CM | POA: Diagnosis not present

## 2017-08-30 ENCOUNTER — Encounter: Payer: Self-pay | Admitting: Neurology

## 2017-09-07 DIAGNOSIS — L821 Other seborrheic keratosis: Secondary | ICD-10-CM | POA: Diagnosis not present

## 2017-09-07 DIAGNOSIS — Z85828 Personal history of other malignant neoplasm of skin: Secondary | ICD-10-CM | POA: Diagnosis not present

## 2017-09-07 DIAGNOSIS — L738 Other specified follicular disorders: Secondary | ICD-10-CM | POA: Diagnosis not present

## 2017-09-12 ENCOUNTER — Other Ambulatory Visit: Payer: Self-pay | Admitting: Family Medicine

## 2017-09-14 ENCOUNTER — Encounter: Payer: Self-pay | Admitting: Family Medicine

## 2017-09-15 MED ORDER — SERTRALINE HCL 100 MG PO TABS: 100.0000 mg | ORAL_TABLET | Freq: Every day | ORAL | 0 refills | Status: DC

## 2017-10-29 ENCOUNTER — Ambulatory Visit (INDEPENDENT_AMBULATORY_CARE_PROVIDER_SITE_OTHER): Payer: Medicare Other | Admitting: Family Medicine

## 2017-10-29 ENCOUNTER — Encounter: Payer: Self-pay | Admitting: Family Medicine

## 2017-10-29 VITALS — BP 120/78 | HR 70 | Temp 97.7°F | Wt 184.9 lb

## 2017-10-29 DIAGNOSIS — R296 Repeated falls: Secondary | ICD-10-CM | POA: Diagnosis not present

## 2017-10-29 DIAGNOSIS — E785 Hyperlipidemia, unspecified: Secondary | ICD-10-CM | POA: Diagnosis not present

## 2017-10-29 DIAGNOSIS — I10 Essential (primary) hypertension: Secondary | ICD-10-CM | POA: Diagnosis not present

## 2017-10-29 DIAGNOSIS — M720 Palmar fascial fibromatosis [Dupuytren]: Secondary | ICD-10-CM | POA: Diagnosis not present

## 2017-10-29 DIAGNOSIS — Z9181 History of falling: Secondary | ICD-10-CM | POA: Diagnosis not present

## 2017-10-29 LAB — LIPID PANEL
Cholesterol: 152 mg/dL (ref 0–200)
HDL: 28.6 mg/dL — ABNORMAL LOW (ref >39.00)
LDL Cholesterol: 86 mg/dL (ref 0–99)
NonHDL: 123.8
Total CHOL/HDL Ratio: 5
Triglycerides: 189 mg/dL — ABNORMAL HIGH (ref 0.0–149.0)
VLDL: 37.8 mg/dL (ref 0.0–40.0)

## 2017-10-29 LAB — HEPATIC FUNCTION PANEL
ALT: 11 U/L (ref 0–53)
AST: 13 U/L (ref 0–37)
Albumin: 4.3 g/dL (ref 3.5–5.2)
Alkaline Phosphatase: 87 U/L (ref 39–117)
Bilirubin, Direct: 0.1 mg/dL (ref 0.0–0.3)
Total Bilirubin: 0.5 mg/dL (ref 0.2–1.2)
Total Protein: 6.4 g/dL (ref 6.0–8.3)

## 2017-10-29 NOTE — Patient Instructions (Signed)
Dupuytren Contracture Dupuytren contracture is a condition in which tissue under the skin of the palm becomes abnormally thickened. This causes one or more of the fingers to curl inward (contract) toward the palm. Eventually, the fingers may not be able to straighten out. This condition affects some or all of the fingers and the palm of the hand. It is often passed along from parent to child (inherited). Dupuytren contracture is a long-term (chronic) condition that develops (progresses) slowly over time. There is no cure, but symptoms can be managed and progression can be slowed with treatment. This condition is usually not dangerous or painful, but it can interfere with everyday tasks. What are the causes? This condition is caused by tissue (fascia) in the palm getting thicker and tighter. When the fascia thickens, it pulls on the cords of tissue (tendons) that control finger movement. This causes the fingers to contract. The cause of fascia thickening is not known. What increases the risk? This condition may be more likely to develop in:  People who are age 40 or older.  Men.  People with a family history of this condition.  People who use tobacco products, including cigarettes, chewing tobacco, and e-cigarettes.  People who drink alcohol excessively.  People with diabetes.  People with autoimmune diseases, such as HIV.  People with seizure disorders.  What are the signs or symptoms? Symptoms may develop in one or both hands. Any of the fingers can contract. The fingers farthest from the thumb are commonly affected. Usually, this condition is painless. You may have discomfort when holding or grabbing objects. Early symptoms of this condition may include:  Thick, puckered skin on the hand.  One or more lumps (nodules) on the palm. Nodules may be tender when they first appear, but they are generally painless.  Symptoms of this condition develop slowly over months or years. Later  symptoms of this condition may include:  Thick cords of tissue in the palm.  Fingers curled up toward the palm.  Inability to straighten the fingers into their normal position.  How is this diagnosed? This condition is diagnosed with a physical exam, which may include:  Looking at your hands and feeling your hands. This is to check for thickened fascia and nodules.  Measuring finger motion.  Doing the [place] Tabletop Test. You may be asked to try to put your hand on a surface, with your palm down and your fingers straight out.  How is this treated? There is no cure for this condition, but treatment can make symptoms more manageable and relieve discomfort. Treatment options may include:  Physical therapy. This can strengthen your hand and increase flexibility.  Occupational therapy. This can help you with everyday tasks that may be more difficult because of your condition.  A hand splint.  Shots (injections). Substances may be injected into your hand, such as: ? Medicines that help to decrease swelling (corticosteroids). ? Proteins (collagenase) to weaken thick tissue. After a collagenase injection, your health care provider may stretch your fingers.  Needle aponeurotomy. In this procedure, a needle is pushed through the skin and into the fascia. Moving the needle against the fascia can weaken or break up the thick tissue.  Surgery. This may be needed if your condition causes discomfort or interferes with everyday activities. Physical therapy is usually needed after surgery.  In some cases, symptoms never develop to the point of needing major treatment, and caring for yourself at home can be enough to manage your condition. Symptoms often   return after treatment. Follow these instructions at home: If you have a splint:  Do not put pressure on any part of the splint until it is fully hardened. This may take several hours.  Wear the splint as told by your health care provider.  Remove it only as told by your health care provider.  Loosen the splint if your fingers tingle, become numb, or turn cold and blue.  Do not let your splint get wet if it is not waterproof. ? If your splint is not waterproof, cover it with a watertight covering when you take a bath or a shower. ? Do not take baths, swim, or use a hot tub until your health care provider approves. Ask your health care provider if you can take showers. You may only be allowed to take sponge baths for bathing.  Keep the splint clean.  Ask your health care provider when it is safe to drive. Hand Care  Take these actions to help protect your hand from possible injury: ? Use tools that have padded grips. ? Wear protective gloves while you work with your hands. ? Avoid repetitive hand movements.  Avoid actions that cause pain or discomfort.  Stretch your hand by gently pulling your fingers backward toward your wrist. Do this as often as is comfortable. Stop if this causes pain.  Gently massage your hand as often as is comfortable.  If directed, apply heat to the affected area as often as told by your health care provider. Use the heat source that your health care provider recommends, such as a moist heat pack or a heating pad. ? Place a towel between your skin and the heat source. ? Leave the heat on for 20-30 minutes. ? Remove the heat if your skin turns bright red. This is especially important if you are unable to feel pain, heat, or cold. You may have a greater risk of getting burned. General instructions  Take over-the-counter and prescription medicines only as told by your health care provider.  Manage any other conditions that you have, such as diabetes.  If physical therapy was prescribed, do exercises as told by your health care provider.  Keep all follow-up visits as told by your health care provider. This is important. Contact a health care provider if:  You develop new symptoms, or your  symptoms get worse.  You have pain that gets worse or does not get better with medicine.  You have difficulty or discomfort with everyday tasks.  You have problems with your splint.  You develop numbness or tingling. Get help right away if:  You have severe pain.  Your fingers change color or become unusually cold. This information is not intended to replace advice given to you by your health care provider. Make sure you discuss any questions you have with your health care provider. Document Released: 02/15/2009 Document Revised: 06/04/2015 Document Reviewed: 09/12/2014 Elsevier Interactive Patient Education  2018 [employer]  

## 2017-10-29 NOTE — Progress Notes (Signed)
  Subjective:     Patient ID: Eric George, male   DOB: 1930/06/10, 84 y.o.   MRN: [id number]  HPI Patient for medical follow-up. He has history of hypertension, cerebrovascular disease, history of recurrent small bowel instruction, history of traumatic brain injury, osteoarthritis, frequent falls, remote history of prostate cancer.   Their major concern is frequent falls. His wife states that he falls about once per month. He had extensive physical therapy in past. CT head back in [facility] after fall last year. That showed some chronic frontal encephalomalacia but no acute findings. They have pending follow-up with neurology soon  Patient has contracture of the right fifth digit. Not functionally limiting him. No pain.  Hypertension treated with carvedilol and Avapro. Blood pressures been stable. He is on Lipitor for hyperlipidemia. Overdue for lipid panel.  History of recurrent small bowel obstruction but has not had any occurrences now for a few years. Wife states that she thinks the MiraLAX use seems to correlate with decreased incidents of SBO  Past Medical History:  Diagnosis Date  . Bowel obstruction ([place])   . CAP (community acquired pneumonia)   . 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 GERD (gastroesophageal reflux disease)   . Hydrocephalus   . Hyperlipidemia   . Hypertension   . Osteoarthritis, multiple sites 07/08/2016  . 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 Stroke ([place])   . TBI (traumatic brain injury) [facility])    Past Surgical History:  Procedure Laterality Date  . BRAIN SURGERY    . HERNIA REPAIR    . PROSTATECTOMY    . TEE WITHOUT CARDIOVERSION  03/22/2012   Procedure: TRANSESOPHAGEAL ECHOCARDIOGRAM (TEE);  Surgeon: [name], MD;  Location: [facility] ENDOSCOPY;  Service: Cardiovascular;  Laterality: N/A;    reports that he quit smoking about 51 years ago. His smoking use included cigarettes. He has a 7.50 pack-year  smoking history. He has never used smokeless tobacco. He reports that he does not drink alcohol or use drugs. family history includes Heart disease in his sister. Allergies  Allergen Reactions  . Penicillins Rash     Review of Systems  Constitutional: Negative for appetite change and fatigue.  Eyes: Negative for visual disturbance.  Respiratory: Negative for cough, chest tightness and shortness of breath.   Cardiovascular: Negative for chest pain, palpitations and leg swelling.  Neurological: Negative for syncope, light-headedness and headaches.       Objective:   Physical Exam  Constitutional: He appears well-developed and well-nourished.  Cardiovascular: Normal rate and regular rhythm.  Pulmonary/Chest: Effort normal and breath sounds normal.  Musculoskeletal:  Patient has Dupuytren's contracture right fifth digit       Assessment:     #1 hypertension stable and at goal  #2 dyslipidemia  #3 Dupuytren's contracture right fifth digit  #4 history of frequent falls    Plan:     -Continue current medications -Check lipid and hepatic panel -Discuss Dupuytren's contractures. We discussed that we could send him to hand surgeon at this point but he is not interested in further intervention -Discussed fall prevention -Routine follow-up in 6 months and sooner as needed  Eulas Post MD [facility]

## 2017-10-30 DIAGNOSIS — M720 Palmar fascial fibromatosis [Dupuytren]: Secondary | ICD-10-CM | POA: Insufficient documentation

## 2017-10-30 DIAGNOSIS — R296 Repeated falls: Secondary | ICD-10-CM | POA: Insufficient documentation

## 2017-11-03 ENCOUNTER — Encounter (HOSPITAL_COMMUNITY): Payer: Self-pay | Admitting: Emergency Medicine

## 2017-11-03 ENCOUNTER — Other Ambulatory Visit: Payer: Self-pay

## 2017-11-03 ENCOUNTER — Emergency Department (HOSPITAL_COMMUNITY)
Admission: EM | Admit: 2017-11-03 | Discharge: 2017-11-03 | Disposition: A | Discharge status: Home / Self Care | Payer: Medicare Other | Attending: Emergency Medicine | Admitting: Emergency Medicine

## 2017-11-03 DIAGNOSIS — Z87891 Personal history of nicotine dependence: Secondary | ICD-10-CM | POA: Diagnosis not present

## 2017-11-03 DIAGNOSIS — Z7982 Long term (current) use of aspirin: Secondary | ICD-10-CM | POA: Insufficient documentation

## 2017-11-03 DIAGNOSIS — Z8546 Personal history of malignant neoplasm of prostate: Secondary | ICD-10-CM | POA: Insufficient documentation

## 2017-11-03 DIAGNOSIS — I10 Essential (primary) hypertension: Secondary | ICD-10-CM | POA: Insufficient documentation

## 2017-11-03 DIAGNOSIS — Z79899 Other long term (current) drug therapy: Secondary | ICD-10-CM | POA: Insufficient documentation

## 2017-11-03 DIAGNOSIS — R531 Weakness: Secondary | ICD-10-CM | POA: Diagnosis not present

## 2017-11-03 DIAGNOSIS — W19XXXA Unspecified fall, initial encounter: Secondary | ICD-10-CM | POA: Insufficient documentation

## 2017-11-03 LAB — COMPREHENSIVE METABOLIC PANEL
ALT: 15 U/L (ref 0–44)
AST: 15 U/L (ref 15–41)
Albumin: 3.4 g/dL — ABNORMAL LOW (ref 3.5–5.0)
Alkaline Phosphatase: 75 U/L (ref 38–126)
Anion gap: 11 (ref 5–15)
BUN: 14 mg/dL (ref 8–23)
CO2: 21 mmol/L — ABNORMAL LOW (ref 22–32)
Calcium: 9.1 mg/dL (ref 8.9–10.3)
Chloride: 109 mmol/L (ref 98–111)
Creatinine, Ser: 0.89 mg/dL (ref 0.61–1.24)
GFR calc Af Amer: >60 mL/min (ref >60)
GFR calc non Af Amer: >60 mL/min (ref >60)
Glucose, Bld: 107 mg/dL — ABNORMAL HIGH (ref 70–99)
Potassium: 4 mmol/L (ref 3.5–5.1)
Sodium: 141 mmol/L (ref 135–145)
Total Bilirubin: 0.9 mg/dL (ref 0.3–1.2)
Total Protein: 5.6 g/dL — ABNORMAL LOW (ref 6.5–8.1)

## 2017-11-03 LAB — CBC WITH DIFFERENTIAL/PLATELET
Abs Immature Granulocytes: 0.1 10*3/uL (ref 0.0–0.1)
Basophils Absolute: 0 10*3/uL (ref 0.0–0.1)
Basophils Relative: 0 %
Eosinophils Absolute: 0.4 10*3/uL (ref 0.0–0.7)
Eosinophils Relative: 4 %
HCT: 45.6 % (ref 39.0–52.0)
Hemoglobin: 14.8 g/dL (ref 13.0–17.0)
Immature Granulocytes: 1 %
Lymphocytes Relative: 14 %
Lymphs Abs: 1.4 10*3/uL (ref 0.7–4.0)
MCH: 28.4 pg (ref 26.0–34.0)
MCHC: 32.5 g/dL (ref 30.0–36.0)
MCV: 87.4 fL (ref 78.0–100.0)
Monocytes Absolute: 0.8 10*3/uL (ref 0.1–1.0)
Monocytes Relative: 8 %
Neutro Abs: 7.5 10*3/uL (ref 1.7–7.7)
Neutrophils Relative %: 73 %
Platelets: 219 10*3/uL (ref 150–400)
RBC: 5.22 MIL/uL (ref 4.22–5.81)
RDW: 13.2 % (ref 11.5–15.5)
WBC: 10.1 10*3/uL (ref 4.0–10.5)

## 2017-11-03 LAB — MAGNESIUM: Magnesium: 2 mg/dL (ref 1.7–2.4)

## 2017-11-03 MED ORDER — SODIUM CHLORIDE 0.9 % IV SOLN
INTRAVENOUS | Status: DC
  Administered 2017-11-03: 11:00:00 via INTRAVENOUS

## 2017-11-03 MED ORDER — SODIUM CHLORIDE 0.9 % IV BOLUS
500.0000 mL | Freq: Once | INTRAVENOUS | Status: AC
  Administered 2017-11-03: 500 mL via INTRAVENOUS

## 2017-11-03 MED ORDER — ALUM & MAG HYDROXIDE-SIMETH 200-200-20 MG/5ML PO SUSP
30.0000 mL | Freq: Once | ORAL | Status: AC
  Administered 2017-11-03: 30 mL via ORAL
  Filled 2017-11-03: qty 30

## 2017-11-03 NOTE — ED Provider Notes (Signed)
[facility] EMERGENCY DEPARTMENT Provider Note   CSN: [id number] Arrival date & time: 11/03/17  8182     History   Chief Complaint Chief Complaint  Patient presents with  . Weakness    HPI Eric George is a 84 y.o. male.  HPI   He is here to be evaluated for weakness, and falling.  Symptoms are recurrent and multiple.  He saw his PCP for the same, 5 days ago at that time was referred to neurology for further evaluation.  Patient is unable to give history.  According to the patient's wife, yesterday he was getting a haircut, stood up, became lightheaded and pale.  She was able to get him to move a chair where he sat down and got better.  She took him home and he engaged in his usual activities, while there.  This morning he got up and felt lightheaded again, therefore she brought him here without giving him his morning medications.  Patient has been evaluated in the past year by home health services including physical therapy.  His symptoms have been ongoing for at least 18 months.  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  There is been no recent fever, chills, cough, change in bowel or urinary habits.  The patient is able to walk as much as a mile, outside but tends to have worsening problems with falls at home.  There are no known specific recent injuries to be concerned about according to the wife.  Level 5 caveat-altered mental status     Past Medical History:  Diagnosis Date  . Bowel obstruction ([place])   . CAP (community acquired pneumonia)   . 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 GERD (gastroesophageal reflux disease)   . Hydrocephalus   . Hyperlipidemia   . Hypertension   . Osteoarthritis, multiple sites 07/08/2016  . 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 Stroke ([place])   . TBI (traumatic brain injury) [facility])     Patient Active Problem List   Diagnosis Date Noted  . Dupuytren's  contracture 10/30/2017  . Frequent falls 10/30/2017  . History of closed head injury 07/25/2017  . Osteoarthritis, multiple sites 07/08/2016  . Risk for falls 07/08/2016  . Premature atrial contractions 05/11/2015  . Sepsis ([place]) 04/29/2015  . History of small bowel obstruction 04/29/2015  . History of traumatic brain injury 04/29/2015  . Acute respiratory failure ([place]) 04/29/2015  . CAP (community acquired pneumonia)   . Unspecified cerebral artery occlusion with cerebral infarction 08/25/2012  . Aphasia 08/25/2012  . Macular degeneration 04/25/2012  . CVA (cerebral infarction) 03/20/2012  . Small bowel obstruction ([place]) 09/12/2011  . Nausea & vomiting 09/12/2011  . Memory loss 04/01/2010  . 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 SKIN LESION 10/24/2009  . NEOPLASM, SKIN, UNCERTAIN BEHAVIOR 99/37/1696  . DYSGEUSIA 10/10/2009  . Hyperlipidemia 07/20/2008  . DEPRESSION 07/20/2008  . Essential hypertension 07/20/2008  . RHINITIS 07/20/2008  . COLONIC POLYPS, HX OF 07/20/2008  . GERD 07/18/2008  . PROSTATE CANCER, HX OF 07/18/2008    Past Surgical History:  Procedure Laterality Date  . BRAIN SURGERY    . HERNIA REPAIR    . PROSTATECTOMY    . TEE WITHOUT CARDIOVERSION  03/22/2012   Procedure: TRANSESOPHAGEAL ECHOCARDIOGRAM (TEE);  Surgeon: [name], MD;  Location: [facility];  Service: Cardiovascular;  Laterality: N/A;  Home Medications    Prior to Admission medications   Medication Sig Start Date End Date Taking? Authorizing Provider  acetaminophen (TYLENOL) 325 MG tablet Take 650 mg by mouth every 6 (six) hours as needed.    [provider]  acetic acid-hydrocortisone (VOSOL-HC) otic solution Place 5 drops into both ears every 30 (thirty) days.  02/06/14   [provider]  aspirin 325 MG tablet Take 1 tablet (325 mg total) by mouth daily. 03/23/12   Eugenie Filler, MD  atorvastatin (LIPITOR) 40 MG tablet TAKE 1 TABLET DAILY  AT [facility] 04/14/17   Burchette, Alinda Sierras, MD  carvedilol (COREG) 3.125 MG tablet Take 1 tablet (3.125 mg total) by mouth 2 (two) times daily with a meal. 05/24/17   Burchette, Alinda Sierras, MD  irbesartan (AVAPRO) 150 MG tablet Take 1 tablet (150 mg total) by mouth daily. 05/11/17   Burchette, Alinda Sierras, MD  Multiple Vitamins-Minerals (MACULAR VITAMIN BENEFIT) TABS Take 1 capsule by mouth daily.    [provider]  omeprazole (PRILOSEC) 40 MG capsule Take 40 mg by mouth daily.    [provider]  Probiotic Product (ALIGN) 4 MG CAPS Take 1 capsule by mouth daily.    [provider]  sertraline (ZOLOFT) 100 MG tablet Take 1 tablet (100 mg total) by mouth daily. 09/15/17   Burchette, Alinda Sierras, MD    Family History Family History  Problem Relation Age of Onset  . Heart disease Sister     Social History Social History   Tobacco Use  . Smoking status: Former Smoker    Packs/day: 0.50    Years: 15.00    Pack years: 7.50    Types: Cigarettes    Last attempt to quit: 09/30/1966    Years since quitting: 51.1  . Smokeless tobacco: Never Used  Substance Use Topics  . Alcohol use: No    Alcohol/week: 0.0 oz  . Drug use: No     Allergies   Penicillins   Review of Systems Review of Systems  Unable to perform ROS: Mental status change     Physical Exam Updated Vital Signs BP (!) 175/99   Pulse 66   Temp 97.6 F (36.4 C) (Oral)   Resp (!) 24   Ht 6\' 3"  (1.905 m)   Wt 85 kg (187 lb 6.3 oz)   SpO2 100%   BMI 23.42 kg/m   Physical Exam  Constitutional: He appears well-developed. No distress.  Elderly, frail  HENT:  Head: Normocephalic and atraumatic.  Right Ear: External ear normal.  Left Ear: External ear normal.  Eyes: Pupils are equal, round, and reactive to light. Conjunctivae and EOM are normal.  Neck: Normal range of motion and phonation normal. Neck supple.  Cardiovascular: Normal rate, regular rhythm and normal heart sounds.  Pulmonary/Chest: Effort  normal and breath sounds normal. He exhibits no bony tenderness.  Abdominal: Soft. There is no tenderness.  Musculoskeletal: Normal range of motion.  Normal strength arms and legs bilaterally.  No ataxia.  Neurological: He is alert. No cranial nerve deficit or sensory deficit. He exhibits normal muscle tone. Coordination normal.  Skin: Skin is warm, dry and intact.  Psychiatric: He has a normal mood and affect. His behavior is normal.  Nursing note and vitals reviewed.    ED Treatments / Results  Labs (all labs ordered are listed, but only abnormal results are displayed) Labs Reviewed  COMPREHENSIVE METABOLIC PANEL - Abnormal; Notable for the following components:  Result Value   CO2 21 (*)    Glucose, Bld 107 (*)    Total Protein 5.6 (*)    Albumin 3.4 (*)    All other components within normal limits  CBC WITH DIFFERENTIAL/PLATELET  MAGNESIUM  URINALYSIS, ROUTINE W REFLEX MICROSCOPIC    EKG EKG Interpretation  Date/Time:  Wednesday November 03 2017 09:55:12 EDT Ventricular Rate:  65 PR Interval:    QRS Duration: 153 QT Interval:  429 QTC Calculation: 447 R Axis:   82 Text Interpretation:  Sinus rhythm Ventricular bigeminy Right bundle branch block Since last tracing Right bundle branch block is new Confirmed by Daleen Bo [phone]) on 11/03/2017 11:25:01 AM   Radiology No results found.  Procedures Procedures (including critical care time)  Medications Ordered in ED Medications  0.9 %  sodium chloride infusion ( Intravenous New Bag/Given 11/03/17 1058)  alum & mag hydroxide-simeth (MAALOX/MYLANTA) 200-200-20 MG/5ML suspension 30 mL (has no administration in time range)  sodium chloride 0.9 % bolus 500 mL (0 mLs Intravenous Stopped 11/03/17 1211)     Initial Impression / Assessment and Plan / ED Course  I have reviewed the triage vital signs and the nursing notes.  Pertinent labs & imaging results that were available during my care of the patient were reviewed by  me and considered in my medical decision making (see chart for details).  Clinical Course as of Nov 04 1607  Wed Nov 03, 2017  1539 Normal except glucose high and CO2 low  Comprehensive metabolic panel(!) [EW]  3557 Normal  Magnesium [EW]  1539 Normal  CBC with Differential [EW]    Clinical Course User Index [EW] Daleen Bo, MD     Patient Vitals for the past 24 hrs:  BP Temp Temp src Pulse Resp SpO2 Height Weight  11/03/17 1530 - - - 66 (!) 24 100 % - -  11/03/17 1500 (!) 175/99 - - 74 17 96 % - -  11/03/17 1430 (!) 142/99 - - 74 19 100 % - -  11/03/17 1400 (!) 156/98 - - 64 20 100 % - -  11/03/17 1300 (!) 165/85 - - (!) 53 10 96 % - -  11/03/17 1245 (!) 157/81 - - (!) 53 13 92 % - -  11/03/17 1230 (!) 158/84 - - (!) 57 16 98 % - -  11/03/17 1215 (!) 154/84 - - 62 19 - - -  11/03/17 1200 (!) 146/88 - - (!) 59 18 95 % - -  11/03/17 1145 (!) 168/85 - - 64 (!) 29 97 % - -  11/03/17 1130 (!) 157/85 - - (!) 55 17 96 % - -  11/03/17 1115 (!) 146/87 - - (!) 57 16 95 % - -  11/03/17 1045 - - - (!) 59 (!) 24 95 % - -  11/03/17 1030 (!) 159/82 - - 63 (!) 22 97 % - -  11/03/17 1015 (!) 153/83 - - 63 20 98 % - -  11/03/17 1000 - - - - - - 6\' 3"  (1.905 m) 85 kg (187 lb 6.3 oz)  11/03/17 0958 138/88 97.6 F (36.4 C) Oral 71 (!) 24 96 % - -    4:05 PM Reevaluation with update and discussion. After initial assessment and treatment, an updated evaluation reveals no change in clinical status.  Major components of physical exam repeated and are unchanged.  Findings discussed with patient and the patient's son, all questions answered. Daleen Bo   Medical Decision Making: Patient  with cognitive dysfunction, prior traumatic brain injury, and multiple falls.  Symptoms are subacute and persistent.  No significant change in symptoms.  No significant injury appreciated on evaluation.  He has follow-up scheduled for next week with neurologist, which will be an important part of his evaluation  since it has not yet been done.  Today there is no sign of acute unstable medical condition including bacterial infection, metabolic instability or suggestion for hemodynamic instability.  Doubt CVA.  Patient is therefore stable for discharge with outpatient management to follow-up with PCP as needed and neurology as scheduled.  CRITICAL CARE- no Performed by: Daleen Bo    Final Clinical Impressions(s) / ED Diagnoses   Final diagnoses:  Weakness  Fall, initial encounter    ED Discharge Orders    None       Daleen Bo, MD 11/03/17 1609

## 2017-11-03 NOTE — ED Triage Notes (Signed)
Per EMS: pt from home with wife with c/o generalized weakness x3 days.  Pt was walking to bathroom and almost passed out, wife had to assist pt from bathroom.  Per wife, pt ambulates independently.  Pt has a hx of TBI x30 years ago and can be slow to answer questions.

## 2017-11-03 NOTE — ED Notes (Signed)
Pt states he was getting up from the barber chair and fell 2 days ago.

## 2017-11-03 NOTE — Discharge Instructions (Signed)
Follow-up with his neurologist for the appointment next week as scheduled.  Make sure he is getting plenty of rest, drinking a lot of fluids, and eating 3 meals a day.  Continue taking usual medications.  Be careful and sit down if feeling weak or dizzy when standing.  Return here, if needed, for problems.

## 2017-11-03 NOTE — ED Notes (Signed)
Pt and family aware we need urine for a specimen.

## 2017-11-10 ENCOUNTER — Ambulatory Visit (INDEPENDENT_AMBULATORY_CARE_PROVIDER_SITE_OTHER): Payer: Medicare Other | Admitting: Neurology

## 2017-11-10 ENCOUNTER — Encounter: Payer: Self-pay | Admitting: Neurology

## 2017-11-10 ENCOUNTER — Encounter

## 2017-11-10 VITALS — BP 112/58 | HR 101 | Ht 74.0 in | Wt 186.0 lb

## 2017-11-10 DIAGNOSIS — R2681 Unsteadiness on feet: Secondary | ICD-10-CM

## 2017-11-10 DIAGNOSIS — Z87820 Personal history of traumatic brain injury: Secondary | ICD-10-CM

## 2017-11-10 DIAGNOSIS — R296 Repeated falls: Secondary | ICD-10-CM | POA: Diagnosis not present

## 2017-11-10 NOTE — Patient Instructions (Addendum)
We will check a CT of head to evaluate for any changes from prior imaging that may be contributing to the falls Discuss with Dr. Elease Hashimoto about another round of physical therapy Discuss with Dr. Elease Hashimoto about the dizziness and near-passing out  We have sent a referral to [facility] for your MRI and they will call you directly to schedule your appt. They are located at [place]. If you need to contact them directly please call [phone].
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
## 2017-11-15 ENCOUNTER — Encounter: Payer: Self-pay | Admitting: Family Medicine

## 2017-11-15 ENCOUNTER — Ambulatory Visit (INDEPENDENT_AMBULATORY_CARE_PROVIDER_SITE_OTHER): Payer: Medicare Other | Admitting: Family Medicine

## 2017-11-15 VITALS — BP 100/68 | HR 85 | Temp 98.0°F | Wt 185.3 lb

## 2017-11-15 DIAGNOSIS — Z87820 Personal history of traumatic brain injury: Secondary | ICD-10-CM

## 2017-11-15 DIAGNOSIS — I10 Essential (primary) hypertension: Secondary | ICD-10-CM | POA: Diagnosis not present

## 2017-11-15 DIAGNOSIS — R296 Repeated falls: Secondary | ICD-10-CM | POA: Diagnosis not present

## 2017-11-15 NOTE — Progress Notes (Signed)
Subjective:     Patient ID: Eric George, male   DOB: 1930/11/05, 84 y.o.   MRN: [id number]  HPI Patient seen for ER follow-up. His chronic problems include hypertension, history of CVA, GERD, history of recurrent small bowel obstruction, remote history of traumatic brain injury, osteoarthritis involving multiple sites, hyperlipidemia, history depression, remote history of prostate cancer, high-risk for falls  Recently was sent to neurology. No evidence for Parkinson's disease or normal pressure hydrocephalus. Follow-up CT scan has been ordered.  Recent ER visit on 11/03/17. He apparently was getting his haircut the day prior to ER visit and stood up and was fairly lightheaded and pale. His symptoms gradually improved but then the next day he had another episode of lightheadedness. Blood pressure was fairly elevated in ER with reading 175/99. Pulse oximetry 100%. Other vital signs stable. EKG revealed right bundle branch block with some ventricular bigeminy. Labs no acute normality.  He has history of cognitive dysfunction remote traumatic brain injury and multiple falls as above. He has some generalized weakness. He does walk about a mile per day. We've had in for physical therapy before with questionable benefit. Wife wants to reconsider that at this point.  Does describe some inconsistent orthostatic symptoms but currently only takes 2 medications that would affect blood pressure including Avapro 150 mg daily and carvedilol 3.125 mg twice daily. Blood pressures frequently seems to swings up (> 169 systolic) and down (<678 systolic)  Past Medical History:  Diagnosis Date  . Bowel obstruction ([place])   . CAP (community acquired pneumonia)   . 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 GERD (gastroesophageal reflux disease)   . Hydrocephalus   . Hyperlipidemia   . Hypertension   . Osteoarthritis, multiple sites 07/08/2016  . Short-term memory loss    due to TBI  1990  . Stroke ([place])   . TBI (traumatic brain injury) [facility])    Past Surgical History:  Procedure Laterality Date  . BRAIN SURGERY    . HERNIA REPAIR    . PROSTATECTOMY    . TEE WITHOUT CARDIOVERSION  03/22/2012   Procedure: TRANSESOPHAGEAL ECHOCARDIOGRAM (TEE);  Surgeon: [name], MD;  Location: [facility] ENDOSCOPY;  Service: Cardiovascular;  Laterality: N/A;    reports that he quit smoking about 51 years ago. His smoking use included cigarettes. He has a 7.50 pack-year smoking history. He has never used smokeless tobacco. He reports that he does not drink alcohol or use drugs. family history includes Atrial fibrillation in his sister; Heart disease in his sister; Uterine cancer in his sister. Allergies  Allergen Reactions  . Penicillins Rash     Review of Systems  Constitutional: Negative for appetite change, fatigue and unexpected weight change.  Eyes: Negative for visual disturbance.  Respiratory: Negative for cough, chest tightness and shortness of breath.   Cardiovascular: Negative for chest pain, palpitations and leg swelling.  Gastrointestinal: Negative for abdominal pain.  Neurological: Negative for syncope, weakness and headaches.       Objective:   Physical Exam  Constitutional: He is oriented to person, place, and time. He appears well-developed and well-nourished.  Neck: Neck supple.  Cardiovascular: Normal rate and regular rhythm.  Pulmonary/Chest: Effort normal and breath sounds normal.  Musculoskeletal: He exhibits no edema.  Neurological: He is alert and oriented to person, place, and time. No cranial nerve deficit.       Assessment:     #1 history of hypertension. Currently stable  by today's reading. His readings have been quite variable up and down but no consistent orthostatic symptoms  #2 history of frequent falls  #3 remote history of traumatic brain injury    Plan:     -Patient has follow-up CT head pending-per neurology -Discussed with  wife getting him back in for another trial of physical therapy which seems reasonable -We recommend using a cane for additional support at all times -Monitor blood pressure at home and be in touch if consistently less than 861 systolic -Schedule routine follow-up in a few months and sooner as needed  Eulas Post MD [facility]

## 2017-11-15 NOTE — Patient Instructions (Signed)
Monitor blood pressure and be in touch if systolic (top number) consistently < 100.

## 2017-11-15 NOTE — Telephone Encounter (Signed)
Please advise 

## 2017-11-16 ENCOUNTER — Ambulatory Visit
Admission: RE | Admit: 2017-11-16 | Discharge: 2017-11-16 | Disposition: A | Discharge status: Home / Self Care | Payer: Medicare Other | Source: Ambulatory Visit | Attending: Neurology | Admitting: Neurology

## 2017-11-16 ENCOUNTER — Telehealth: Payer: Self-pay | Admitting: Family Medicine

## 2017-11-16 DIAGNOSIS — R2681 Unsteadiness on feet: Secondary | ICD-10-CM

## 2017-11-16 DIAGNOSIS — R296 Repeated falls: Secondary | ICD-10-CM | POA: Diagnosis not present

## 2017-11-16 DIAGNOSIS — Z87820 Personal history of traumatic brain injury: Secondary | ICD-10-CM

## 2017-11-16 DIAGNOSIS — R269 Unspecified abnormalities of gait and mobility: Secondary | ICD-10-CM | POA: Diagnosis not present

## 2017-11-16 NOTE — Telephone Encounter (Signed)
Patient's wife dropped of a dental treatment clearance form  Fax to: [phone]   ATTN: Harriet Pho  Disposition: Dr's folder

## 2017-11-17 NOTE — Telephone Encounter (Signed)
Form stamped and put in basket to be faxed

## 2017-11-18 ENCOUNTER — Encounter: Payer: Self-pay | Admitting: Neurology

## 2017-11-23 ENCOUNTER — Encounter: Payer: Self-pay | Admitting: Physical Therapy

## 2017-11-23 ENCOUNTER — Other Ambulatory Visit: Payer: Self-pay

## 2017-11-23 ENCOUNTER — Ambulatory Visit: Payer: Medicare Other | Attending: Family Medicine | Admitting: Physical Therapy

## 2017-11-23 DIAGNOSIS — R2681 Unsteadiness on feet: Secondary | ICD-10-CM | POA: Insufficient documentation

## 2017-11-23 DIAGNOSIS — R262 Difficulty in walking, not elsewhere classified: Secondary | ICD-10-CM | POA: Insufficient documentation

## 2017-11-23 DIAGNOSIS — M6281 Muscle weakness (generalized): Secondary | ICD-10-CM | POA: Insufficient documentation

## 2017-11-23 DIAGNOSIS — Z9181 History of falling: Secondary | ICD-10-CM | POA: Insufficient documentation

## 2017-11-23 NOTE — Patient Instructions (Signed)
Access Code: [email or web address]  URL: [email or web address]  Date: 11/23/2017  Prepared by: Elly Modena   Exercises  Heel Raise - 20 reps - 1 sets - 3x daily - 7x weekly    3:10 PM,11/23/17 Sherol Dade PT, [place] [facility] [place]

## 2017-11-23 NOTE — Therapy (Signed)
[facility] [address], [place], [place], [address] Phone: [phone]   Fax:  [phone]  Physical Therapy Evaluation  Patient Details  Name: Eric George MRN: [id number] Date of Birth: 1930/08/13 Referring Provider: Carolann Littler, MD    Encounter Date: 11/23/2017  PT End of Session - 11/23/17 1511    Visit Number  1    Date for PT Re-Evaluation  01/24/18    Authorization Type  Medicare A and B    Authorization Time Period  11/23/17 to 01/24/18    PT Start Time  1425    PT Stop Time  1508    PT Time Calculation (min)  43 min    Activity Tolerance  No increased pain;Patient tolerated treatment well    Behavior During Therapy  [facility] for tasks assessed/performed       Past Medical History:  Diagnosis Date  . Bowel obstruction ([place])   . CAP (community acquired pneumonia)   . 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 GERD (gastroesophageal reflux disease)   . Hydrocephalus   . Hyperlipidemia   . Hypertension   . Osteoarthritis, multiple sites 07/08/2016  . 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 Stroke ([place])   . TBI (traumatic brain injury) [facility])     Past Surgical History:  Procedure Laterality Date  . BRAIN SURGERY    . HERNIA REPAIR    . PROSTATECTOMY    . TEE WITHOUT CARDIOVERSION  03/22/2012   Procedure: TRANSESOPHAGEAL ECHOCARDIOGRAM (TEE);  Surgeon: [name], MD;  Location: [facility] ENDOSCOPY;  Service: Cardiovascular;  Laterality: N/A;    There were no vitals filed for this visit.   Subjective Assessment - 11/23/17 1430    Subjective  Pt's wife reports that he fell back in January and has fallen atleast once a month since then. No injuries noted, but he is also not having good awareness of how he picks up his feet. About 2 weeks ago, he was getting out of the barber's chair and just passed out. He saw his physician who felt there was nothing significant  to treat. He denies dizziness or lightheadedness when standing up.     Pertinent History  HTN, TBI, stroke    Patient Stated Goals  decrease risk of falling     Currently in Pain?  No/denies Lt knee and neck will occasionally bother him          [facility] PT Assessment - 11/23/17 0001      Assessment   Medical Diagnosis  Frequent falls     Referring Provider  Carolann Littler, MD     Prior Therapy  yes approximately 6 months ago       Precautions   Precautions  Fall      Balance Screen   Has the patient fallen in the past 6 months  Yes    How many times?  5 several close falls     Has the patient had a decrease in activity level because of a fear of falling?   Yes    Is the patient reluctant to leave their home because of a fear of falling?   Yes      [place]  Private residence    Living Arrangements  Spouse/significant other      Prior Function   Level of Independence  Independent with basic ADLs  Sensation   Additional Comments  denies numbness and tingling       Posture/Postural Control   Posture Comments  increased thoracic kyphosis, forward head, rounded shoulders       ROM / Strength   AROM / PROM / Strength  Strength      Strength   Strength Assessment Site  Hip;Knee;Ankle    Right/Left Hip  Right;Left    Right Hip Flexion  3/5    Right Hip Extension  5/5    Right Hip ABduction  3/5    Left Hip Flexion  3/5    Left Hip Extension  5/5    Left Hip ABduction  3/5    Right/Left Knee  Right;Left    Right Knee Flexion  4/5    Right Knee Extension  5/5    Left Knee Flexion  4/5    Left Knee Extension  5/5    Right/Left Ankle  Right;Left    Right Ankle Dorsiflexion  5/5    Left Ankle Dorsiflexion  5/5      Flexibility   Soft Tissue Assessment /Muscle Length  yes    Hamstrings  90/90 testing: Rt 40 deg, Lt 45 deg lacking     Quadriceps  Lt 100 deg, Rt 115 deg      Transfers   Five time sit to stand comments   15 sec, crashing  into chair, BUE support       Ambulation/Gait   Gait Comments  (+) trendelenburg on the Rt>Lt; knee hyperextension during stance      Standardized Balance Assessment   Standardized Balance Assessment  Timed Up and Go Test;Berg Balance Test      Berg Balance Test   Sit to Stand  Able to stand  independently using hands    Standing Unsupported  Able to stand safely 2 minutes    Sitting with Back Unsupported but Feet Supported on Floor or Stool  Able to sit safely and securely 2 minutes    Stand to Sit  Uses backs of legs against chair to control descent    Transfers  Able to transfer with verbal cueing and /or supervision    Standing Unsupported with Eyes Closed  Able to stand 10 seconds safely    Standing Ubsupported with Feet Together  Able to place feet together independently and stand for 1 minute with supervision    From Standing, Reach Forward with Outstretched Arm  Reaches forward but needs supervision    From Standing Position, Pick up Object from Floor  Able to pick up shoe, needs supervision    From Standing Position, Turn to Look Behind Over each Shoulder  Needs assist to keep from losing balance and falling    Turn 360 Degrees  Needs close supervision or verbal cueing    Standing Unsupported, Alternately Place Feet on Step/Stool  Able to complete >2 steps/needs minimal assist    Standing Unsupported, One Foot in Front  Able to take small step independently and hold 30 seconds    Standing on One Leg  Able to lift leg independently and hold equal to or more than 3 seconds    Total Score  32      Timed Up and Go Test   TUG Comments  using SPC x15 sec.                 Objective measurements completed on examination: See above findings.      [facility] Adult PT Treatment/Exercise - 11/23/17  0001      Exercises   Exercises  Knee/Hip      Knee/Hip Exercises: Standing   Heel Raises  Both;1 set;20 reps             PT Education - 11/23/17 1510    Education  Details  eval findings/POC; benefits of using walker for balance rather than SPC; HEP implemented and reviewed; importance of adhering to HEP    Person(s) Educated  Patient;Spouse    Methods  Explanation;Handout    Comprehension  Verbalized understanding;Returned demonstration       PT Short Term Goals - 11/23/17 1518      PT SHORT TERM GOAL #1   Title  Pt will be independent with his initial HEP to improve LE strength and balance.    Time  4    Period  Weeks    Status  New      PT SHORT TERM GOAL #2   Title  Pt will demo improved gastroc strength, evident by his ability to complete atleast 5 single leg heel raises on each LE.    Time  4    Period  Weeks    Status  New        PT Long Term Goals - 11/23/17 1519      PT LONG TERM GOAL #1   Title  Pt will be independent with advanced HEP to decrease risk of deconditioning and future falls after discharge from PT.     Time  8    Period  Weeks    Status  New      PT LONG TERM GOAL #2   Title  Pt will complete 5x sit to stand in less than 14 sec without UE support, to reflect improvements in functional strength and power.     Time  8    Period  Weeks    Status  New      PT LONG TERM GOAL #3   Title  Pt will be able to complete the TUG in less than 13 sec, with LRAD, to reflect improvements in his balance and stability with ambulation in the community.     Time  8    Period  Weeks    Status  New      PT LONG TERM GOAL #4   Title  Pt will have atleast 6 point increase in his BERG balance test to reflect a clinically significant improvement in his balance.     Time  8    Period  Weeks    Status  New             Plan - 11/23/17 1512    Clinical Impression Statement  Pt is a pleasant 84 y.o M referred to OPPT for recurring falls over the past several months. He had some success with PT in the past, however he has been unable to maintain his exercises and has since become deconditioned. He demonstrates kyphotic posture,  decreased hip abduction and plantarflexion strength, as well as poor performance on the TUG, 5x sit to stand and BERG balance test. Pt's score of 32 out of 56 places the pt at a high risk of falling at home or in the community and therapist discussed the benefits of using a RW rather than a SPC. Currently, the pt is not interested in this and will require further encouragement moving forward. Pt would benefit from skilled PT to address his limitations in LE strength, poor proprioception and increase  his ability to dual task throughout the day in order to decrease his risk of falls and injury to himself.     History and Personal Factors relevant to plan of care:  frequent falls; TBI/stroke    Clinical Presentation  Evolving    Clinical Presentation due to:  worsening balance, frequent falls     Clinical Decision Making  Moderate    Rehab Potential  Good    PT Frequency  2x / week    PT Duration  8 weeks    PT Treatment/Interventions  ADLs/Self Care Home Management;Moist Heat;Cryotherapy;Therapeutic activities;Functional mobility training;Stair training;Gait training;Therapeutic exercise;Neuromuscular re-education;Balance training;Patient/family education;Manual techniques;Passive range of motion    PT Next Visit Plan  Hip abductor strength, gastroc strength, ankle flexibility; static balance with cognitive task (dual task activity)    PT Home Exercise Plan  Access Code: [email or web address]     Consulted and Agree with Plan of Care  Patient;Family member/caregiver    Family Member Consulted  Spouse       Patient will benefit from skilled therapeutic intervention in order to improve the following deficits and impairments:  Abnormal gait, Decreased activity tolerance, Decreased safety awareness, Decreased strength, Impaired flexibility, Postural dysfunction, Pain, Improper body mechanics, Decreased range of motion, Decreased endurance, Decreased balance, Decreased mobility, Difficulty walking, Hypomobility  Visit  Diagnosis: Unsteadiness on feet  History of falling  Muscle weakness (generalized)     Problem List Patient Active Problem List   Diagnosis Date Noted  . Dupuytren's contracture 10/30/2017  . Frequent falls 10/30/2017  . History of closed head injury 07/25/2017  . Osteoarthritis, multiple sites 07/08/2016  . Risk for falls 07/08/2016  . Premature atrial contractions 05/11/2015  . Sepsis ([place]) 04/29/2015  . History of small bowel obstruction 04/29/2015  . History of traumatic brain injury 04/29/2015  . Acute respiratory failure ([place]) 04/29/2015  . CAP (community acquired pneumonia)   . Unspecified cerebral artery occlusion with cerebral infarction 08/25/2012  . Aphasia 08/25/2012  . Macular degeneration 04/25/2012  . CVA (cerebral infarction) 03/20/2012  . Small bowel obstruction ([place]) 09/12/2011  . Nausea & vomiting 09/12/2011  . Memory loss 04/01/2010  . 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 SKIN LESION 10/24/2009  . NEOPLASM, SKIN, UNCERTAIN BEHAVIOR 84/13/2440  . DYSGEUSIA 10/10/2009  . Hyperlipidemia 07/20/2008  . DEPRESSION 07/20/2008  . Essential hypertension 07/20/2008  . RHINITIS 07/20/2008  . COLONIC POLYPS, HX OF 07/20/2008  . GERD 07/18/2008  . PROSTATE CANCER, HX OF 07/18/2008    3:31 PM,11/23/17 Sherol Dade PT, DPT [place] [facility] [address], [place], [place], [address] Phone: [phone]   Fax:  [phone]  Name: Eric George MRN: [id number] Date of Birth: March 28, 1931

## 2017-11-29 ENCOUNTER — Telehealth: Payer: Self-pay | Admitting: Family Medicine

## 2017-11-29 NOTE — Telephone Encounter (Signed)
Copied from [place] [id number]. Topic: General - Other >> Nov 25, 2017 10:59 AM Judyann Munson wrote: Reason for CRM:  Maudie Mercury from [place] office is calling to confirm a fax was received for the patient medical records . She is requesting someone to give her a call back at [phone]    Fax not received

## 2017-11-30 ENCOUNTER — Ambulatory Visit: Payer: Medicare Other | Admitting: Physical Therapy

## 2017-11-30 ENCOUNTER — Encounter: Payer: Self-pay | Admitting: Physical Therapy

## 2017-11-30 DIAGNOSIS — R262 Difficulty in walking, not elsewhere classified: Secondary | ICD-10-CM

## 2017-11-30 DIAGNOSIS — Z9181 History of falling: Secondary | ICD-10-CM

## 2017-11-30 DIAGNOSIS — R2681 Unsteadiness on feet: Secondary | ICD-10-CM

## 2017-11-30 DIAGNOSIS — M6281 Muscle weakness (generalized): Secondary | ICD-10-CM | POA: Diagnosis not present

## 2017-11-30 NOTE — Therapy (Signed)
[facility] [address] [place], [place], [address] Phone: [phone]   Fax:  [phone]  Physical Therapy Treatment  Patient Details  Name: Eric George MRN: [id number] Date of Birth: 1930/12/10 Referring Provider: Carolann Littler, MD    Encounter Date: 11/30/2017  PT End of Session - 11/30/17 1317    Visit Number  2    Date for PT Re-Evaluation  01/24/18    Authorization Type  Medicare A and B    Authorization Time Period  11/23/17 to 01/24/18    PT Start Time  1231    PT Stop Time  1309    PT Time Calculation (min)  38 min    Activity Tolerance  No increased pain;Patient tolerated treatment well    Behavior During Therapy  [facility] for tasks assessed/performed       Past Medical History:  Diagnosis Date  . Bowel obstruction ([place])   . CAP (community acquired pneumonia)   . 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 GERD (gastroesophageal reflux disease)   . Hydrocephalus   . Hyperlipidemia   . Hypertension   . Osteoarthritis, multiple sites 07/08/2016  . 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 Stroke ([place])   . TBI (traumatic brain injury) [facility])     Past Surgical History:  Procedure Laterality Date  . BRAIN SURGERY    . HERNIA REPAIR    . PROSTATECTOMY    . TEE WITHOUT CARDIOVERSION  03/22/2012   Procedure: TRANSESOPHAGEAL ECHOCARDIOGRAM (TEE);  Surgeon: [name], MD;  Location: [facility] ENDOSCOPY;  Service: Cardiovascular;  Laterality: N/A;    There were no vitals filed for this visit.  Subjective Assessment - 11/30/17 1237    Subjective  Pt's wife reports that she is concerned about the steadiness of hip new cane. Pt reports no issues at the moment.     Pertinent History  HTN, TBI, stroke    Patient Stated Goals  decrease risk of falling     Currently in Pain?  No/denies                       [facility] Adult PT Treatment/Exercise - 11/30/17  0001      Knee/Hip Exercises: Aerobic   Nustep  L4 x4 min, PT present to discuss cane set up      Knee/Hip Exercises: Standing   Other Standing Knee Exercises  active DF x20 reps       Knee/Hip Exercises: Seated   Other Seated Knee/Hip Exercises  heel raises with 5# dumbbells x20 reps       Knee/Hip Exercises: Supine   Other Supine Knee/Hip Exercises  Lt and Rt ankle PF with double green TB 2x15 reps each      Knee/Hip Exercises: Sidelying   Clams  each LE 2x10 reps with green TB          Balance Exercises - 11/30/17 1304      Balance Exercises: Standing   Standing Eyes Opened  Narrow base of support (BOS);Foam/compliant surface trunk rotation with blue weighted ball     Standing Eyes Closed  Narrow base of support (BOS);Foam/compliant surface;2 reps;30 secs    Tandem Stance  Eyes open head turns Lt/Rt x10 reps each LE forward     Other Standing Exercises  standing NBOS with small object pass Lt to Rt x5 trials.  Standing NBOS on foam pad with external perturbations x10 reps    PT Education - 11/30/17 1317    Education Details  technique with therex; updated HEP    Person(s) Educated  Patient;Spouse    Methods  Explanation;Handout;Verbal cues    Comprehension  Verbalized understanding;Returned demonstration       PT Short Term Goals - 11/23/17 1518      PT SHORT TERM GOAL #1   Title  Pt will be independent with his initial HEP to improve LE strength and balance.    Time  4    Period  Weeks    Status  New      PT SHORT TERM GOAL #2   Title  Pt will demo improved gastroc strength, evident by his ability to complete atleast 5 single leg heel raises on each LE.    Time  4    Period  Weeks    Status  New        PT Long Term Goals - 11/23/17 1519      PT LONG TERM GOAL #1   Title  Pt will be independent with advanced HEP to decrease risk of deconditioning and future falls after discharge from PT.     Time  8    Period  Weeks    Status  New      PT LONG  TERM GOAL #2   Title  Pt will complete 5x sit to stand in less than 14 sec without UE support, to reflect improvements in functional strength and power.     Time  8    Period  Weeks    Status  New      PT LONG TERM GOAL #3   Title  Pt will be able to complete the TUG in less than 13 sec, with LRAD, to reflect improvements in his balance and stability with ambulation in the community.     Time  8    Period  Weeks    Status  New      PT LONG TERM GOAL #4   Title  Pt will have atleast 6 point increase in his BERG balance test to reflect a clinically significant improvement in his balance.     Time  8    Period  Weeks    Status  New            Plan - 11/30/17 1317    Clinical Impression Statement  Pt arrived with his spouse who reports he has been consistently completing his HEP since his evaluation 1 week ago. Pt is using his SPC without any difficulty. Session focused on addition of several new exercises for his HEP as well as static balance activity with dual tasking. Pt had increased difficulty with activity on unstable surface, but no LOB was noted. Will continue with current POC.     Rehab Potential  Good    PT Frequency  2x / week    PT Duration  8 weeks    PT Treatment/Interventions  ADLs/Self Care Home Management;Moist Heat;Cryotherapy;Therapeutic activities;Functional mobility training;Stair training;Gait training;Therapeutic exercise;Neuromuscular re-education;Balance training;Patient/family education;Manual techniques;Passive range of motion    PT Next Visit Plan  Hip abductor strength, gastroc strength, ankle flexibility; static balance with cognitive task (dual task activity)    PT Home Exercise Plan  Access Code: [email or web address]     Consulted and Agree with Plan of Care  Patient;Family member/caregiver    Family Member Consulted  Spouse  Patient will benefit from skilled therapeutic intervention in order to improve the following deficits and impairments:  Abnormal gait,  Decreased activity tolerance, Decreased safety awareness, Decreased strength, Impaired flexibility, Postural dysfunction, Pain, Improper body mechanics, Decreased range of motion, Decreased endurance, Decreased balance, Decreased mobility, Difficulty walking, Hypomobility  Visit Diagnosis: Unsteadiness on feet  History of falling  Muscle weakness (generalized)  Difficulty in walking, not elsewhere classified     Problem List Patient Active Problem List   Diagnosis Date Noted  . Dupuytren's contracture 10/30/2017  . Frequent falls 10/30/2017  . History of closed head injury 07/25/2017  . Osteoarthritis, multiple sites 07/08/2016  . Risk for falls 07/08/2016  . Premature atrial contractions 05/11/2015  . Sepsis ([place]) 04/29/2015  . History of small bowel obstruction 04/29/2015  . History of traumatic brain injury 04/29/2015  . Acute respiratory failure ([place]) 04/29/2015  . CAP (community acquired pneumonia)   . Unspecified cerebral artery occlusion with cerebral infarction 08/25/2012  . Aphasia 08/25/2012  . Macular degeneration 04/25/2012  . CVA (cerebral infarction) 03/20/2012  . Small bowel obstruction ([place]) 09/12/2011  . Nausea & vomiting 09/12/2011  . Memory loss 04/01/2010  . 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 SKIN LESION 10/24/2009  . NEOPLASM, SKIN, UNCERTAIN BEHAVIOR 53/74/8270  . DYSGEUSIA 10/10/2009  . Hyperlipidemia 07/20/2008  . DEPRESSION 07/20/2008  . Essential hypertension 07/20/2008  . RHINITIS 07/20/2008  . COLONIC POLYPS, HX OF 07/20/2008  . GERD 07/18/2008  . PROSTATE CANCER, HX OF 07/18/2008    .1:20 PM,11/30/17 Sherol Dade PT, DPT [place] [facility] [place]  [facility] [address] [place], [place], [address] Phone: [phone]   Fax:  [phone]  Name: Eric George MRN: [id number] Date of Birth: 1930/08/18

## 2017-11-30 NOTE — Telephone Encounter (Signed)
Margarett to contact Maudie Mercury to check on status of records release.

## 2017-12-02 ENCOUNTER — Encounter: Payer: Self-pay | Admitting: Physical Therapy

## 2017-12-02 ENCOUNTER — Ambulatory Visit: Payer: Medicare Other | Attending: Family Medicine | Admitting: Physical Therapy

## 2017-12-02 DIAGNOSIS — R2681 Unsteadiness on feet: Secondary | ICD-10-CM | POA: Insufficient documentation

## 2017-12-02 DIAGNOSIS — M6281 Muscle weakness (generalized): Secondary | ICD-10-CM | POA: Diagnosis not present

## 2017-12-02 DIAGNOSIS — R262 Difficulty in walking, not elsewhere classified: Secondary | ICD-10-CM | POA: Insufficient documentation

## 2017-12-02 DIAGNOSIS — Z9181 History of falling: Secondary | ICD-10-CM | POA: Diagnosis not present

## 2017-12-02 NOTE — Therapy (Signed)
[facility] [address] [place], [place], [address] Phone: [phone]   Fax:  [phone]  Physical Therapy Treatment  Patient Details  Name: Eric George MRN: [id number] Date of Birth: Nov 05, 1930 Referring Provider: Carolann Littler, MD    Encounter Date: 12/02/2017  PT End of Session - 12/02/17 1213    Visit Number  3    Date for PT Re-Evaluation  01/24/18    Authorization Type  Medicare A and B    Authorization Time Period  11/23/17 to 01/24/18    PT Start Time  1101    PT Stop Time  1139    PT Time Calculation (min)  38 min    Activity Tolerance  No increased pain;Patient tolerated treatment well    Behavior During Therapy  [facility] for tasks assessed/performed       Past Medical History:  Diagnosis Date  . Bowel obstruction ([place])   . CAP (community acquired pneumonia)   . 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 GERD (gastroesophageal reflux disease)   . Hydrocephalus   . Hyperlipidemia   . Hypertension   . Osteoarthritis, multiple sites 07/08/2016  . 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 Stroke ([place])   . TBI (traumatic brain injury) [facility])     Past Surgical History:  Procedure Laterality Date  . BRAIN SURGERY    . HERNIA REPAIR    . PROSTATECTOMY    . TEE WITHOUT CARDIOVERSION  03/22/2012   Procedure: TRANSESOPHAGEAL ECHOCARDIOGRAM (TEE);  Surgeon: [name], MD;  Location[facility] ENDOSCOPY;  Service: Cardiovascular;  Laterality: N/A;    There were no vitals filed for this visit.  Subjective Assessment - 12/02/17 1109    Subjective  Pt reports that he was not too sore following his last session. His wife would like to change the base on his cane.     Pertinent History  HTN, TBI, stroke    Patient Stated Goals  decrease risk of falling     Currently in Pain?  No/denies                       [facility] Adult PT Treatment/Exercise - 12/02/17  0001      Knee/Hip Exercises: Standing   Hip Abduction  Both;2 sets;10 reps    Abduction Limitations  yellow TB around knees     Other Standing Knee Exercises  heel tap 6" without UE support 2x10 reps each; 3 step commands with variations of Lt/Rt heel tap with CGA and one near LOB x5 trials.     Other Standing Knee Exercises  single stance with contralateral LE propped on 6" box with hip flexion 2x10 reps each      Knee/Hip Exercises: Seated   Hamstring Curl  Both;2 sets;10 reps    Hamstring Limitations  double green TB      Knee/Hip Exercises: Supine   Bridges  Both;2 sets;10 reps    Straight Leg Raises  2 sets;Both;10 reps    Other Supine Knee/Hip Exercises  Lt and Rt ankle PF with double green TB 2x15 reps each             PT Education - 12/02/17 1203    Education Details  technique with therex     Person(s) Educated  Patient    Methods  Explanation;Verbal cues;Demonstration    Comprehension  Returned demonstration;Verbalized understanding  PT Short Term Goals - 11/23/17 1518      PT SHORT TERM GOAL #1   Title  Pt will be independent with his initial HEP to improve LE strength and balance.    Time  4    Period  Weeks    Status  New      PT SHORT TERM GOAL #2   Title  Pt will demo improved gastroc strength, evident by his ability to complete atleast 5 single leg heel raises on each LE.    Time  4    Period  Weeks    Status  New        PT Long Term Goals - 11/23/17 1519      PT LONG TERM GOAL #1   Title  Pt will be independent with advanced HEP to decrease risk of deconditioning and future falls after discharge from PT.     Time  8    Period  Weeks    Status  New      PT LONG TERM GOAL #2   Title  Pt will complete 5x sit to stand in less than 14 sec without UE support, to reflect improvements in functional strength and power.     Time  8    Period  Weeks    Status  New      PT LONG TERM GOAL #3   Title  Pt will be able to complete the TUG in  less than 13 sec, with LRAD, to reflect improvements in his balance and stability with ambulation in the community.     Time  8    Period  Weeks    Status  New      PT LONG TERM GOAL #4   Title  Pt will have atleast 6 point increase in his BERG balance test to reflect a clinically significant improvement in his balance.     Time  8    Period  Weeks    Status  New            Plan - 12/02/17 1213    Clinical Impression Statement  Continued this session with exercise to improve LE strength and dynamic balance. Pt was able to complete LE tap on step but had difficulty with this when cognitive task was combined with this. Ended session with adjustment made to pt's [facility] for improved compliance and use at home. Will continue with current POC moving forward to ensure increase in LE strength, proprioception and safety awareness.     Rehab Potential  Good    PT Frequency  2x / week    PT Duration  8 weeks    PT Treatment/Interventions  ADLs/Self Care Home Management;Moist Heat;Cryotherapy;Therapeutic activities;Functional mobility training;Stair training;Gait training;Therapeutic exercise;Neuromuscular re-education;Balance training;Patient/family education;Manual techniques;Passive range of motion    PT Next Visit Plan  Hip abductor strength, gastroc strength, ankle flexibility; static balance with cognitive task (dual task activity)    PT Home Exercise Plan  Access Code: [email or web address]     Consulted and Agree with Plan of Care  Patient;Family member/caregiver    Family Member Consulted  Spouse       Patient will benefit from skilled therapeutic intervention in order to improve the following deficits and impairments:  Abnormal gait, Decreased activity tolerance, Decreased safety awareness, Decreased strength, Impaired flexibility, Postural dysfunction, Pain, Improper body mechanics, Decreased range of motion, Decreased endurance, Decreased balance, Decreased mobility, Difficulty walking,  Hypomobility  Visit Diagnosis: Unsteadiness on feet  History of  falling  Muscle weakness (generalized)  Difficulty in walking, not elsewhere classified     Problem List Patient Active Problem List   Diagnosis Date Noted  . Dupuytren's contracture 10/30/2017  . Frequent falls 10/30/2017  . History of closed head injury 07/25/2017  . Osteoarthritis, multiple sites 07/08/2016  . Risk for falls 07/08/2016  . Premature atrial contractions 05/11/2015  . Sepsis ([place]) 04/29/2015  . History of small bowel obstruction 04/29/2015  . History of traumatic brain injury 04/29/2015  . Acute respiratory failure ([place]) 04/29/2015  . CAP (community acquired pneumonia)   . Unspecified cerebral artery occlusion with cerebral infarction 08/25/2012  . Aphasia 08/25/2012  . Macular degeneration 04/25/2012  . CVA (cerebral infarction) 03/20/2012  . Small bowel obstruction ([place]) 09/12/2011  . Nausea & vomiting 09/12/2011  . Memory loss 04/01/2010  . 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 SKIN LESION 10/24/2009  . NEOPLASM, SKIN, UNCERTAIN BEHAVIOR 21/22/4825  . DYSGEUSIA 10/10/2009  . Hyperlipidemia 07/20/2008  . DEPRESSION 07/20/2008  . Essential hypertension 07/20/2008  . RHINITIS 07/20/2008  . COLONIC POLYPS, HX OF 07/20/2008  . GERD 07/18/2008  . PROSTATE CANCER, HX OF 07/18/2008    12:19 PM,12/02/17 Sherol Dade PT, DPT [place] [facility] [place] [facility] [address] [place], [place], [address] Phone: [phone]   Fax:  [phone]  Name: Eric George MRN: [id number] Date of Birth: Sep 19, 1930

## 2017-12-07 ENCOUNTER — Encounter: Payer: Self-pay | Admitting: Physical Therapy

## 2017-12-07 ENCOUNTER — Ambulatory Visit: Payer: Medicare Other | Admitting: Physical Therapy

## 2017-12-07 DIAGNOSIS — Z9181 History of falling: Secondary | ICD-10-CM

## 2017-12-07 DIAGNOSIS — R2681 Unsteadiness on feet: Secondary | ICD-10-CM

## 2017-12-07 DIAGNOSIS — R262 Difficulty in walking, not elsewhere classified: Secondary | ICD-10-CM | POA: Diagnosis not present

## 2017-12-07 DIAGNOSIS — M6281 Muscle weakness (generalized): Secondary | ICD-10-CM

## 2017-12-07 NOTE — Therapy (Signed)
[facility] [address], [place], [place], [address] Phone: [phone]   Fax:  [phone]  Physical Therapy Treatment  Patient Details  Name: Eric George MRN: [id number] Date of Birth: [birth year] Referring Provider: Carolann Littler, MD    Encounter Date: 12/07/2017  PT End of Session - 12/07/17 1436    Visit Number  4    Date for PT Re-Evaluation  01/24/18    Authorization Type  Medicare A and B    Authorization Time Period  11/23/17 to 01/24/18    PT Start Time  1400    PT Stop Time  1438    PT Time Calculation (min)  38 min    Activity Tolerance  No increased pain;Patient tolerated treatment well    Behavior During Therapy  [facility] for tasks assessed/performed       Past Medical History:  Diagnosis Date  . Bowel obstruction ([place])   . CAP (community acquired pneumonia)   . 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 GERD (gastroesophageal reflux disease)   . Hydrocephalus   . Hyperlipidemia   . Hypertension   . Osteoarthritis, multiple sites 07/08/2016  . 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 Stroke ([place])   . TBI (traumatic brain injury) [facility])     Past Surgical History:  Procedure Laterality Date  . BRAIN SURGERY    . HERNIA REPAIR    . PROSTATECTOMY    . TEE WITHOUT CARDIOVERSION  03/22/2012   Procedure: TRANSESOPHAGEAL ECHOCARDIOGRAM (TEE);  Surgeon: [name], MD;  Location: [facility] ENDOSCOPY;  Service: Cardiovascular;  Laterality: N/A;    There were no vitals filed for this visit.  Subjective Assessment - 12/07/17 1405    Subjective  Pt reports that things are going well. He was not sore after his last session. He has been completing his HEP regularly.     Pertinent History  HTN, TBI, stroke    Patient Stated Goals  decrease risk of falling     Currently in Pain?  No/denies                       [facility] Adult PT Treatment/Exercise -  12/07/17 0001      Knee/Hip Exercises: Aerobic   Nustep  L3 x4 min, PT present to discuss HEP participation       Knee/Hip Exercises: Seated   Clamshell with TheraBand  Blue 2x15 reps each side     Other Seated Knee/Hip Exercises  heel raises with 5# dumbbells x20 reps     Other Seated Knee/Hip Exercises  trunk rotation with blue TB x15 reps each direction     Abd/Adduction Limitations  adductor ball squeeze x3 sec hold 2x15 reps       Lt and Rt ankle PF with double blue TB 2x15 reps each     Balance Exercises - 12/07/17 1412      Balance Exercises: Standing   Cone Rotation  Solid surface;Right turn;Left turn    Cone Rotation Limitations  seated 2x10 reps each (2nd set on dyna disc)     Other Standing Exercises  Standing on foam trunk rotation and cone reach x10 reps each UE, also completed with NBOS; Pt standing single leg with other LE propped on 8" box with trunk rotation cone stack x10 reps each UE        PT Education - 12/07/17 1435  Education Details  technique with therex     Person(s) Educated  Patient    Methods  Explanation;Verbal cues;Tactile cues    Comprehension  Verbalized understanding;Returned demonstration       PT Short Term Goals - 11/23/17 1518      PT SHORT TERM GOAL #1   Title  Pt will be independent with his initial HEP to improve LE strength and balance.    Time  4    Period  Weeks    Status  New      PT SHORT TERM GOAL #2   Title  Pt will demo improved gastroc strength, evident by his ability to complete atleast 5 single leg heel raises on each LE.    Time  4    Period  Weeks    Status  New        PT Long Term Goals - 11/23/17 1519      PT LONG TERM GOAL #1   Title  Pt will be independent with advanced HEP to decrease risk of deconditioning and future falls after discharge from PT.     Time  8    Period  Weeks    Status  New      PT LONG TERM GOAL #2   Title  Pt will complete 5x sit to stand in less than 14 sec without UE support,  to reflect improvements in functional strength and power.     Time  8    Period  Weeks    Status  New      PT LONG TERM GOAL #3   Title  Pt will be able to complete the TUG in less than 13 sec, with LRAD, to reflect improvements in his balance and stability with ambulation in the community.     Time  8    Period  Weeks    Status  New      PT LONG TERM GOAL #4   Title  Pt will have atleast 6 point increase in his BERG balance test to reflect a clinically significant improvement in his balance.     Time  8    Period  Weeks    Status  New            Plan - 12/07/17 1438    Clinical Impression Statement  Session focused on progressions of LE strengthening therex. Pt was able to complete plantarflexion strengthening with increase up to blue TB without any significant difficulty. Also completed seated and standing cone stacking activity. Pt had difficulty maintaining upright trunk during seated rotation on unstable surface and demonstrated intermittent posterior lean with this. Ended session with noted LE fatigue, but no reports of increased pain. Will continue with current POC.     Rehab Potential  Good    PT Frequency  2x / week    PT Duration  8 weeks    PT Treatment/Interventions  ADLs/Self Care Home Management;Moist Heat;Cryotherapy;Therapeutic activities;Functional mobility training;Stair training;Gait training;Therapeutic exercise;Neuromuscular re-education;Balance training;Patient/family education;Manual techniques;Passive range of motion    PT Next Visit Plan  Hip abductor strength, gastroc strength, ankle flexibility; seated dynamic balance; static balance with cognitive task (dual task activity)    PT Home Exercise Plan  Access Code: [email or web address]     Consulted and Agree with Plan of Care  Patient;Family member/caregiver    Family Member Consulted  Spouse       Patient will benefit from skilled therapeutic intervention in order to improve the following deficits and  impairments:   Abnormal gait, Decreased activity tolerance, Decreased safety awareness, Decreased strength, Impaired flexibility, Postural dysfunction, Pain, Improper body mechanics, Decreased range of motion, Decreased endurance, Decreased balance, Decreased mobility, Difficulty walking, Hypomobility  Visit Diagnosis: Unsteadiness on feet  History of falling  Muscle weakness (generalized)  Difficulty in walking, not elsewhere classified     Problem List Patient Active Problem List   Diagnosis Date Noted  . Dupuytren's contracture 10/30/2017  . Frequent falls 10/30/2017  . History of closed head injury 07/25/2017  . Osteoarthritis, multiple sites 07/08/2016  . Risk for falls 07/08/2016  . Premature atrial contractions 05/11/2015  . Sepsis ([place]) 04/29/2015  . History of small bowel obstruction 04/29/2015  . History of traumatic brain injury 04/29/2015  . Acute respiratory failure ([place]) 04/29/2015  . CAP (community acquired pneumonia)   . Unspecified cerebral artery occlusion with cerebral infarction 08/25/2012  . Aphasia 08/25/2012  . Macular degeneration 04/25/2012  . CVA (cerebral infarction) 03/20/2012  . Small bowel obstruction ([place]) 09/12/2011  . Nausea & vomiting 09/12/2011  . Memory loss 04/01/2010  . 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 SKIN LESION 10/24/2009  . NEOPLASM, SKIN, UNCERTAIN BEHAVIOR 75/88/3254  . DYSGEUSIA 10/10/2009  . Hyperlipidemia 07/20/2008  . DEPRESSION 07/20/2008  . Essential hypertension 07/20/2008  . RHINITIS 07/20/2008  . COLONIC POLYPS, HX OF 07/20/2008  . GERD 07/18/2008  . PROSTATE CANCER, HX OF 07/18/2008    2:43 PM,12/07/17 Sherol Dade PT, DPT [place] [facility] [place] [facility] [address], [place], [place], [address] Phone: [phone]   Fax:  [phone]  Name: SHIMSHON NARULA MRN: [id number] Date of Birth: November 17, 1930

## 2017-12-09 ENCOUNTER — Encounter: Payer: Self-pay | Admitting: Physical Therapy

## 2017-12-09 ENCOUNTER — Ambulatory Visit: Payer: Medicare Other | Admitting: Physical Therapy

## 2017-12-09 DIAGNOSIS — M6281 Muscle weakness (generalized): Secondary | ICD-10-CM | POA: Diagnosis not present

## 2017-12-09 DIAGNOSIS — Z9181 History of falling: Secondary | ICD-10-CM | POA: Diagnosis not present

## 2017-12-09 DIAGNOSIS — R262 Difficulty in walking, not elsewhere classified: Secondary | ICD-10-CM

## 2017-12-09 DIAGNOSIS — R2681 Unsteadiness on feet: Secondary | ICD-10-CM

## 2017-12-09 NOTE — Therapy (Signed)
[facility] [address] [place], [place], [address] Phone: [phone]   Fax:  [phone]  Physical Therapy Treatment  Patient Details  Name: Eric George MRN: [id number] Date of Birth: [birth year] Referring Provider: Carolann Littler, MD    Encounter Date: 12/09/2017  PT End of Session - 12/09/17 1439    Visit Number  5    Date for PT Re-Evaluation  01/24/18    Authorization Type  Medicare A and B    Authorization Time Period  11/23/17 to 01/24/18    PT Start Time  1400    PT Stop Time  1438    PT Time Calculation (min)  38 min    Activity Tolerance  No increased pain;Patient tolerated treatment well    Behavior During Therapy  [facility] for tasks assessed/performed       Past Medical History:  Diagnosis Date  . Bowel obstruction ([place])   . CAP (community acquired pneumonia)   . 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 GERD (gastroesophageal reflux disease)   . Hydrocephalus   . Hyperlipidemia   . Hypertension   . Osteoarthritis, multiple sites 07/08/2016  . 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 Stroke ([place])   . TBI (traumatic brain injury) [facility])     Past Surgical History:  Procedure Laterality Date  . BRAIN SURGERY    . HERNIA REPAIR    . PROSTATECTOMY    . TEE WITHOUT CARDIOVERSION  03/22/2012   Procedure: TRANSESOPHAGEAL ECHOCARDIOGRAM (TEE);  Surgeon: [name], MD;  Location: [facility] ENDOSCOPY;  Service: Cardiovascular;  Laterality: N/A;    There were no vitals filed for this visit.  Subjective Assessment - 12/09/17 1403    Subjective  Pt reports that his exercises are going well. He had to take some medication after last session but was not sore the following day.     Pertinent History  HTN, TBI, stroke    Patient Stated Goals  decrease risk of falling     Currently in Pain?  No/denies                       [facility] Adult PT  Treatment/Exercise - 12/09/17 0001      Knee/Hip Exercises: Aerobic   Nustep  x6 min, PT present to discuss progress and adjust intervals L2 for 1 min, L4 for 30 sec       Knee/Hip Exercises: Seated   Clamshell with TheraBand  Blue   2x15 reps each    Other Seated Knee/Hip Exercises  LAQ with 4# ankle weights x15 reps each     Other Seated Knee/Hip Exercises  BUE reach with yellow weighted ball, forward and lateral directions 2x10 reps     Hamstring Curl  Both;1 set;20 reps    Hamstring Limitations  double red TB      Knee/Hip Exercises: Supine   Bridges  Both;2 sets;10 reps    Straight Leg Raises  2 sets;Both;10 reps          Balance Exercises - 12/09/17 1421      Balance Exercises: Standing   Cone Rotation Limitations  seated 2x10 reps each (on foam pad)    Other Standing Exercises  trampoline forward/backward, Lt/Rt weight shift 2x30 sec each         PT Education - 12/09/17 1439    Education Details  technique with therex  Person(s) Educated  Patient    Methods  Explanation;Tactile cues;Verbal cues    Comprehension  Verbalized understanding;Returned demonstration       PT Short Term Goals - 12/09/17 1442      PT SHORT TERM GOAL #1   Title  Pt will be independent with his initial HEP to improve LE strength and balance.    Time  4    Period  Weeks    Status  Achieved      PT SHORT TERM GOAL #2   Title  Pt will demo improved gastroc strength, evident by his ability to complete atleast 5 single leg heel raises on each LE.    Time  4    Period  Weeks    Status  New        PT Long Term Goals - 11/23/17 1519      PT LONG TERM GOAL #1   Title  Pt will be independent with advanced HEP to decrease risk of deconditioning and future falls after discharge from PT.     Time  8    Period  Weeks    Status  New      PT LONG TERM GOAL #2   Title  Pt will complete 5x sit to stand in less than 14 sec without UE support, to reflect improvements in functional strength  and power.     Time  8    Period  Weeks    Status  New      PT LONG TERM GOAL #3   Title  Pt will be able to complete the TUG in less than 13 sec, with LRAD, to reflect improvements in his balance and stability with ambulation in the community.     Time  8    Period  Weeks    Status  New      PT LONG TERM GOAL #4   Title  Pt will have atleast 6 point increase in his BERG balance test to reflect a clinically significant improvement in his balance.     Time  8    Period  Weeks    Status  New            Plan - 12/09/17 1439    Clinical Impression Statement  Focused on therex to promote LE strength and pt seated balance. Pt was able to complete trunk rotation cone activity without unsteadiness, although he did demonstrate muscle fatigue. Attempted weight shift forward/backward and pt has difficulty with forward weight shift secondary to poor gastroc strength. Therapist monitored pt closely during this. Ended session with update to pts HEP and pt and his spouse verbalized agreement.    Rehab Potential  Good    PT Frequency  2x / week    PT Duration  8 weeks    PT Treatment/Interventions  ADLs/Self Care Home Management;Moist Heat;Cryotherapy;Therapeutic activities;Functional mobility training;Stair training;Gait training;Therapeutic exercise;Neuromuscular re-education;Balance training;Patient/family education;Manual techniques;Passive range of motion    PT Next Visit Plan  Hip abductor strength, gastroc strength, ankle flexibility; seated dynamic balance; static balance with cognitive task (dual task activity)    PT Home Exercise Plan  Access Code: [email or web address]     Consulted and Agree with Plan of Care  Patient;Family member/caregiver    Family Member Consulted  Spouse       Patient will benefit from skilled therapeutic intervention in order to improve the following deficits and impairments:  Abnormal gait, Decreased activity tolerance, Decreased safety awareness, Decreased strength,  Impaired flexibility,  Postural dysfunction, Pain, Improper body mechanics, Decreased range of motion, Decreased endurance, Decreased balance, Decreased mobility, Difficulty walking, Hypomobility  Visit Diagnosis: Unsteadiness on feet  History of falling  Muscle weakness (generalized)  Difficulty in walking, not elsewhere classified     Problem List Patient Active Problem List   Diagnosis Date Noted  . Dupuytren's contracture 10/30/2017  . Frequent falls 10/30/2017  . History of closed head injury 07/25/2017  . Osteoarthritis, multiple sites 07/08/2016  . Risk for falls 07/08/2016  . Premature atrial contractions 05/11/2015  . Sepsis ([place]) 04/29/2015  . History of small bowel obstruction 04/29/2015  . History of traumatic brain injury 04/29/2015  . Acute respiratory failure ([place]) 04/29/2015  . CAP (community acquired pneumonia)   . Unspecified cerebral artery occlusion with cerebral infarction 08/25/2012  . Aphasia 08/25/2012  . Macular degeneration 04/25/2012  . CVA (cerebral infarction) 03/20/2012  . Small bowel obstruction ([place]) 09/12/2011  . Nausea & vomiting 09/12/2011  . Memory loss 04/01/2010  . 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 SKIN LESION 10/24/2009  . NEOPLASM, SKIN, UNCERTAIN BEHAVIOR 25/09/3974  . DYSGEUSIA 10/10/2009  . Hyperlipidemia 07/20/2008  . DEPRESSION 07/20/2008  . Essential hypertension 07/20/2008  . RHINITIS 07/20/2008  . COLONIC POLYPS, HX OF 07/20/2008  . GERD 07/18/2008  . PROSTATE CANCER, HX OF 07/18/2008    2:43 PM,12/09/17 Sherol Dade PT, DPT [place] [facility] [address] [place], [place], [address] Phone: [phone]   Fax:  [phone]  Name: Eric George MRN: [id number] Date of Birth: 05-15-1930

## 2017-12-09 NOTE — Patient Instructions (Signed)
Access Code: [email or web address]  URL: [email or web address]  Date: 12/09/2017  Prepared by: Elly Modena   Exercises  Heel Raise - 20 reps - 1 sets - 3x daily - 7x weekly  Clamshell with Resistance - 10 reps - 3 sets - 2x daily - 7x weekly  Toe Raises with Counter Support - 20 reps - 1 sets - 2x daily - 7x weekly  Supine Bridge - 10 reps - 3 sets - 1x daily - 7x weekly    2:19 PM,12/09/17 Sherol Dade PT, DPT [place] [facility] [place]

## 2017-12-14 ENCOUNTER — Ambulatory Visit: Payer: Medicare Other | Admitting: Physical Therapy

## 2017-12-14 ENCOUNTER — Encounter: Payer: Self-pay | Admitting: Physical Therapy

## 2017-12-14 DIAGNOSIS — Z9181 History of falling: Secondary | ICD-10-CM | POA: Diagnosis not present

## 2017-12-14 DIAGNOSIS — R2681 Unsteadiness on feet: Secondary | ICD-10-CM | POA: Diagnosis not present

## 2017-12-14 DIAGNOSIS — M6281 Muscle weakness (generalized): Secondary | ICD-10-CM

## 2017-12-14 DIAGNOSIS — R262 Difficulty in walking, not elsewhere classified: Secondary | ICD-10-CM | POA: Diagnosis not present

## 2017-12-14 NOTE — Therapy (Signed)
[facility] [address] [place], [place], [address] Phone: [phone]   Fax:  [phone]  Physical Therapy Treatment  Patient Details  Name: Eric George MRN: [id number] Date of Birth: [birth year] Referring Provider: Carolann Littler, MD    Encounter Date: 12/14/2017  PT End of Session - 12/14/17 1442    Visit Number  6    Date for PT Re-Evaluation  01/24/18    Authorization Type  Medicare A and B    Authorization Time Period  11/23/17 to 01/24/18    PT Start Time  1400    PT Stop Time  1440    PT Time Calculation (min)  40 min    Activity Tolerance  No increased pain;Patient tolerated treatment well    Behavior During Therapy  [facility] for tasks assessed/performed       Past Medical History:  Diagnosis Date  . Bowel obstruction ([place])   . CAP (community acquired pneumonia)   . 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 GERD (gastroesophageal reflux disease)   . Hydrocephalus   . Hyperlipidemia   . Hypertension   . Osteoarthritis, multiple sites 07/08/2016  . 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 Stroke ([place])   . TBI (traumatic brain injury) [facility])     Past Surgical History:  Procedure Laterality Date  . BRAIN SURGERY    . HERNIA REPAIR    . PROSTATECTOMY    . TEE WITHOUT CARDIOVERSION  03/22/2012   Procedure: TRANSESOPHAGEAL ECHOCARDIOGRAM (TEE);  Surgeon: [name], MD;  Location: [facility] ENDOSCOPY;  Service: Cardiovascular;  Laterality: N/A;    There were no vitals filed for this visit.  Subjective Assessment - 12/14/17 1404    Subjective  Pt reports that things are going well. He is doing his exercises but is sure he isn't doing them has much as he should.     Pertinent History  HTN, TBI, stroke    Patient Stated Goals  decrease risk of falling     Currently in Pain?  No/denies         [facility] PT Assessment - 12/14/17 0001      Strength   Strength  Assessment Site  Other (comment)   Lt 5 heels raises, Rt 7 heel raises                  [facility] Adult PT Treatment/Exercise - 12/14/17 0001      Knee/Hip Exercises: Aerobic   Nustep  x4 min, L2 PT present to discuss HEP       Knee/Hip Exercises: Standing   Heel Raises  --   Rt x7 reps, Lt x5 reps    Hip Abduction  Both;1 set;10 reps    Abduction Limitations  3#    Other Standing Knee Exercises  hamstring curls 2x10 reps each with 3#     Other Standing Knee Exercises  side stepping Lt/Rt with yellow TB around feet x5 reps each, band around ankle x8 reps each       Knee/Hip Exercises: Seated   Other Seated Knee/Hip Exercises  B ankle planterflexion with blue TB 3x15 reps each           Balance Exercises - 12/14/17 1433      Balance Exercises: Standing   Standing, One Foot on a Step  Eyes open;Foam/compliant surface;6 inch    Stepping Strategy  Anterior;10 reps  Sidestepping  4 reps   x10 steps    Step Over Hurdles / Cones  small and large foam roll x4 trials, 4 steps each with CGA and verbal cues to increase step height        PT Education - 12/14/17 1442    Education Details  technique with therex; updated HEP    Person(s) Educated  Patient    Methods  Explanation;Handout;Demonstration;Tactile cues    Comprehension  Verbalized understanding;Need further instruction       PT Short Term Goals - 12/14/17 1443      PT SHORT TERM GOAL #1   Title  Pt will be independent with his initial HEP to improve LE strength and balance.    Time  4    Period  Weeks    Status  Achieved      PT SHORT TERM GOAL #2   Title  Pt will demo improved gastroc strength, evident by his ability to complete atleast 5 single leg heel raises on each LE.    Baseline  5 on Lt, 7 on Rt     Time  4    Period  Weeks    Status  Achieved        PT Long Term Goals - 11/23/17 1519      PT LONG TERM GOAL #1   Title  Pt will be independent with advanced HEP to decrease risk of  deconditioning and future falls after discharge from PT.     Time  8    Period  Weeks    Status  New      PT LONG TERM GOAL #2   Title  Pt will complete 5x sit to stand in less than 14 sec without UE support, to reflect improvements in functional strength and power.     Time  8    Period  Weeks    Status  New      PT LONG TERM GOAL #3   Title  Pt will be able to complete the TUG in less than 13 sec, with LRAD, to reflect improvements in his balance and stability with ambulation in the community.     Time  8    Period  Weeks    Status  New      PT LONG TERM GOAL #4   Title  Pt will have atleast 6 point increase in his BERG balance test to reflect a clinically significant improvement in his balance.     Time  8    Period  Weeks    Status  New            Plan - 12/14/17 1450    Clinical Impression Statement  Pt is making steady progress, with improvements in gastroc strength noted during attempts to complete single leg heel raises. He was able to complete atleast 5 reps on each side without knee flexion compensation compared to 0 at his evaluation. Pt's exercises were progressed this session and walking activity was included. Pt required CGA and verbal cues when stepping over hurdles, which indicates a risk of falling at home due to poor observation of his surroundings. HEP was updated and there was noted LE fatigue end of today's session.     Rehab Potential  Good    PT Frequency  2x / week    PT Duration  8 weeks    PT Treatment/Interventions  ADLs/Self Care Home Management;Moist Heat;Cryotherapy;Therapeutic activities;Functional mobility training;Stair training;Gait training;Therapeutic exercise;Neuromuscular re-education;Balance training;Patient/family education;Manual  techniques;Passive range of motion    PT Next Visit Plan  Hip abductor strength standing (hip hikes), gastroc strength, ankle flexibility; seated dynamic balance; static balance with cognitive task (dual task  activity)    PT Home Exercise Plan  Access Code: [email or web address]     Consulted and Agree with Plan of Care  Patient;Family member/caregiver    Family Member Consulted  Spouse       Patient will benefit from skilled therapeutic intervention in order to improve the following deficits and impairments:  Abnormal gait, Decreased activity tolerance, Decreased safety awareness, Decreased strength, Impaired flexibility, Postural dysfunction, Pain, Improper body mechanics, Decreased range of motion, Decreased endurance, Decreased balance, Decreased mobility, Difficulty walking, Hypomobility  Visit Diagnosis: Unsteadiness on feet  History of falling  Muscle weakness (generalized)  Difficulty in walking, not elsewhere classified     Problem List Patient Active Problem List   Diagnosis Date Noted  . Dupuytren's contracture 10/30/2017  . Frequent falls 10/30/2017  . History of closed head injury 07/25/2017  . Osteoarthritis, multiple sites 07/08/2016  . Risk for falls 07/08/2016  . Premature atrial contractions 05/11/2015  . Sepsis ([place]) 04/29/2015  . History of small bowel obstruction 04/29/2015  . History of traumatic brain injury 04/29/2015  . Acute respiratory failure ([place]) 04/29/2015  . CAP (community acquired pneumonia)   . Unspecified cerebral artery occlusion with cerebral infarction 08/25/2012  . Aphasia 08/25/2012  . Macular degeneration 04/25/2012  . CVA (cerebral infarction) 03/20/2012  . Small bowel obstruction ([place]) 09/12/2011  . Nausea & vomiting 09/12/2011  . Memory loss 04/01/2010  . 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 SKIN LESION 10/24/2009  . NEOPLASM, SKIN, UNCERTAIN BEHAVIOR 38/46/6599  . DYSGEUSIA 10/10/2009  . Hyperlipidemia 07/20/2008  . DEPRESSION 07/20/2008  . Essential hypertension 07/20/2008  . RHINITIS 07/20/2008  . COLONIC POLYPS, HX OF 07/20/2008  . GERD 07/18/2008  . PROSTATE CANCER, HX OF 07/18/2008     2:53 PM,12/14/17 Sherol Dade PT,  DPT [place] [facility] [address] [place], [place], [address] Phone: [phone]   Fax:  [phone]  Name: Eric George MRN: [id number] Date of Birth: 1930/11/08

## 2017-12-16 ENCOUNTER — Encounter: Payer: Medicare Other | Admitting: Physical Therapy

## 2017-12-17 ENCOUNTER — Encounter: Payer: Medicare Other | Admitting: Physical Therapy

## 2017-12-20 ENCOUNTER — Other Ambulatory Visit: Payer: Self-pay | Admitting: *Deleted

## 2017-12-20 MED ORDER — SERTRALINE HCL 100 MG PO TABS: 100.0000 mg | ORAL_TABLET | Freq: Every day | ORAL | 0 refills | Status: DC

## 2017-12-21 ENCOUNTER — Ambulatory Visit: Payer: Medicare Other | Admitting: Physical Therapy

## 2017-12-21 ENCOUNTER — Encounter: Payer: Self-pay | Admitting: Physical Therapy

## 2017-12-21 DIAGNOSIS — Z9181 History of falling: Secondary | ICD-10-CM | POA: Diagnosis not present

## 2017-12-21 DIAGNOSIS — H60393 Other infective otitis externa, bilateral: Secondary | ICD-10-CM | POA: Diagnosis not present

## 2017-12-21 DIAGNOSIS — R2681 Unsteadiness on feet: Secondary | ICD-10-CM | POA: Diagnosis not present

## 2017-12-21 DIAGNOSIS — R262 Difficulty in walking, not elsewhere classified: Secondary | ICD-10-CM

## 2017-12-21 DIAGNOSIS — M6281 Muscle weakness (generalized): Secondary | ICD-10-CM

## 2017-12-21 DIAGNOSIS — H903 Sensorineural hearing loss, bilateral: Secondary | ICD-10-CM | POA: Diagnosis not present

## 2017-12-21 NOTE — Therapy (Signed)
[facility] [address], [place], [place], [address] Phone: [phone]   Fax:  [phone]  Physical Therapy Treatment  Patient Details  Name: Eric George MRN: [id number] Date of Birth: [birth year] Referring Provider: Carolann Littler, MD    Encounter Date: 12/21/2017  PT End of Session - 12/21/17 1441    Visit Number  7    Date for PT Re-Evaluation  01/24/18    Authorization Type  Medicare A and B    Authorization Time Period  11/23/17 to 01/24/18    PT Start Time  1400    PT Stop Time  1440    PT Time Calculation (min)  40 min    Activity Tolerance  No increased pain;Patient tolerated treatment well    Behavior During Therapy  [facility] for tasks assessed/performed       Past Medical History:  Diagnosis Date  . Bowel obstruction ([place])   . CAP (community acquired pneumonia)   . 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 GERD (gastroesophageal reflux disease)   . Hydrocephalus   . Hyperlipidemia   . Hypertension   . Osteoarthritis, multiple sites 07/08/2016  . 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 Stroke ([place])   . TBI (traumatic brain injury) [facility])     Past Surgical History:  Procedure Laterality Date  . BRAIN SURGERY    . HERNIA REPAIR    . PROSTATECTOMY    . TEE WITHOUT CARDIOVERSION  03/22/2012   Procedure: TRANSESOPHAGEAL ECHOCARDIOGRAM (TEE);  Surgeon: [name], MD;  Location: [facility] ENDOSCOPY;  Service: Cardiovascular;  Laterality: N/A;    There were no vitals filed for this visit.  Subjective Assessment - 12/21/17 1358    Subjective  Pt reports no issues at this time. He was able to do some of his exercises while family was visiting.     Pertinent History  HTN, TBI, stroke    Patient Stated Goals  decrease risk of falling     Currently in Pain?  No/denies                       [facility] Adult PT Treatment/Exercise - 12/21/17 0001      Exercises   Exercises  Knee/Hip      Knee/Hip Exercises: Aerobic   Nustep  x5 min, L3 for LE endurance      Knee/Hip Exercises: Machines for Strengthening   Total Gym Leg Press  Seat 7 BLE #95 x10 reps, single leg Lt and Rt 30# x10 reps each       Knee/Hip Exercises: Seated   Other Seated Knee/Hip Exercises  Lt and Rt ankle circles x20 reps each clockwise and counterclockwise       Knee/Hip Exercises: Supine   Hip Adduction Isometric  Both;Strengthening;2 sets;15 reps    Hip Adduction Isometric Limitations  ball squeeze     Bridges  Both;2 sets;Limitations;15 reps    Bridges Limitations  green TB resistance     Straight Leg Raises  2 sets;Both;10 reps;Strengthening;Limitations    Straight Leg Raises Limitations  2# ankle weight      Knee/Hip Exercises: Sidelying   Hip ABduction  Both;1 set;10 reps    Hip ABduction Limitations  2# ankle weight      Knee/Hip Exercises: Prone   Hamstring Curl  1 set;10 reps    Hamstring Curl Limitations  2# weight, PT cuing  with RLE to decreas hip rotation     Hip Extension  Strengthening;Both;1 set;10 reps    Hip Extension Limitations  2# ankle weights           Balance Exercises - 12/21/17 1434      Balance Exercises: Standing   Tandem Stance  Eyes open;2 reps;30 secs    SLS  Eyes open;Solid surface;2 reps;15 secs   intermittent UE support       PT Education - 12/21/17 1441    Education Details  technique with therex    Person(s) Educated  Patient    Methods  Explanation;Verbal cues;Tactile cues    Comprehension  Verbalized understanding;Returned demonstration       PT Short Term Goals - 12/14/17 1443      PT SHORT TERM GOAL #1   Title  Pt will be independent with his initial HEP to improve LE strength and balance.    Time  4    Period  Weeks    Status  Achieved      PT SHORT TERM GOAL #2   Title  Pt will demo improved gastroc strength, evident by his ability to complete atleast 5 single leg heel raises on each LE.     Baseline  5 on Lt, 7 on Rt     Time  4    Period  Weeks    Status  Achieved        PT Long Term Goals - 11/23/17 1519      PT LONG TERM GOAL #1   Title  Pt will be independent with advanced HEP to decrease risk of deconditioning and future falls after discharge from PT.     Time  8    Period  Weeks    Status  New      PT LONG TERM GOAL #2   Title  Pt will complete 5x sit to stand in less than 14 sec without UE support, to reflect improvements in functional strength and power.     Time  8    Period  Weeks    Status  New      PT LONG TERM GOAL #3   Title  Pt will be able to complete the TUG in less than 13 sec, with LRAD, to reflect improvements in his balance and stability with ambulation in the community.     Time  8    Period  Weeks    Status  New      PT LONG TERM GOAL #4   Title  Pt will have atleast 6 point increase in his BERG balance test to reflect a clinically significant improvement in his balance.     Time  8    Period  Weeks    Status  New            Plan - 12/21/17 1441    Clinical Impression Statement  Continued this session with therex to promote LE strength and endurance. Increased resistance and repetitions with several exercises and pt was able to complete with moderate cuing for proper form. Pt had increased difficulty with balance activity following LE strengthening, secondary to muscle fatigue, however there was no LOB. He would continue to benefit from skilled PT to improve strength, endurance and proprioception.     Rehab Potential  Good    PT Frequency  2x / week    PT Duration  8 weeks    PT Treatment/Interventions  ADLs/Self Care Home Management;Moist Heat;Cryotherapy;Therapeutic activities;Functional  mobility training;Stair training;Gait training;Therapeutic exercise;Neuromuscular re-education;Balance training;Patient/family education;Manual techniques;Passive range of motion    PT Next Visit Plan  Hip abductor strength standing (hip hikes),  gastroc strength, ankle flexibility; seated dynamic balance; static balance with cognitive task (dual task activity)    PT Home Exercise Plan  Access Code: [email or web address]     Consulted and Agree with Plan of Care  Patient;Family member/caregiver    Family Member Consulted  Spouse       Patient will benefit from skilled therapeutic intervention in order to improve the following deficits and impairments:  Abnormal gait, Decreased activity tolerance, Decreased safety awareness, Decreased strength, Impaired flexibility, Postural dysfunction, Pain, Improper body mechanics, Decreased range of motion, Decreased endurance, Decreased balance, Decreased mobility, Difficulty walking, Hypomobility  Visit Diagnosis: Unsteadiness on feet  History of falling  Muscle weakness (generalized)  Difficulty in walking, not elsewhere classified     Problem List Patient Active Problem List   Diagnosis Date Noted  . Dupuytren's contracture 10/30/2017  . Frequent falls 10/30/2017  . History of closed head injury 07/25/2017  . Osteoarthritis, multiple sites 07/08/2016  . Risk for falls 07/08/2016  . Premature atrial contractions 05/11/2015  . Sepsis ([place]) 04/29/2015  . History of small bowel obstruction 04/29/2015  . History of traumatic brain injury 04/29/2015  . Acute respiratory failure ([place]) 04/29/2015  . CAP (community acquired pneumonia)   . Unspecified cerebral artery occlusion with cerebral infarction 08/25/2012  . Aphasia 08/25/2012  . Macular degeneration 04/25/2012  . CVA (cerebral infarction) 03/20/2012  . Small bowel obstruction ([place]) 09/12/2011  . Nausea & vomiting 09/12/2011  . Memory loss 04/01/2010  . 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 SKIN LESION 10/24/2009  . NEOPLASM, SKIN, UNCERTAIN BEHAVIOR 65/78/4696  . DYSGEUSIA 10/10/2009  . Hyperlipidemia 07/20/2008  . DEPRESSION 07/20/2008  . Essential hypertension 07/20/2008  . RHINITIS 07/20/2008  . COLONIC POLYPS, HX OF  07/20/2008  . GERD 07/18/2008  . PROSTATE CANCER, HX OF 07/18/2008     2:44 PM,12/21/17 Sherol Dade PT, DPT [place] [facility] [address], [place], [place], [address] Phone: [phone]   Fax:  [phone]  Name: Eric George MRN: [id number] Date of Birth: February 09, 1931

## 2017-12-23 ENCOUNTER — Ambulatory Visit: Payer: Medicare Other | Admitting: Physical Therapy

## 2017-12-23 ENCOUNTER — Encounter: Payer: Self-pay | Admitting: Physical Therapy

## 2017-12-23 DIAGNOSIS — R2681 Unsteadiness on feet: Secondary | ICD-10-CM | POA: Diagnosis not present

## 2017-12-23 DIAGNOSIS — R262 Difficulty in walking, not elsewhere classified: Secondary | ICD-10-CM

## 2017-12-23 DIAGNOSIS — M6281 Muscle weakness (generalized): Secondary | ICD-10-CM | POA: Diagnosis not present

## 2017-12-23 DIAGNOSIS — Z9181 History of falling: Secondary | ICD-10-CM | POA: Diagnosis not present

## 2017-12-23 NOTE — Therapy (Signed)
[facility] [address], [place], [place], [address] Phone: [phone]   Fax:  [phone]  Physical Therapy Treatment  Patient Details  Name: Eric George MRN: [id number] Date of Birth: [birth year] Referring Provider: Carolann Littler, MD    Encounter Date: 12/23/2017  PT End of Session - 12/23/17 1440    Visit Number  8    Date for PT Re-Evaluation  01/24/18    Authorization Type  Medicare A and B    Authorization Time Period  11/23/17 to 01/24/18    PT Start Time  1400    PT Stop Time  1439    PT Time Calculation (min)  39 min    Activity Tolerance  No increased pain;Patient tolerated treatment well    Behavior During Therapy  [facility] for tasks assessed/performed       Past Medical History:  Diagnosis Date  . Bowel obstruction ([place])   . CAP (community acquired pneumonia)   . 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 GERD (gastroesophageal reflux disease)   . Hydrocephalus   . Hyperlipidemia   . Hypertension   . Osteoarthritis, multiple sites 07/08/2016  . 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 Stroke ([place])   . TBI (traumatic brain injury) [facility])     Past Surgical History:  Procedure Laterality Date  . BRAIN SURGERY    . HERNIA REPAIR    . PROSTATECTOMY    . TEE WITHOUT CARDIOVERSION  03/22/2012   Procedure: TRANSESOPHAGEAL ECHOCARDIOGRAM (TEE);  Surgeon: [name], MD;  Location: [facility] ENDOSCOPY;  Service: Cardiovascular;  Laterality: N/A;    There were no vitals filed for this visit.  Subjective Assessment - 12/23/17 1405    Subjective  Pt reports that he was sore after his last session. No pain currently. His wife notes some times where there is a big difference in his balance and mobility, but others he has some issues with balance.     Pertinent History  HTN, TBI, stroke    Patient Stated Goals  decrease risk of falling     Currently in Pain?   No/denies                       [facility] Adult PT Treatment/Exercise - 12/23/17 0001      Knee/Hip Exercises: Aerobic   Nustep  x6 min L1 increased to L3 for LE/UE endurance       Knee/Hip Exercises: Machines for Strengthening   Total Gym Leg Press  Seat 7 BLE #95 x10 reps, single leg Lt and Rt 30# x10 reps each       Knee/Hip Exercises: Supine   Straight Leg Raises  2 sets;10 reps    Straight Leg Raises Limitations  2# ankle weight    Other Supine Knee/Hip Exercises  Lt and Rt ankle DF with double red TB 2x10 reps       Knee/Hip Exercises: Sidelying   Hip ABduction  Both;1 set;10 reps    Hip ABduction Limitations  2# ankle weight      Knee/Hip Exercises: Prone   Hip Extension  Strengthening;Both;1 set;10 reps    Hip Extension Limitations  2# ankle weights           Balance Exercises - 12/23/17 1414      Balance Exercises: Standing   Standing Eyes Closed  Head turns;Foam/compliant surface;2 reps  Tandem Stance  Eyes open;Eyes closed;2 reps;30 secs    Rockerboard  Anterior/posterior;EO;UE support   x20 reps   Other Standing Exercises  ladder walking reciprocal pattern x4 trials, sidestepping x1 trial each direction        PT Education - 12/23/17 1439    Education Details  technique with therex    Person(s) Educated  Patient    Methods  Explanation;Verbal cues;Tactile cues    Comprehension  Verbalized understanding;Returned demonstration       PT Short Term Goals - 12/14/17 1443      PT SHORT TERM GOAL #1   Title  Pt will be independent with his initial HEP to improve LE strength and balance.    Time  4    Period  Weeks    Status  Achieved      PT SHORT TERM GOAL #2   Title  Pt will demo improved gastroc strength, evident by his ability to complete atleast 5 single leg heel raises on each LE.    Baseline  5 on Lt, 7 on Rt     Time  4    Period  Weeks    Status  Achieved        PT Long Term Goals - 11/23/17 1519      PT LONG TERM GOAL  #1   Title  Pt will be independent with advanced HEP to decrease risk of deconditioning and future falls after discharge from PT.     Time  8    Period  Weeks    Status  New      PT LONG TERM GOAL #2   Title  Pt will complete 5x sit to stand in less than 14 sec without UE support, to reflect improvements in functional strength and power.     Time  8    Period  Weeks    Status  New      PT LONG TERM GOAL #3   Title  Pt will be able to complete the TUG in less than 13 sec, with LRAD, to reflect improvements in his balance and stability with ambulation in the community.     Time  8    Period  Weeks    Status  New      PT LONG TERM GOAL #4   Title  Pt will have atleast 6 point increase in his BERG balance test to reflect a clinically significant improvement in his balance.     Time  8    Period  Weeks    Status  New            Plan - 12/23/17 1440    Clinical Impression Statement  Pt continues to complete his HEP regularly, primarily focusing on heel raises at the countertop. Pt's wife is noticing improvements in his calf strength and overall stability with activity. Session focused on therex to improve LE strength. Added the floor ladder to improve pt's step length, however he required heavy cuing to maintain long steps and demonstrated intermittent unsteadiness when attempting to lengthen his steps. Ended with muscle fatigue but no increase in pain.     Rehab Potential  Good    PT Frequency  2x / week    PT Duration  8 weeks    PT Treatment/Interventions  ADLs/Self Care Home Management;Moist Heat;Cryotherapy;Therapeutic activities;Functional mobility training;Stair training;Gait training;Therapeutic exercise;Neuromuscular re-education;Balance training;Patient/family education;Manual techniques;Passive range of motion    PT Next Visit Plan  Hip abductor strength standing (hip  hikes), gastroc strength bands, ankle flexibility; seated dynamic balance; static balance with cognitive  task (dual task activity)    PT Home Exercise Plan  Access Code: [email or web address]     Consulted and Agree with Plan of Care  Patient;Family member/caregiver    Family Member Consulted  Spouse       Patient will benefit from skilled therapeutic intervention in order to improve the following deficits and impairments:  Abnormal gait, Decreased activity tolerance, Decreased safety awareness, Decreased strength, Impaired flexibility, Postural dysfunction, Pain, Improper body mechanics, Decreased range of motion, Decreased endurance, Decreased balance, Decreased mobility, Difficulty walking, Hypomobility  Visit Diagnosis: Unsteadiness on feet  History of falling  Muscle weakness (generalized)  Difficulty in walking, not elsewhere classified     Problem List Patient Active Problem List   Diagnosis Date Noted  . Dupuytren's contracture 10/30/2017  . Frequent falls 10/30/2017  . History of closed head injury 07/25/2017  . Osteoarthritis, multiple sites 07/08/2016  . Risk for falls 07/08/2016  . Premature atrial contractions 05/11/2015  . Sepsis ([place]) 04/29/2015  . History of small bowel obstruction 04/29/2015  . History of traumatic brain injury 04/29/2015  . Acute respiratory failure ([place]) 04/29/2015  . CAP (community acquired pneumonia)   . Unspecified cerebral artery occlusion with cerebral infarction 08/25/2012  . Aphasia 08/25/2012  . Macular degeneration 04/25/2012  . CVA (cerebral infarction) 03/20/2012  . Small bowel obstruction ([place]) 09/12/2011  . Nausea & vomiting 09/12/2011  . Memory loss 04/01/2010  . 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 SKIN LESION 10/24/2009  . NEOPLASM, SKIN, UNCERTAIN BEHAVIOR 78/67/6720  . DYSGEUSIA 10/10/2009  . Hyperlipidemia 07/20/2008  . DEPRESSION 07/20/2008  . Essential hypertension 07/20/2008  . RHINITIS 07/20/2008  . COLONIC POLYPS, HX OF 07/20/2008  . GERD 07/18/2008  . PROSTATE CANCER, HX OF 07/18/2008    2:45 PM,12/23/17 Sherol Dade PT, DPT [place] [facility] [address], [place], [place], [address] Phone: [phone]   Fax:  [phone]  Name: Eric George MRN: [id number] Date of Birth: 1931-01-27

## 2017-12-28 ENCOUNTER — Ambulatory Visit: Payer: Medicare Other | Admitting: Physical Therapy

## 2017-12-28 ENCOUNTER — Encounter: Payer: Self-pay | Admitting: Physical Therapy

## 2017-12-28 DIAGNOSIS — R262 Difficulty in walking, not elsewhere classified: Secondary | ICD-10-CM

## 2017-12-28 DIAGNOSIS — R2681 Unsteadiness on feet: Secondary | ICD-10-CM

## 2017-12-28 DIAGNOSIS — M6281 Muscle weakness (generalized): Secondary | ICD-10-CM | POA: Diagnosis not present

## 2017-12-28 DIAGNOSIS — Z9181 History of falling: Secondary | ICD-10-CM | POA: Diagnosis not present

## 2017-12-28 NOTE — Therapy (Signed)
[facility] [address], [place], [place], [address] Phone: [phone]   Fax:  [phone]  Physical Therapy Treatment  Patient Details  Name: Eric George MRN: [id number] Date of Birth: 10/30/1930 Referring Provider: Carolann Littler, MD    Encounter Date: 12/28/2017  PT End of Session - 12/28/17 1222    Visit Number  9    Date for PT Re-Evaluation  01/24/18    Authorization Type  Medicare A and B    Authorization Time Period  11/23/17 to 01/24/18    PT Start Time  1145    PT Stop Time  1225    PT Time Calculation (min)  40 min    Activity Tolerance  No increased pain;Patient tolerated treatment well    Behavior During Therapy  [facility] for tasks assessed/performed       Past Medical History:  Diagnosis Date  . Bowel obstruction ([place])   . CAP (community acquired pneumonia)   . 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 GERD (gastroesophageal reflux disease)   . Hydrocephalus   . Hyperlipidemia   . Hypertension   . Osteoarthritis, multiple sites 07/08/2016  . 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 Stroke ([place])   . TBI (traumatic brain injury) [facility])     Past Surgical History:  Procedure Laterality Date  . BRAIN SURGERY    . HERNIA REPAIR    . PROSTATECTOMY    . TEE WITHOUT CARDIOVERSION  03/22/2012   Procedure: TRANSESOPHAGEAL ECHOCARDIOGRAM (TEE);  Surgeon: [name], MD;  Location: [facility] ENDOSCOPY;  Service: Cardiovascular;  Laterality: N/A;    There were no vitals filed for this visit.  Subjective Assessment - 12/28/17 1147    Subjective  Pt reports that things are going well. He has no pain currently.     Pertinent History  HTN, TBI, stroke    Patient Stated Goals  decrease risk of falling     Currently in Pain?  No/denies                       [facility] Adult PT Treatment/Exercise - 12/28/17 0001      Knee/Hip Exercises: Aerobic   Nustep   x5 min, L1 for LE cooldown, PT present to discuss progress       Knee/Hip Exercises: Standing   Heel Raises  Both;2 sets;20 reps   on foam pad      Knee/Hip Exercises: Supine   Bridges  Both;2 sets;Limitations;15 reps    Straight Leg Raises  2 sets;10 reps    Straight Leg Raises Limitations  2# ankle weight    Other Supine Knee/Hip Exercises  Lt and Rt ankle DF 2x15 reps each using double red TB       Manual Therapy   Manual therapy comments  passive Lt and Rt hip flexor stretch in prone x20 sec each           Balance Exercises - 12/28/17 1151      Balance Exercises: Standing   Tandem Stance  Eyes open;Foam/compliant surface;2 reps;30 secs    Standing, One Foot on a Step  Eyes open;6 inch;2 reps    Tandem Gait  Forward;1 rep   10 ft MinA       PT Education - 12/28/17 1200    Education Details  technique with therex    Person(s) Educated  Patient  Methods  Explanation;Verbal cues    Comprehension  Returned demonstration;Verbalized understanding       PT Short Term Goals - 12/28/17 1204      PT SHORT TERM GOAL #1   Title  Pt will be independent with his initial HEP to improve LE strength and balance.    Time  4    Period  Weeks    Status  Achieved      PT SHORT TERM GOAL #2   Title  Pt will demo improved gastroc strength, evident by his ability to complete atleast 5 single leg heel raises on each LE.    Baseline  5 on Lt, 7 on Rt     Time  4    Period  Weeks    Status  Achieved        PT Long Term Goals - 12/28/17 1204      PT LONG TERM GOAL #1   Title  Pt will be independent with advanced HEP to decrease risk of deconditioning and future falls after discharge from PT.     Time  8    Period  Weeks    Status  On-going      PT LONG TERM GOAL #2   Title  Pt will complete 5x sit to stand in less than 14 sec without UE support, to reflect improvements in functional strength and power.     Time  8    Period  Weeks    Status  On-going      PT LONG TERM  GOAL #3   Title  Pt will be able to complete the TUG in less than 13 sec, with LRAD, to reflect improvements in his balance and stability with ambulation in the community.     Time  8    Period  Weeks    Status  On-going      PT LONG TERM GOAL #4   Title  Pt will have atleast 6 point increase in his BERG balance test to reflect a clinically significant improvement in his balance.     Time  8    Period  Weeks    Status  On-going            Plan - 12/28/17 1223    Clinical Impression Statement  Pt continues to report HEP adherence. Focused today on balance activity. Progressing to tandem on unstable surfaces. Pt had increased difficulty with LE prop and eyes closed, requiring MinA to prevent fall from posterior lean. Pt does demonstrate LE fatigue by the end of his sessions, but this appears to be improving with time. HE would continue to benefit from skilled PT to address his limitations in LE strength, endurance and proprioception.     Rehab Potential  Good    PT Frequency  2x / week    PT Duration  8 weeks    PT Treatment/Interventions  ADLs/Self Care Home Management;Moist Heat;Cryotherapy;Therapeutic activities;Functional mobility training;Stair training;Gait training;Therapeutic exercise;Neuromuscular re-education;Balance training;Patient/family education;Manual techniques;Passive range of motion    PT Next Visit Plan  Hip abductor strength standing (hip hikes), gastroc strength bands, ankle flexibility; seated dynamic balance; static balance with cognitive task (dual task activity)    PT Home Exercise Plan  Access Code: [email or web address]     Consulted and Agree with Plan of Care  Patient;Family member/caregiver    Family Member Consulted  Spouse       Patient will benefit from skilled therapeutic intervention in order to improve the following deficits  and impairments:  Abnormal gait, Decreased activity tolerance, Decreased safety awareness, Decreased strength, Impaired flexibility,  Postural dysfunction, Pain, Improper body mechanics, Decreased range of motion, Decreased endurance, Decreased balance, Decreased mobility, Difficulty walking, Hypomobility  Visit Diagnosis: Unsteadiness on feet  History of falling  Muscle weakness (generalized)  Difficulty in walking, not elsewhere classified     Problem List Patient Active Problem List   Diagnosis Date Noted  . Dupuytren's contracture 10/30/2017  . Frequent falls 10/30/2017  . History of closed head injury 07/25/2017  . Osteoarthritis, multiple sites 07/08/2016  . Risk for falls 07/08/2016  . Premature atrial contractions 05/11/2015  . Sepsis ([place]) 04/29/2015  . History of small bowel obstruction 04/29/2015  . History of traumatic brain injury 04/29/2015  . Acute respiratory failure ([place]) 04/29/2015  . CAP (community acquired pneumonia)   . Unspecified cerebral artery occlusion with cerebral infarction 08/25/2012  . Aphasia 08/25/2012  . Macular degeneration 04/25/2012  . CVA (cerebral infarction) 03/20/2012  . Small bowel obstruction ([place]) 09/12/2011  . Nausea & vomiting 09/12/2011  . Memory loss 04/01/2010  . 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 SKIN LESION 10/24/2009  . NEOPLASM, SKIN, UNCERTAIN BEHAVIOR 75/09/1831  . DYSGEUSIA 10/10/2009  . Hyperlipidemia 07/20/2008  . DEPRESSION 07/20/2008  . Essential hypertension 07/20/2008  . RHINITIS 07/20/2008  . COLONIC POLYPS, HX OF 07/20/2008  . GERD 07/18/2008  . PROSTATE CANCER, HX OF 07/18/2008    1:25 PM,12/28/17 Sherol Dade PT, DPT [place] [facility] [address], [place], [place], [address] Phone: [phone]   Fax:  [phone]  Name: Eric George MRN: [id number] Date of Birth: 1931-01-12

## 2017-12-30 ENCOUNTER — Ambulatory Visit: Payer: Medicare Other | Admitting: Physical Therapy

## 2017-12-30 ENCOUNTER — Encounter: Payer: Self-pay | Admitting: Physical Therapy

## 2017-12-30 DIAGNOSIS — R2681 Unsteadiness on feet: Secondary | ICD-10-CM | POA: Diagnosis not present

## 2017-12-30 DIAGNOSIS — Z9181 History of falling: Secondary | ICD-10-CM

## 2017-12-30 DIAGNOSIS — R262 Difficulty in walking, not elsewhere classified: Secondary | ICD-10-CM | POA: Diagnosis not present

## 2017-12-30 DIAGNOSIS — M6281 Muscle weakness (generalized): Secondary | ICD-10-CM

## 2017-12-30 NOTE — Therapy (Signed)
[facility] [address] [place], [place], [address] Phone: [phone]   Fax:  [phone]  Physical Therapy Treatment/progress note  Patient Details  Name: Eric George MRN: [id number] Date of Birth: Mar 22, 1931 Referring Provider: Carolann Littler, MD   Progress Note Reporting Period 11/30/17 to 12/30/17  See note below for Objective Data and Assessment of Progress/Goals.      Encounter Date: 12/30/2017  PT End of Session - 12/30/17 1408    Visit Number  10    Date for PT Re-Evaluation  01/24/18    Authorization Type  Medicare A and B    Authorization Time Period  11/23/17 to 01/24/18    PT Start Time  1400    PT Stop Time  1439    PT Time Calculation (min)  39 min    Activity Tolerance  No increased pain;Patient tolerated treatment well    Behavior During Therapy  [facility] for tasks assessed/performed       Past Medical History:  Diagnosis Date  . Bowel obstruction ([place])   . CAP (community acquired pneumonia)   . 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 GERD (gastroesophageal reflux disease)   . Hydrocephalus   . Hyperlipidemia   . Hypertension   . Osteoarthritis, multiple sites 07/08/2016  . 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 Stroke ([place])   . TBI (traumatic brain injury) [facility])     Past Surgical History:  Procedure Laterality Date  . BRAIN SURGERY    . HERNIA REPAIR    . PROSTATECTOMY    . TEE WITHOUT CARDIOVERSION  03/22/2012   Procedure: TRANSESOPHAGEAL ECHOCARDIOGRAM (TEE);  Surgeon: [name], MD;  Location: [facility] ENDOSCOPY;  Service: Cardiovascular;  Laterality: N/A;    There were no vitals filed for this visit.  Subjective Assessment - 12/30/17 1406    Subjective  Pt reports that his hands are hurting him in the mornings and is not sure what to do with that. He tried to fix the light and stepping in the chair. He lost his balance with this and  scraped his knee.     Pertinent History  HTN, TBI, stroke    Patient Stated Goals  decrease risk of falling     Currently in Pain?  No/denies         [facility] PT Assessment - 12/30/17 0001      Transfers   Five time sit to stand comments   16 sec, minimal use of UE without crashing into chair       Berg Balance Test   Sit to Stand  Able to stand  independently using hands    Standing Unsupported  Able to stand safely 2 minutes    Sitting with Back Unsupported but Feet Supported on Floor or Stool  Able to sit safely and securely 2 minutes    Stand to Sit  Controls descent by using hands    Transfers  Able to transfer safely, definite need of hands    Standing Unsupported with Eyes Closed  Able to stand 10 seconds safely    Standing Ubsupported with Feet Together  Able to place feet together independently and stand 1 minute safely    From Standing, Reach Forward with Outstretched Arm  Can reach forward >12 cm safely (5")    From Standing Position, Pick up Object from Floor  Able to pick up shoe safely  and easily    From Standing Position, Turn to Look Behind Over each Shoulder  Turn sideways only but maintains balance    Turn 360 Degrees  Able to turn 360 degrees safely but slowly    Standing Unsupported, Alternately Place Feet on Step/Stool  Able to complete >2 steps/needs minimal assist    Standing Unsupported, One Foot in Front  Able to plae foot ahead of the other independently and hold 30 seconds   Lt forward x30 sec, Rt forward x11 sec    Standing on One Leg  Tries to lift leg/unable to hold 3 seconds but remains standing independently    Total Score  41                   [facility] Adult PT Treatment/Exercise - 12/30/17 0001      Knee/Hip Exercises: Aerobic   Nustep  x5 min L2, PT present to discuss safety awareness.       Knee/Hip Exercises: Seated   Other Seated Knee/Hip Exercises  B heel raise with 8# dumbbells x20 reps       Knee/Hip Exercises: Sidelying   Hip  ABduction  Right;Left;2 sets;10 reps    Hip ABduction Limitations  3# ankle weights       Knee/Hip Exercises: Prone   Hip Extension  Right;Left;2 sets;10 reps    Hip Extension Limitations  3# ankle weights              PT Education - 12/30/17 1439    Education Details  technique with therex; improvements in [place]; safety awareness at home.     Person(s) Educated  Patient;Spouse    Methods  Explanation;Handout    Comprehension  Verbalized understanding;Returned demonstration       PT Short Term Goals - 12/28/17 1204      PT SHORT TERM GOAL #1   Title  Pt will be independent with his initial HEP to improve LE strength and balance.    Time  4    Period  Weeks    Status  Achieved      PT SHORT TERM GOAL #2   Title  Pt will demo improved gastroc strength, evident by his ability to complete atleast 5 single leg heel raises on each LE.    Baseline  5 on Lt, 7 on Rt     Time  4    Period  Weeks    Status  Achieved        PT Long Term Goals - 12/30/17 1433      PT LONG TERM GOAL #1   Title  Pt will be independent with advanced HEP to decrease risk of deconditioning and future falls after discharge from PT.     Time  8    Period  Weeks    Status  On-going      PT LONG TERM GOAL #2   Title  Pt will complete 5x sit to stand in less than 14 sec without UE support, to reflect improvements in functional strength and power.     Baseline  16 sec, UE support intermittent, no crashing into chair    Time  8    Period  Weeks    Status  Partially Met      PT LONG TERM GOAL #3   Title  Pt will be able to complete the TUG in less than 13 sec, with LRAD, to reflect improvements in his balance and stability with ambulation in the community.  Time  8    Period  Weeks    Status  On-going      PT LONG TERM GOAL #4   Title  Pt will have atleast 6 point increase in his BERG balance test to reflect a clinically significant improvement in his balance.     Baseline  32/56 increased to  41/56    Time  8    Period  Weeks    Status  On-going            Plan - 12/30/17 1456    Clinical Impression Statement  Pt is making steady progress towards his goals. His wife has noticed an improvement in his steadiness throughout the day, although she does note occasional unsteadiness when not paying attention to his surroundings. Pt's sit to stand time remains the same, but he demonstrates improved eccentric control without crashing into the chair. His Berg Balance score is also 9 points improved up to 41/56 which is a significant improvement from his initial evaluation. He does have issues with safety awareness and has remaining limitations in LE strength and proprioception. He would benefit continued skilled PT to address his remaining limitations listed above.     Rehab Potential  Good    PT Frequency  2x / week    PT Duration  8 weeks    PT Treatment/Interventions  ADLs/Self Care Home Management;Moist Heat;Cryotherapy;Therapeutic activities;Functional mobility training;Stair training;Gait training;Therapeutic exercise;Neuromuscular re-education;Balance training;Patient/family education;Manual techniques;Passive range of motion    PT Next Visit Plan  Hip abductor strength standing (hip hikes), gastroc strength bands, ankle flexibility; seated dynamic balance; static balance with cognitive task (dual task activity)    PT Home Exercise Plan  Access Code: [email or web address]     Consulted and Agree with Plan of Care  Patient;Family member/caregiver    Family Member Consulted  Spouse       Patient will benefit from skilled therapeutic intervention in order to improve the following deficits and impairments:  Abnormal gait, Decreased activity tolerance, Decreased safety awareness, Decreased strength, Impaired flexibility, Postural dysfunction, Pain, Improper body mechanics, Decreased range of motion, Decreased endurance, Decreased balance, Decreased mobility, Difficulty walking, Hypomobility  Visit  Diagnosis: Unsteadiness on feet  History of falling  Muscle weakness (generalized)  Difficulty in walking, not elsewhere classified     Problem List Patient Active Problem List   Diagnosis Date Noted  . Dupuytren's contracture 10/30/2017  . Frequent falls 10/30/2017  . History of closed head injury 07/25/2017  . Osteoarthritis, multiple sites 07/08/2016  . Risk for falls 07/08/2016  . Premature atrial contractions 05/11/2015  . Sepsis ([place]) 04/29/2015  . History of small bowel obstruction 04/29/2015  . History of traumatic brain injury 04/29/2015  . Acute respiratory failure ([place]) 04/29/2015  . CAP (community acquired pneumonia)   . Unspecified cerebral artery occlusion with cerebral infarction 08/25/2012  . Aphasia 08/25/2012  . Macular degeneration 04/25/2012  . CVA (cerebral infarction) 03/20/2012  . Small bowel obstruction ([place]) 09/12/2011  . Nausea & vomiting 09/12/2011  . Memory loss 04/01/2010  . 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 SKIN LESION 10/24/2009  . NEOPLASM, SKIN, UNCERTAIN BEHAVIOR 63/87/5643  . DYSGEUSIA 10/10/2009  . Hyperlipidemia 07/20/2008  . DEPRESSION 07/20/2008  . Essential hypertension 07/20/2008  . RHINITIS 07/20/2008  . COLONIC POLYPS, HX OF 07/20/2008  . GERD 07/18/2008  . PROSTATE CANCER, HX OF 07/18/2008    3:43 PM,12/30/17 Sherol Dade PT, DPT [place] [facility] [place]  [facility] [address] [place], [place], [address] Phone: [phone]   Fax:  [phone]  Name: Eric George MRN: [id number] Date of Birth: February 09, 1931

## 2018-01-05 ENCOUNTER — Ambulatory Visit: Payer: Medicare Other | Attending: Family Medicine | Admitting: Physical Therapy

## 2018-01-05 DIAGNOSIS — M6281 Muscle weakness (generalized): Secondary | ICD-10-CM | POA: Insufficient documentation

## 2018-01-05 DIAGNOSIS — R262 Difficulty in walking, not elsewhere classified: Secondary | ICD-10-CM | POA: Diagnosis not present

## 2018-01-05 DIAGNOSIS — R2681 Unsteadiness on feet: Secondary | ICD-10-CM | POA: Diagnosis not present

## 2018-01-05 DIAGNOSIS — Z9181 History of falling: Secondary | ICD-10-CM

## 2018-01-05 NOTE — Therapy (Signed)
[facility] [address] [place], [place], [address] Phone: [phone]   Fax:  [phone]  Physical Therapy Treatment  Patient Details  Name: Eric Eric MRN: [id number] Date of Birth: 1930-07-26 Referring Provider: Carolann Littler, MD    Encounter Date: 01/05/2018  PT End of Session - 01/05/18 1154    Visit Number  11    Date for PT Re-Evaluation  01/24/18    Authorization Type  Medicare A and B    Authorization Time Period  11/23/17 to 01/24/18    PT Start Time  1145    PT Stop Time  1228    PT Time Calculation (min)  43 min    Activity Tolerance  No increased pain;Patient tolerated treatment well    Behavior During Therapy  [facility] for tasks assessed/performed       Past Medical History:  Diagnosis Date  . Bowel obstruction ([place])   . CAP (community acquired pneumonia)   . 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 GERD (gastroesophageal reflux disease)   . Hydrocephalus   . Hyperlipidemia   . Hypertension   . Osteoarthritis, multiple sites 07/08/2016  . 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 Stroke ([place])   . TBI (traumatic brain injury) [facility])     Past Surgical History:  Procedure Laterality Date  . BRAIN SURGERY    . HERNIA REPAIR    . PROSTATECTOMY    . TEE WITHOUT CARDIOVERSION  03/22/2012   Procedure: TRANSESOPHAGEAL ECHOCARDIOGRAM (TEE);  Surgeon: [name], MD;  Location: [facility] ENDOSCOPY;  Service: Cardiovascular;  Laterality: N/A;    There were no vitals filed for this visit.  Subjective Assessment - 01/05/18 1234    Subjective  Pt has no news to report.  At end of session wife reports he "slid" out of bed onto the floor last night    Patient Stated Goals  decrease risk of falling     Currently in Pain?  No/denies                       [facility] Adult PT Treatment/Exercise - 01/05/18 0001      Neuro Re-ed    Neuro Re-ed Details    standing on foam mat side to side swaysing      Exercises   Exercises  Knee/Hip      Knee/Hip Exercises: Stretches   Active Hamstring Stretch  Right;Left;2 reps;30 seconds      Knee/Hip Exercises: Aerobic   Nustep  x6 min L2, PT present to discuss safety awareness.       Knee/Hip Exercises: Standing   Heel Raises  Both;20 reps   on foam mat at bar on TM; CGA   Hip Abduction  Both;1 set;10 reps    Abduction Limitations  2#    Other Standing Knee Exercises  side stepping over half foam roll and back, needs UE support      Knee/Hip Exercises: Supine   Bridges  Both;2 sets;Limitations;15 reps    Bridges Limitations  green TB resistance     Straight Leg Raises  2 sets;10 reps    Straight Leg Raises Limitations  3# ankle weight    Other Supine Knee/Hip Exercises  clam - green tband - 20x    Other Supine Knee/Hip Exercises  Lt and Rt ankle DF - green tband - 2x 15  Knee/Hip Exercises: Sidelying   Hip ABduction  Right;Left;2 sets;10 reps    Hip ABduction Limitations  3# ankle weights                PT Short Term Goals - 12/28/17 1204      PT SHORT TERM GOAL #1   Title  Pt will be independent with his initial HEP to improve LE strength and balance.    Time  4    Period  Weeks    Status  Achieved      PT SHORT TERM GOAL #2   Title  Pt will demo improved gastroc strength, evident by his ability to complete atleast 5 single leg heel raises on each LE.    Baseline  5 on Lt, 7 on Rt     Time  4    Period  Weeks    Status  Achieved        PT Long Term Goals - 12/30/17 1433      PT LONG TERM GOAL #1   Title  Pt will be independent with advanced HEP to decrease risk of deconditioning and future falls after discharge from PT.     Time  8    Period  Weeks    Status  On-going      PT LONG TERM GOAL #2   Title  Pt will complete 5x sit to stand in less than 14 sec without UE support, to reflect improvements in functional strength and power.     Baseline  16 sec, UE  support intermittent, no crashing into chair    Time  8    Period  Weeks    Status  Partially Met      PT LONG TERM GOAL #3   Title  Pt will be able to complete the TUG in less than 13 sec, with LRAD, to reflect improvements in his balance and stability with ambulation in the community.     Time  8    Period  Weeks    Status  On-going      PT LONG TERM GOAL #4   Title  Pt will have atleast 6 point increase in his BERG balance test to reflect a clinically significant improvement in his balance.     Baseline  32/56 increased to 41/56    Time  8    Period  Weeks    Status  On-going            Plan - 01/05/18 1224    Clinical Impression Statement  Pt was able to increase resistance on some exercises.  He was challenged with unsteady surface and needs close supervision and CGA at times.  pt needed cues to do slow and controlled movemnts.  He will benefit from skilled PT to continue working towards functional goals.    PT Treatment/Interventions  ADLs/Self Care Home Management;Moist Heat;Cryotherapy;Therapeutic activities;Functional mobility training;Stair training;Gait training;Therapeutic exercise;Neuromuscular re-education;Balance training;Patient/family education;Manual techniques;Passive range of motion    PT Next Visit Plan  Hip abductor strength standing (hip hikes), gastroc strength bands, ankle flexibility; seated dynamic balance; static balance with cognitive task (dual task activity)    PT Home Exercise Plan  Access Code: [email or web address]     Consulted and Agree with Plan of Care  Patient;Family member/caregiver       Patient will benefit from skilled therapeutic intervention in order to improve the following deficits and impairments:  Abnormal gait, Decreased activity tolerance, Decreased safety awareness, Decreased strength, Impaired flexibility, Postural  dysfunction, Pain, Improper body mechanics, Decreased range of motion, Decreased endurance, Decreased balance, Decreased mobility,  Difficulty walking, Hypomobility  Visit Diagnosis: Unsteadiness on feet  History of falling  Muscle weakness (generalized)  Difficulty in walking, not elsewhere classified     Problem List Patient Active Problem List   Diagnosis Date Noted  . Dupuytren's contracture 10/30/2017  . Frequent falls 10/30/2017  . History of closed head injury 07/25/2017  . Osteoarthritis, multiple sites 07/08/2016  . Risk for falls 07/08/2016  . Premature atrial contractions 05/11/2015  . Sepsis ([place]) 04/29/2015  . History of small bowel obstruction 04/29/2015  . History of traumatic brain injury 04/29/2015  . Acute respiratory failure ([place]) 04/29/2015  . CAP (community acquired pneumonia)   . Unspecified cerebral artery occlusion with cerebral infarction 08/25/2012  . Aphasia 08/25/2012  . Macular degeneration 04/25/2012  . CVA (cerebral infarction) 03/20/2012  . Small bowel obstruction ([place]) 09/12/2011  . Nausea & vomiting 09/12/2011  . Memory loss 04/01/2010  . 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 SKIN LESION 10/24/2009  . NEOPLASM, SKIN, UNCERTAIN BEHAVIOR 05/14/347  . DYSGEUSIA 10/10/2009  . Hyperlipidemia 07/20/2008  . DEPRESSION 07/20/2008  . Essential hypertension 07/20/2008  . RHINITIS 07/20/2008  . COLONIC POLYPS, HX OF 07/20/2008  . GERD 07/18/2008  . PROSTATE CANCER, HX OF 07/18/2008    Wayne Both 01/05/2018, 12:34 PM  [facility] [address], [place], [place], [address] Phone: [phone]   Fax:  [phone]  Name: Eric Eric MRN: [id number] Date of Birth: 1930-12-01

## 2018-01-06 ENCOUNTER — Other Ambulatory Visit: Payer: Self-pay | Admitting: *Deleted

## 2018-01-06 MED ORDER — IRBESARTAN 150 MG PO TABS: 150.0000 mg | ORAL_TABLET | Freq: Every day | ORAL | 1 refills | Status: DC

## 2018-01-11 ENCOUNTER — Encounter: Payer: Self-pay | Admitting: Physical Therapy

## 2018-01-11 ENCOUNTER — Ambulatory Visit: Payer: Medicare Other | Admitting: Physical Therapy

## 2018-01-11 DIAGNOSIS — R2681 Unsteadiness on feet: Secondary | ICD-10-CM | POA: Diagnosis not present

## 2018-01-11 DIAGNOSIS — Z9181 History of falling: Secondary | ICD-10-CM

## 2018-01-11 DIAGNOSIS — R262 Difficulty in walking, not elsewhere classified: Secondary | ICD-10-CM | POA: Diagnosis not present

## 2018-01-11 DIAGNOSIS — M6281 Muscle weakness (generalized): Secondary | ICD-10-CM | POA: Diagnosis not present

## 2018-01-11 NOTE — Therapy (Signed)
[facility] [address] [place], [place], [address] Phone: [phone]   Fax:  [phone]  Physical Therapy Treatment  Patient Details  Name: Eric George MRN: [id number] Date of Birth: [birth year] Referring Provider: Carolann Littler, MD    Encounter Date: 01/11/2018  PT End of Session - 01/11/18 1149    Visit Number  12    Date for PT Re-Evaluation  01/24/18    Authorization Type  Medicare A and B    Authorization Time Period  11/23/17 to 01/24/18    PT Start Time  1145    PT Stop Time  1225    PT Time Calculation (min)  40 min    Activity Tolerance  No increased pain;Patient tolerated treatment well    Behavior During Therapy  [facility] for tasks assessed/performed       Past Medical History:  Diagnosis Date  . Bowel obstruction ([place])   . CAP (community acquired pneumonia)   . 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 GERD (gastroesophageal reflux disease)   . Hydrocephalus   . Hyperlipidemia   . Hypertension   . Osteoarthritis, multiple sites 07/08/2016  . 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 Stroke ([place])   . TBI (traumatic brain injury) [facility])     Past Surgical History:  Procedure Laterality Date  . BRAIN SURGERY    . HERNIA REPAIR    . PROSTATECTOMY    . TEE WITHOUT CARDIOVERSION  03/22/2012   Procedure: TRANSESOPHAGEAL ECHOCARDIOGRAM (TEE);  Surgeon: [name], MD;  Location: [facility] ENDOSCOPY;  Service: Cardiovascular;  Laterality: N/A;    There were no vitals filed for this visit.  Subjective Assessment - 01/11/18 1228    Subjective  Reports he feels a little neck stiffness today.    Patient is accompained by:  Family member    Patient Stated Goals  decrease risk of falling     Currently in Pain?  No/denies                       [facility] Adult PT Treatment/Exercise - 01/11/18 0001      Exercises   Exercises  Knee/Hip      Knee/Hip  Exercises: Aerobic   Nustep  x8 min L2, PT present to moniter      Knee/Hip Exercises: Standing   Other Standing Knee Exercises  stepping up on foam mat and back down; step up on 6" 10x each way      Knee/Hip Exercises: Seated   Clamshell with TheraBand  Blue   2x15   Other Seated Knee/Hip Exercises  B heel raise with 8# dumbbells x20 reps     Other Seated Knee/Hip Exercises  sitting on BOSU marching, LAQ, UE flexion3lb and LE march      Knee/Hip Exercises: Supine   Other Supine Knee/Hip Exercises  alternating UE/LE - 20x 3lb weights UE/LE               PT Short Term Goals - 12/28/17 1204      PT SHORT TERM GOAL #1   Title  Pt will be independent with his initial HEP to improve LE strength and balance.    Time  4    Period  Weeks    Status  Achieved      PT SHORT TERM GOAL #2   Title  Pt will demo improved gastroc  strength, evident by his ability to complete atleast 5 single leg heel raises on each LE.    Baseline  5 on Lt, 7 on Rt     Time  4    Period  Weeks    Status  Achieved        PT Long Term Goals - 12/30/17 1433      PT LONG TERM GOAL #1   Title  Pt will be independent with advanced HEP to decrease risk of deconditioning and future falls after discharge from PT.     Time  8    Period  Weeks    Status  On-going      PT LONG TERM GOAL #2   Title  Pt will complete 5x sit to stand in less than 14 sec without UE support, to reflect improvements in functional strength and power.     Baseline  16 sec, UE support intermittent, no crashing into chair    Time  8    Period  Weeks    Status  Partially Met      PT LONG TERM GOAL #3   Title  Pt will be able to complete the TUG in less than 13 sec, with LRAD, to reflect improvements in his balance and stability with ambulation in the community.     Time  8    Period  Weeks    Status  On-going      PT LONG TERM GOAL #4   Title  Pt will have atleast 6 point increase in his BERG balance test to reflect a  clinically significant improvement in his balance.     Baseline  32/56 increased to 41/56    Time  8    Period  Weeks    Status  On-going            Plan - 01/11/18 1226    Clinical Impression Statement  Pt performed well needing cues for exercisers incoorporating UE and LE.  He demonstrates improved alternating movements after several attempts and tactile cues.  Pt will benefit from skilled PT to continue working on strength and balance.    PT Treatment/Interventions  ADLs/Self Care Home Management;Moist Heat;Cryotherapy;Therapeutic activities;Functional mobility training;Stair training;Gait training;Therapeutic exercise;Neuromuscular re-education;Balance training;Patient/family education;Manual techniques;Passive range of motion    PT Next Visit Plan  Hip abductor strength standing (hip hikes), gastroc strength bands, ankle flexibility; seated dynamic balance; static balance with cognitive task (dual task activity)    PT Home Exercise Plan  Access Code: [email or web address]     Consulted and Agree with Plan of Care  Patient;Family member/caregiver    Family Member Consulted  Spouse       Patient will benefit from skilled therapeutic intervention in order to improve the following deficits and impairments:  Abnormal gait, Decreased activity tolerance, Decreased safety awareness, Decreased strength, Impaired flexibility, Postural dysfunction, Pain, Improper body mechanics, Decreased range of motion, Decreased endurance, Decreased balance, Decreased mobility, Difficulty walking, Hypomobility  Visit Diagnosis: Unsteadiness on feet  History of falling  Muscle weakness (generalized)  Difficulty in walking, not elsewhere classified     Problem List Patient Active Problem List   Diagnosis Date Noted  . Dupuytren's contracture 10/30/2017  . Frequent falls 10/30/2017  . History of closed head injury 07/25/2017  . Osteoarthritis, multiple sites 07/08/2016  . Risk for falls 07/08/2016  .  Premature atrial contractions 05/11/2015  . Sepsis ([place]) 04/29/2015  . History of small bowel obstruction 04/29/2015  . History of traumatic brain  injury 04/29/2015  . Acute respiratory failure ([place]) 04/29/2015  . CAP (community acquired pneumonia)   . Unspecified cerebral artery occlusion with cerebral infarction 08/25/2012  . Aphasia 08/25/2012  . Macular degeneration 04/25/2012  . CVA (cerebral infarction) 03/20/2012  . Small bowel obstruction ([place]) 09/12/2011  . Nausea & vomiting 09/12/2011  . Memory loss 04/01/2010  . 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 SKIN LESION 10/24/2009  . NEOPLASM, SKIN, UNCERTAIN BEHAVIOR 90/47/5339  . DYSGEUSIA 10/10/2009  . Hyperlipidemia 07/20/2008  . DEPRESSION 07/20/2008  . Essential hypertension 07/20/2008  . RHINITIS 07/20/2008  . COLONIC POLYPS, HX OF 07/20/2008  . GERD 07/18/2008  . PROSTATE CANCER, HX OF 07/18/2008    Zannie Cove, PT 01/11/2018, 12:30 PM  [facility] [address] [place], [place], [address] Phone: [phone]   Fax:  [phone]  Name: PRATHER FAILLA MRN: [id number] Date of Birth: [birth year]

## 2018-01-13 ENCOUNTER — Ambulatory Visit: Payer: Medicare Other | Admitting: Physical Therapy

## 2018-01-13 ENCOUNTER — Encounter: Payer: Self-pay | Admitting: Physical Therapy

## 2018-01-13 DIAGNOSIS — M6281 Muscle weakness (generalized): Secondary | ICD-10-CM | POA: Diagnosis not present

## 2018-01-13 DIAGNOSIS — R2681 Unsteadiness on feet: Secondary | ICD-10-CM

## 2018-01-13 DIAGNOSIS — Z9181 History of falling: Secondary | ICD-10-CM | POA: Diagnosis not present

## 2018-01-13 DIAGNOSIS — R262 Difficulty in walking, not elsewhere classified: Secondary | ICD-10-CM

## 2018-01-13 NOTE — Therapy (Signed)
[facility] [address], [place], [place], [address] Phone: [phone]   Fax:  [phone]  Physical Therapy Treatment  Patient Details  Name: Eric George MRN: [id number] Date of Birth: May 03, 1931 Referring Provider: Carolann Littler, MD    Encounter Date: 01/13/2018  PT End of Session - 01/13/18 1404    Visit Number  13    Date for PT Re-Evaluation  01/24/18    Authorization Type  Medicare A and B    Authorization Time Period  11/23/17 to 01/24/18    PT Start Time  1401    PT Stop Time  1440    PT Time Calculation (min)  39 min    Activity Tolerance  No increased pain;Patient tolerated treatment well    Behavior During Therapy  [facility] for tasks assessed/performed       Past Medical History:  Diagnosis Date  . Bowel obstruction ([place])   . CAP (community acquired pneumonia)   . 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 GERD (gastroesophageal reflux disease)   . Hydrocephalus   . Hyperlipidemia   . Hypertension   . Osteoarthritis, multiple sites 07/08/2016  . 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 Stroke ([place])   . TBI (traumatic brain injury) [facility])     Past Surgical History:  Procedure Laterality Date  . BRAIN SURGERY    . HERNIA REPAIR    . PROSTATECTOMY    . TEE WITHOUT CARDIOVERSION  03/22/2012   Procedure: TRANSESOPHAGEAL ECHOCARDIOGRAM (TEE);  Surgeon: [name], MD;  Location: [facility] ENDOSCOPY;  Service: Cardiovascular;  Laterality: N/A;    There were no vitals filed for this visit.  Subjective Assessment - 01/13/18 1445    Subjective  No complaints or new reports    Patient is accompained by:  Family member    Pertinent History  HTN, TBI, stroke    Patient Stated Goals  decrease risk of falling     Currently in Pain?  No/denies                       [facility] Adult PT Treatment/Exercise - 01/13/18 0001      Transfers   Five time sit  to stand comments   28 sec with fatigue and needing UE for the last 2 reps; 21 sec with UE support for all 5 reps      Standardized Balance Assessment   Standardized Balance Assessment  Timed Up and Go Test      Timed Up and Go Test   TUG  Normal TUG    Normal TUG (seconds)  --   20, 14, 12     Exercises   Exercises  Knee/Hip      Knee/Hip Exercises: Aerobic   Nustep  x8 min L2, PT present to [place]      Knee/Hip Exercises: Standing   Hip Abduction  Both;1 set;10 reps    Abduction Limitations  2#    Forward Step Up  Right;Left;10 reps;Step Height: 6";Hand Hold: 2      Knee/Hip Exercises: Seated   Long Arc Quad  Strengthening;Right;Left;15 reps;Weights    Long Arc Quad Weight  --   2.5   Clamshell with [employer]  --   2x15 - black   Marching  Strengthening;Right;Left;20 reps;Weights    Marching Limitations  UE holding 2 lb and LE 2.5lb weight - alt UE/LE  Sit to Sand  2 sets;5 reps;with UE support      Knee/Hip Exercises: Supine   Short Arc Quad Sets  Strengthening;Right;Left;20 reps   5lb ankle weights x 2 sets   Constance Haw  Both;20 reps               PT Short Term Goals - 12/28/17 1204      PT SHORT TERM GOAL #1   Title  Pt will be independent with his initial HEP to improve LE strength and balance.    Time  4    Period  Weeks    Status  Achieved      PT SHORT TERM GOAL #2   Title  Pt will demo improved gastroc strength, evident by his ability to complete atleast 5 single leg heel raises on each LE.    Baseline  5 on Lt, 7 on Rt     Time  4    Period  Weeks    Status  Achieved        PT Long Term Goals - 01/13/18 1414      PT LONG TERM GOAL #1   Title  Pt will be independent with advanced HEP to decrease risk of deconditioning and future falls after discharge from PT.     Status  On-going      PT LONG TERM GOAL #2   Title  Pt will complete 5x sit to stand in less than 14 sec without UE support, to reflect improvements in functional strength and  power.     Status  On-going      PT LONG TERM GOAL #3   Title  Pt will be able to complete the TUG in less than 13 sec, with LRAD, to reflect improvements in his balance and stability with ambulation in the community.     Baseline  several trials with one at 12 seconds, others 13, 14 and 20 sec    Status  On-going            Plan - 01/13/18 1442    Clinical Impression Statement  Pt did well with exercises today.  He continues to need visual and tactile cues during exercises.  Pt was able to perfrom 12 sec TUG, but on average still >13 sec.  Pt is able to perform sit to stand without UE support a few times with controlled descent but gets fatigued after three and need to use his UE.  Pt will benefit from skilled PT to porgress towards functional goals.    PT Treatment/Interventions  ADLs/Self Care Home Management;Moist Heat;Cryotherapy;Therapeutic activities;Functional mobility training;Stair training;Gait training;Therapeutic exercise;Neuromuscular re-education;Balance training;Patient/family education;Manual techniques;Passive range of motion    PT Next Visit Plan  Hip and quad strength, ankle strength and flexiblility, dynamic balance with dual task activities for improved coordination    PT Home Exercise Plan  Access Code: [email or web address]     Consulted and Agree with Plan of Care  Patient;Family member/caregiver    Family Member Consulted  Spouse       Patient will benefit from skilled therapeutic intervention in order to improve the following deficits and impairments:  Abnormal gait, Decreased activity tolerance, Decreased safety awareness, Decreased strength, Impaired flexibility, Postural dysfunction, Pain, Improper body mechanics, Decreased range of motion, Decreased endurance, Decreased balance, Decreased mobility, Difficulty walking, Hypomobility  Visit Diagnosis: Unsteadiness on feet  History of falling  Muscle weakness (generalized)  Difficulty in walking, not elsewhere  classified     Problem List Patient Active Problem  List   Diagnosis Date Noted  . Dupuytren's contracture 10/30/2017  . Frequent falls 10/30/2017  . History of closed head injury 07/25/2017  . Osteoarthritis, multiple sites 07/08/2016  . Risk for falls 07/08/2016  . Premature atrial contractions 05/11/2015  . Sepsis ([place]) 04/29/2015  . History of small bowel obstruction 04/29/2015  . History of traumatic brain injury 04/29/2015  . Acute respiratory failure ([place]) 04/29/2015  . CAP (community acquired pneumonia)   . Unspecified cerebral artery occlusion with cerebral infarction 08/25/2012  . Aphasia 08/25/2012  . Macular degeneration 04/25/2012  . CVA (cerebral infarction) 03/20/2012  . Small bowel obstruction ([place]) 09/12/2011  . Nausea & vomiting 09/12/2011  . Memory loss 04/01/2010  . 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 SKIN LESION 10/24/2009  . NEOPLASM, SKIN, UNCERTAIN BEHAVIOR 84/66/5993  . DYSGEUSIA 10/10/2009  . Hyperlipidemia 07/20/2008  . DEPRESSION 07/20/2008  . Essential hypertension 07/20/2008  . RHINITIS 07/20/2008  . COLONIC POLYPS, HX OF 07/20/2008  . GERD 07/18/2008  . PROSTATE CANCER, HX OF 07/18/2008    Zannie Cove, PT 01/13/2018, 2:46 PM  [facility] [address] [place], [place], [address] Phone: [phone]   Fax:  [phone]  Name: Eric George MRN: [id number] Date of Birth: 21-Dec-1930

## 2018-01-18 ENCOUNTER — Encounter

## 2018-01-20 ENCOUNTER — Encounter: Payer: Self-pay | Admitting: Physical Therapy

## 2018-01-20 ENCOUNTER — Ambulatory Visit: Payer: Medicare Other | Admitting: Physical Therapy

## 2018-01-20 DIAGNOSIS — R262 Difficulty in walking, not elsewhere classified: Secondary | ICD-10-CM | POA: Diagnosis not present

## 2018-01-20 DIAGNOSIS — M6281 Muscle weakness (generalized): Secondary | ICD-10-CM

## 2018-01-20 DIAGNOSIS — Z9181 History of falling: Secondary | ICD-10-CM | POA: Diagnosis not present

## 2018-01-20 DIAGNOSIS — R2681 Unsteadiness on feet: Secondary | ICD-10-CM | POA: Diagnosis not present

## 2018-01-20 NOTE — Therapy (Signed)
[facility] [address] [place], [place], [address] Phone: [phone]   Fax:  [phone]  Physical Therapy Treatment  Patient Details  Name: Eric George MRN: [id number] Date of Birth: [birth year] Referring Provider: Carolann Littler, MD    Encounter Date: 01/20/2018  PT End of Session - 01/20/18 1407    Visit Number  14    Date for PT Re-Evaluation  01/24/18    Authorization Type  Medicare A and B    Authorization Time Period  11/23/17 to 01/24/18    PT Start Time  1400    PT Stop Time  1440    PT Time Calculation (min)  40 min    Activity Tolerance  No increased pain;Patient tolerated treatment well    Behavior During Therapy  [facility] for tasks assessed/performed       Past Medical History:  Diagnosis Date  . Bowel obstruction ([place])   . CAP (community acquired pneumonia)   . 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 GERD (gastroesophageal reflux disease)   . Hydrocephalus   . Hyperlipidemia   . Hypertension   . Osteoarthritis, multiple sites 07/08/2016  . 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 Stroke ([place])   . TBI (traumatic brain injury) [facility])     Past Surgical History:  Procedure Laterality Date  . BRAIN SURGERY    . HERNIA REPAIR    . PROSTATECTOMY    . TEE WITHOUT CARDIOVERSION  03/22/2012   Procedure: TRANSESOPHAGEAL ECHOCARDIOGRAM (TEE);  Surgeon: [name], MD;  Location: [facility] ENDOSCOPY;  Service: Cardiovascular;  Laterality: N/A;    There were no vitals filed for this visit.  Subjective Assessment - 01/20/18 1401    Subjective  I wasn't in the mood to come today    Patient Stated Goals  decrease risk of falling     Currently in Pain?  No/denies         [facility] PT Assessment - 01/20/18 0001      Transfers   Five time sit to stand comments   19 sec with BUE      Berg Balance Test   Sit to Stand  Able to stand without using hands and  stabilize independently   hands on thighs, but not used for stability   Standing Unsupported  Able to stand safely 2 minutes    Sitting with Back Unsupported but Feet Supported on Floor or Stool  Able to sit safely and securely 2 minutes    Stand to Sit  Sits safely with minimal use of hands    Transfers  Able to transfer safely, minor use of hands    Standing Unsupported with Eyes Closed  Able to stand 10 seconds safely    Standing Ubsupported with Feet Together  Able to place feet together independently and stand 1 minute safely    From Standing, Reach Forward with Outstretched Arm  Can reach forward >5 cm safely (2")    From Standing Position, Pick up Object from Floor  Able to pick up shoe safely and easily    From Standing Position, Turn to Look Behind Over each Shoulder  Looks behind from both sides and weight shifts well    Turn 360 Degrees  Able to turn 360 degrees safely one side only in 4 seconds or less    Standing Unsupported, Alternately Place Feet on Step/Stool  Able to complete  4 steps without aid or supervision    Standing Unsupported, One Foot in Front  Able to plae foot ahead of the other independently and hold 30 seconds    Standing on One Leg  Tries to lift leg/unable to hold 3 seconds but remains standing independently    Total Score  47      Timed Up and Go Test   TUG Comments  14 sec average of 3x - no AD; needs UE to stand   13, 14, 15                  [facility] Adult PT Treatment/Exercise - 01/20/18 0001      Knee/Hip Exercises: Stretches   Active Hamstring Stretch  Right;Left;2 reps;30 seconds      Knee/Hip Exercises: Aerobic   Nustep  x8 min L2, PT present to [place]      Knee/Hip Exercises: Standing   Heel Raises  Right;Left;20 reps    Hip Abduction  Both;10 reps;1 set    Abduction Limitations  3    Forward Step Up  Right;Left;Step Height: 6";Hand Hold: 2;5 reps   3lb    Other Standing Knee Exercises  standing hip abdution and knee flexion 3lb -  2x10      Knee/Hip Exercises: Seated   Long Arc Quad  Strengthening;Right;Left;Weights;2 sets;10 reps    Long Arc Quad Weight  3 lbs.               PT Short Term Goals - 12/28/17 1204      PT SHORT TERM GOAL #1   Title  Pt will be independent with his initial HEP to improve LE strength and balance.    Time  4    Period  Weeks    Status  Achieved      PT SHORT TERM GOAL #2   Title  Pt will demo improved gastroc strength, evident by his ability to complete atleast 5 single leg heel raises on each LE.    Baseline  5 on Lt, 7 on Rt     Time  4    Period  Weeks    Status  Achieved        PT Long Term Goals - 01/20/18 1411      PT LONG TERM GOAL #1   Title  Pt will be independent with advanced HEP to decrease risk of deconditioning and future falls after discharge from PT.     Status  On-going      PT LONG TERM GOAL #2   Title  Pt will complete 5x sit to stand in less than 14 sec without UE support, to reflect improvements in functional strength and power.     Baseline  19 sec using BUE    Status  On-going      PT LONG TERM GOAL #3   Title  Pt will be able to complete the TUG in less than 13 sec, with LRAD, to reflect improvements in his balance and stability with ambulation in the community.     Baseline  14 seconds average of 3x    Status  On-going      PT LONG TERM GOAL #4   Title  Pt will have atleast 6 point increase in his BERG balance test to reflect a clinically significant improvement in his balance.     Baseline  32/56 to 47/56    Status  Achieved            Plan -  01/20/18 1408    Clinical Impression Statement  Pt demonstrates improved Berg to 47/56.  He continues to make slow and steady progress.  Pt was able to increase resistance today. Pt will benefit from skilled PT to continue working on strength and balance.    PT Treatment/Interventions  ADLs/Self Care Home Management;Moist Heat;Cryotherapy;Therapeutic activities;Functional mobility  training;Stair training;Gait training;Therapeutic exercise;Neuromuscular re-education;Balance training;Patient/family education;Manual techniques;Passive range of motion    PT Next Visit Plan  Hip and quad strength, ankle strength and flexiblility, dynamic balance with dual task activities for improved coordination    PT Home Exercise Plan  Access Code: [email or web address]     Consulted and Agree with Plan of Care  Patient;Family member/caregiver    Family Member Consulted  Spouse       Patient will benefit from skilled therapeutic intervention in order to improve the following deficits and impairments:  Abnormal gait, Decreased activity tolerance, Decreased safety awareness, Decreased strength, Impaired flexibility, Postural dysfunction, Pain, Improper body mechanics, Decreased range of motion, Decreased endurance, Decreased balance, Decreased mobility, Difficulty walking, Hypomobility  Visit Diagnosis: Unsteadiness on feet  History of falling  Muscle weakness (generalized)  Difficulty in walking, not elsewhere classified     Problem List Patient Active Problem List   Diagnosis Date Noted  . Dupuytren's contracture 10/30/2017  . Frequent falls 10/30/2017  . History of closed head injury 07/25/2017  . Osteoarthritis, multiple sites 07/08/2016  . Risk for falls 07/08/2016  . Premature atrial contractions 05/11/2015  . Sepsis ([place]) 04/29/2015  . History of small bowel obstruction 04/29/2015  . History of traumatic brain injury 04/29/2015  . Acute respiratory failure ([place]) 04/29/2015  . CAP (community acquired pneumonia)   . Unspecified cerebral artery occlusion with cerebral infarction 08/25/2012  . Aphasia 08/25/2012  . Macular degeneration 04/25/2012  . CVA (cerebral infarction) 03/20/2012  . Small bowel obstruction ([place]) 09/12/2011  . Nausea & vomiting 09/12/2011  . Memory loss 04/01/2010  . 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 SKIN LESION 10/24/2009  . NEOPLASM, SKIN,  UNCERTAIN BEHAVIOR 03/54/6568  . DYSGEUSIA 10/10/2009  . Hyperlipidemia 07/20/2008  . DEPRESSION 07/20/2008  . Essential hypertension 07/20/2008  . RHINITIS 07/20/2008  . COLONIC POLYPS, HX OF 07/20/2008  . GERD 07/18/2008  . PROSTATE CANCER, HX OF 07/18/2008    Zannie Cove, PT 01/20/2018, 2:46 PM  [facility] [address] [place], [place], [address] Phone: [phone]   Fax:  [phone]  Name: EYAD ROCHFORD MRN: [id number] Date of Birth: 01-05-1931

## 2018-01-24 ENCOUNTER — Other Ambulatory Visit: Payer: Self-pay | Admitting: Family Medicine

## 2018-01-24 ENCOUNTER — Encounter: Payer: Self-pay | Admitting: Physical Therapy

## 2018-01-24 ENCOUNTER — Ambulatory Visit: Payer: Medicare Other | Admitting: Physical Therapy

## 2018-01-24 DIAGNOSIS — R262 Difficulty in walking, not elsewhere classified: Secondary | ICD-10-CM

## 2018-01-24 DIAGNOSIS — M6281 Muscle weakness (generalized): Secondary | ICD-10-CM | POA: Diagnosis not present

## 2018-01-24 DIAGNOSIS — Z9181 History of falling: Secondary | ICD-10-CM

## 2018-01-24 DIAGNOSIS — R2681 Unsteadiness on feet: Secondary | ICD-10-CM

## 2018-01-24 NOTE — Therapy (Signed)
[facility] [address] [place], [place], [address] Phone: [phone]   Fax:  [phone]  Physical Therapy Treatment  Patient Details  Name: Eric George MRN: [id number] Date of Birth: April 17, 1931 Referring Provider: Carolann Littler, MD    Encounter Date: 01/24/2018  PT End of Session - 01/24/18 1357    Visit Number  15    Date for PT Re-Evaluation  03/07/18    Authorization Type  Medicare A and B    PT Start Time  8828    PT Stop Time  0034    PT Time Calculation (min)  42 min    Activity Tolerance  No increased pain;Patient tolerated treatment well    Behavior During Therapy  [facility] for tasks assessed/performed       Past Medical History:  Diagnosis Date  . Bowel obstruction ([place])   . CAP (community acquired pneumonia)   . 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 GERD (gastroesophageal reflux disease)   . Hydrocephalus   . Hyperlipidemia   . Hypertension   . Osteoarthritis, multiple sites 07/08/2016  . 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 Stroke ([place])   . TBI (traumatic brain injury) [facility])     Past Surgical History:  Procedure Laterality Date  . BRAIN SURGERY    . HERNIA REPAIR    . PROSTATECTOMY    . TEE WITHOUT CARDIOVERSION  03/22/2012   Procedure: TRANSESOPHAGEAL ECHOCARDIOGRAM (TEE);  Surgeon: [name], MD;  Location: [facility] ENDOSCOPY;  Service: Cardiovascular;  Laterality: N/A;    There were no vitals filed for this visit.  Subjective Assessment - 01/24/18 1400    Subjective  I am doing okay so far today.  My knees are pretty good.    Patient is accompained by:  Family member    Pertinent History  HTN, TBI, stroke    Patient Stated Goals  decrease risk of falling     Currently in Pain?  No/denies         [facility] PT Assessment - 01/24/18 0001      Assessment   Medical Diagnosis  Frequent falls       Transfers   Five time sit to stand  comments   19 sec with BUE      Berg Balance Test   Sit to Stand  Able to stand without using hands and stabilize independently   hands on thighs, but not used for stability   Standing Unsupported  Able to stand safely 2 minutes    Sitting with Back Unsupported but Feet Supported on Floor or Stool  Able to sit safely and securely 2 minutes    Stand to Sit  Sits safely with minimal use of hands    Transfers  Able to transfer safely, minor use of hands    Standing Unsupported with Eyes Closed  Able to stand 10 seconds safely    Standing Ubsupported with Feet Together  Able to place feet together independently and stand 1 minute safely    From Standing, Reach Forward with Outstretched Arm  Can reach forward >5 cm safely (2")    From Standing Position, Pick up Object from Floor  Able to pick up shoe safely and easily    From Standing Position, Turn to Look Behind Over each Shoulder  Looks behind from both sides and weight shifts well    Turn 360 Degrees  Able to turn 360 degrees safely one side only in 4 seconds or less    Standing Unsupported, Alternately Place Feet on Step/Stool  Able to complete 4 steps without aid or supervision    Standing Unsupported, One Foot in Front  Able to plae foot ahead of the other independently and hold 30 seconds    Standing on One Leg  Tries to lift leg/unable to hold 3 seconds but remains standing independently    Total Score  47      Timed Up and Go Test   TUG Comments  14 sec average of 3x - no AD; needs UE to stand   13, 14, 15                  [facility] Adult PT Treatment/Exercise - 01/24/18 0001      Knee/Hip Exercises: Stretches   Active Hamstring Stretch  Right;Left;2 reps;30 seconds      Knee/Hip Exercises: Aerobic   Nustep  10 min  L3, PT present to [place]      Knee/Hip Exercises: Standing   Heel Raises  Right;Left;20 reps    Hip Abduction  Both;10 reps;1 set    Abduction Limitations  3    Forward Step Up  Right;Left;Step Height:  6";Hand Hold: 2;10 reps   3lb    Other Standing Knee Exercises  standing knee flexion 3lb - 2x10      Knee/Hip Exercises: Seated   Long Arc Quad  Strengthening;Right;Left;Weights;2 sets;10 reps    Long Arc Quad Weight  3 lbs.    Marching  Strengthening;Right;Left;20 reps;Weights    Marching Weights  3 lbs.      Knee/Hip Exercises: Supine   Bridges  Both;20 reps    Bridges Limitations  re tband clamshell with bridge    Other Supine Knee/Hip Exercises  alternating UE/LE - 20x 3lb weights UE/LE    Other Supine Knee/Hip Exercises  heel slides with cues to go slow and not let       Knee/Hip Exercises: Sidelying   Clams  each LE 2x10 reps with green TB               PT Short Term Goals - 12/28/17 1204      PT SHORT TERM GOAL #1   Title  Pt will be independent with his initial HEP to improve LE strength and balance.    Time  4    Period  Weeks    Status  Achieved      PT SHORT TERM GOAL #2   Title  Pt will demo improved gastroc strength, evident by his ability to complete atleast 5 single leg heel raises on each LE.    Baseline  5 on Lt, 7 on Rt     Time  4    Period  Weeks    Status  Achieved        PT Long Term Goals - 01/24/18 1439      PT LONG TERM GOAL #1   Title  Pt will be independent with advanced HEP to decrease risk of deconditioning and future falls after discharge from PT.     Time  6    Period  Weeks    Status  On-going    Target Date  03/07/18      PT LONG TERM GOAL #2   Title  Pt will complete 5x sit to stand in less than 14 sec without UE support, to reflect improvements in functional strength and  power.     Baseline  19 sec using BUE    Time  6    Period  Weeks    Status  On-going    Target Date  03/07/18      PT LONG TERM GOAL #3   Title  Pt will be able to complete the TUG in less than 13 sec, with LRAD, to reflect improvements in his balance and stability with ambulation in the community.     Baseline  14    Time  6    Period  Weeks     Status  On-going    Target Date  03/07/18      PT LONG TERM GOAL #5   Title  Improved Berg score to > or = to 50/56 for reduced risk of falls    Baseline  47/56 (01/20/18)    Time  6    Period  Weeks    Status  New    Target Date  03/07/18            Plan - 01/24/18 1431    Clinical Impression Statement  Patient continues to make progress with strength and endurance.  He tolerated >40 minutes of strength and endurance exercises with minimal breaks.  Pt has overall made good progress with Berg balance.  He continues to have decreased balance and difficutly with HEP needing a lot of assistance and cues, his spouse has a hard time getting him to do the exercises.  He reports no increased pain during treatment today.  He did report fatigue and was visibly fatigued during exercises need some tactile cues to maintain correct technique.  Pt will benefit from skilled PT to progress strength, balance and ensure transition to HEP.  Pt does not demonstrate cognitive awareness and memory to do exercises independently wihtout assistance from caregiver/spouse at this time    Rehab Potential  Good    PT Frequency  2x / week    PT Duration  6 weeks    PT Treatment/Interventions  ADLs/Self Care Home Management;Moist Heat;Cryotherapy;Therapeutic activities;Functional mobility training;Stair training;Gait training;Therapeutic exercise;Neuromuscular re-education;Balance training;Patient/family education;Manual techniques;Passive range of motion    PT Next Visit Plan  Hip and quad strength, ankle strength and flexiblility, dynamic balance with dual task activities for improved coordination; progress HEP as able    PT Home Exercise Plan  Access Code: [email or web address]     Consulted and Agree with Plan of Care  Patient;Family member/caregiver    Family Member Consulted  Spouse       Patient will benefit from skilled therapeutic intervention in order to improve the following deficits and impairments:  Abnormal gait,  Decreased activity tolerance, Decreased safety awareness, Decreased strength, Impaired flexibility, Postural dysfunction, Pain, Improper body mechanics, Decreased range of motion, Decreased endurance, Decreased balance, Decreased mobility, Difficulty walking, Hypomobility  Visit Diagnosis: Unsteadiness on feet  History of falling  Muscle weakness (generalized)  Difficulty in walking, not elsewhere classified     Problem List Patient Active Problem List   Diagnosis Date Noted  . Dupuytren's contracture 10/30/2017  . Frequent falls 10/30/2017  . History of closed head injury 07/25/2017  . Osteoarthritis, multiple sites 07/08/2016  . Risk for falls 07/08/2016  . Premature atrial contractions 05/11/2015  . Sepsis ([place]) 04/29/2015  . History of small bowel obstruction 04/29/2015  . History of traumatic brain injury 04/29/2015  . Acute respiratory failure ([place]) 04/29/2015  . CAP (community acquired pneumonia)   . Unspecified cerebral artery occlusion with cerebral infarction  08/25/2012  . Aphasia 08/25/2012  . Macular degeneration 04/25/2012  . CVA (cerebral infarction) 03/20/2012  . Small bowel obstruction ([place]) 09/12/2011  . Nausea & vomiting 09/12/2011  . Memory loss 04/01/2010  . 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 SKIN LESION 10/24/2009  . NEOPLASM, SKIN, UNCERTAIN BEHAVIOR 40/76/8088  . DYSGEUSIA 10/10/2009  . Hyperlipidemia 07/20/2008  . DEPRESSION 07/20/2008  . Essential hypertension 07/20/2008  . RHINITIS 07/20/2008  . COLONIC POLYPS, HX OF 07/20/2008  . GERD 07/18/2008  . PROSTATE CANCER, HX OF 07/18/2008    Zannie Cove, PT 01/24/2018, 2:52 PM  [facility] [address], [place], [place], [address] Phone: [phone]   Fax:  [phone]  Name: Eric George MRN: [id number] Date of Birth: 1930-05-11

## 2018-01-27 ENCOUNTER — Encounter: Payer: Self-pay | Admitting: Physical Therapy

## 2018-01-27 ENCOUNTER — Ambulatory Visit: Payer: Medicare Other | Admitting: Physical Therapy

## 2018-01-27 DIAGNOSIS — Z9181 History of falling: Secondary | ICD-10-CM

## 2018-01-27 DIAGNOSIS — R2681 Unsteadiness on feet: Secondary | ICD-10-CM | POA: Diagnosis not present

## 2018-01-27 DIAGNOSIS — M6281 Muscle weakness (generalized): Secondary | ICD-10-CM

## 2018-01-27 DIAGNOSIS — R262 Difficulty in walking, not elsewhere classified: Secondary | ICD-10-CM

## 2018-01-27 NOTE — Therapy (Signed)
[facility] [address], [place], [place], [address] Phone: [phone]   Fax:  [phone]  Physical Therapy Treatment  Patient Details  Name: Eric George MRN: [id number] Date of Birth: Feb 19, 1931 Referring Provider (PT): Carolann Littler, MD    Encounter Date: 01/27/2018  PT End of Session - 01/27/18 1414    Visit Number  16    Date for PT Re-Evaluation  03/07/18    Authorization Type  Medicare A and B    Authorization Time Period  01/25/18 to 03/07/18    PT Start Time  1400    PT Stop Time  1439    PT Time Calculation (min)  39 min    Activity Tolerance  No increased pain;Patient tolerated treatment well    Behavior During Therapy  [facility] for tasks assessed/performed       Past Medical History:  Diagnosis Date  . Bowel obstruction ([place])   . CAP (community acquired pneumonia)   . 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 GERD (gastroesophageal reflux disease)   . Hydrocephalus   . Hyperlipidemia   . Hypertension   . Osteoarthritis, multiple sites 07/08/2016  . 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 Stroke ([place])   . TBI (traumatic brain injury) [facility])     Past Surgical History:  Procedure Laterality Date  . BRAIN SURGERY    . HERNIA REPAIR    . PROSTATECTOMY    . TEE WITHOUT CARDIOVERSION  03/22/2012   Procedure: TRANSESOPHAGEAL ECHOCARDIOGRAM (TEE);  Surgeon: [name], MD;  Location: [facility] ENDOSCOPY;  Service: Cardiovascular;  Laterality: N/A;    There were no vitals filed for this visit.  Subjective Assessment - 01/27/18 1402    Subjective  Pt reports that things are going well. No complaints at this time.     Patient is accompained by:  Family member    Pertinent History  HTN, TBI, stroke    Patient Stated Goals  decrease risk of falling     Currently in Pain?  No/denies                       [facility] Adult PT Treatment/Exercise -  01/27/18 0001      Knee/Hip Exercises: Aerobic   Nustep  x5 min, L1 increased one level each minute, decrease back to L1 by end of 5 min      Knee/Hip Exercises: Standing   Heel Raises  Both;2 sets;20 reps;Limitations    Heel Raises Limitations  single leg heel raise with contralateral LE propped on step x10 reps each     Hip Abduction  Stengthening;Both;3 sets;10 reps    Abduction Limitations  yellow TB around ankles     Forward Step Up  2 sets;Both;15 reps;Hand Hold: 2;Step Height: 6";Limitations    Forward Step Up Limitations  contralateral hip flexion      Knee/Hip Exercises: Seated   Long Arc Quad  Both;2 sets;15 reps    Long Arc Quad Weight  4 lbs.    Other Seated Knee/Hip Exercises  seated trunk rotation stretch 5x10 reps each side     Marching  Strengthening;Both;2 sets;15 reps    Marching Weights  4 lbs.    Hamstring Curl  Both;Strengthening;2 sets;15 reps    Hamstring Limitations  double green TB           Balance Exercises - 01/27/18 1411  Balance Exercises: Standing   Tandem Stance  Eyes closed;Intermittent upper extremity support;2 reps;30 secs        PT Education - 01/27/18 1414    Education Details  technique with therex     Person(s) Educated  Patient    Methods  Explanation;Tactile cues;Verbal cues    Comprehension  Returned demonstration;Verbalized understanding       PT Short Term Goals - 12/28/17 1204      PT SHORT TERM GOAL #1   Title  Pt will be independent with his initial HEP to improve LE strength and balance.    Time  4    Period  Weeks    Status  Achieved      PT SHORT TERM GOAL #2   Title  Pt will demo improved gastroc strength, evident by his ability to complete atleast 5 single leg heel raises on each LE.    Baseline  5 on Lt, 7 on Rt     Time  4    Period  Weeks    Status  Achieved        PT Long Term Goals - 01/24/18 1439      PT LONG TERM GOAL #1   Title  Pt will be independent with advanced HEP to decrease risk of  deconditioning and future falls after discharge from PT.     Time  6    Period  Weeks    Status  On-going    Target Date  03/07/18      PT LONG TERM GOAL #2   Title  Pt will complete 5x sit to stand in less than 14 sec without UE support, to reflect improvements in functional strength and power.     Baseline  19 sec using BUE    Time  6    Period  Weeks    Status  On-going    Target Date  03/07/18      PT LONG TERM GOAL #3   Title  Pt will be able to complete the TUG in less than 13 sec, with LRAD, to reflect improvements in his balance and stability with ambulation in the community.     Baseline  14    Time  6    Period  Weeks    Status  On-going    Target Date  03/07/18      PT LONG TERM GOAL #5   Title  Improved Berg score to > or = to 50/56 for reduced risk of falls    Baseline  47/56 (01/20/18)    Time  6    Period  Weeks    Status  New    Target Date  03/07/18            Plan - 01/27/18 1435    Clinical Impression Statement  Pt demonstrates improved gastroc strength this session, without knee flexion compensation compared to previous sessions. He did require intermittent rest breaks with increase in reps/resistance during standing exercise. Ended session with notable LE fatigue, but no increase in pain was reported. Will continue to push LE strength, flexibility, endurance and proprioception moving forward.     Rehab Potential  Good    PT Frequency  2x / week    PT Duration  6 weeks    PT Treatment/Interventions  ADLs/Self Care Home Management;Moist Heat;Cryotherapy;Therapeutic activities;Functional mobility training;Stair training;Gait training;Therapeutic exercise;Neuromuscular re-education;Balance training;Patient/family education;Manual techniques;Passive range of motion    PT Next Visit Plan  Hip and quad strength,  ankle strength and flexiblility, dynamic balance with dual task activities for improved coordination; progress HEP as able    PT Home Exercise Plan   Access Code: [email or web address]     Consulted and Agree with Plan of Care  Patient;Family member/caregiver    Family Member Consulted  Spouse       Patient will benefit from skilled therapeutic intervention in order to improve the following deficits and impairments:  Abnormal gait, Decreased activity tolerance, Decreased safety awareness, Decreased strength, Impaired flexibility, Postural dysfunction, Pain, Improper body mechanics, Decreased range of motion, Decreased endurance, Decreased balance, Decreased mobility, Difficulty walking, Hypomobility  Visit Diagnosis: Unsteadiness on feet  History of falling  Muscle weakness (generalized)  Difficulty in walking, not elsewhere classified     Problem List Patient Active Problem List   Diagnosis Date Noted  . Dupuytren's contracture 10/30/2017  . Frequent falls 10/30/2017  . History of closed head injury 07/25/2017  . Osteoarthritis, multiple sites 07/08/2016  . Risk for falls 07/08/2016  . Premature atrial contractions 05/11/2015  . Sepsis ([place]) 04/29/2015  . History of small bowel obstruction 04/29/2015  . History of traumatic brain injury 04/29/2015  . Acute respiratory failure ([place]) 04/29/2015  . CAP (community acquired pneumonia)   . Unspecified cerebral artery occlusion with cerebral infarction 08/25/2012  . Aphasia 08/25/2012  . Macular degeneration 04/25/2012  . CVA (cerebral infarction) 03/20/2012  . Small bowel obstruction ([place]) 09/12/2011  . Nausea & vomiting 09/12/2011  . Memory loss 04/01/2010  . 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 SKIN LESION 10/24/2009  . NEOPLASM, SKIN, UNCERTAIN BEHAVIOR 54/98/2641  . DYSGEUSIA 10/10/2009  . Hyperlipidemia 07/20/2008  . DEPRESSION 07/20/2008  . Essential hypertension 07/20/2008  . RHINITIS 07/20/2008  . COLONIC POLYPS, HX OF 07/20/2008  . GERD 07/18/2008  . PROSTATE CANCER, HX OF 07/18/2008     2:41 PM,01/27/18 Sherol Dade PT, DPT [place] [address], [place], [place], [address] Phone: [phone]   Fax:  [phone]  Name: Eric George MRN: [id number] Date of Birth: [birth year]

## 2018-02-01 ENCOUNTER — Ambulatory Visit: Payer: Medicare Other | Attending: Family Medicine | Admitting: Physical Therapy

## 2018-02-01 DIAGNOSIS — M6281 Muscle weakness (generalized): Secondary | ICD-10-CM | POA: Diagnosis not present

## 2018-02-01 DIAGNOSIS — R262 Difficulty in walking, not elsewhere classified: Secondary | ICD-10-CM | POA: Diagnosis not present

## 2018-02-01 DIAGNOSIS — R2681 Unsteadiness on feet: Secondary | ICD-10-CM | POA: Diagnosis not present

## 2018-02-01 DIAGNOSIS — Z9181 History of falling: Secondary | ICD-10-CM | POA: Insufficient documentation

## 2018-02-01 NOTE — Therapy (Signed)
[facility] [address] [place], [place], [address] Phone: [phone]   Fax:  [phone]  Physical Therapy Treatment  Patient Details  Name: Eric George MRN: [id number] Date of Birth: 1931-04-26 Referring Provider (PT): Carolann Littler, MD    Encounter Date: 02/01/2018  PT End of Session - 02/01/18 1404    Visit Number  17    Date for PT Re-Evaluation  03/07/18    Authorization Type  Medicare A and B    Authorization Time Period  01/25/18 to 03/07/18    PT Start Time  0932    PT Stop Time  1441    PT Time Calculation (min)  38 min    Activity Tolerance  No increased pain;Patient tolerated treatment well    Behavior During Therapy  [facility] for tasks assessed/performed       Past Medical History:  Diagnosis Date  . Bowel obstruction ([place])   . CAP (community acquired pneumonia)   . 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 GERD (gastroesophageal reflux disease)   . Hydrocephalus   . Hyperlipidemia   . Hypertension   . Osteoarthritis, multiple sites 07/08/2016  . 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 Stroke ([place])   . TBI (traumatic brain injury) [facility])     Past Surgical History:  Procedure Laterality Date  . BRAIN SURGERY    . HERNIA REPAIR    . PROSTATECTOMY    . TEE WITHOUT CARDIOVERSION  03/22/2012   Procedure: TRANSESOPHAGEAL ECHOCARDIOGRAM (TEE);  Surgeon: [name], MD;  Location: [facility] ENDOSCOPY;  Service: Cardiovascular;  Laterality: N/A;    There were no vitals filed for this visit.  Subjective Assessment - 02/01/18 1435    Subjective  Pt states his neck feels a little stiff.  Reported it felt better after exercises     Patient is accompained by:  Family member    Pertinent History  HTN, TBI, stroke    Patient Stated Goals  decrease risk of falling     Currently in Pain?  No/denies                       [facility] Adult PT  Treatment/Exercise - 02/01/18 0001      Neuro Re-ed    Neuro Re-ed Details   standing on foam mat without UE support x 2 min; UE support of 1 x 20 calf raises      Knee/Hip Exercises: Stretches   Active Hamstring Stretch  Right;Left;2 reps;30 seconds      Knee/Hip Exercises: Aerobic   Nustep  10 min  at L3 PT present to [place]      Knee/Hip Exercises: Standing   Heel Raises  Both;2 sets;20 reps;Limitations    Heel Raises Limitations  single leg heel raise with contralateral LE propped on step x10 reps each     Hip Abduction  Stengthening;Both;3 sets;10 reps    Abduction Limitations  yellow TB around ankles     Forward Step Up  2 sets;Both;15 reps;Hand Hold: 2;Step Height: 6"    Other Standing Knee Exercises  UE D2 diagonals with red band - 20x cues for posture      Knee/Hip Exercises: Seated   Long Arc Quad  Both;2 sets;15 reps    Long Arc Quad Weight  4 lbs.    Other Seated Knee/Hip Exercises  seated shouler rolls and horizontal abduction  posture strength- red band - 20x    Marching  Strengthening;Both;2 sets;15 reps    Marching Weights  4 lbs.    Hamstring Curl  Both;Strengthening;2 sets;15 reps    Hamstring Limitations  double green TB                PT Short Term Goals - 12/28/17 1204      PT SHORT TERM GOAL #1   Title  Pt will be independent with his initial HEP to improve LE strength and balance.    Time  4    Period  Weeks    Status  Achieved      PT SHORT TERM GOAL #2   Title  Pt will demo improved gastroc strength, evident by his ability to complete atleast 5 single leg heel raises on each LE.    Baseline  5 on Lt, 7 on Rt     Time  4    Period  Weeks    Status  Achieved        PT Long Term Goals - 01/24/18 1439      PT LONG TERM GOAL #1   Title  Pt will be independent with advanced HEP to decrease risk of deconditioning and future falls after discharge from PT.     Time  6    Period  Weeks    Status  On-going    Target Date  03/07/18      PT  LONG TERM GOAL #2   Title  Pt will complete 5x sit to stand in less than 14 sec without UE support, to reflect improvements in functional strength and power.     Baseline  19 sec using BUE    Time  6    Period  Weeks    Status  On-going    Target Date  03/07/18      PT LONG TERM GOAL #3   Title  Pt will be able to complete the TUG in less than 13 sec, with LRAD, to reflect improvements in his balance and stability with ambulation in the community.     Baseline  14    Time  6    Period  Weeks    Status  On-going    Target Date  03/07/18      PT LONG TERM GOAL #5   Title  Improved Berg score to > or = to 50/56 for reduced risk of falls    Baseline  47/56 (01/20/18)    Time  6    Period  Weeks    Status  New    Target Date  03/07/18            Plan - 02/01/18 1431    Clinical Impression Statement  Pt was able to increase resistance band resistance today.  Pt demonstrates good endurance and maintains >80 SPM on nustep throughout.  Pt without increased pain at the end of treatment today.  He continues to need skilled PT for improved strength and stability    PT Treatment/Interventions  ADLs/Self Care Home Management;Moist Heat;Cryotherapy;Therapeutic activities;Functional mobility training;Stair training;Gait training;Therapeutic exercise;Neuromuscular re-education;Balance training;Patient/family education;Manual techniques;Passive range of motion    PT Next Visit Plan  KX modifier needeed; Hip and quad strength, ankle strength and flexiblility, dynamic balance with dual task activities for improved coordination; progress HEP as able    PT Home Exercise Plan  Access Code: [email or web address]     Consulted and Agree with Plan of Care  Patient;Family member/caregiver  Family Member Consulted  Spouse       Patient will benefit from skilled therapeutic intervention in order to improve the following deficits and impairments:  Abnormal gait, Decreased activity tolerance, Decreased safety  awareness, Decreased strength, Impaired flexibility, Postural dysfunction, Pain, Improper body mechanics, Decreased range of motion, Decreased endurance, Decreased balance, Decreased mobility, Difficulty walking, Hypomobility  Visit Diagnosis: Unsteadiness on feet  History of falling  Muscle weakness (generalized)  Difficulty in walking, not elsewhere classified     Problem List Patient Active Problem List   Diagnosis Date Noted  . Dupuytren's contracture 10/30/2017  . Frequent falls 10/30/2017  . History of closed head injury 07/25/2017  . Osteoarthritis, multiple sites 07/08/2016  . Risk for falls 07/08/2016  . Premature atrial contractions 05/11/2015  . Sepsis ([place]) 04/29/2015  . History of small bowel obstruction 04/29/2015  . History of traumatic brain injury 04/29/2015  . Acute respiratory failure ([place]) 04/29/2015  . CAP (community acquired pneumonia)   . Unspecified cerebral artery occlusion with cerebral infarction 08/25/2012  . Aphasia 08/25/2012  . Macular degeneration 04/25/2012  . CVA (cerebral infarction) 03/20/2012  . Small bowel obstruction ([place]) 09/12/2011  . Nausea & vomiting 09/12/2011  . Memory loss 04/01/2010  . 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 SKIN LESION 10/24/2009  . NEOPLASM, SKIN, UNCERTAIN BEHAVIOR 77/07/4033  . DYSGEUSIA 10/10/2009  . Hyperlipidemia 07/20/2008  . DEPRESSION 07/20/2008  . Essential hypertension 07/20/2008  . RHINITIS 07/20/2008  . COLONIC POLYPS, HX OF 07/20/2008  . GERD 07/18/2008  . PROSTATE CANCER, HX OF 07/18/2008    Zannie Cove, PT 02/01/2018, 2:43 PM  [facility] [address] [place], [place], [address] Phone: [phone]   Fax:  [phone]  Name: MOHAN ERVEN MRN: [id number] Date of Birth: 1930/12/27

## 2018-02-03 ENCOUNTER — Encounter: Payer: Self-pay | Admitting: Physical Therapy

## 2018-02-03 ENCOUNTER — Ambulatory Visit: Payer: Medicare Other | Admitting: Physical Therapy

## 2018-02-03 DIAGNOSIS — Z9181 History of falling: Secondary | ICD-10-CM | POA: Diagnosis not present

## 2018-02-03 DIAGNOSIS — R262 Difficulty in walking, not elsewhere classified: Secondary | ICD-10-CM | POA: Diagnosis not present

## 2018-02-03 DIAGNOSIS — R2681 Unsteadiness on feet: Secondary | ICD-10-CM

## 2018-02-03 DIAGNOSIS — M6281 Muscle weakness (generalized): Secondary | ICD-10-CM

## 2018-02-03 NOTE — Therapy (Signed)
[facility] [address], [place], [place], [address] Phone: [phone]   Fax:  [phone]  Physical Therapy Treatment  Patient Details  Name: Eric George MRN: [id number] Date of Birth: 01-16-1931 Referring Provider (PT): Carolann Littler, MD    Encounter Date: 02/03/2018  PT End of Session - 02/03/18 1614    Visit Number  18    Date for PT Re-Evaluation  03/07/18    Authorization Type  Medicare A and B    Authorization Time Period  01/25/18 to 03/07/18    PT Start Time  1400    PT Stop Time  1439    PT Time Calculation (min)  39 min    Activity Tolerance  No increased pain;Patient tolerated treatment well    Behavior During Therapy  [facility] for tasks assessed/performed       Past Medical History:  Diagnosis Date  . Bowel obstruction ([place])   . CAP (community acquired pneumonia)   . 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 GERD (gastroesophageal reflux disease)   . Hydrocephalus ([place])   . Hyperlipidemia   . Hypertension   . Osteoarthritis, multiple sites 07/08/2016  . 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 Stroke ([place])   . TBI (traumatic brain injury) [facility])     Past Surgical History:  Procedure Laterality Date  . BRAIN SURGERY    . HERNIA REPAIR    . PROSTATECTOMY    . TEE WITHOUT CARDIOVERSION  03/22/2012   Procedure: TRANSESOPHAGEAL ECHOCARDIOGRAM (TEE);  Surgeon: [name], MD;  Location: [facility] ENDOSCOPY;  Service: Cardiovascular;  Laterality: N/A;    There were no vitals filed for this visit.  Subjective Assessment - 02/03/18 1404    Subjective  Pt reports that things are going well.     Patient is accompained by:  Family member    Pertinent History  HTN, TBI, stroke    Patient Stated Goals  decrease risk of falling     Currently in Pain?  No/denies                       [facility] Adult PT Treatment/Exercise - 02/03/18 0001       Knee/Hip Exercises: Aerobic   Nustep  L1 increased every 2 min up to L3       Knee/Hip Exercises: Standing   Heel Raises  2 sets;Both;10 reps    Heel Raises Limitations  single leg heel raise, heavy use of UE    Hip Abduction  Right;Left;Stengthening;2 sets;10 reps;Knee straight    Abduction Limitations  yellow TB around feet     Forward Step Up  Both;1 set;15 reps    Forward Step Up Limitations  6" 1 UE support and contralateral knee drive to encourage forward weight shift       Knee/Hip Exercises: Seated   Long Arc Quad  Both;2 sets;15 reps;Limitations    Long Arc Quad Weight  5 lbs.    Long [employer] Limitations  last set 10 sec hold in extension after last rep    Marching  Both;1 set;20 reps    Marching Weights  5 lbs.    Hamstring Curl  Strengthening;Both;2 sets;15 reps    Hamstring Limitations  green TB, increased to blue TB second set           Balance Exercises - 02/03/18 1446  Balance Exercises: Standing   Standing Eyes Opened  Narrow base of support (BOS);Foam/compliant surface;Solid surface;Other (comment)   ball toss/catch x10 reps each   Tandem Stance  Eyes open;Other (comment)   CGA, ball toss x10 reps each LE forward        PT Education - 02/03/18 1514    Education Details  technique with therex    Person(s) Educated  Patient    Methods  Explanation    Comprehension  Verbalized understanding       PT Short Term Goals - 02/03/18 1704      PT SHORT TERM GOAL #1   Title  Pt will be independent with his initial HEP to improve LE strength and balance.    Time  4    Period  Weeks    Status  Achieved      PT SHORT TERM GOAL #2   Title  Pt will demo improved gastroc strength, evident by his ability to complete atleast 5 single leg heel raises on each LE.    Baseline  5 on Lt, 7 on Rt     Time  4    Period  Weeks    Status  Achieved        PT Long Term Goals - 02/03/18 1704      PT LONG TERM GOAL #1   Title  Pt will be independent with  advanced HEP to decrease risk of deconditioning and future falls after discharge from PT.     Time  6    Period  Weeks    Status  On-going      PT LONG TERM GOAL #2   Title  Pt will complete 5x sit to stand in less than 14 sec without UE support, to reflect improvements in functional strength and power.     Baseline  19 sec using BUE    Time  6    Period  Weeks    Status  On-going      PT LONG TERM GOAL #3   Title  Pt will be able to complete the TUG in less than 13 sec, with LRAD, to reflect improvements in his balance and stability with ambulation in the community.     Baseline  14    Time  6    Period  Weeks    Status  On-going      PT LONG TERM GOAL #5   Title  Improved Berg score to > or = to 50/56 for reduced risk of falls    Baseline  47/56 (01/20/18)    Time  6    Period  Weeks    Status  New            Plan - 02/03/18 1614    Clinical Impression Statement  Continued this session with therex to promote LE strength and proprioception. Pt was able to progress weight with seated exercise and had no significant difficulty with this. Pt was able to demonstrate good stability during tandem and narrow base of support with ball toss. Encouraged continued HEP adherence moving forward. Will continue with current POC.     PT Treatment/Interventions  ADLs/Self Care Home Management;Moist Heat;Cryotherapy;Therapeutic activities;Functional mobility training;Stair training;Gait training;Therapeutic exercise;Neuromuscular re-education;Balance training;Patient/family education;Manual techniques;Passive range of motion    PT Next Visit Plan  KX modifier needeed; Hip and quad strength, ankle strength and flexiblility, dynamic balance with dual task activities for improved coordination; progress HEP as able    PT Home Exercise Plan  Access Code: [email or web address]     Consulted and Agree with Plan of Care  Patient;Family member/caregiver    Family Member Consulted  Spouse       Patient will  benefit from skilled therapeutic intervention in order to improve the following deficits and impairments:  Abnormal gait, Decreased activity tolerance, Decreased safety awareness, Decreased strength, Impaired flexibility, Postural dysfunction, Pain, Improper body mechanics, Decreased range of motion, Decreased endurance, Decreased balance, Decreased mobility, Difficulty walking, Hypomobility  Visit Diagnosis: Unsteadiness on feet  History of falling  Muscle weakness (generalized)  Difficulty in walking, not elsewhere classified     Problem List Patient Active Problem List   Diagnosis Date Noted  . Dupuytren's contracture 10/30/2017  . Frequent falls 10/30/2017  . History of closed head injury 07/25/2017  . Osteoarthritis, multiple sites 07/08/2016  . Risk for falls 07/08/2016  . Premature atrial contractions 05/11/2015  . Sepsis ([place]) 04/29/2015  . History of small bowel obstruction 04/29/2015  . History of traumatic brain injury 04/29/2015  . Acute respiratory failure ([place]) 04/29/2015  . CAP (community acquired pneumonia)   . Unspecified cerebral artery occlusion with cerebral infarction 08/25/2012  . Aphasia 08/25/2012  . Macular degeneration 04/25/2012  . CVA (cerebral infarction) 03/20/2012  . Small bowel obstruction ([place]) 09/12/2011  . Nausea & vomiting 09/12/2011  . Memory loss 04/01/2010  . 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 SKIN LESION 10/24/2009  . NEOPLASM, SKIN, UNCERTAIN BEHAVIOR 98/33/8250  . DYSGEUSIA 10/10/2009  . Hyperlipidemia 07/20/2008  . DEPRESSION 07/20/2008  . Essential hypertension 07/20/2008  . RHINITIS 07/20/2008  . COLONIC POLYPS, HX OF 07/20/2008  . GERD 07/18/2008  . PROSTATE CANCER, HX OF 07/18/2008    5:06 PM,02/03/18 Sherol Dade PT, DPT [place] [facility] [place] [facility] [address] [place], [place],  [address] Phone: [phone]   Fax:  [phone]  Name: OSIE AMPARO MRN: [id number] Date of Birth: 07-11-1930

## 2018-02-08 ENCOUNTER — Encounter: Payer: Self-pay | Admitting: Physical Therapy

## 2018-02-08 ENCOUNTER — Ambulatory Visit: Payer: Medicare Other | Admitting: Physical Therapy

## 2018-02-08 DIAGNOSIS — R2681 Unsteadiness on feet: Secondary | ICD-10-CM | POA: Diagnosis not present

## 2018-02-08 DIAGNOSIS — R262 Difficulty in walking, not elsewhere classified: Secondary | ICD-10-CM | POA: Diagnosis not present

## 2018-02-08 DIAGNOSIS — M6281 Muscle weakness (generalized): Secondary | ICD-10-CM | POA: Diagnosis not present

## 2018-02-08 DIAGNOSIS — Z9181 History of falling: Secondary | ICD-10-CM

## 2018-02-08 NOTE — Therapy (Signed)
[facility] [address] [place], [place], [address] Phone: [phone]   Fax:  [phone]  Physical Therapy Treatment  Patient Details  Name: Eric George MRN: [id number] Date of Birth: [birth year] Referring Provider (PT): Carolann Littler, MD    Encounter Date: 02/08/2018  PT End of Session - 02/08/18 1316    Visit Number  19    Date for PT Re-Evaluation  03/07/18    Authorization Type  Medicare A and B    Authorization Time Period  01/25/18 to 03/07/18    PT Start Time  1230    PT Stop Time  1310    PT Time Calculation (min)  40 min    Activity Tolerance  No increased pain;Patient tolerated treatment well    Behavior During Therapy  [facility] for tasks assessed/performed       Past Medical History:  Diagnosis Date  . Bowel obstruction ([place])   . CAP (community acquired pneumonia)   . 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 GERD (gastroesophageal reflux disease)   . Hydrocephalus ([place])   . Hyperlipidemia   . Hypertension   . Osteoarthritis, multiple sites 07/08/2016  . 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 Stroke ([place])   . TBI (traumatic brain injury) [facility])     Past Surgical History:  Procedure Laterality Date  . BRAIN SURGERY    . HERNIA REPAIR    . PROSTATECTOMY    . TEE WITHOUT CARDIOVERSION  03/22/2012   Procedure: TRANSESOPHAGEAL ECHOCARDIOGRAM (TEE);  Surgeon: [name], MD;  Location: [facility] ENDOSCOPY;  Service: Cardiovascular;  Laterality: N/A;    There were no vitals filed for this visit.  Subjective Assessment - 02/08/18 1232    Subjective  Pt states things are good. No complaints at this time.    Patient is accompained by:  Family member    Pertinent History  HTN, TBI, stroke    Patient Stated Goals  decrease risk of falling     Currently in Pain?  No/denies                       [facility] Adult PT Treatment/Exercise - 02/08/18  0001      Knee/Hip Exercises: Standing   Heel Raises  Both;2 sets;15 reps    Heel Raises Limitations  on step    Hip Abduction  Both;Knee straight;2 sets;10 reps    Abduction Limitations  yellow TB around feet       Knee/Hip Exercises: Seated   Long Arc Quad  Both;2 sets;Limitations;10 reps    Long Arc Quad Weight  8 lbs.    Other Seated Knee/Hip Exercises  B ankle inversion hold 3 sec x15 reps; B ankle DF with 2.5# weigts across toes x20 reps     Other Seated Knee/Hip Exercises  B ankle circles x20 reps clockwise and counterclockwise     Marching  Both;2 sets;10 reps    Marching Weights  8 lbs.    Hamstring Curl  Strengthening;Both;2 sets;15 reps    Hamstring Limitations  double blue TB           Balance Exercises - 02/08/18 1310      Balance Exercises: Standing   SLS  Eyes open;Foam/compliant surface   LE tap on 4" step x15 reps each LE    Other Standing Exercises  tandem hold with each LE forward and ball  toss x15 reps; Pt walking forward with intermittent ball catch/toss x6 trials CGA        PT Education - 02/08/18 1316    Education Details  technique with therex    Person(s) Educated  Patient    Methods  Explanation;Verbal cues;Tactile cues    Comprehension  Verbalized understanding;Returned demonstration       PT Short Term Goals - 02/03/18 1704      PT SHORT TERM GOAL #1   Title  Pt will be independent with his initial HEP to improve LE strength and balance.    Time  4    Period  Weeks    Status  Achieved      PT SHORT TERM GOAL #2   Title  Pt will demo improved gastroc strength, evident by his ability to complete atleast 5 single leg heel raises on each LE.    Baseline  5 on Lt, 7 on Rt     Time  4    Period  Weeks    Status  Achieved        PT Long Term Goals - 02/08/18 1238      PT LONG TERM GOAL #1   Title  Pt will be independent with advanced HEP to decrease risk of deconditioning and future falls after discharge from PT.     Time  6     Period  Weeks    Status  On-going      PT LONG TERM GOAL #2   Title  Pt will complete 5x sit to stand in less than 14 sec without UE support, to reflect improvements in functional strength and power.     Baseline  15 sec using BUE, difficult without using UE    Time  6    Period  Weeks    Status  On-going      PT LONG TERM GOAL #3   Title  Pt will be able to complete the TUG in less than 13 sec, with LRAD, to reflect improvements in his balance and stability with ambulation in the community.     Baseline  14    Time  6    Period  Weeks    Status  On-going      PT LONG TERM GOAL #4   Title  Pt will have atleast 6 point increase in his BERG balance test to reflect a clinically significant improvement in his balance.     Baseline  32/56 to 47/56    Time  8    Period  Weeks    Status  Achieved      PT LONG TERM GOAL #5   Title  Improved Berg score to > or = to 50/56 for reduced risk of falls    Baseline  47/56 (01/20/18)    Time  6    Period  Weeks    Status  New            Plan - 02/08/18 1317    Clinical Impression Statement  Pt continues to make progress towards increasing strength and proprioception. He was able to complete 5x sit to stand with 4 sec improvement from his last assessment. He was able to progress LE strengthening exercises as well, with no significant muscle fatigue noted end of session. Pt's proprioception was challenged with addition of ball toss/catch perturbation, however he was able to complete dual task of walking/catching without LOB. Will continue with current POC to progress LE strength, balance and  safety with every day activity.     PT Treatment/Interventions  ADLs/Self Care Home Management;Moist Heat;Cryotherapy;Therapeutic activities;Functional mobility training;Stair training;Gait training;Therapeutic exercise;Neuromuscular re-education;Balance training;Patient/family education;Manual techniques;Passive range of motion    PT Next Visit Plan  KX  modifier needeed; Hip and quad strength, ankle strength and flexiblility, dynamic balance with dual task activities for improved coordination; progress HEP as able    PT Home Exercise Plan  Access Code: [email or web address]     Consulted and Agree with Plan of Care  Patient;Family member/caregiver    Family Member Consulted  Spouse       Patient will benefit from skilled therapeutic intervention in order to improve the following deficits and impairments:  Abnormal gait, Decreased activity tolerance, Decreased safety awareness, Decreased strength, Impaired flexibility, Postural dysfunction, Pain, Improper body mechanics, Decreased range of motion, Decreased endurance, Decreased balance, Decreased mobility, Difficulty walking, Hypomobility  Visit Diagnosis: Unsteadiness on feet  History of falling  Muscle weakness (generalized)  Difficulty in walking, not elsewhere classified     Problem List Patient Active Problem List   Diagnosis Date Noted  . Dupuytren's contracture 10/30/2017  . Frequent falls 10/30/2017  . History of closed head injury 07/25/2017  . Osteoarthritis, multiple sites 07/08/2016  . Risk for falls 07/08/2016  . Premature atrial contractions 05/11/2015  . Sepsis ([place]) 04/29/2015  . History of small bowel obstruction 04/29/2015  . History of traumatic brain injury 04/29/2015  . Acute respiratory failure ([place]) 04/29/2015  . CAP (community acquired pneumonia)   . Unspecified cerebral artery occlusion with cerebral infarction 08/25/2012  . Aphasia 08/25/2012  . Macular degeneration 04/25/2012  . CVA (cerebral infarction) 03/20/2012  . Small bowel obstruction ([place]) 09/12/2011  . Nausea & vomiting 09/12/2011  . Memory loss 04/01/2010  . 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 SKIN LESION 10/24/2009  . NEOPLASM, SKIN, UNCERTAIN BEHAVIOR 16/02/9603  . DYSGEUSIA 10/10/2009  . Hyperlipidemia 07/20/2008  . DEPRESSION 07/20/2008  . Essential hypertension 07/20/2008  .  RHINITIS 07/20/2008  . COLONIC POLYPS, HX OF 07/20/2008  . GERD 07/18/2008  . PROSTATE CANCER, HX OF 07/18/2008    1:20 PM,02/08/18 Sherol Dade PT, DPT [place] [facility] [place]  [facility] [address] [place], [place], [address] Phone: [phone]   Fax:  [phone]  Name: JAHZIEL SINN MRN: [id number] Date of Birth: 05-23-1930

## 2018-02-10 ENCOUNTER — Encounter: Payer: Self-pay | Admitting: Physical Therapy

## 2018-02-10 ENCOUNTER — Encounter

## 2018-02-10 ENCOUNTER — Ambulatory Visit: Payer: Medicare Other | Admitting: Physical Therapy

## 2018-02-10 DIAGNOSIS — M6281 Muscle weakness (generalized): Secondary | ICD-10-CM | POA: Diagnosis not present

## 2018-02-10 DIAGNOSIS — R2681 Unsteadiness on feet: Secondary | ICD-10-CM | POA: Diagnosis not present

## 2018-02-10 DIAGNOSIS — Z9181 History of falling: Secondary | ICD-10-CM | POA: Diagnosis not present

## 2018-02-10 DIAGNOSIS — R262 Difficulty in walking, not elsewhere classified: Secondary | ICD-10-CM

## 2018-02-10 NOTE — Therapy (Signed)
[facility] [address], [place], [place], [address] Phone: [phone]   Fax:  [phone]  Physical Therapy Treatment  Patient Details  Name: Eric George MRN: [id number] Date of Birth: Mar 11, 1931 Referring Provider (PT): Carolann Littler, MD    Encounter Date: 02/10/2018  PT End of Session - 02/10/18 1554    Visit Number  20    Date for PT Re-Evaluation  03/07/18    Authorization Type  Medicare A and B    Authorization Time Period  01/25/18 to 03/07/18    Authorization - Visit Number  5    Authorization - Number of Visits  10    PT Start Time  7989    PT Stop Time  1610    PT Time Calculation (min)  40 min    Activity Tolerance  No increased pain;Patient tolerated treatment well    Behavior During Therapy  [facility] for tasks assessed/performed       Past Medical History:  Diagnosis Date  . Bowel obstruction ([place])   . CAP (community acquired pneumonia)   . 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 GERD (gastroesophageal reflux disease)   . Hydrocephalus ([place])   . Hyperlipidemia   . Hypertension   . Osteoarthritis, multiple sites 07/08/2016  . 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 Stroke ([place])   . TBI (traumatic brain injury) [facility])     Past Surgical History:  Procedure Laterality Date  . BRAIN SURGERY    . HERNIA REPAIR    . PROSTATECTOMY    . TEE WITHOUT CARDIOVERSION  03/22/2012   Procedure: TRANSESOPHAGEAL ECHOCARDIOGRAM (TEE);  Surgeon: [name], MD;  Location: [facility];  Service: Cardiovascular;  Laterality: N/A;    There were no vitals filed for this visit.  Subjective Assessment - 02/10/18 1532    Subjective  Pt has no complaints at this time.     Patient is accompained by:  Family member    Pertinent History  HTN, TBI, stroke    Patient Stated Goals  decrease risk of falling     Currently in Pain?  No/denies                        [facility] Adult PT Treatment/Exercise - 02/10/18 0001      Knee/Hip Exercises: Aerobic   Nustep  x5 min intervals from L1 to L4 every minute       Knee/Hip Exercises: Standing   Hip Abduction  Stengthening;Both;2 sets;15 reps    Abduction Limitations  yellow TB around feet     Forward Step Up  Both;2 sets;15 reps;Hand Hold: 1      Knee/Hip Exercises: Seated   Long Arc Quad  Both;2 sets;Limitations;10 reps    Long Arc Quad Weight  8 lbs.    Other Seated Knee/Hip Exercises  B ankle PF/DF rocker board x30 reps     Other Seated Knee/Hip Exercises  B ankle circles x20 reps clockwise and counterclockwise     Marching  Both;2 sets;10 reps    Marching Weights  8 lbs.    Hamstring Curl  Strengthening;Both;2 sets;15 reps    Hamstring Limitations  double blue TB           Balance Exercises - 02/10/18 1605      Balance Exercises: Standing   Tandem Stance  Eyes open;Foam/compliant surface   ball toss  x20 reps each LE forward    SLS  Eyes open;Foam/compliant surface   ball toss x10 reps, contralateral LE toe support       PT Education - 02/10/18 1549    Education Details  technique with therex    Person(s) Educated  Patient    Methods  Explanation;Verbal cues;Handout    Comprehension  Verbalized understanding;Returned demonstration       PT Short Term Goals - 02/03/18 1704      PT SHORT TERM GOAL #1   Title  Pt will be independent with his initial HEP to improve LE strength and balance.    Time  4    Period  Weeks    Status  Achieved      PT SHORT TERM GOAL #2   Title  Pt will demo improved gastroc strength, evident by his ability to complete atleast 5 single leg heel raises on each LE.    Baseline  5 on Lt, 7 on Rt     Time  4    Period  Weeks    Status  Achieved        PT Long Term Goals - 02/08/18 1238      PT LONG TERM GOAL #1   Title  Pt will be independent with advanced HEP to decrease risk of deconditioning and future falls  after discharge from PT.     Time  6    Period  Weeks    Status  On-going      PT LONG TERM GOAL #2   Title  Pt will complete 5x sit to stand in less than 14 sec without UE support, to reflect improvements in functional strength and power.     Baseline  15 sec using BUE, difficult without using UE    Time  6    Period  Weeks    Status  On-going      PT LONG TERM GOAL #3   Title  Pt will be able to complete the TUG in less than 13 sec, with LRAD, to reflect improvements in his balance and stability with ambulation in the community.     Baseline  14    Time  6    Period  Weeks    Status  On-going      PT LONG TERM GOAL #4   Title  Pt will have atleast 6 point increase in his BERG balance test to reflect a clinically significant improvement in his balance.     Baseline  32/56 to 47/56    Time  8    Period  Weeks    Status  Achieved      PT LONG TERM GOAL #5   Title  Improved Berg score to > or = to 50/56 for reduced risk of falls    Baseline  47/56 (01/20/18)    Time  6    Period  Weeks    Status  New            Plan - 02/10/18 1601    Clinical Impression Statement  Pt demonstrates improved steadiness with ambulation into the clinic today. He has good participation with all exercises and was able to perform increase in resistance and reps without significant difficulty. Pt demonstrated good ankle reactions during balance activity which is a significant improvement overall. Ended session with LE fatigue noted during ambulation. Will continue with current POC and exercise progressions to improve proprioception, endurance and LE strength with daily activity.  PT Treatment/Interventions  ADLs/Self Care Home Management;Moist Heat;Cryotherapy;Therapeutic activities;Functional mobility training;Stair training;Gait training;Therapeutic exercise;Neuromuscular re-education;Balance training;Patient/family education;Manual techniques;Passive range of motion    PT Next Visit Plan  KX  modifier needeed; Hip and quad strength progression, ankle strength and flexiblility, dynamic balance with dual task activities for improved coordination; progress HEP as able    PT Home Exercise Plan  Access Code: [email or web address]     Consulted and Agree with Plan of Care  Patient;Family member/caregiver    Family Member Consulted  Spouse       Patient will benefit from skilled therapeutic intervention in order to improve the following deficits and impairments:  Abnormal gait, Decreased activity tolerance, Decreased safety awareness, Decreased strength, Impaired flexibility, Postural dysfunction, Pain, Improper body mechanics, Decreased range of motion, Decreased endurance, Decreased balance, Decreased mobility, Difficulty walking, Hypomobility  Visit Diagnosis: Unsteadiness on feet  History of falling  Muscle weakness (generalized)  Difficulty in walking, not elsewhere classified     Problem List Patient Active Problem List   Diagnosis Date Noted  . Dupuytren's contracture 10/30/2017  . Frequent falls 10/30/2017  . History of closed head injury 07/25/2017  . Osteoarthritis, multiple sites 07/08/2016  . Risk for falls 07/08/2016  . Premature atrial contractions 05/11/2015  . Sepsis ([place]) 04/29/2015  . History of small bowel obstruction 04/29/2015  . History of traumatic brain injury 04/29/2015  . Acute respiratory failure ([place]) 04/29/2015  . CAP (community acquired pneumonia)   . Unspecified cerebral artery occlusion with cerebral infarction 08/25/2012  . Aphasia 08/25/2012  . Macular degeneration 04/25/2012  . CVA (cerebral infarction) 03/20/2012  . Small bowel obstruction ([place]) 09/12/2011  . Nausea & vomiting 09/12/2011  . Memory loss 04/01/2010  . 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 SKIN LESION 10/24/2009  . NEOPLASM, SKIN, UNCERTAIN BEHAVIOR 82/42/3536  . DYSGEUSIA 10/10/2009  . Hyperlipidemia 07/20/2008  . DEPRESSION 07/20/2008  . Essential hypertension  07/20/2008  . RHINITIS 07/20/2008  . COLONIC POLYPS, HX OF 07/20/2008  . GERD 07/18/2008  . PROSTATE CANCER, HX OF 07/18/2008    4:14 PM,02/10/18 Sherol Dade PT, DPT [facility] [address], [place], [place], [address] Phone: [phone]   Fax:  [phone]  Name: Eric George MRN: [id number] Date of Birth: 08-17-1930

## 2018-02-15 ENCOUNTER — Encounter: Payer: Self-pay | Admitting: Physical Therapy

## 2018-02-15 ENCOUNTER — Ambulatory Visit: Payer: Medicare Other | Admitting: Physical Therapy

## 2018-02-15 ENCOUNTER — Ambulatory Visit: Payer: Medicare Other | Admitting: Family Medicine

## 2018-02-15 DIAGNOSIS — M6281 Muscle weakness (generalized): Secondary | ICD-10-CM | POA: Diagnosis not present

## 2018-02-15 DIAGNOSIS — R262 Difficulty in walking, not elsewhere classified: Secondary | ICD-10-CM

## 2018-02-15 DIAGNOSIS — Z9181 History of falling: Secondary | ICD-10-CM | POA: Diagnosis not present

## 2018-02-15 DIAGNOSIS — R2681 Unsteadiness on feet: Secondary | ICD-10-CM | POA: Diagnosis not present

## 2018-02-15 NOTE — Therapy (Signed)
[facility] [address], [place], [place], [address] Phone: [phone]   Fax:  [phone]  Physical Therapy Treatment  Patient Details  Name: Eric George MRN: [id number] Date of Birth: 04/29/1931 Referring Provider (PT): Carolann Littler, MD    Encounter Date: 02/15/2018  PT End of Session - 02/15/18 1319    Visit Number  21    Date for PT Re-Evaluation  03/07/18    Authorization Type  Medicare A and B    Authorization Time Period  01/25/18 to 03/07/18    Authorization - Visit Number  6    Authorization - Number of Visits  10    PT Start Time  3762    PT Stop Time  1513    PT Time Calculation (min)  41 min    Activity Tolerance  No increased pain;Patient tolerated treatment well    Behavior During Therapy  [facility] for tasks assessed/performed       Past Medical History:  Diagnosis Date  . Bowel obstruction ([place])   . CAP (community acquired pneumonia)   . 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 GERD (gastroesophageal reflux disease)   . Hydrocephalus ([place])   . Hyperlipidemia   . Hypertension   . Osteoarthritis, multiple sites 07/08/2016  . 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 Stroke ([place])   . TBI (traumatic brain injury) [facility])     Past Surgical History:  Procedure Laterality Date  . BRAIN SURGERY    . HERNIA REPAIR    . PROSTATECTOMY    . TEE WITHOUT CARDIOVERSION  03/22/2012   Procedure: TRANSESOPHAGEAL ECHOCARDIOGRAM (TEE);  Surgeon: [name], MD;  Location: [facility] ENDOSCOPY;  Service: Cardiovascular;  Laterality: N/A;    There were no vitals filed for this visit.  Subjective Assessment - 02/15/18 1234    Subjective  Pt has no concerns. Things are going well.     Patient is accompained by:  Family member    Pertinent History  HTN, TBI, stroke    Patient Stated Goals  decrease risk of falling     Currently in Pain?  No/denies                        [facility] Adult PT Treatment/Exercise - 02/15/18 0001      Knee/Hip Exercises: Aerobic   Nustep  x5 min intervals from L1 to L4 every minute       Knee/Hip Exercises: Standing   Heel Raises  Both;2 sets;20 reps   on foam pad    Heel Raises Limitations  BLE heel raises on half foam roll 2x15 reps     Hip Abduction  Stengthening;Both;2 sets;10 reps    Abduction Limitations  red TB around feet       Knee/Hip Exercises: Seated   Long Arc Quad  Both;2 sets;Limitations;10 reps    Long Arc Quad Weight  10 lbs.    Other Seated Knee/Hip Exercises  B ankle circles x20 reps clockwise and counterclockwise     Marching  Both;2 sets;10 reps    Marching Weights  10 lbs.    Hamstring Curl  Both;Strengthening;2 sets;20 reps          Balance Exercises - 02/15/18 1300      Balance Exercises: Standing   Wall Bumps  Shoulder;Hip   x10 reps each, heavy cuing needed   Step Over  Hurdles / Cones  lateral step x10 reps each over half bolster no UE support    Other Standing Exercises  SLS with LE propped on 6" box and cross body reach x10 reps each direction with each LE forward         PT Education - 02/15/18 1319    Education Details  technique with therex    Person(s) Educated  Patient    Methods  Explanation;Verbal cues    Comprehension  Verbalized understanding;Returned demonstration       PT Short Term Goals - 02/03/18 1704      PT SHORT TERM GOAL #1   Title  Pt will be independent with his initial HEP to improve LE strength and balance.    Time  4    Period  Weeks    Status  Achieved      PT SHORT TERM GOAL #2   Title  Pt will demo improved gastroc strength, evident by his ability to complete atleast 5 single leg heel raises on each LE.    Baseline  5 on Lt, 7 on Rt     Time  4    Period  Weeks    Status  Achieved        PT Long Term Goals - 02/08/18 1238      PT LONG TERM GOAL #1   Title  Pt will be independent with advanced HEP to  decrease risk of deconditioning and future falls after discharge from PT.     Time  6    Period  Weeks    Status  On-going      PT LONG TERM GOAL #2   Title  Pt will complete 5x sit to stand in less than 14 sec without UE support, to reflect improvements in functional strength and power.     Baseline  15 sec using BUE, difficult without using UE    Time  6    Period  Weeks    Status  On-going      PT LONG TERM GOAL #3   Title  Pt will be able to complete the TUG in less than 13 sec, with LRAD, to reflect improvements in his balance and stability with ambulation in the community.     Baseline  14    Time  6    Period  Weeks    Status  On-going      PT LONG TERM GOAL #4   Title  Pt will have atleast 6 point increase in his BERG balance test to reflect a clinically significant improvement in his balance.     Baseline  32/56 to 47/56    Time  8    Period  Weeks    Status  Achieved      PT LONG TERM GOAL #5   Title  Improved Berg score to > or = to 50/56 for reduced risk of falls    Baseline  47/56 (01/20/18)    Time  6    Period  Weeks    Status  New            Plan - 02/15/18 1253    Clinical Impression Statement  Pt continues to work hard during his sessions. Continued with therex to increase LE strength and balance. He was able to complete lateral hurdle steps without UE support, but he did require close supervision for safety. Will continue with current POC and balance/strength progressions to improve safety at home and in  the community.     PT Treatment/Interventions  ADLs/Self Care Home Management;Moist Heat;Cryotherapy;Therapeutic activities;Functional mobility training;Stair training;Gait training;Therapeutic exercise;Neuromuscular re-education;Balance training;Patient/family education;Manual techniques;Passive range of motion    PT Next Visit Plan  KX modifier needeed; Hip and quad strength progression, ankle strength and flexiblility, dynamic balance with dual task  activities for improved coordination; progress HEP as able    PT Home Exercise Plan  Access Code: [email or web address]     Consulted and Agree with Plan of Care  Patient;Family member/caregiver    Family Member Consulted  Spouse       Patient will benefit from skilled therapeutic intervention in order to improve the following deficits and impairments:  Abnormal gait, Decreased activity tolerance, Decreased safety awareness, Decreased strength, Impaired flexibility, Postural dysfunction, Pain, Improper body mechanics, Decreased range of motion, Decreased endurance, Decreased balance, Decreased mobility, Difficulty walking, Hypomobility  Visit Diagnosis: Unsteadiness on feet  History of falling  Muscle weakness (generalized)  Difficulty in walking, not elsewhere classified     Problem List Patient Active Problem List   Diagnosis Date Noted  . Dupuytren's contracture 10/30/2017  . Frequent falls 10/30/2017  . History of closed head injury 07/25/2017  . Osteoarthritis, multiple sites 07/08/2016  . Risk for falls 07/08/2016  . Premature atrial contractions 05/11/2015  . Sepsis ([place]) 04/29/2015  . History of small bowel obstruction 04/29/2015  . History of traumatic brain injury 04/29/2015  . Acute respiratory failure ([place]) 04/29/2015  . CAP (community acquired pneumonia)   . Unspecified cerebral artery occlusion with cerebral infarction 08/25/2012  . Aphasia 08/25/2012  . Macular degeneration 04/25/2012  . CVA (cerebral infarction) 03/20/2012  . Small bowel obstruction ([place]) 09/12/2011  . Nausea & vomiting 09/12/2011  . Memory loss 04/01/2010  . 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 SKIN LESION 10/24/2009  . NEOPLASM, SKIN, UNCERTAIN BEHAVIOR 16/38/4536  . DYSGEUSIA 10/10/2009  . Hyperlipidemia 07/20/2008  . DEPRESSION 07/20/2008  . Essential hypertension 07/20/2008  . RHINITIS 07/20/2008  . COLONIC POLYPS, HX OF 07/20/2008  . GERD 07/18/2008  . PROSTATE CANCER, HX OF  07/18/2008   2:32 PM,02/15/18 Sherol Dade PT, DPT [place] [facility] [address] [place], [place], [address] Phone: [phone]   Fax:  [phone]  Name: Eric George MRN: [id number] Date of Birth: August 04, 1930

## 2018-02-17 ENCOUNTER — Encounter

## 2018-02-17 ENCOUNTER — Ambulatory Visit: Payer: Medicare Other | Admitting: Physical Therapy

## 2018-02-17 ENCOUNTER — Encounter: Payer: Self-pay | Admitting: Physical Therapy

## 2018-02-17 DIAGNOSIS — Z9181 History of falling: Secondary | ICD-10-CM | POA: Diagnosis not present

## 2018-02-17 DIAGNOSIS — M6281 Muscle weakness (generalized): Secondary | ICD-10-CM

## 2018-02-17 DIAGNOSIS — R262 Difficulty in walking, not elsewhere classified: Secondary | ICD-10-CM

## 2018-02-17 DIAGNOSIS — R2681 Unsteadiness on feet: Secondary | ICD-10-CM

## 2018-02-17 NOTE — Therapy (Signed)
[facility] [address] [place], [place], [address] Phone: [phone]   Fax:  [phone]  Physical Therapy Treatment  Patient Details  Name: Eric George MRN: [id number] Date of Birth: 08/12/1930 Referring Provider (PT): Carolann Littler, MD    Encounter Date: 02/17/2018  PT End of Session - 02/17/18 1445    Visit Number  22    Date for PT Re-Evaluation  03/07/18    Authorization Type  Medicare A and B    Authorization Time Period  01/25/18 to 03/07/18    Authorization - Visit Number  7    Authorization - Number of Visits  10    PT Start Time  1400    PT Stop Time  1443    PT Time Calculation (min)  43 min    Activity Tolerance  No increased pain;Patient tolerated treatment well    Behavior During Therapy  [facility] for tasks assessed/performed       Past Medical History:  Diagnosis Date  . Bowel obstruction ([place])   . CAP (community acquired pneumonia)   . 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 GERD (gastroesophageal reflux disease)   . Hydrocephalus ([place])   . Hyperlipidemia   . Hypertension   . Osteoarthritis, multiple sites 07/08/2016  . 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 Stroke ([place])   . TBI (traumatic brain injury) [facility])     Past Surgical History:  Procedure Laterality Date  . BRAIN SURGERY    . HERNIA REPAIR    . PROSTATECTOMY    . TEE WITHOUT CARDIOVERSION  03/22/2012   Procedure: TRANSESOPHAGEAL ECHOCARDIOGRAM (TEE);  Surgeon: [name], MD;  Location: [facility] ENDOSCOPY;  Service: Cardiovascular;  Laterality: N/A;    There were no vitals filed for this visit.  Subjective Assessment - 02/17/18 1401    Subjective  Pt's wife states things are going well. Pt is agreeable with this.     Patient is accompained by:  Family member    Pertinent History  HTN, TBI, stroke    Patient Stated Goals  decrease risk of falling     Currently in Pain?   No/denies                       [facility] Adult PT Treatment/Exercise - 02/17/18 0001      Exercises   Exercises  Knee/Hip      Knee/Hip Exercises: Stretches   Piriformis Stretch  Both;2 reps;30 seconds    Piriformis Stretch Limitations  seated       Knee/Hip Exercises: Aerobic   Nustep  x6 min intervals from L1 to L4 every 60 sec      Knee/Hip Exercises: Standing   Hip Abduction  Stengthening;Right;Left;1 set;10 reps    Abduction Limitations  red TB around feet, Lt completed last 5 reps without TB      Knee/Hip Exercises: Seated   Long Arc Quad  Both;2 sets;Limitations;15 reps    Long Arc Quad Weight  10 lbs.    Other Seated Knee/Hip Exercises  clam x20 reps each with black TB, decreased to 10 reps each for second set    Marching  2 sets;15 reps    Marching Weights  10 lbs.    Hamstring Curl  Both;Strengthening;2 sets;20 reps    Hamstring Limitations  double blue TB  Balance Exercises - 02/17/18 1435      Balance Exercises: Standing   Step Over Hurdles / Cones  lateral step over/back with intermittent UE support x10 reps each LE (increased difficulty on Rt)     Other Standing Exercises  SLS with LE propped on 6" box and cross body reach x10 reps each direction with each LE forward         PT Education - 02/17/18 1445    Education Details  updated HEP    Person(s) Educated  Patient;Spouse    Methods  Explanation;Verbal cues;Handout    Comprehension  Verbalized understanding;Returned demonstration       PT Short Term Goals - 02/03/18 1704      PT SHORT TERM GOAL #1   Title  Pt will be independent with his initial HEP to improve LE strength and balance.    Time  4    Period  Weeks    Status  Achieved      PT SHORT TERM GOAL #2   Title  Pt will demo improved gastroc strength, evident by his ability to complete atleast 5 single leg heel raises on each LE.    Baseline  5 on Lt, 7 on Rt     Time  4    Period  Weeks    Status  Achieved         PT Long Term Goals - 02/08/18 1238      PT LONG TERM GOAL #1   Title  Pt will be independent with advanced HEP to decrease risk of deconditioning and future falls after discharge from PT.     Time  6    Period  Weeks    Status  On-going      PT LONG TERM GOAL #2   Title  Pt will complete 5x sit to stand in less than 14 sec without UE support, to reflect improvements in functional strength and power.     Baseline  15 sec using BUE, difficult without using UE    Time  6    Period  Weeks    Status  On-going      PT LONG TERM GOAL #3   Title  Pt will be able to complete the TUG in less than 13 sec, with LRAD, to reflect improvements in his balance and stability with ambulation in the community.     Baseline  14    Time  6    Period  Weeks    Status  On-going      PT LONG TERM GOAL #4   Title  Pt will have atleast 6 point increase in his BERG balance test to reflect a clinically significant improvement in his balance.     Baseline  32/56 to 47/56    Time  8    Period  Weeks    Status  Achieved      PT LONG TERM GOAL #5   Title  Improved Berg score to > or = to 50/56 for reduced risk of falls    Baseline  47/56 (01/20/18)    Time  6    Period  Weeks    Status  New            Plan - 02/17/18 1445    Clinical Impression Statement  Today's session focused on therex progressions for LE strength. Pt did demonstrate LLE fatigue with hip abduction and seated clams but denied pain with this. Also addressed single leg stability  and endurance with trunk rotation and pt had to use intermittent UE support secondary to LE fatigue. Ended with HEP updates and pt verbalized understanding.     PT Treatment/Interventions  ADLs/Self Care Home Management;Moist Heat;Cryotherapy;Therapeutic activities;Functional mobility training;Stair training;Gait training;Therapeutic exercise;Neuromuscular re-education;Balance training;Patient/family education;Manual techniques;Passive range of motion     PT Next Visit Plan  KX modifier needeed; Hip and quad strength progression, ankle strength and flexiblility, dynamic balance with dual task activities for improved coordination; progress HEP as able    PT Home Exercise Plan  Access Code: [email or web address]     Consulted and Agree with Plan of Care  Patient;Family member/caregiver    Family Member Consulted  Spouse       Patient will benefit from skilled therapeutic intervention in order to improve the following deficits and impairments:  Abnormal gait, Decreased activity tolerance, Decreased safety awareness, Decreased strength, Impaired flexibility, Postural dysfunction, Pain, Improper body mechanics, Decreased range of motion, Decreased endurance, Decreased balance, Decreased mobility, Difficulty walking, Hypomobility  Visit Diagnosis: Unsteadiness on feet  History of falling  Muscle weakness (generalized)  Difficulty in walking, not elsewhere classified     Problem List Patient Active Problem List   Diagnosis Date Noted  . Dupuytren's contracture 10/30/2017  . Frequent falls 10/30/2017  . History of closed head injury 07/25/2017  . Osteoarthritis, multiple sites 07/08/2016  . Risk for falls 07/08/2016  . Premature atrial contractions 05/11/2015  . Sepsis ([place]) 04/29/2015  . History of small bowel obstruction 04/29/2015  . History of traumatic brain injury 04/29/2015  . Acute respiratory failure ([place]) 04/29/2015  . CAP (community acquired pneumonia)   . Unspecified cerebral artery occlusion with cerebral infarction 08/25/2012  . Aphasia 08/25/2012  . Macular degeneration 04/25/2012  . CVA (cerebral infarction) 03/20/2012  . Small bowel obstruction ([place]) 09/12/2011  . Nausea & vomiting 09/12/2011  . Memory loss 04/01/2010  . 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 SKIN LESION 10/24/2009  . NEOPLASM, SKIN, UNCERTAIN BEHAVIOR 57/90/3833  . DYSGEUSIA 10/10/2009  . Hyperlipidemia 07/20/2008  . DEPRESSION 07/20/2008  .  Essential hypertension 07/20/2008  . RHINITIS 07/20/2008  . COLONIC POLYPS, HX OF 07/20/2008  . GERD 07/18/2008  . PROSTATE CANCER, HX OF 07/18/2008     2:47 PM,02/17/18 Sherol Dade PT, DPT [place] [facility] [address] [place], [place], [address] Phone: [phone]   Fax:  [phone]  Name: Eric George MRN: [id number] Date of Birth: 07-Jul-1930

## 2018-02-17 NOTE — Patient Instructions (Signed)
Access Code: [email or web address]  URL: [email or web address]  Date: 02/17/2018  Prepared by: Sherol Dade   Exercises  Heel Raise - 20 reps - 1 sets - 3x daily - 7x weekly  Clamshell with Resistance - 10 reps - 3 sets - 2x daily - 7x weekly  Toe Raises with Counter Support - 20 reps - 1 sets - 2x daily - 7x weekly  Supine Bridge - 10 reps - 3 sets - 1x daily - 7x weekly  Side Stepping with Resistance at Feet - 10 reps - 3 sets - 1x daily - 7x weekly  Seated Hip Abduction - 10-15 reps - 2 sets - 1x daily - 7x weekly   [facility] [address], [place], [place] [address] Phone # [phone] Fax [phone]

## 2018-02-20 ENCOUNTER — Other Ambulatory Visit: Payer: Self-pay | Admitting: Family Medicine

## 2018-02-21 ENCOUNTER — Other Ambulatory Visit: Payer: Self-pay

## 2018-02-21 ENCOUNTER — Ambulatory Visit (INDEPENDENT_AMBULATORY_CARE_PROVIDER_SITE_OTHER): Payer: Medicare Other | Admitting: Family Medicine

## 2018-02-21 ENCOUNTER — Encounter: Payer: Self-pay | Admitting: Family Medicine

## 2018-02-21 VITALS — BP 118/76 | HR 93 | Temp 97.5°F | Ht 74.0 in | Wt 182.8 lb

## 2018-02-21 DIAGNOSIS — R296 Repeated falls: Secondary | ICD-10-CM

## 2018-02-21 DIAGNOSIS — R05 Cough: Secondary | ICD-10-CM

## 2018-02-21 DIAGNOSIS — I10 Essential (primary) hypertension: Secondary | ICD-10-CM

## 2018-02-21 DIAGNOSIS — K219 Gastro-esophageal reflux disease without esophagitis: Secondary | ICD-10-CM

## 2018-02-21 DIAGNOSIS — Z23 Encounter for immunization: Secondary | ICD-10-CM

## 2018-02-21 DIAGNOSIS — R059 Cough, unspecified: Secondary | ICD-10-CM

## 2018-02-21 DIAGNOSIS — Z87820 Personal history of traumatic brain injury: Secondary | ICD-10-CM
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
## 2018-02-24 ENCOUNTER — Ambulatory Visit: Payer: Medicare Other | Admitting: Physical Therapy

## 2018-02-24 ENCOUNTER — Encounter: Payer: Self-pay | Admitting: Physical Therapy

## 2018-02-24 DIAGNOSIS — Z9181 History of falling: Secondary | ICD-10-CM

## 2018-02-24 DIAGNOSIS — R262 Difficulty in walking, not elsewhere classified: Secondary | ICD-10-CM

## 2018-02-24 DIAGNOSIS — R2681 Unsteadiness on feet: Secondary | ICD-10-CM | POA: Diagnosis not present

## 2018-02-24 DIAGNOSIS — M6281 Muscle weakness (generalized): Secondary | ICD-10-CM

## 2018-02-24 NOTE — Therapy (Signed)
[facility] [address], [place], [place], [address] Phone: [phone]   Fax:  [phone]  Physical Therapy Treatment  Patient Details  Name: Eric George MRN: [id number] Date of Birth: 1930/08/28 Referring Provider (PT): Carolann Littler, MD    Encounter Date: 02/24/2018  PT End of Session - 02/24/18 1228    Visit Number  23    Date for PT Re-Evaluation  03/07/18    Authorization Type  Medicare A and B    Authorization Time Period  01/25/18 to 03/07/18    Authorization - Visit Number  8    Authorization - Number of Visits  10    PT Start Time  1225    PT Stop Time  7494    PT Time Calculation (min)  40 min    Activity Tolerance  No increased pain;Patient tolerated treatment well    Behavior During Therapy  [facility] for tasks assessed/performed       Past Medical History:  Diagnosis Date  . Bowel obstruction ([place])   . CAP (community acquired pneumonia)   . 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 GERD (gastroesophageal reflux disease)   . Hydrocephalus ([place])   . Hyperlipidemia   . Hypertension   . Osteoarthritis, multiple sites 07/08/2016  . 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 Stroke ([place])   . TBI (traumatic brain injury) [facility])     Past Surgical History:  Procedure Laterality Date  . BRAIN SURGERY    . HERNIA REPAIR    . PROSTATECTOMY    . TEE WITHOUT CARDIOVERSION  03/22/2012   Procedure: TRANSESOPHAGEAL ECHOCARDIOGRAM (TEE);  Surgeon: [name], MD;  Location: [facility] ENDOSCOPY;  Service: Cardiovascular;  Laterality: N/A;    There were no vitals filed for this visit.  Subjective Assessment - 02/24/18 1227    Subjective  Pt states things are going well. He is still exercising at home.     Patient is accompained by:  Family member    Pertinent History  HTN, TBI, stroke    Patient Stated Goals  decrease risk of falling     Currently in Pain?  No/denies                        [facility] Adult PT Treatment/Exercise - 02/24/18 0001      Exercises   Exercises  Knee/Hip      Knee/Hip Exercises: Stretches   Passive Hamstring Stretch  2 reps;Both;30 seconds    Passive Hamstring Stretch Limitations  LE on step     Piriformis Stretch  Both;2 reps;30 seconds    Piriformis Stretch Limitations  seated       Knee/Hip Exercises: Aerobic   Nustep  x6 min intervals from L1 to L4 every 60 sec for LE endurance      Knee/Hip Exercises: Standing   Heel Raises  Both;2 sets;20 reps   on half foam roll    Hip Abduction  Stengthening;Both;2 sets;10 reps    Abduction Limitations  red TB around ankles       Knee/Hip Exercises: Seated   Long Arc Quad  Both;2 sets;Limitations;15 reps    Long Arc Quad Weight  10 lbs.    Other Seated Knee/Hip Exercises  BLE clams with black TB 2x20 reps y    Marching  2 sets;15 reps    Marching Weights  10 lbs.  Balance Exercises - 02/24/18 1233      Balance Exercises: Standing   Tandem Stance  Eyes open;30 secs;2 reps   increased difficulty with RLE forward    Standing, One Foot on a Step  Eyes open;6 inch   trunk rotation x10 reps; each LE on step with eyes closed 3x   Other Standing Exercises  SLS Rt x12 sec, Lt 3 sec         PT Education - 02/24/18 1302    Education Details  technique with therex    Person(s) Educated  Patient    Methods  Explanation;Verbal cues;Tactile cues    Comprehension  Verbalized understanding;Returned demonstration       PT Short Term Goals - 02/24/18 1309      PT SHORT TERM GOAL #1   Title  Pt will be independent with his initial HEP to improve LE strength and balance.    Time  4    Period  Weeks    Status  Achieved      PT SHORT TERM GOAL #2   Title  Pt will demo improved gastroc strength, evident by his ability to complete atleast 5 single leg heel raises on each LE.    Baseline  3 on Lt, 15 on Rt     Time  4    Period  Weeks    Status   Achieved        PT Long Term Goals - 02/08/18 1238      PT LONG TERM GOAL #1   Title  Pt will be independent with advanced HEP to decrease risk of deconditioning and future falls after discharge from PT.     Time  6    Period  Weeks    Status  On-going      PT LONG TERM GOAL #2   Title  Pt will complete 5x sit to stand in less than 14 sec without UE support, to reflect improvements in functional strength and power.     Baseline  15 sec using BUE, difficult without using UE    Time  6    Period  Weeks    Status  On-going      PT LONG TERM GOAL #3   Title  Pt will be able to complete the TUG in less than 13 sec, with LRAD, to reflect improvements in his balance and stability with ambulation in the community.     Baseline  14    Time  6    Period  Weeks    Status  On-going      PT LONG TERM GOAL #4   Title  Pt will have atleast 6 point increase in his BERG balance test to reflect a clinically significant improvement in his balance.     Baseline  32/56 to 47/56    Time  8    Period  Weeks    Status  Achieved      PT LONG TERM GOAL #5   Title  Improved Berg score to > or = to 50/56 for reduced risk of falls    Baseline  47/56 (01/20/18)    Time  6    Period  Weeks    Status  New            Plan - 02/24/18 1306    Clinical Impression Statement  Pt demonstrates improved single leg stance on the Rt evident by his ability to maintain for 15 sec without LOB. His LLE  weakness continues to be a limiting factor in his LLE balance, as he was only able to maintain for 3 sec after multiple tries. Continued with balance activity and therex to promote LE strength. He had expected LE fatigue end of session, and required his [facility] for additional stability when leaving the clinic. Will continue with current POC to progress LE strength, flexibility, and proprioception for decreased risk of falls.    PT Treatment/Interventions  ADLs/Self Care Home Management;Moist Heat;Cryotherapy;Therapeutic  activities;Functional mobility training;Stair training;Gait training;Therapeutic exercise;Neuromuscular re-education;Balance training;Patient/family education;Manual techniques;Passive range of motion    PT Next Visit Plan  KX modifier needeed; Hip and quad strength progression, ankle strength and flexiblility, dynamic balance with dual task activities for improved coordination; progress HEP as able    PT Home Exercise Plan  Access Code: [email or web address]     Consulted and Agree with Plan of Care  Patient;Family member/caregiver    Family Member Consulted  Spouse       Patient will benefit from skilled therapeutic intervention in order to improve the following deficits and impairments:  Abnormal gait, Decreased activity tolerance, Decreased safety awareness, Decreased strength, Impaired flexibility, Postural dysfunction, Pain, Improper body mechanics, Decreased range of motion, Decreased endurance, Decreased balance, Decreased mobility, Difficulty walking, Hypomobility  Visit Diagnosis: Unsteadiness on feet  History of falling  Muscle weakness (generalized)  Difficulty in walking, not elsewhere classified     Problem List Patient Active Problem List   Diagnosis Date Noted  . Dupuytren's contracture 10/30/2017  . Frequent falls 10/30/2017  . History of closed head injury 07/25/2017  . Osteoarthritis, multiple sites 07/08/2016  . Risk for falls 07/08/2016  . Premature atrial contractions 05/11/2015  . Sepsis ([place]) 04/29/2015  . History of small bowel obstruction 04/29/2015  . History of traumatic brain injury 04/29/2015  . Acute respiratory failure ([place]) 04/29/2015  . CAP (community acquired pneumonia)   . Unspecified cerebral artery occlusion with cerebral infarction 08/25/2012  . Aphasia 08/25/2012  . Macular degeneration 04/25/2012  . CVA (cerebral infarction) 03/20/2012  . Small bowel obstruction ([place]) 09/12/2011  . Nausea & vomiting 09/12/2011  . Memory loss 04/01/2010  .  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 SKIN LESION 10/24/2009  . NEOPLASM, SKIN, UNCERTAIN BEHAVIOR 02/58/5277  . DYSGEUSIA 10/10/2009  . Hyperlipidemia 07/20/2008  . DEPRESSION 07/20/2008  . Essential hypertension 07/20/2008  . RHINITIS 07/20/2008  . COLONIC POLYPS, HX OF 07/20/2008  . GERD 07/18/2008  . PROSTATE CANCER, HX OF 07/18/2008     1:10 PM,02/24/18 Sherol Dade PT, DPT [place] at [facility] [address] [place], [place], [address] Phone: [phone]   Fax:  [phone]  Name: Eric George MRN: [id number] Date of Birth: [birth year]

## 2018-03-01 ENCOUNTER — Ambulatory Visit: Payer: Medicare Other | Admitting: Physical Therapy

## 2018-03-01 DIAGNOSIS — Z9181 History of falling: Secondary | ICD-10-CM

## 2018-03-01 DIAGNOSIS — R2681 Unsteadiness on feet: Secondary | ICD-10-CM

## 2018-03-01 DIAGNOSIS — R262 Difficulty in walking, not elsewhere classified: Secondary | ICD-10-CM | POA: Diagnosis not present

## 2018-03-01 DIAGNOSIS — M6281 Muscle weakness (generalized): Secondary | ICD-10-CM

## 2018-03-01 NOTE — Therapy (Signed)
[facility] [address], [place], [place], [address] Phone: [phone]   Fax:  [phone]  Physical Therapy Treatment  Patient Details  Name: Eric George MRN: [id number] Date of Birth: [birth year] Referring Provider (PT): Carolann Littler, MD    Encounter Date: 03/01/2018  PT End of Session - 03/01/18 1641    Visit Number  24    Date for PT Re-Evaluation  03/07/18    Authorization Type  Medicare A and B; [place] - Visit Number  9    Authorization - Number of Visits  10    PT Start Time  0623    PT Stop Time  7628    PT Time Calculation (min)  40 min    Activity Tolerance  No increased pain;Patient tolerated treatment well    Behavior During Therapy  [facility] for tasks assessed/performed       Past Medical History:  Diagnosis Date  . Bowel obstruction ([place])   . CAP (community acquired pneumonia)   . 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 GERD (gastroesophageal reflux disease)   . Hydrocephalus ([place])   . Hyperlipidemia   . Hypertension   . Osteoarthritis, multiple sites 07/08/2016  . 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 Stroke ([place])   . TBI (traumatic brain injury) [facility])     Past Surgical History:  Procedure Laterality Date  . BRAIN SURGERY    . HERNIA REPAIR    . PROSTATECTOMY    . TEE WITHOUT CARDIOVERSION  03/22/2012   Procedure: TRANSESOPHAGEAL ECHOCARDIOGRAM (TEE);  Surgeon: [name], MD;  Location: [facility] ENDOSCOPY;  Service: Cardiovascular;  Laterality: N/A;    There were no vitals filed for this visit.  Subjective Assessment - 03/01/18 1703    Subjective  I feel as good as to be expected at this time of day.    Patient is accompained by:  Family member    Patient Stated Goals  decrease risk of falling     Currently in Pain?  No/denies         [facility] PT Assessment - 03/01/18 0001      Standardized Balance Assessment   Standardized Balance Assessment  Five Times Sit to Stand    Five times sit to stand comments   16 sec, 18 sec      Timed Up and Go Test   TUG Comments  2 attempts of 13, 14 - 13.5 average                   [facility] Adult PT Treatment/Exercise - 03/01/18 0001      Knee/Hip Exercises: Aerobic   Nustep  L2 x 3 min, L3x5min, L4 x 39min, L2x 3 min      Knee/Hip Exercises: Standing   Heel Raises  Both;2 sets;20 reps   on black pad and half foam roll   Hip Abduction  Stengthening;Both;2 sets;10 reps    Abduction Limitations  red TB around ankles       Knee/Hip Exercises: Seated   Long Arc Quad  Both;2 sets;Limitations;15 reps    Long Arc Quad Weight  10 lbs.    Other Seated Knee/Hip Exercises  BLE clams with black TB 2x20 reps y   single leg              PT Short Term Goals - 02/24/18 1309  PT SHORT TERM GOAL #1   Title  Pt will be independent with his initial HEP to improve LE strength and balance.    Time  4    Period  Weeks    Status  Achieved      PT SHORT TERM GOAL #2   Title  Pt will demo improved gastroc strength, evident by his ability to complete atleast 5 single leg heel raises on each LE.    Baseline  3 on Lt, 15 on Rt     Time  4    Period  Weeks    Status  Achieved        PT Long Term Goals - 03/01/18 1647      PT LONG TERM GOAL #1   Title  Pt will be independent with advanced HEP to decrease risk of deconditioning and future falls after discharge from PT.     Status  On-going      PT LONG TERM GOAL #2   Title  Pt will complete 5x sit to stand in less than 14 sec without UE support, to reflect improvements in functional strength and power.     Status  On-going      PT LONG TERM GOAL #3   Title  Pt will be able to complete the TUG in less than 13 sec, with LRAD, to reflect improvements in his balance and stability with ambulation in the community.     Baseline  13.5    Status  On-going            Plan - 03/01/18 1646     Clinical Impression Statement  Pt appeared to be more fatigued today possibly due to later appointment time.  He did well with exercises and no reports of increased pain. Pt did not improve 5x sit to stand but it was done at the end of the session.  He did slightly improve TUG but still not quite at goal.  Pt demonstrates gradual improvements and will benefit from skilled PT to continue working on strength and balance.    PT Treatment/Interventions  ADLs/Self Care Home Management;Moist Heat;Cryotherapy;Therapeutic activities;Functional mobility training;Stair training;Gait training;Therapeutic exercise;Neuromuscular re-education;Balance training;Patient/family education;Manual techniques;Passive range of motion    PT Next Visit Plan  KX modifier needeed; Hip and quad strength progression, ankle strength and flexiblility, dynamic balance with dual task activities for improved coordination; progress HEP as able    PT Home Exercise Plan  Access Code: [email or web address]     Consulted and Agree with Plan of Care  Patient;Family member/caregiver    Family Member Consulted  Spouse       Patient will benefit from skilled therapeutic intervention in order to improve the following deficits and impairments:  Abnormal gait, Decreased activity tolerance, Decreased safety awareness, Decreased strength, Impaired flexibility, Postural dysfunction, Pain, Improper body mechanics, Decreased range of motion, Decreased endurance, Decreased balance, Decreased mobility, Difficulty walking, Hypomobility  Visit Diagnosis: Unsteadiness on feet  History of falling  Muscle weakness (generalized)  Difficulty in walking, not elsewhere classified     Problem List Patient Active Problem List   Diagnosis Date Noted  . Dupuytren's contracture 10/30/2017  . Frequent falls 10/30/2017  . History of closed head injury 07/25/2017  . Osteoarthritis, multiple sites 07/08/2016  . Risk for falls 07/08/2016  . Premature atrial  contractions 05/11/2015  . Sepsis ([place]) 04/29/2015  . History of small bowel obstruction 04/29/2015  . History of traumatic brain injury 04/29/2015  . Acute respiratory failure ([place]) 04/29/2015  .  CAP (community acquired pneumonia)   . Unspecified cerebral artery occlusion with cerebral infarction 08/25/2012  . Aphasia 08/25/2012  . Macular degeneration 04/25/2012  . CVA (cerebral infarction) 03/20/2012  . Small bowel obstruction ([place]) 09/12/2011  . Nausea & vomiting 09/12/2011  . Memory loss 04/01/2010  . 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 SKIN LESION 10/24/2009  . NEOPLASM, SKIN, UNCERTAIN BEHAVIOR 40/45/9136  . DYSGEUSIA 10/10/2009  . Hyperlipidemia 07/20/2008  . DEPRESSION 07/20/2008  . Essential hypertension 07/20/2008  . RHINITIS 07/20/2008  . COLONIC POLYPS, HX OF 07/20/2008  . GERD 07/18/2008  . PROSTATE CANCER, HX OF 07/18/2008    Wayne Both 03/01/2018, 5:03 PM  [facility] [address] [place], [place], [address] Phone: [phone]   Fax:  [phone]  Name: Eric George MRN: [id number] Date of Birth: May 30, 1930

## 2018-03-03 ENCOUNTER — Ambulatory Visit: Payer: Medicare Other | Admitting: Physical Therapy

## 2018-03-03 ENCOUNTER — Encounter: Payer: Self-pay | Admitting: Physical Therapy

## 2018-03-03 DIAGNOSIS — Z9181 History of falling: Secondary | ICD-10-CM

## 2018-03-03 DIAGNOSIS — R262 Difficulty in walking, not elsewhere classified: Secondary | ICD-10-CM | POA: Diagnosis not present

## 2018-03-03 DIAGNOSIS — R2681 Unsteadiness on feet: Secondary | ICD-10-CM | POA: Diagnosis not present

## 2018-03-03 DIAGNOSIS — M6281 Muscle weakness (generalized): Secondary | ICD-10-CM

## 2018-03-03 NOTE — Therapy (Signed)
[facility] [address], [place], [place], [address] Phone: [phone]   Fax:  [phone]  Physical Therapy Treatment  Patient Details  Name: Eric George MRN: [id number] Date of Birth: [birth year] Referring Provider (PT): Carolann Littler, MD   Progress Note Reporting Period 01/27/18 to 03/03/18  See note below for Objective Data and Assessment of Progress/Goals.      Encounter Date: 03/03/2018  PT End of Session - 03/03/18 1539    Visit Number  25    Date for PT Re-Evaluation  03/07/18    Authorization Type  Medicare A and B; [place] - Visit Number  10    Authorization - Number of Visits  10    PT Start Time  1027    PT Stop Time  1610    PT Time Calculation (min)  40 min    Activity Tolerance  No increased pain;Patient tolerated treatment well    Behavior During Therapy  [facility] for tasks assessed/performed       Past Medical History:  Diagnosis Date  . Bowel obstruction ([place])   . CAP (community acquired pneumonia)   . 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 GERD (gastroesophageal reflux disease)   . Hydrocephalus ([place])   . Hyperlipidemia   . Hypertension   . Osteoarthritis, multiple sites 07/08/2016  . 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 Stroke ([place])   . TBI (traumatic brain injury) [facility])     Past Surgical History:  Procedure Laterality Date  . BRAIN SURGERY    . HERNIA REPAIR    . PROSTATECTOMY    . TEE WITHOUT CARDIOVERSION  03/22/2012   Procedure: TRANSESOPHAGEAL ECHOCARDIOGRAM (TEE);  Surgeon: [name], MD;  Location: [facility];  Service: Cardiovascular;  Laterality: N/A;    There were no vitals filed for this visit.  Subjective Assessment - 03/03/18 1531    Subjective  Pt states things are going well. He has not had any falls recently.     Patient is accompained by:  Family member    Patient Stated Goals  decrease  risk of falling     Currently in Pain?  No/denies         [facility] PT Assessment - 03/03/18 0001      Berg Balance Test   Sit to Stand  Able to stand without using hands and stabilize independently    Standing Unsupported  Able to stand safely 2 minutes    Sitting with Back Unsupported but Feet Supported on Floor or Stool  Able to sit safely and securely 2 minutes    Stand to Sit  Sits safely with minimal use of hands    Transfers  Able to transfer safely, minor use of hands    Standing Unsupported with Eyes Closed  Able to stand 10 seconds safely    Standing Ubsupported with Feet Together  Able to place feet together independently and stand 1 minute safely    From Standing, Reach Forward with Outstretched Arm  Can reach confidently >25 cm (10")    From Standing Position, Pick up Object from Floor  Able to pick up shoe safely and easily    From Standing Position, Turn to Look Behind Over each Shoulder  Looks behind from both sides and weight shifts well    Turn 360 Degrees  Able to turn 360 degrees safely in 4  seconds or less    Standing Unsupported, Alternately Place Feet on Step/Stool  Able to stand independently and safely and complete 8 steps in 20 seconds    Standing Unsupported, One Foot in Front  Able to plae foot ahead of the other independently and hold 30 seconds   step tandem indep and hold 10 sec    Standing on One Leg  Tries to lift leg/unable to hold 3 seconds but remains standing independently    Total Score  52                   [facility] Adult PT Treatment/Exercise - 03/03/18 0001      Knee/Hip Exercises: Stretches   Piriformis Stretch  2 reps;Both;30 seconds    Piriformis Stretch Limitations  seated       Knee/Hip Exercises: Aerobic   Nustep  intervals from L2 to L5 every 60 sec       Knee/Hip Exercises: Standing   Heel Raises  Both;1 set;20 reps    Heel Raises Limitations  half foam roll     Hip Abduction  Stengthening;Both;2 sets;10 reps    Abduction  Limitations  red TB around ankles     Other Standing Knee Exercises  hip hike/lower from step 2x15 reps each LE     Other Standing Knee Exercises  straight leg hip extension with yellow TB around ankles 2x10 reps              PT Education - 03/03/18 1607    Education Details  improvements in balance nad possible plateau with this; importance of gaining independence with HEP to ensure progress is maintained    Person(s) Educated  Patient;Spouse    Methods  Explanation    Comprehension  Verbalized understanding       PT Short Term Goals - 03/03/18 1703      PT SHORT TERM GOAL #1   Title  Pt will be independent with his initial HEP to improve LE strength and balance.    Time  4    Period  Weeks    Status  Achieved      PT SHORT TERM GOAL #2   Title  Pt will demo improved gastroc strength, evident by his ability to complete atleast 5 single leg heel raises on each LE.    Baseline  3 on Lt, 15 on Rt     Time  4    Period  Weeks    Status  Achieved        PT Long Term Goals - 03/03/18 1609      PT LONG TERM GOAL #1   Title  Pt will be independent with advanced HEP to decrease risk of deconditioning and future falls after discharge from PT.     Status  On-going      PT LONG TERM GOAL #2   Title  Pt will complete 5x sit to stand in less than 14 sec without UE support, to reflect improvements in functional strength and power.     Status  On-going      PT LONG TERM GOAL #3   Title  Pt will be able to complete the TUG in less than 13 sec, with LRAD, to reflect improvements in his balance and stability with ambulation in the community.     Baseline  13.5    Status  On-going      PT LONG TERM GOAL #4   Title  Pt will have atleast 6  point increase in his BERG balance test to reflect a clinically significant improvement in his balance.     Baseline  32/56 to 47/56 to 52/56     Status  Achieved            Plan - 03/03/18 1701    Clinical Impression Statement  Pt is  making steady progress towards his goals. He scored 52/56 on the Berg balance test this visit, which is a 20point improvement overall and atleast 5 points greater than at his most recent assessment. He does demonstrate improved steadiness during static activity and during his sessions, however his wife notes that his walking around the home is still an issue. He could benefit from DGI assessment next visit to determine his safety with dynamic activity.     PT Treatment/Interventions  ADLs/Self Care Home Management;Moist Heat;Cryotherapy;Therapeutic activities;Functional mobility training;Stair training;Gait training;Therapeutic exercise;Neuromuscular re-education;Balance training;Patient/family education;Manual techniques;Passive range of motion    PT Next Visit Plan  KX modifier needeed; Hip and quad strength progression, ankle strength and flexiblility, dynamic balance with dual task activities for improved coordination; progress HEP as able    PT Home Exercise Plan  Access Code: [email or web address]     Consulted and Agree with Plan of Care  Patient;Family member/caregiver    Family Member Consulted  Spouse       Patient will benefit from skilled therapeutic intervention in order to improve the following deficits and impairments:  Abnormal gait, Decreased activity tolerance, Decreased safety awareness, Decreased strength, Impaired flexibility, Postural dysfunction, Pain, Improper body mechanics, Decreased range of motion, Decreased endurance, Decreased balance, Decreased mobility, Difficulty walking, Hypomobility  Visit Diagnosis: Unsteadiness on feet  History of falling  Muscle weakness (generalized)  Difficulty in walking, not elsewhere classified     Problem List Patient Active Problem List   Diagnosis Date Noted  . Dupuytren's contracture 10/30/2017  . Frequent falls 10/30/2017  . History of closed head injury 07/25/2017  . Osteoarthritis, multiple sites 07/08/2016  . Risk for falls  07/08/2016  . Premature atrial contractions 05/11/2015  . Sepsis ([place]) 04/29/2015  . History of small bowel obstruction 04/29/2015  . History of traumatic brain injury 04/29/2015  . Acute respiratory failure ([place]) 04/29/2015  . CAP (community acquired pneumonia)   . Unspecified cerebral artery occlusion with cerebral infarction 08/25/2012  . Aphasia 08/25/2012  . Macular degeneration 04/25/2012  . CVA (cerebral infarction) 03/20/2012  . Small bowel obstruction ([place]) 09/12/2011  . Nausea & vomiting 09/12/2011  . Memory loss 04/01/2010  . 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 SKIN LESION 10/24/2009  . NEOPLASM, SKIN, UNCERTAIN BEHAVIOR 81/27/5170  . DYSGEUSIA 10/10/2009  . Hyperlipidemia 07/20/2008  . DEPRESSION 07/20/2008  . Essential hypertension 07/20/2008  . RHINITIS 07/20/2008  . COLONIC POLYPS, HX OF 07/20/2008  . GERD 07/18/2008  . PROSTATE CANCER, HX OF 07/18/2008     5:05 PM,03/03/18 Sherol Dade PT, DPT [facility] [address] [place], [place], [address] Phone: [phone]   Fax:  [phone]  Name: Eric George MRN: [id number] Date of Birth: 12/01/1930

## 2018-03-03 NOTE — Patient Instructions (Signed)
Access Code: [email or web address]  URL: [email or web address]  Date: 03/03/2018  Prepared by: Sherol Dade   Exercises  Heel Raise - 20 reps - 1 sets - 3x daily - 7x weekly  Clamshell with Resistance - 10 reps - 3 sets - 2x daily - 7x weekly  Toe Raises with Counter Support - 20 reps - 1 sets - 2x daily - 7x weekly  Side Stepping with Resistance at Feet - 10 reps - 3 sets - 1x daily - 7x weekly  Tandem Stance - 10 reps - 3 sets - 1x daily - 7x weekly  Seated Hip Abduction - 10-15 reps - 2 sets - 1x daily - 7x weekly  Hip Extension with Resistance Loop - 10 reps - 3 sets - 1x daily - 7x weekly    [facility] [address] [place], [place] [address] Phone # [phone] Fax [phone]

## 2018-03-07 ENCOUNTER — Ambulatory Visit: Payer: Medicare Other | Attending: Family Medicine | Admitting: Physical Therapy

## 2018-03-07 ENCOUNTER — Encounter: Payer: Self-pay | Admitting: Physical Therapy

## 2018-03-07 DIAGNOSIS — Z9181 History of falling: Secondary | ICD-10-CM | POA: Diagnosis not present

## 2018-03-07 DIAGNOSIS — M6281 Muscle weakness (generalized): Secondary | ICD-10-CM | POA: Insufficient documentation

## 2018-03-07 DIAGNOSIS — R2681 Unsteadiness on feet: Secondary | ICD-10-CM

## 2018-03-07 DIAGNOSIS — R262 Difficulty in walking, not elsewhere classified: Secondary | ICD-10-CM | POA: Insufficient documentation

## 2018-03-07 NOTE — Therapy (Signed)
[facility] [address], [place], [place], [address] Phone: [phone]   Fax:  [phone]  Physical Therapy Treatment  Patient Details  Name: Eric George MRN: [id number] Date of Birth: Jan 29, 1931 Referring Provider (PT): Carolann Littler, MD    Encounter Date: 03/07/2018  PT End of Session - 03/07/18 1139    Visit Number  26    Date for PT Re-Evaluation  03/07/18    Authorization Type  Medicare A and B; [place] - Visit Number  1    Authorization - Number of Visits  10    PT Start Time  8366    PT Stop Time  1229    PT Time Calculation (min)  45 min    Activity Tolerance  No increased pain;Patient tolerated treatment well    Behavior During Therapy  [facility] for tasks assessed/performed       Past Medical History:  Diagnosis Date  . Bowel obstruction ([place])   . CAP (community acquired pneumonia)   . 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 GERD (gastroesophageal reflux disease)   . Hydrocephalus ([place])   . Hyperlipidemia   . Hypertension   . Osteoarthritis, multiple sites 07/08/2016  . 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 Stroke ([place])   . TBI (traumatic brain injury) [facility])     Past Surgical History:  Procedure Laterality Date  . BRAIN SURGERY    . HERNIA REPAIR    . PROSTATECTOMY    . TEE WITHOUT CARDIOVERSION  03/22/2012   Procedure: TRANSESOPHAGEAL ECHOCARDIOGRAM (TEE);  Surgeon: [name], MD;  Location: [facility] ENDOSCOPY;  Service: Cardiovascular;  Laterality: N/A;    There were no vitals filed for this visit.  Subjective Assessment - 03/07/18 1511    Subjective  Pt reports he is doing okay today. Pt's spouse/caregiver reports he is doing much better than prior to starting therapy.    Patient is accompained by:  Family member    Patient Stated Goals  decrease risk of falling     Currently in Pain?  No/denies         [facility] PT  Assessment - 03/07/18 0001      Berg Balance Test   Sit to Stand  Able to stand without using hands and stabilize independently    Standing Unsupported  Able to stand safely 2 minutes    Sitting with Back Unsupported but Feet Supported on Floor or Stool  Able to sit safely and securely 2 minutes    Stand to Sit  Sits safely with minimal use of hands    Transfers  Able to transfer safely, minor use of hands    Standing Unsupported with Eyes Closed  Able to stand 10 seconds safely    Standing Ubsupported with Feet Together  Able to place feet together independently and stand 1 minute safely    From Standing, Reach Forward with Outstretched Arm  Can reach forward >12 cm safely (5")    From Standing Position, Pick up Object from Floor  Able to pick up shoe safely and easily    From Standing Position, Turn to Look Behind Over each Shoulder  Looks behind from both sides and weight shifts well    Turn 360 Degrees  Able to turn 360 degrees safely but slowly    Standing Unsupported, Alternately Place Feet on Step/Stool  Able to stand independently  and safely and complete 8 steps in 20 seconds    Standing Unsupported, One Foot in Front  Able to plae foot ahead of the other independently and hold 30 seconds    Standing on One Leg  Tries to lift leg/unable to hold 3 seconds but remains standing independently    Total Score  49      Dynamic Gait Index   Level Surface  Mild Impairment    Change in Gait Speed  Moderate Impairment    Gait with Horizontal Head Turns  Moderate Impairment    Gait with Vertical Head Turns  Moderate Impairment    Gait and Pivot Turn  Normal    Step Over Obstacle  Moderate Impairment    Step Around Obstacles  Moderate Impairment    Steps  Mild Impairment    Total Score  12      Timed Up and Go Test   TUG Comments  3 attempts, 17, 13, 12                   [facility] Adult PT Treatment/Exercise - 03/07/18 0001      Knee/Hip Exercises: Aerobic   Nustep  L2 x 12  min   status update from patient and patient's spouse     Knee/Hip Exercises: Seated   Long Arc Quad  Both;2 sets;Limitations;15 reps    Long Arc Quad Weight  10 lbs.    Marching  2 sets;15 reps    Marching Weights  10 lbs.               PT Short Term Goals - 03/03/18 1703      PT SHORT TERM GOAL #1   Title  Pt will be independent with his initial HEP to improve LE strength and balance.    Time  4    Period  Weeks    Status  Achieved      PT SHORT TERM GOAL #2   Title  Pt will demo improved gastroc strength, evident by his ability to complete atleast 5 single leg heel raises on each LE.    Baseline  3 on Lt, 15 on Rt     Time  4    Period  Weeks    Status  Achieved        PT Long Term Goals - 03/07/18 1151      PT LONG TERM GOAL #1   Title  Pt will be independent with advanced HEP to decrease risk of deconditioning and future falls after discharge from PT.     Baseline  Pt's wife is unable to do the amount of exercises with her spouse needed to progress and patient would regress without skilled PT at this time    Time  6    Period  Weeks    Status  On-going    Target Date  04/18/18      PT LONG TERM GOAL #2   Title  Pt will complete 5x sit to stand in less than 14 sec without UE support, to reflect improvements in functional strength and power.     Time  6    Period  Weeks    Status  On-going    Target Date  04/18/18      PT LONG TERM GOAL #3   Title  Pt will be able to complete the TUG in less than 13 sec, with LRAD, to reflect improvements in his balance and stability with ambulation in the  community.     Baseline  3 trials: 17, 13, 12    Time  6    Period  Weeks    Status  Partially Met    Target Date  04/18/18      PT LONG TERM GOAL #4   Title  Pt will demonstrate DGI of >19/24 to demonstrate decreased risk of falls    Baseline  12/24 (03/07/18)    Time  6    Period  Weeks    Status  New    Target Date  04/18/18      PT LONG TERM GOAL #5    Title  Improved Berg score to > or = to 50/56 for reduced risk of falls    Baseline  47/56 from 49/56 (03/07/18)    Time  6    Period  Weeks    Status  Partially Met    Target Date  04/18/18            Plan - 03/07/18 1513    Clinical Impression Statement  Pt continues to make steady progress.  He demonstrates improvements toward functional goals. He was given DGI today due to still demonstrating unseadiness of gait.  His score indicates unsafe and increased risk of falls.  Pt's wife is unable to do the amount of exercises with her spouse needed to progress and patient would be excpected to regress without skilled PT at this time. He will benefit from skilled PT to porgress strength and dynamic gait and balance for improved function and reduced risk of falls.    PT Treatment/Interventions  ADLs/Self Care Home Management;Moist Heat;Cryotherapy;Therapeutic activities;Functional mobility training;Stair training;Gait training;Therapeutic exercise;Neuromuscular re-education;Balance training;Patient/family education;Manual techniques;Passive range of motion    PT Next Visit Plan  KX modifier needeed; Hip and quad strength progression, ankle strength and flexiblility, dynamic balance with dual task activities for improved coordination; progress HEP as able    PT Home Exercise Plan  Access Code: [email or web address]     Recommended Other Services  re-cert 29/9/37    Consulted and Agree with Plan of Care  Patient;Family member/caregiver    Family Member Consulted  Spouse       Patient will benefit from skilled therapeutic intervention in order to improve the following deficits and impairments:  Abnormal gait, Decreased activity tolerance, Decreased safety awareness, Decreased strength, Impaired flexibility, Postural dysfunction, Pain, Improper body mechanics, Decreased range of motion, Decreased endurance, Decreased balance, Decreased mobility, Difficulty walking, Hypomobility  Visit Diagnosis: Unsteadiness on  feet  History of falling  Muscle weakness (generalized)  Difficulty in walking, not elsewhere classified     Problem List Patient Active Problem List   Diagnosis Date Noted  . Dupuytren's contracture 10/30/2017  . Frequent falls 10/30/2017  . History of closed head injury 07/25/2017  . Osteoarthritis, multiple sites 07/08/2016  . Risk for falls 07/08/2016  . Premature atrial contractions 05/11/2015  . Sepsis ([place]) 04/29/2015  . History of small bowel obstruction 04/29/2015  . History of traumatic brain injury 04/29/2015  . Acute respiratory failure ([place]) 04/29/2015  . CAP (community acquired pneumonia)   . Unspecified cerebral artery occlusion with cerebral infarction 08/25/2012  . Aphasia 08/25/2012  . Macular degeneration 04/25/2012  . CVA (cerebral infarction) 03/20/2012  . Small bowel obstruction ([place]) 09/12/2011  . Nausea & vomiting 09/12/2011  . Memory loss 04/01/2010  . 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 SKIN LESION 10/24/2009  . NEOPLASM, SKIN, UNCERTAIN BEHAVIOR 16/96/7893  . DYSGEUSIA 10/10/2009  . Hyperlipidemia 07/20/2008  .  DEPRESSION 07/20/2008  . Essential hypertension 07/20/2008  . RHINITIS 07/20/2008  . COLONIC POLYPS, HX OF 07/20/2008  . GERD 07/18/2008  . PROSTATE CANCER, HX OF 07/18/2008    Zannie Cove, PT 03/07/2018, 4:07 PM  [facility] [address] [place], [place], [address] Phone: [phone]   Fax:  [phone]  Name: VOYD GROFT MRN: [id number] Date of Birth: 1931-05-01

## 2018-03-10 ENCOUNTER — Encounter: Payer: Self-pay | Admitting: Physical Therapy

## 2018-03-10 ENCOUNTER — Ambulatory Visit: Payer: Medicare Other | Admitting: Physical Therapy

## 2018-03-10 ENCOUNTER — Encounter

## 2018-03-10 DIAGNOSIS — R262 Difficulty in walking, not elsewhere classified: Secondary | ICD-10-CM

## 2018-03-10 DIAGNOSIS — R2681 Unsteadiness on feet: Secondary | ICD-10-CM | POA: Diagnosis not present

## 2018-03-10 DIAGNOSIS — Z9181 History of falling: Secondary | ICD-10-CM

## 2018-03-10 DIAGNOSIS — M6281 Muscle weakness (generalized): Secondary | ICD-10-CM | POA: Diagnosis not present

## 2018-03-10 NOTE — Therapy (Signed)
[facility] [address] [place], [place], [address] Phone: [phone]   Fax:  [phone]  Physical Therapy Treatment  Patient Details  Name: Eric George MRN: [id number] Date of Birth: 17-Jun-1930 Referring Provider (PT): Carolann Littler, MD    Encounter Date: 03/10/2018  PT End of Session - 03/10/18 1036    Visit Number  27    Date for PT Re-Evaluation  04/18/18    Authorization Type  Medicare A and B; [place] - Visit Number  2    Authorization - Number of Visits  10    PT Start Time  8185    PT Stop Time  1055    PT Time Calculation (min)  40 min    Activity Tolerance  No increased pain;Patient tolerated treatment well    Behavior During Therapy  [facility] for tasks assessed/performed       Past Medical History:  Diagnosis Date  . Bowel obstruction ([place])   . CAP (community acquired pneumonia)   . 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 GERD (gastroesophageal reflux disease)   . Hydrocephalus ([place])   . Hyperlipidemia   . Hypertension   . Osteoarthritis, multiple sites 07/08/2016  . 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 Stroke ([place])   . TBI (traumatic brain injury) [facility])     Past Surgical History:  Procedure Laterality Date  . BRAIN SURGERY    . HERNIA REPAIR    . PROSTATECTOMY    . TEE WITHOUT CARDIOVERSION  03/22/2012   Procedure: TRANSESOPHAGEAL ECHOCARDIOGRAM (TEE);  Surgeon: [name], MD;  Location: [facility] ENDOSCOPY;  Service: Cardiovascular;  Laterality: N/A;    There were no vitals filed for this visit.  Subjective Assessment - 03/10/18 1020    Subjective  Pt reports that things are going well. No concerns at this time.     Patient is accompained by:  Family member    Patient Stated Goals  decrease risk of falling     Currently in Pain?  No/denies                       [facility] Adult PT Treatment/Exercise - 03/10/18  0001      Knee/Hip Exercises: Aerobic   Nustep  60 sec intervals L2 to L5 x8 min      Knee/Hip Exercises: Standing   Hip Abduction  Stengthening;Both;2 sets;15 reps;Knee straight    Abduction Limitations  yellow TB around feet, pt encouraged to slow down adduction portion of exercise      Knee/Hip Exercises: Seated   Marching  2 sets;15 reps    Marching Weights  10 lbs.          Balance Exercises - 03/10/18 1038      Balance Exercises: Standing   Standing Eyes Opened  Narrow base of support (BOS);Head turns;2 reps;30 secs    Tandem Stance  Eyes open;Foam/compliant surface;1 rep;30 secs   looking up/down   Sidestepping  2 reps   x30 ft    Step Over Hurdles / Cones  forward walk with step over small hurdle CGA x5 trials     Toe Raise Limitations  x20 reps without UE support     Other Standing Exercises  NBOS with ball catch from bounce x10 reps forward, sideways         PT Education - 03/10/18 1055  Education Details  technique with therex     Person(s) Educated  Patient    Methods  Explanation;Verbal cues    Comprehension  Verbalized understanding;Returned demonstration       PT Short Term Goals - 03/03/18 1703      PT SHORT TERM GOAL #1   Title  Pt will be independent with his initial HEP to improve LE strength and balance.    Time  4    Period  Weeks    Status  Achieved      PT SHORT TERM GOAL #2   Title  Pt will demo improved gastroc strength, evident by his ability to complete atleast 5 single leg heel raises on each LE.    Baseline  3 on Lt, 15 on Rt     Time  4    Period  Weeks    Status  Achieved        PT Long Term Goals - 03/07/18 1151      PT LONG TERM GOAL #1   Title  Pt will be independent with advanced HEP to decrease risk of deconditioning and future falls after discharge from PT.     Baseline  Pt's wife is unable to do the amount of exercises with her spouse needed to progress and patient would regress without skilled PT at this time     Time  6    Period  Weeks    Status  On-going    Target Date  04/18/18      PT LONG TERM GOAL #2   Title  Pt will complete 5x sit to stand in less than 14 sec without UE support, to reflect improvements in functional strength and power.     Time  6    Period  Weeks    Status  On-going    Target Date  04/18/18      PT LONG TERM GOAL #3   Title  Pt will be able to complete the TUG in less than 13 sec, with LRAD, to reflect improvements in his balance and stability with ambulation in the community.     Baseline  3 trials: 17, 13, 12    Time  6    Period  Weeks    Status  Partially Met    Target Date  04/18/18      PT LONG TERM GOAL #4   Title  Pt will demonstrate DGI of >19/24 to demonstrate decreased risk of falls    Baseline  12/24 (03/07/18)    Time  6    Period  Weeks    Status  New    Target Date  04/18/18      PT LONG TERM GOAL #5   Title  Improved Berg score to > or = to 50/56 for reduced risk of falls    Baseline  47/56 from 49/56 (03/07/18)    Time  6    Period  Weeks    Status  Partially Met    Target Date  04/18/18            Plan - 03/10/18 1056    Clinical Impression Statement  Today's session focused on therex and neuromuscular re-education to improve pt's LE strength, endurance and proprioception. Several adjustments for made to balance activity to incorporate dual task of head turns, ball catch, etc. Pt was able to complete these with minimal difficulty noted, however close supervision was provided for safety. Pt did demonstrate difficulty with stepping over  small hurdles, requiring verbal instruction for awareness. Will continue with current POC.     PT Treatment/Interventions  ADLs/Self Care Home Management;Moist Heat;Cryotherapy;Therapeutic activities;Functional mobility training;Stair training;Gait training;Therapeutic exercise;Neuromuscular re-education;Balance training;Patient/family education;Manual techniques;Passive range of motion    PT Next Visit  Plan  KX modifier needeed; Hip and quad strength progression, ankle strength and flexiblility, dynamic balance with dual task activities for improved coordination; progress HEP as able    PT Home Exercise Plan  Access Code: [email or web address]     Consulted and Agree with Plan of Care  Patient;Family member/caregiver    Family Member Consulted  Spouse       Patient will benefit from skilled therapeutic intervention in order to improve the following deficits and impairments:  Abnormal gait, Decreased activity tolerance, Decreased safety awareness, Decreased strength, Impaired flexibility, Postural dysfunction, Pain, Improper body mechanics, Decreased range of motion, Decreased endurance, Decreased balance, Decreased mobility, Difficulty walking, Hypomobility  Visit Diagnosis: Unsteadiness on feet  History of falling  Muscle weakness (generalized)  Difficulty in walking, not elsewhere classified     Problem List Patient Active Problem List   Diagnosis Date Noted  . Dupuytren's contracture 10/30/2017  . Frequent falls 10/30/2017  . History of closed head injury 07/25/2017  . Osteoarthritis, multiple sites 07/08/2016  . Risk for falls 07/08/2016  . Premature atrial contractions 05/11/2015  . Sepsis ([place]) 04/29/2015  . History of small bowel obstruction 04/29/2015  . History of traumatic brain injury 04/29/2015  . Acute respiratory failure ([place]) 04/29/2015  . CAP (community acquired pneumonia)   . Unspecified cerebral artery occlusion with cerebral infarction 08/25/2012  . Aphasia 08/25/2012  . Macular degeneration 04/25/2012  . CVA (cerebral infarction) 03/20/2012  . Small bowel obstruction ([place]) 09/12/2011  . Nausea & vomiting 09/12/2011  . Memory loss 04/01/2010  . 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 SKIN LESION 10/24/2009  . NEOPLASM, SKIN, UNCERTAIN BEHAVIOR 37/34/2876  . DYSGEUSIA 10/10/2009  . Hyperlipidemia 07/20/2008  . DEPRESSION 07/20/2008  . Essential hypertension  07/20/2008  . RHINITIS 07/20/2008  . COLONIC POLYPS, HX OF 07/20/2008  . GERD 07/18/2008  . PROSTATE CANCER, HX OF 07/18/2008   11:07 AM,03/10/18 Sherol Dade PT, DPT [place] [facility] [place]  [facility] [address] [place], [place], [address] Phone: [phone]   Fax:  [phone]  Name: RYHEEM JAY MRN: [id number] Date of Birth: 03/28/1931

## 2018-03-15 ENCOUNTER — Encounter: Payer: Self-pay | Admitting: Physical Therapy

## 2018-03-15 ENCOUNTER — Encounter: Payer: Self-pay | Admitting: Family Medicine

## 2018-03-15 ENCOUNTER — Ambulatory Visit: Payer: Medicare Other | Admitting: Physical Therapy

## 2018-03-15 ENCOUNTER — Ambulatory Visit (INDEPENDENT_AMBULATORY_CARE_PROVIDER_SITE_OTHER): Payer: Medicare Other | Admitting: Family Medicine

## 2018-03-15 ENCOUNTER — Ambulatory Visit (INDEPENDENT_AMBULATORY_CARE_PROVIDER_SITE_OTHER): Payer: Medicare Other

## 2018-03-15 VITALS — BP 140/76 | HR 50 | Temp 97.9°F | Wt 185.3 lb

## 2018-03-15 DIAGNOSIS — S60221A Contusion of right hand, initial encounter: Secondary | ICD-10-CM

## 2018-03-15 DIAGNOSIS — R2681 Unsteadiness on feet: Secondary | ICD-10-CM

## 2018-03-15 DIAGNOSIS — M6281 Muscle weakness (generalized): Secondary | ICD-10-CM

## 2018-03-15 DIAGNOSIS — R262 Difficulty in walking, not elsewhere classified: Secondary | ICD-10-CM | POA: Diagnosis not present

## 2018-03-15 DIAGNOSIS — S6991XA Unspecified injury of right wrist, hand and finger(s), initial encounter: Secondary | ICD-10-CM | POA: Diagnosis not present

## 2018-03-15 DIAGNOSIS — Z9181 History of falling: Secondary | ICD-10-CM | POA: Diagnosis not present

## 2018-03-15 NOTE — Therapy (Signed)
[facility] [address], [place], [place], [address] Phone: [phone]   Fax:  [phone]  Physical Therapy Treatment  Patient Details  Name: Eric George MRN: [id number] Date of Birth: 03-26-1931 Referring Provider (PT): Carolann Littler, MD    Encounter Date: 03/15/2018  PT End of Session - 03/15/18 1705    Visit Number  28    Date for PT Re-Evaluation  04/18/18    Authorization Type  Medicare A and B; [employer] - Visit Number  3    Authorization - Number of Visits  10    PT Start Time  2482    PT Stop Time  5003    PT Time Calculation (min)  42 min    Activity Tolerance  No increased pain;Patient tolerated treatment well    Behavior During Therapy  [facility] for tasks assessed/performed       Past Medical History:  Diagnosis Date  . Bowel obstruction ([place])   . CAP (community acquired pneumonia)   . 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 GERD (gastroesophageal reflux disease)   . Hydrocephalus ([place])   . Hyperlipidemia   . Hypertension   . Osteoarthritis, multiple sites 07/08/2016  . 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 Stroke ([place])   . TBI (traumatic brain injury) [facility])     Past Surgical History:  Procedure Laterality Date  . BRAIN SURGERY    . HERNIA REPAIR    . PROSTATECTOMY    . TEE WITHOUT CARDIOVERSION  03/22/2012   Procedure: TRANSESOPHAGEAL ECHOCARDIOGRAM (TEE);  Surgeon: [name], MD;  Location: [facility] ENDOSCOPY;  Service: Cardiovascular;  Laterality: N/A;    There were no vitals filed for this visit.  Subjective Assessment - 03/15/18 1535    Subjective  Pt's wife reports that he fell yesterday. She thinks he missed the chair when he went to sit down. They saw his PCP who took xrays and will call them with results.     Patient is accompained by:  Family member    Patient Stated Goals  decrease risk of falling     Currently in  Pain?  No/denies                       [facility] Adult PT Treatment/Exercise - 03/15/18 0001      Knee/Hip Exercises: Aerobic   Nustep  60 sec intervals L2 to L5 x6 min      Knee/Hip Exercises: Standing   Heel Raises  Both;1 set;20 reps    Heel Raises Limitations  half foam roll     Hip Abduction  Stengthening;Both;2 sets;15 reps;Knee straight    Abduction Limitations  red TB around ankles, no UE support and close supervision      Knee/Hip Exercises: Seated   Long Arc Quad  Both;2 sets;Limitations;15 reps    Long Arc Quad Weight  10 lbs.    Marching  2 sets;15 reps    Marching Weights  10 lbs.          Balance Exercises - 03/15/18 1556      Balance Exercises: Standing   Standing Eyes Closed  Narrow base of support (BOS);Foam/compliant surface   lateral flexion and rotation Lt/Rt x10 reps each    Tandem Stance  Eyes open;Foam/compliant surface;2 reps;30 secs;Other (comment)   looking up/down x10 reps, encouraged to increase speed  Step Over Hurdles / Cones  single leg step and floor tap over 10" hurdle intermittent UE support x10 reps each     Other Standing Exercises  lateral step over box x5 reps without UE support         PT Education - 03/15/18 1704    Education Details  technique with therex    Methods  Explanation;Verbal cues;Demonstration;Tactile cues    Comprehension  Verbalized understanding;Need further instruction       PT Short Term Goals - 03/03/18 1703      PT SHORT TERM GOAL #1   Title  Pt will be independent with his initial HEP to improve LE strength and balance.    Time  4    Period  Weeks    Status  Achieved      PT SHORT TERM GOAL #2   Title  Pt will demo improved gastroc strength, evident by his ability to complete atleast 5 single leg heel raises on each LE.    Baseline  3 on Lt, 15 on Rt     Time  4    Period  Weeks    Status  Achieved        PT Long Term Goals - 03/07/18 1151      PT LONG TERM GOAL #1   Title  Pt  will be independent with advanced HEP to decrease risk of deconditioning and future falls after discharge from PT.     Baseline  Pt's wife is unable to do the amount of exercises with her spouse needed to progress and patient would regress without skilled PT at this time    Time  6    Period  Weeks    Status  On-going    Target Date  04/18/18      PT LONG TERM GOAL #2   Title  Pt will complete 5x sit to stand in less than 14 sec without UE support, to reflect improvements in functional strength and power.     Time  6    Period  Weeks    Status  On-going    Target Date  04/18/18      PT LONG TERM GOAL #3   Title  Pt will be able to complete the TUG in less than 13 sec, with LRAD, to reflect improvements in his balance and stability with ambulation in the community.     Baseline  3 trials: 17, 13, 12    Time  6    Period  Weeks    Status  Partially Met    Target Date  04/18/18      PT LONG TERM GOAL #4   Title  Pt will demonstrate DGI of >19/24 to demonstrate decreased risk of falls    Baseline  12/24 (03/07/18)    Time  6    Period  Weeks    Status  New    Target Date  04/18/18      PT LONG TERM GOAL #5   Title  Improved Berg score to > or = to 50/56 for reduced risk of falls    Baseline  47/56 from 49/56 (03/07/18)    Time  6    Period  Weeks    Status  Partially Met    Target Date  04/18/18            Plan - 03/15/18 1705    Clinical Impression Statement  Pt arrived with his wife and reports of missing  his chair and falling yesterday. He had redness and swelling of the Rt distal 5th metacarpal. He was encouraged to continue with PT by his PCP and all UE supported activities were adjusted this session. Pt was able to complete therex for LE resistance, however he did have difficulty with performing forward and lateral step over hurdles. Rt glute med weakness makes it more difficult for pt to lead with his LLE particularly with significant Trendelenburg deviation. Session  ended with general fatigue but no increased soreness. Will continue with current POC.     PT Treatment/Interventions  ADLs/Self Care Home Management;Moist Heat;Cryotherapy;Therapeutic activities;Functional mobility training;Stair training;Gait training;Therapeutic exercise;Neuromuscular re-education;Balance training;Patient/family education;Manual techniques;Passive range of motion    PT Next Visit Plan  KX modifier needeed; Hip and quad strength progression, ankle strength and flexiblility, dynamic balance with dual task activities for improved coordination; progress HEP as able    PT Home Exercise Plan  Access Code: [email or web address]     Consulted and Agree with Plan of Care  Patient;Family member/caregiver    Family Member Consulted  Spouse       Patient will benefit from skilled therapeutic intervention in order to improve the following deficits and impairments:  Abnormal gait, Decreased activity tolerance, Decreased safety awareness, Decreased strength, Impaired flexibility, Postural dysfunction, Pain, Improper body mechanics, Decreased range of motion, Decreased endurance, Decreased balance, Decreased mobility, Difficulty walking, Hypomobility  Visit Diagnosis: Unsteadiness on feet  History of falling  Muscle weakness (generalized)  Difficulty in walking, not elsewhere classified     Problem List Patient Active Problem List   Diagnosis Date Noted  . Dupuytren's contracture 10/30/2017  . Frequent falls 10/30/2017  . History of closed head injury 07/25/2017  . Osteoarthritis, multiple sites 07/08/2016  . Risk for falls 07/08/2016  . Premature atrial contractions 05/11/2015  . Sepsis ([place]) 04/29/2015  . History of small bowel obstruction 04/29/2015  . History of traumatic brain injury 04/29/2015  . Acute respiratory failure ([place]) 04/29/2015  . CAP (community acquired pneumonia)   . Unspecified cerebral artery occlusion with cerebral infarction 08/25/2012  . Aphasia 08/25/2012  .  Macular degeneration 04/25/2012  . CVA (cerebral infarction) 03/20/2012  . Small bowel obstruction ([place]) 09/12/2011  . Nausea & vomiting 09/12/2011  . Memory loss 04/01/2010  . 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 SKIN LESION 10/24/2009  . NEOPLASM, SKIN, UNCERTAIN BEHAVIOR 91/79/1505  . DYSGEUSIA 10/10/2009  . Hyperlipidemia 07/20/2008  . DEPRESSION 07/20/2008  . Essential hypertension 07/20/2008  . RHINITIS 07/20/2008  . COLONIC POLYPS, HX OF 07/20/2008  . GERD 07/18/2008  . PROSTATE CANCER, HX OF 07/18/2008     5:10 PM,03/15/18 Sherol Dade PT, DPT [place] [facility] [address] [place], [place], [address] Phone: [phone]   Fax:  [phone]  Name: GLENARD KEESLING MRN: [id number] Date of Birth: 1931-01-02

## 2018-03-15 NOTE — Progress Notes (Signed)
   Subjective:    Patient ID: Eric George, male    DOB: 19-Feb-1931, 84 y.o.   MRN: [id number]  HPI Here to check his right hand. He fell at home last night when he slid off a bar stool onto the floor. He fel fine last night, but this morning the 5th knuckle is swollen, red, and slightly painful. They have been applying ice and he took some Tylenol.    Review of Systems  Constitutional: Negative.   Respiratory: Negative.   Cardiovascular: Negative.   Musculoskeletal: Positive for arthralgias.       Objective:   Physical Exam  Constitutional: He appears well-developed and well-nourished. No distress.  Cardiovascular: Normal rate, regular rhythm, normal heart sounds and intact distal pulses.  Pulmonary/Chest: Effort normal and breath sounds normal.  Musculoskeletal:  The right 5th MCP joint has a longstanding flexion contracture, but the tendon is intact. The dorsal joint is pink, sightly swollen and slightly tender. No crepitus           Assessment & Plan:  He has a contusion of the hand. I think a fracture is unlikely, but we will get an Xray to be complete. He can use ice packs and Tylenol prn.  Alysia Penna, MD

## 2018-03-21 ENCOUNTER — Encounter: Payer: Self-pay | Admitting: *Deleted

## 2018-03-22 ENCOUNTER — Encounter: Payer: Self-pay | Admitting: Physical Therapy

## 2018-03-22 ENCOUNTER — Ambulatory Visit: Payer: Medicare Other | Admitting: Physical Therapy

## 2018-03-22 DIAGNOSIS — R262 Difficulty in walking, not elsewhere classified: Secondary | ICD-10-CM

## 2018-03-22 DIAGNOSIS — L821 Other seborrheic keratosis: Secondary | ICD-10-CM | POA: Diagnosis not present

## 2018-03-22 DIAGNOSIS — R2681 Unsteadiness on feet: Secondary | ICD-10-CM | POA: Diagnosis not present

## 2018-03-22 DIAGNOSIS — Z9181 History of falling: Secondary | ICD-10-CM

## 2018-03-22 DIAGNOSIS — D485 Neoplasm of uncertain behavior of skin: Secondary | ICD-10-CM | POA: Diagnosis not present

## 2018-03-22 DIAGNOSIS — C44319 Basal cell carcinoma of skin of other parts of face: Secondary | ICD-10-CM | POA: Diagnosis not present

## 2018-03-22 DIAGNOSIS — Z85828 Personal history of other malignant neoplasm of skin: Secondary | ICD-10-CM | POA: Diagnosis not present

## 2018-03-22 DIAGNOSIS — M6281 Muscle weakness (generalized): Secondary | ICD-10-CM | POA: Diagnosis not present

## 2018-03-22 DIAGNOSIS — L570 Actinic keratosis: Secondary | ICD-10-CM | POA: Diagnosis not present

## 2018-03-22 NOTE — Therapy (Signed)
[facility] [address] [place], [place], [address] Phone: [phone]   Fax:  [phone]  Physical Therapy Treatment  Patient Details  Name: Eric George MRN: [id number] Date of Birth: 12-Jan-1931 Referring Provider (PT): Carolann Littler, MD    Encounter Date: 03/22/2018  PT End of Session - 03/22/18 1618    Visit Number  29    Date for PT Re-Evaluation  04/18/18    Authorization Type  Medicare A and B; [employer]    Authorization Time Period  --    Authorization - Visit Number  4    Authorization - Number of Visits  10    PT Start Time  8250    PT Stop Time  0370    PT Time Calculation (min)  41 min    Activity Tolerance  No increased pain;Patient tolerated treatment well    Behavior During Therapy  [facility] for tasks assessed/performed       Past Medical History:  Diagnosis Date  . Bowel obstruction ([place])   . CAP (community acquired pneumonia)   . 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 GERD (gastroesophageal reflux disease)   . Hydrocephalus ([place])   . Hyperlipidemia   . Hypertension   . Osteoarthritis, multiple sites 07/08/2016  . 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 Stroke ([place])   . TBI (traumatic brain injury) [facility])     Past Surgical History:  Procedure Laterality Date  . BRAIN SURGERY    . HERNIA REPAIR    . PROSTATECTOMY    . TEE WITHOUT CARDIOVERSION  03/22/2012   Procedure: TRANSESOPHAGEAL ECHOCARDIOGRAM (TEE);  Surgeon: [name], MD;  Location: [facility] ENDOSCOPY;  Service: Cardiovascular;  Laterality: N/A;    There were no vitals filed for this visit.  Subjective Assessment - 03/22/18 1700    Subjective  I don't think anything is new that I am aware of.    Patient Stated Goals  decrease risk of falling     Currently in Pain?  No/denies                       [facility] Adult PT Treatment/Exercise - 03/22/18 0001      Ambulation/Gait    Gait Comments  Head turns up, right , and left on command, weaving in and out of cones, tapping cones during gait, picking up cones      Knee/Hip Exercises: Aerobic   Nustep  intervals - L1-5 x 8 min      Knee/Hip Exercises: Standing   Heel Raises  Both;1 set;20 reps    Heel Raises Limitations  foam mat with one UE support and close sup/CGA    Hip Abduction  Stengthening;Right;Left;10 reps;Knee straight    Abduction Limitations  red TB around ankles, no UE support and close supervision    Forward Step Up  Right;Left;10 reps;Hand Hold: 0;Hand Hold: 1;Limitations    Forward Step Up Limitations  4" stepping up and over during gait - occasional UE support    Other Standing Knee Exercises  single leg standon green pad 2 x 10 sec with UE support x1      Knee/Hip Exercises: Seated   Long Arc Quad  Both;2 sets;Limitations;15 reps    Long Arc Quad Weight  10 lbs.    Marching  2 sets;15 reps    Marching Weights  10 lbs.  Sit to Sand  10 reps   with green band horizontal abduction              PT Short Term Goals - 03/03/18 1703      PT SHORT TERM GOAL #1   Title  Pt will be independent with his initial HEP to improve LE strength and balance.    Time  4    Period  Weeks    Status  Achieved      PT SHORT TERM GOAL #2   Title  Pt will demo improved gastroc strength, evident by his ability to complete atleast 5 single leg heel raises on each LE.    Baseline  3 on Lt, 15 on Rt     Time  4    Period  Weeks    Status  Achieved        PT Long Term Goals - 03/07/18 1151      PT LONG TERM GOAL #1   Title  Pt will be independent with advanced HEP to decrease risk of deconditioning and future falls after discharge from PT.     Baseline  Pt's wife is unable to do the amount of exercises with her spouse needed to progress and patient would regress without skilled PT at this time    Time  6    Period  Weeks    Status  On-going    Target Date  04/18/18      PT LONG TERM GOAL #2    Title  Pt will complete 5x sit to stand in less than 14 sec without UE support, to reflect improvements in functional strength and power.     Time  6    Period  Weeks    Status  On-going    Target Date  04/18/18      PT LONG TERM GOAL #3   Title  Pt will be able to complete the TUG in less than 13 sec, with LRAD, to reflect improvements in his balance and stability with ambulation in the community.     Baseline  3 trials: 17, 13, 12    Time  6    Period  Weeks    Status  Partially Met    Target Date  04/18/18      PT LONG TERM GOAL #4   Title  Pt will demonstrate DGI of >19/24 to demonstrate decreased risk of falls    Baseline  12/24 (03/07/18)    Time  6    Period  Weeks    Status  New    Target Date  04/18/18      PT LONG TERM GOAL #5   Title  Improved Berg score to > or = to 50/56 for reduced risk of falls    Baseline  47/56 from 49/56 (03/07/18)    Time  6    Period  Weeks    Status  Partially Met    Target Date  04/18/18            Plan - 03/22/18 1706    Clinical Impression Statement  Pt did well with dynamic balance activities without UE support.  He was able to catch LOB usually caused by scissoring of LE during gait.  Pt needs close supervision and some CGA with all standing balance activities.  Pt is noticeably fatigued at the end of the session and has increased trendelenburg at the end of treatment.  He continues to demonstrate progress overall and  benefits from skilled PT to reduce the risk of falls.    PT Treatment/Interventions  ADLs/Self Care Home Management;Moist Heat;Cryotherapy;Therapeutic activities;Functional mobility training;Stair training;Gait training;Therapeutic exercise;Neuromuscular re-education;Balance training;Patient/family education;Manual techniques;Passive range of motion    PT Next Visit Plan  KX modifier needeed; Hip and quad strength progression, ankle strength and flexiblility, dynamic balance with dual task activities for improved  coordination; progress HEP as able    PT Home Exercise Plan  Access Code: [email or web address]     Consulted and Agree with Plan of Care  Patient       Patient will benefit from skilled therapeutic intervention in order to improve the following deficits and impairments:  Abnormal gait, Decreased activity tolerance, Decreased safety awareness, Decreased strength, Impaired flexibility, Postural dysfunction, Pain, Improper body mechanics, Decreased range of motion, Decreased endurance, Decreased balance, Decreased mobility, Difficulty walking, Hypomobility  Visit Diagnosis: Unsteadiness on feet  History of falling  Muscle weakness (generalized)  Difficulty in walking, not elsewhere classified     Problem List Patient Active Problem List   Diagnosis Date Noted  . Dupuytren's contracture 10/30/2017  . Frequent falls 10/30/2017  . History of closed head injury 07/25/2017  . Osteoarthritis, multiple sites 07/08/2016  . Risk for falls 07/08/2016  . Premature atrial contractions 05/11/2015  . Sepsis ([place]) 04/29/2015  . History of small bowel obstruction 04/29/2015  . History of traumatic brain injury 04/29/2015  . Acute respiratory failure ([place]) 04/29/2015  . CAP (community acquired pneumonia)   . Unspecified cerebral artery occlusion with cerebral infarction 08/25/2012  . Aphasia 08/25/2012  . Macular degeneration 04/25/2012  . CVA (cerebral infarction) 03/20/2012  . Small bowel obstruction ([place]) 09/12/2011  . Nausea & vomiting 09/12/2011  . Memory loss 04/01/2010  . 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 SKIN LESION 10/24/2009  . NEOPLASM, SKIN, UNCERTAIN BEHAVIOR 94/70/9628  . DYSGEUSIA 10/10/2009  . Hyperlipidemia 07/20/2008  . DEPRESSION 07/20/2008  . Essential hypertension 07/20/2008  . RHINITIS 07/20/2008  . COLONIC POLYPS, HX OF 07/20/2008  . GERD 07/18/2008  . PROSTATE CANCER, HX OF 07/18/2008    Zannie Cove, PT 03/22/2018, 5:09 PM  [facility] [address], [place], [place], [address] Phone: [phone]   Fax:  [phone]  Name: MALAHKI GASAWAY MRN: [id number] Date of Birth: 07-14-1930

## 2018-03-24 ENCOUNTER — Ambulatory Visit: Payer: Medicare Other | Admitting: Physical Therapy

## 2018-03-24 DIAGNOSIS — R2681 Unsteadiness on feet: Secondary | ICD-10-CM

## 2018-03-24 DIAGNOSIS — R262 Difficulty in walking, not elsewhere classified: Secondary | ICD-10-CM | POA: Diagnosis not present

## 2018-03-24 DIAGNOSIS — M6281 Muscle weakness (generalized): Secondary | ICD-10-CM

## 2018-03-24 DIAGNOSIS — Z9181 History of falling: Secondary | ICD-10-CM | POA: Diagnosis not present

## 2018-03-24 NOTE — Therapy (Signed)
[facility] [address], [place], [place], [address] Phone: [phone]   Fax:  [phone]  Physical Therapy Treatment  Patient Details  Name: Eric George MRN: [id number] Date of Birth: 20-Dec-1930 Referring Provider (PT): Carolann Littler, MD    Encounter Date: 03/24/2018  PT End of Session - 03/24/18 1306    Visit Number  30    Date for PT Re-Evaluation  04/18/18    Authorization Type  Medicare A and B; [place] - Visit Number  5    Authorization - Number of Visits  10    PT Start Time  9767    PT Stop Time  1309  (Pended)     PT Time Calculation (min)  38 min  (Pended)     Activity Tolerance  No increased pain;Patient tolerated treatment well    Behavior During Therapy  [facility] for tasks assessed/performed       Past Medical History:  Diagnosis Date  . Bowel obstruction ([place])   . CAP (community acquired pneumonia)   . 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 GERD (gastroesophageal reflux disease)   . Hydrocephalus ([place])   . Hyperlipidemia   . Hypertension   . Osteoarthritis, multiple sites 07/08/2016  . 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 Stroke ([place])   . TBI (traumatic brain injury) [facility])     Past Surgical History:  Procedure Laterality Date  . BRAIN SURGERY    . HERNIA REPAIR    . PROSTATECTOMY    . TEE WITHOUT CARDIOVERSION  03/22/2012   Procedure: TRANSESOPHAGEAL ECHOCARDIOGRAM (TEE);  Surgeon: [name], MD;  Location: [facility] ENDOSCOPY;  Service: Cardiovascular;  Laterality: N/A;    There were no vitals filed for this visit.  Subjective Assessment - 03/24/18 1233    Subjective  Pt reports that his hand is still sore some. No other complaints.     Patient Stated Goals  decrease risk of falling     Currently in Pain?  No/denies                       [facility] Adult PT Treatment/Exercise - 03/24/18 0001      Ambulation/Gait   Gait Comments  ambulating trials of 83f x6 reps weaving in/out of cones 2.577fapart (pt unable to complete 26f64fpart); Pt ambulating 75f14fd picking up cones from the floor x4 trials; Pt ambulating and alternating LE tap on cones intermittently x2 trials; Pt ambulating forward x20 ft while stacking cones x4 trials.      Knee/Hip Exercises: Standing   Heel Raises  Both;1 set;20 reps    Heel Raises Limitations  half foam roll     Other Standing Knee Exercises  knee flexion with 2# ankle weights x20 reps each       Knee/Hip Exercises: Supine   Straight Leg Raises  2 sets;10 reps;Strengthening    Straight Leg Raises Limitations  2#    Other Supine Knee/Hip Exercises  hip abduction with 2# 2x10 reps each with heavy cues to prevent trunk rotation      Knee/Hip Exercises: Prone   Hip Extension  Right;Left;2 sets;10 reps    Hip Extension Limitations  2#             PT Education - 03/24/18 1357    Education Details  technique with therex  Person(s) Educated  Patient    Methods  Explanation    Comprehension  Returned demonstration;Verbalized understanding       PT Short Term Goals - 03/03/18 1703      PT SHORT TERM GOAL #1   Title  Pt will be independent with his initial HEP to improve LE strength and balance.    Time  4    Period  Weeks    Status  Achieved      PT SHORT TERM GOAL #2   Title  Pt will demo improved gastroc strength, evident by his ability to complete atleast 5 single leg heel raises on each LE.    Baseline  3 on Lt, 15 on Rt     Time  4    Period  Weeks    Status  Achieved        PT Long Term Goals - 03/07/18 1151      PT LONG TERM GOAL #1   Title  Pt will be independent with advanced HEP to decrease risk of deconditioning and future falls after discharge from PT.     Baseline  Pt's wife is unable to do the amount of exercises with her spouse needed to progress and patient would regress without skilled PT at this time    Time  6     Period  Weeks    Status  On-going    Target Date  04/18/18      PT LONG TERM GOAL #2   Title  Pt will complete 5x sit to stand in less than 14 sec without UE support, to reflect improvements in functional strength and power.     Time  6    Period  Weeks    Status  On-going    Target Date  04/18/18      PT LONG TERM GOAL #3   Title  Pt will be able to complete the TUG in less than 13 sec, with LRAD, to reflect improvements in his balance and stability with ambulation in the community.     Baseline  3 trials: 17, 13, 12    Time  6    Period  Weeks    Status  Partially Met    Target Date  04/18/18      PT LONG TERM GOAL #4   Title  Pt will demonstrate DGI of >19/24 to demonstrate decreased risk of falls    Baseline  12/24 (03/07/18)    Time  6    Period  Weeks    Status  New    Target Date  04/18/18      PT LONG TERM GOAL #5   Title  Improved Berg score to > or = to 50/56 for reduced risk of falls    Baseline  47/56 from 49/56 (03/07/18)    Time  6    Period  Weeks    Status  Partially Met    Target Date  04/18/18            Plan - 03/24/18 1345    Clinical Impression Statement  Today's session continued with therex to improve LE strength and stability with gait. He was able to complete gait activity while picking up objects as well as LE taps onto a cone, however he did demonstrate unsteadiness with weaving in and out of cones. Pt's LE awareness during this activity was the primary cause of intermittent LOB. He was able to ambulate at the end of today's session  with less noted muscle fatigue than he has had in previous sessions. Will continue with current POC.    PT Treatment/Interventions  ADLs/Self Care Home Management;Moist Heat;Cryotherapy;Therapeutic activities;Functional mobility training;Stair training;Gait training;Therapeutic exercise;Neuromuscular re-education;Balance training;Patient/family education;Manual techniques;Passive range of motion    PT Next Visit Plan   KX modifier needeed; Hip and quad strength progression, ankle strength and flexiblility, dynamic balance with dual task activities for improved coordination; progress HEP as able    PT Home Exercise Plan  Access Code: [email or web address]     Consulted and Agree with Plan of Care  Patient       Patient will benefit from skilled therapeutic intervention in order to improve the following deficits and impairments:  Abnormal gait, Decreased activity tolerance, Decreased safety awareness, Decreased strength, Impaired flexibility, Postural dysfunction, Pain, Improper body mechanics, Decreased range of motion, Decreased endurance, Decreased balance, Decreased mobility, Difficulty walking, Hypomobility  Visit Diagnosis: Unsteadiness on feet  History of falling  Muscle weakness (generalized)  Difficulty in walking, not elsewhere classified     Problem List Patient Active Problem List   Diagnosis Date Noted  . Dupuytren's contracture 10/30/2017  . Frequent falls 10/30/2017  . History of closed head injury 07/25/2017  . Osteoarthritis, multiple sites 07/08/2016  . Risk for falls 07/08/2016  . Premature atrial contractions 05/11/2015  . Sepsis ([place]) 04/29/2015  . History of small bowel obstruction 04/29/2015  . History of traumatic brain injury 04/29/2015  . Acute respiratory failure ([place]) 04/29/2015  . CAP (community acquired pneumonia)   . Unspecified cerebral artery occlusion with cerebral infarction 08/25/2012  . Aphasia 08/25/2012  . Macular degeneration 04/25/2012  . CVA (cerebral infarction) 03/20/2012  . Small bowel obstruction ([place]) 09/12/2011  . Nausea & vomiting 09/12/2011  . Memory loss 04/01/2010  . 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 SKIN LESION 10/24/2009  . NEOPLASM, SKIN, UNCERTAIN BEHAVIOR 83/81/8403  . DYSGEUSIA 10/10/2009  . Hyperlipidemia 07/20/2008  . DEPRESSION 07/20/2008  . Essential hypertension 07/20/2008  . RHINITIS 07/20/2008  . COLONIC POLYPS, HX OF  07/20/2008  . GERD 07/18/2008  . PROSTATE CANCER, HX OF 07/18/2008    1:58 PM,03/24/18 Sherol Dade PT, DPT [facility] [address], [place], [place], [address] Phone: [phone]   Fax:  [phone]  Name: Eric George MRN: [id number] Date of Birth: May 12, 1930

## 2018-03-28 ENCOUNTER — Encounter: Payer: Self-pay | Admitting: Physical Therapy

## 2018-03-28 ENCOUNTER — Ambulatory Visit: Payer: Medicare Other | Admitting: Physical Therapy

## 2018-03-28 DIAGNOSIS — Z9181 History of falling: Secondary | ICD-10-CM | POA: Diagnosis not present

## 2018-03-28 DIAGNOSIS — R2681 Unsteadiness on feet: Secondary | ICD-10-CM | POA: Diagnosis not present

## 2018-03-28 DIAGNOSIS — M6281 Muscle weakness (generalized): Secondary | ICD-10-CM

## 2018-03-28 DIAGNOSIS — R262 Difficulty in walking, not elsewhere classified: Secondary | ICD-10-CM | POA: Diagnosis not present

## 2018-03-28 NOTE — Therapy (Signed)
[facility] [address] [place], [place], [address] Phone: [phone]   Fax:  [phone]  Physical Therapy Treatment  Patient Details  Name: Eric George MRN: [id number] Date of Birth: 09/19/1930 Referring Provider (PT): Carolann Littler, MD    Encounter Date: 03/28/2018  PT End of Session - 03/28/18 1201    Visit Number  31    Date for PT Re-Evaluation  04/18/18    Authorization Type  Medicare A and B; [place] - Visit Number  6    Authorization - Number of Visits  10    PT Start Time  1027    PT Stop Time  1225    PT Time Calculation (min)  42 min    Equipment Utilized During Treatment  Gait belt    Activity Tolerance  No increased pain;Patient tolerated treatment well    Behavior During Therapy  [facility] for tasks assessed/performed       Past Medical History:  Diagnosis Date  . Bowel obstruction ([place])   . CAP (community acquired pneumonia)   . 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 GERD (gastroesophageal reflux disease)   . Hydrocephalus ([place])   . Hyperlipidemia   . Hypertension   . Osteoarthritis, multiple sites 07/08/2016  . 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 Stroke ([place])   . TBI (traumatic brain injury) [facility]     Past Surgical History:  Procedure Laterality Date  . BRAIN SURGERY    . HERNIA REPAIR    . PROSTATECTOMY    . TEE WITHOUT CARDIOVERSION  03/22/2012   Procedure: TRANSESOPHAGEAL ECHOCARDIOGRAM (TEE);  Surgeon: [name], MD;  Location: [facility] ENDOSCOPY;  Service: Cardiovascular;  Laterality: N/A;    There were no vitals filed for this visit.  Subjective Assessment - 03/28/18 1155    Subjective  Pt reports that things are going well. No complaints at this time.     Patient Stated Goals  decrease risk of falling     Currently in Pain?  No/denies                       [facility] Adult PT Treatment/Exercise -  03/28/18 0001      Exercises   Exercises  Knee/Hip      Knee/Hip Exercises: Standing   SLS  solid surface 2x30 sec each, Max 9 sec on Rt and 7 sec on Lt    Other Standing Knee Exercises  hip extension step 2x10 reps each with red TB      Knee/Hip Exercises: Seated   Long Arc Quad  Both;Strengthening;3 sets;15 reps    Long Arc Quad Weight  10 lbs.    Other Seated Knee/Hip Exercises  attempted seated BAPS board, but pt unable; ankle circles clockwise/counterclockwise x20 reps each LE    Other Seated Knee/Hip Exercises  seated rocker board x30 reps slow and controlled     Marching  Both;2 sets;10 reps    Marching Weights  10 lbs.          Balance Exercises - 03/28/18 1205      Balance Exercises: Standing   Tandem Stance  Eyes closed;2 reps;30 secs    SLS  Eyes open;Foam/compliant surface;2 reps;30 secs    Other Standing Exercises  tandem on firm surface with cone stack x5 reps each LE forward  PT Education - 03/28/18 1201    Education Details  technique with therex    Person(s) Educated  Patient    Methods  Explanation;Verbal cues;Tactile cues    Comprehension  Verbalized understanding;Returned demonstration       PT Short Term Goals - 03/03/18 1703      PT SHORT TERM GOAL #1   Title  Pt will be independent with his initial HEP to improve LE strength and balance.    Time  4    Period  Weeks    Status  Achieved      PT SHORT TERM GOAL #2   Title  Pt will demo improved gastroc strength, evident by his ability to complete atleast 5 single leg heel raises on each LE.    Baseline  3 on Lt, 15 on Rt     Time  4    Period  Weeks    Status  Achieved        PT Long Term Goals - 03/07/18 1151      PT LONG TERM GOAL #1   Title  Pt will be independent with advanced HEP to decrease risk of deconditioning and future falls after discharge from PT.     Baseline  Pt's wife is unable to do the amount of exercises with her spouse needed to progress and patient would  regress without skilled PT at this time    Time  6    Period  Weeks    Status  On-going    Target Date  04/18/18      PT LONG TERM GOAL #2   Title  Pt will complete 5x sit to stand in less than 14 sec without UE support, to reflect improvements in functional strength and power.     Time  6    Period  Weeks    Status  On-going    Target Date  04/18/18      PT LONG TERM GOAL #3   Title  Pt will be able to complete the TUG in less than 13 sec, with LRAD, to reflect improvements in his balance and stability with ambulation in the community.     Baseline  3 trials: 17, 13, 12    Time  6    Period  Weeks    Status  Partially Met    Target Date  04/18/18      PT LONG TERM GOAL #4   Title  Pt will demonstrate DGI of >19/24 to demonstrate decreased risk of falls    Baseline  12/24 (03/07/18)    Time  6    Period  Weeks    Status  New    Target Date  04/18/18      PT LONG TERM GOAL #5   Title  Improved Berg score to > or = to 50/56 for reduced risk of falls    Baseline  47/56 from 49/56 (03/07/18)    Time  6    Period  Weeks    Status  Partially Met    Target Date  04/18/18            Plan - 03/28/18 1225    Clinical Impression Statement  Continued session with therex to promote LE strength, ankle control and proprioception. Pt was able to maintain single leg balance for up to 9 sec on the RLE and 7 sec on the LLE this session. Attempted to focus on static balance with multitasking and pt did require intermittent  assistance to prevent LOB with this. Pt required intermittent rest breaks during standing activity secondary to LE fatigue from prior strengthening therex. Ended without increase in pain and with noted LE fatigue.     PT Treatment/Interventions  ADLs/Self Care Home Management;Moist Heat;Cryotherapy;Therapeutic activities;Functional mobility training;Stair training;Gait training;Therapeutic exercise;Neuromuscular re-education;Balance training;Patient/family education;Manual  techniques;Passive range of motion    PT Next Visit Plan  KX modifier needeed; Hip and quad strength progression, ankle strength and flexiblility, dynamic balance with dual task activities for improved coordination; progress HEP as able    PT Home Exercise Plan  Access Code: [email or web address]     Consulted and Agree with Plan of Care  Patient       Patient will benefit from skilled therapeutic intervention in order to improve the following deficits and impairments:  Abnormal gait, Decreased activity tolerance, Decreased safety awareness, Decreased strength, Impaired flexibility, Postural dysfunction, Pain, Improper body mechanics, Decreased range of motion, Decreased endurance, Decreased balance, Decreased mobility, Difficulty walking, Hypomobility  Visit Diagnosis: Unsteadiness on feet  History of falling  Muscle weakness (generalized)  Difficulty in walking, not elsewhere classified     Problem List Patient Active Problem List   Diagnosis Date Noted  . Dupuytren's contracture 10/30/2017  . Frequent falls 10/30/2017  . History of closed head injury 07/25/2017  . Osteoarthritis, multiple sites 07/08/2016  . Risk for falls 07/08/2016  . Premature atrial contractions 05/11/2015  . Sepsis ([place]) 04/29/2015  . History of small bowel obstruction 04/29/2015  . History of traumatic brain injury 04/29/2015  . Acute respiratory failure ([place]) 04/29/2015  . CAP (community acquired pneumonia)   . Unspecified cerebral artery occlusion with cerebral infarction 08/25/2012  . Aphasia 08/25/2012  . Macular degeneration 04/25/2012  . CVA (cerebral infarction) 03/20/2012  . Small bowel obstruction ([place]) 09/12/2011  . Nausea & vomiting 09/12/2011  . Memory loss 04/01/2010  . 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 SKIN LESION 10/24/2009  . NEOPLASM, SKIN, UNCERTAIN BEHAVIOR 49/82/6415  . DYSGEUSIA 10/10/2009  . Hyperlipidemia 07/20/2008  . DEPRESSION 07/20/2008  . Essential hypertension  07/20/2008  . RHINITIS 07/20/2008  . COLONIC POLYPS, HX OF 07/20/2008  . GERD 07/18/2008  . PROSTATE CANCER, HX OF 07/18/2008    1:19 PM,03/28/18 Sherol Dade PT, DPT [place] [facility] [address], [place], [place], [address] Phone: [phone]   Fax:  [phone]  Name: ELAI VANWYK MRN: [id number] Date of Birth: 1930/11/07

## 2018-04-05 ENCOUNTER — Ambulatory Visit: Payer: Medicare Other | Attending: Family Medicine | Admitting: Physical Therapy

## 2018-04-05 ENCOUNTER — Encounter: Payer: Self-pay | Admitting: Physical Therapy

## 2018-04-05 DIAGNOSIS — R262 Difficulty in walking, not elsewhere classified: Secondary | ICD-10-CM

## 2018-04-05 DIAGNOSIS — Z9181 History of falling: Secondary | ICD-10-CM

## 2018-04-05 DIAGNOSIS — R2681 Unsteadiness on feet: Secondary | ICD-10-CM

## 2018-04-05 DIAGNOSIS — M6281 Muscle weakness (generalized): Secondary | ICD-10-CM | POA: Diagnosis not present

## 2018-04-05 NOTE — Therapy (Signed)
[facility] [address] [place], [place], [address] Phone: [phone]   Fax:  [phone]  Physical Therapy Treatment  Patient Details  Name: Eric George MRN: [id number] Date of Birth: 10-17-1930 Referring Provider (PT): Carolann Littler, MD    Encounter Date: 04/05/2018  PT End of Session - 04/05/18 1233    Visit Number  32    Date for PT Re-Evaluation  04/18/18    Authorization Type  Medicare A and B; [place] - Visit Number  7    Authorization - Number of Visits  10    PT Start Time  6962    PT Stop Time  1309    PT Time Calculation (min)  39 min    Equipment Utilized During Treatment  Gait belt    Activity Tolerance  No increased pain;Patient tolerated treatment well    Behavior During Therapy  [facility] for tasks assessed/performed       Past Medical History:  Diagnosis Date  . Bowel obstruction ([place])   . CAP (community acquired pneumonia)   . 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 GERD (gastroesophageal reflux disease)   . Hydrocephalus ([place])   . Hyperlipidemia   . Hypertension   . Osteoarthritis, multiple sites 07/08/2016  . 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 Stroke ([place])   . TBI (traumatic brain injury) [facility])     Past Surgical History:  Procedure Laterality Date  . BRAIN SURGERY    . HERNIA REPAIR    . PROSTATECTOMY    . TEE WITHOUT CARDIOVERSION  03/22/2012   Procedure: TRANSESOPHAGEAL ECHOCARDIOGRAM (TEE);  Surgeon: [name], MD;  Location: [facility] ENDOSCOPY;  Service: Cardiovascular;  Laterality: N/A;    There were no vitals filed for this visit.  Subjective Assessment - 04/05/18 1234    Subjective  Pt reports that things are going well. No falls over the holidays.     Patient Stated Goals  decrease risk of falling     Currently in Pain?  No/denies                       [facility] Adult PT Treatment/Exercise -  04/05/18 0001      Exercises   Exercises  Knee/Hip      Knee/Hip Exercises: Stretches   Gastroc Stretch  2 reps;Both;30 seconds    Gastroc Stretch Limitations  standing       Knee/Hip Exercises: Aerobic   Nustep  L3 x4 min end of session for LE cooldown, PT able to discuss progress/areas of concern with pt's spouse       Knee/Hip Exercises: Standing   Other Standing Knee Exercises  hip extension step 2x10 reps each with red TB    Other Standing Knee Exercises  active DF without UE support x20 reps      Knee/Hip Exercises: Seated   Long Arc Quad  Both;Strengthening;3 sets;15 reps    Long Arc Quad Weight  10 lbs.    Other Seated Knee/Hip Exercises  B ankle circles clockwise/counterclockwise x20 reps each     Other Seated Knee/Hip Exercises  seated rocker board x30 reps slow and controlled     Marching  Both;2 sets;10 reps    Marching Weights  10 lbs.          Balance Exercises - 04/05/18 1249      Balance  Exercises: Standing   Standing Eyes Closed  Narrow base of support (BOS);Foam/compliant surface;2 reps   external perturbations    SLS  Eyes open;Foam/compliant surface;30 secs;3 reps   intermittent UE support    Standing, One Foot on a Step  Eyes open;Foam/compliant surface;6 inch;2 reps   cone stacking        PT Education - 04/05/18 1306    Education Details  technique with therex    Person(s) Educated  Patient    Methods  Explanation;Verbal cues    Comprehension  Verbalized understanding;Need further instruction       PT Short Term Goals - 03/03/18 1703      PT SHORT TERM GOAL #1   Title  Pt will be independent with his initial HEP to improve LE strength and balance.    Time  4    Period  Weeks    Status  Achieved      PT SHORT TERM GOAL #2   Title  Pt will demo improved gastroc strength, evident by his ability to complete atleast 5 single leg heel raises on each LE.    Baseline  3 on Lt, 15 on Rt     Time  4    Period  Weeks    Status  Achieved         PT Long Term Goals - 03/07/18 1151      PT LONG TERM GOAL #1   Title  Pt will be independent with advanced HEP to decrease risk of deconditioning and future falls after discharge from PT.     Baseline  Pt's wife is unable to do the amount of exercises with her spouse needed to progress and patient would regress without skilled PT at this time    Time  6    Period  Weeks    Status  On-going    Target Date  04/18/18      PT LONG TERM GOAL #2   Title  Pt will complete 5x sit to stand in less than 14 sec without UE support, to reflect improvements in functional strength and power.     Time  6    Period  Weeks    Status  On-going    Target Date  04/18/18      PT LONG TERM GOAL #3   Title  Pt will be able to complete the TUG in less than 13 sec, with LRAD, to reflect improvements in his balance and stability with ambulation in the community.     Baseline  3 trials: 17, 13, 12    Time  6    Period  Weeks    Status  Partially Met    Target Date  04/18/18      PT LONG TERM GOAL #4   Title  Pt will demonstrate DGI of >19/24 to demonstrate decreased risk of falls    Baseline  12/24 (03/07/18)    Time  6    Period  Weeks    Status  New    Target Date  04/18/18      PT LONG TERM GOAL #5   Title  Improved Berg score to > or = to 50/56 for reduced risk of falls    Baseline  47/56 from 49/56 (03/07/18)    Time  6    Period  Weeks    Status  Partially Met    Target Date  04/18/18  Plan - 04/05/18 1307    Clinical Impression Statement  Today's session focused on therex and balance activity to improve proprioception and balance recovery while multitasking throughout the day. Pt did have several moments of unsteadiness which required therapist assistance to prevent posterior LOB. Pt's balance reactions are significantly slowed which makes a negative impact on his ability to recover during small LOB. Will continue to work on pt awareness, strength and proprioception moving  forward.     PT Treatment/Interventions  ADLs/Self Care Home Management;Moist Heat;Cryotherapy;Therapeutic activities;Functional mobility training;Stair training;Gait training;Therapeutic exercise;Neuromuscular re-education;Balance training;Patient/family education;Manual techniques;Passive range of motion    PT Next Visit Plan  KX modifier needeed; Hip and quad strength progression, ankle strength and flexiblility, dynamic balance with dual task activities for improved coordination; progress HEP as able    PT Home Exercise Plan  Access Code: [email or web address]     Consulted and Agree with Plan of Care  Patient       Patient will benefit from skilled therapeutic intervention in order to improve the following deficits and impairments:  Abnormal gait, Decreased activity tolerance, Decreased safety awareness, Decreased strength, Impaired flexibility, Postural dysfunction, Pain, Improper body mechanics, Decreased range of motion, Decreased endurance, Decreased balance, Decreased mobility, Difficulty walking, Hypomobility  Visit Diagnosis: Unsteadiness on feet  History of falling  Muscle weakness (generalized)  Difficulty in walking, not elsewhere classified     Problem List Patient Active Problem List   Diagnosis Date Noted  . Dupuytren's contracture 10/30/2017  . Frequent falls 10/30/2017  . History of closed head injury 07/25/2017  . Osteoarthritis, multiple sites 07/08/2016  . Risk for falls 07/08/2016  . Premature atrial contractions 05/11/2015  . Sepsis ([place]) 04/29/2015  . History of small bowel obstruction 04/29/2015  . History of traumatic brain injury 04/29/2015  . Acute respiratory failure ([place]) 04/29/2015  . CAP (community acquired pneumonia)   . Unspecified cerebral artery occlusion with cerebral infarction 08/25/2012  . Aphasia 08/25/2012  . Macular degeneration 04/25/2012  . CVA (cerebral infarction) 03/20/2012  . Small bowel obstruction ([place]) 09/12/2011  . Nausea & vomiting  09/12/2011  . Memory loss 04/01/2010  . 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 SKIN LESION 10/24/2009  . NEOPLASM, SKIN, UNCERTAIN BEHAVIOR 02/40/9735  . DYSGEUSIA 10/10/2009  . Hyperlipidemia 07/20/2008  . DEPRESSION 07/20/2008  . Essential hypertension 07/20/2008  . RHINITIS 07/20/2008  . COLONIC POLYPS, HX OF 07/20/2008  . GERD 07/18/2008  . PROSTATE CANCER, HX OF 07/18/2008     1:21 PM,04/05/18 Sherol Dade PT, DPT [place] [facility] [address], [place], [place], [address] Phone: [phone]   Fax:  [phone]  Name: Eric George MRN: [id number] Date of Birth: 03/15/1931

## 2018-04-07 ENCOUNTER — Ambulatory Visit: Payer: Medicare Other | Admitting: Physical Therapy

## 2018-04-07 ENCOUNTER — Encounter: Payer: Self-pay | Admitting: Physical Therapy

## 2018-04-07 DIAGNOSIS — R2681 Unsteadiness on feet: Secondary | ICD-10-CM | POA: Diagnosis not present

## 2018-04-07 DIAGNOSIS — M6281 Muscle weakness (generalized): Secondary | ICD-10-CM | POA: Diagnosis not present

## 2018-04-07 DIAGNOSIS — R262 Difficulty in walking, not elsewhere classified: Secondary | ICD-10-CM

## 2018-04-07 DIAGNOSIS — Z9181 History of falling: Secondary | ICD-10-CM | POA: Diagnosis not present

## 2018-04-07 NOTE — Therapy (Signed)
[facility] [address] [place], [place], [address] Phone: [phone]   Fax:  [phone]  Physical Therapy Treatment  Patient Details  Name: Eric George MRN: [id number] Date of Birth: 1931-05-01 Referring Provider (PT): Carolann Littler, MD    Encounter Date: 04/07/2018  PT End of Session - 04/07/18 1412    Visit Number  33    Date for PT Re-Evaluation  04/18/18    Authorization Type  Medicare A and B; [place] - Visit Number  8    Authorization - Number of Visits  10    PT Start Time  7342    PT Stop Time  8768    PT Time Calculation (min)  42 min    Equipment Utilized During Treatment  Gait belt    Activity Tolerance  No increased pain;Patient tolerated treatment well    Behavior During Therapy  [facility] for tasks assessed/performed       Past Medical History:  Diagnosis Date  . Bowel obstruction ([place])   . CAP (community acquired pneumonia)   . 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 GERD (gastroesophageal reflux disease)   . Hydrocephalus ([place])   . Hyperlipidemia   . Hypertension   . Osteoarthritis, multiple sites 07/08/2016  . 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 Stroke ([place])   . TBI (traumatic brain injury) [facility])     Past Surgical History:  Procedure Laterality Date  . BRAIN SURGERY    . HERNIA REPAIR    . PROSTATECTOMY    . TEE WITHOUT CARDIOVERSION  03/22/2012   Procedure: TRANSESOPHAGEAL ECHOCARDIOGRAM (TEE);  Surgeon: [name], MD;  [facility] ENDOSCOPY;  Service: Cardiovascular;  Laterality: N/A;    There were no vitals filed for this visit.  Subjective Assessment - 04/07/18 1236    Subjective  Pt states things are good. He has not been doing his HEP as much as he should.     Patient Stated Goals  decrease risk of falling     Currently in Pain?  No/denies                       [facility] Adult PT  Treatment/Exercise - 04/07/18 0001      Exercises   Exercises  Knee/Hip      Knee/Hip Exercises: Aerobic   Nustep  L4 x7 min encouraged to maintain SPM above 80      Knee/Hip Exercises: Standing   Knee Flexion  Both;2 sets;15 reps    Knee Flexion Limitations  3#, 1 UE support    Hip Flexion  Both;2 sets;15 reps;Knee bent    Hip Flexion Limitations  3#, 1 UE support    Other Standing Knee Exercises  step over foam roll forward/back  2x5 reps, 3# ankle weights; x1 set without ankle weights x5 reps       Knee/Hip Exercises: Seated   Long Arc Quad  Both;Strengthening;3 sets;15 reps    Long Arc Quad Weight  10 lbs.          Balance Exercises - 04/07/18 1258      Balance Exercises: Standing   Tandem Stance  Eyes open;Intermittent upper extremity support;2 reps;30 secs    SLS  Eyes open;Foam/compliant surface;30 secs;3 reps    Gait with Head Turns  Forward;4 reps   2f one way; up/down, Lt/Rt   Sidestepping  4 reps   72f       PT Education - 04/07/18 1411    Education Details  technique with therex; benefits of gym program with personal trainer to monitor safety, etc     Person(s) Educated  Patient;Spouse    Methods  Explanation    Comprehension  Verbalized understanding;Verbal cues required       PT Short Term Goals - 03/03/18 1703      PT SHORT TERM GOAL #1   Title  Pt will be independent with his initial HEP to improve LE strength and balance.    Time  4    Period  Weeks    Status  Achieved      PT SHORT TERM GOAL #2   Title  Pt will demo improved gastroc strength, evident by his ability to complete atleast 5 single leg heel raises on each LE.    Baseline  3 on Lt, 15 on Rt     Time  4    Period  Weeks    Status  Achieved        PT Long Term Goals - 03/07/18 1151      PT LONG TERM GOAL #1   Title  Pt will be independent with advanced HEP to decrease risk of deconditioning and future falls after discharge from PT.     Baseline  Pt's wife is unable to do  the amount of exercises with her spouse needed to progress and patient would regress without skilled PT at this time    Time  6    Period  Weeks    Status  On-going    Target Date  04/18/18      PT LONG TERM GOAL #2   Title  Pt will complete 5x sit to stand in less than 14 sec without UE support, to reflect improvements in functional strength and power.     Time  6    Period  Weeks    Status  On-going    Target Date  04/18/18      PT LONG TERM GOAL #3   Title  Pt will be able to complete the TUG in less than 13 sec, with LRAD, to reflect improvements in his balance and stability with ambulation in the community.     Baseline  3 trials: 17, 13, 12    Time  6    Period  Weeks    Status  Partially Met    Target Date  04/18/18      PT LONG TERM GOAL #4   Title  Pt will demonstrate DGI of >19/24 to demonstrate decreased risk of falls    Baseline  12/24 (03/07/18)    Time  6    Period  Weeks    Status  New    Target Date  04/18/18      PT LONG TERM GOAL #5   Title  Improved Berg score to > or = to 50/56 for reduced risk of falls    Baseline  47/56 from 49/56 (03/07/18)    Time  6    Period  Weeks    Status  Partially Met    Target Date  04/18/18            Plan - 04/07/18 1414    Clinical Impression Statement  Continued this session with therex and dynamic balance activity. Pt was able to walk forward with slow head movements, but had difficulty when instructed to complete more  rapid head turns. Pt has plans to meet with a local gym for possible transition into a regular gym routine. No LOB or significant unsteadiness were noted during today's session. Will continue with current POC.     PT Treatment/Interventions  ADLs/Self Care Home Management;Moist Heat;Cryotherapy;Therapeutic activities;Functional mobility training;Stair training;Gait training;Therapeutic exercise;Neuromuscular re-education;Balance training;Patient/family education;Manual techniques;Passive range of motion     PT Next Visit Plan  KX modifier needeed; Hip and quad strength progression, ankle strength and flexiblility, dynamic balance with dual task activities for improved coordination; progress HEP as able    PT Home Exercise Plan  Access Code: [email or web address]     Consulted and Agree with Plan of Care  Patient       Patient will benefit from skilled therapeutic intervention in order to improve the following deficits and impairments:  Abnormal gait, Decreased activity tolerance, Decreased safety awareness, Decreased strength, Impaired flexibility, Postural dysfunction, Pain, Improper body mechanics, Decreased range of motion, Decreased endurance, Decreased balance, Decreased mobility, Difficulty walking, Hypomobility  Visit Diagnosis: Unsteadiness on feet  History of falling  Muscle weakness (generalized)  Difficulty in walking, not elsewhere classified     Problem List Patient Active Problem List   Diagnosis Date Noted  . Dupuytren's contracture 10/30/2017  . Frequent falls 10/30/2017  . History of closed head injury 07/25/2017  . Osteoarthritis, multiple sites 07/08/2016  . Risk for falls 07/08/2016  . Premature atrial contractions 05/11/2015  . Sepsis ([place]) 04/29/2015  . History of small bowel obstruction 04/29/2015  . History of traumatic brain injury 04/29/2015  . Acute respiratory failure ([place]) 04/29/2015  . CAP (community acquired pneumonia)   . Unspecified cerebral artery occlusion with cerebral infarction 08/25/2012  . Aphasia 08/25/2012  . Macular degeneration 04/25/2012  . CVA (cerebral infarction) 03/20/2012  . Small bowel obstruction ([place]) 09/12/2011  . Nausea & vomiting 09/12/2011  . Memory loss 04/01/2010  . 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 SKIN LESION 10/24/2009  . NEOPLASM, SKIN, UNCERTAIN BEHAVIOR 57/89/7847  . DYSGEUSIA 10/10/2009  . Hyperlipidemia 07/20/2008  . DEPRESSION 07/20/2008  . Essential hypertension 07/20/2008  . RHINITIS 07/20/2008  .  COLONIC POLYPS, HX OF 07/20/2008  . GERD 07/18/2008  . PROSTATE CANCER, HX OF 07/18/2008    2:46 PM,04/07/18 Jannell Franta PT, DPT [place] [facility] [address], [place], [place], [address] Phone: [phone]   Fax:  [phone]  Name: Eric George MRN: [id number] Date of Birth: 03/11/1931

## 2018-04-12 ENCOUNTER — Encounter: Payer: Self-pay | Admitting: Physical Therapy

## 2018-04-12 ENCOUNTER — Ambulatory Visit: Payer: Medicare Other | Admitting: Physical Therapy

## 2018-04-12 DIAGNOSIS — R262 Difficulty in walking, not elsewhere classified: Secondary | ICD-10-CM

## 2018-04-12 DIAGNOSIS — M6281 Muscle weakness (generalized): Secondary | ICD-10-CM

## 2018-04-12 DIAGNOSIS — R2681 Unsteadiness on feet: Secondary | ICD-10-CM | POA: Diagnosis not present

## 2018-04-12 DIAGNOSIS — Z9181 History of falling: Secondary | ICD-10-CM | POA: Diagnosis not present

## 2018-04-12 NOTE — Therapy (Signed)
[facility] [address], [place], [place], [address] Phone: [phone]   Fax:  [phone]  Physical Therapy Treatment  Patient Details  Name: Eric George MRN: [id number] Date of Birth: 23-Jun-1930 Referring Provider (PT): Carolann Littler, MD    Encounter Date: 04/12/2018  PT End of Session - 04/12/18 1442    Visit Number  34    Date for PT Re-Evaluation  04/18/18    Authorization Type  Medicare A and B; [place] - Visit Number  9    Authorization - Number of Visits  10    PT Start Time  1401    PT Stop Time  1443    PT Time Calculation (min)  42 min    Equipment Utilized During Treatment  Gait belt    Activity Tolerance  No increased pain;Patient tolerated treatment well    Behavior During Therapy  [facility] for tasks assessed/performed       Past Medical History:  Diagnosis Date  . Bowel obstruction ([place])   . CAP (community acquired pneumonia)   . 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 GERD (gastroesophageal reflux disease)   . Hydrocephalus ([place])   . Hyperlipidemia   . Hypertension   . Osteoarthritis, multiple sites 07/08/2016  . 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 Stroke ([place])   . TBI (traumatic brain injury) [facility])     Past Surgical History:  Procedure Laterality Date  . BRAIN SURGERY    . HERNIA REPAIR    . PROSTATECTOMY    . TEE WITHOUT CARDIOVERSION  03/22/2012   Procedure: TRANSESOPHAGEAL ECHOCARDIOGRAM (TEE);  Surgeon: [name], MD;  Location: [facility] ENDOSCOPY;  Service: Cardiovascular;  Laterality: N/A;    There were no vitals filed for this visit.  Subjective Assessment - 04/12/18 1406    Subjective  Pt states that things are going well. He is not sure what to think about the gym he went to last week.     Patient Stated Goals  decrease risk of falling     Currently in Pain?  No/denies                        [facility] Adult PT Treatment/Exercise - 04/12/18 0001      Knee/Hip Exercises: Aerobic   Nustep  L2 x5 min, end of session PT present to ensure proper cool down       Knee/Hip Exercises: Standing   Knee Flexion  Both;2 sets;15 reps    Knee Flexion Limitations  2#, no UE support     Hip Flexion  Both;2 sets;15 reps;Knee bent    Hip Flexion Limitations  2#, no UE support     Other Standing Knee Exercises  step over pool noodle with 2# ankle weights x10 reps each without UE support    Other Standing Knee Exercises  BUE rows with green TB 2x15 reps (2nd set standing on foam pad); BUE extension with red TB 2x15 reps       Knee/Hip Exercises: Seated   Long Arc Quad  Both;Strengthening;3 sets;15 reps    Long Arc Quad Weight  10 lbs.    Other Seated Knee/Hip Exercises  B ankle rockerboard x20 reps     Marching  Both;2 sets;10 reps    Marching Limitations  10# ankle weights  Balance Exercises - 04/12/18 1417      Balance Exercises: Standing   Tandem Stance  Eyes open;2 reps;30 secs        PT Education - 04/12/18 1442    Education Details  technique with therex    Person(s) Educated  Patient    Methods  Explanation;Tactile cues;Verbal cues    Comprehension  Verbalized understanding;Returned demonstration       PT Short Term Goals - 03/03/18 1703      PT SHORT TERM GOAL #1   Title  Pt will be independent with his initial HEP to improve LE strength and balance.    Time  4    Period  Weeks    Status  Achieved      PT SHORT TERM GOAL #2   Title  Pt will demo improved gastroc strength, evident by his ability to complete atleast 5 single leg heel raises on each LE.    Baseline  3 on Lt, 15 on Rt     Time  4    Period  Weeks    Status  Achieved        PT Long Term Goals - 03/07/18 1151      PT LONG TERM GOAL #1   Title  Pt will be independent with advanced HEP to decrease risk of deconditioning and future falls after discharge  from PT.     Baseline  Pt's wife is unable to do the amount of exercises with her spouse needed to progress and patient would regress without skilled PT at this time    Time  6    Period  Weeks    Status  On-going    Target Date  04/18/18      PT LONG TERM GOAL #2   Title  Pt will complete 5x sit to stand in less than 14 sec without UE support, to reflect improvements in functional strength and power.     Time  6    Period  Weeks    Status  On-going    Target Date  04/18/18      PT LONG TERM GOAL #3   Title  Pt will be able to complete the TUG in less than 13 sec, with LRAD, to reflect improvements in his balance and stability with ambulation in the community.     Baseline  3 trials: 17, 13, 12    Time  6    Period  Weeks    Status  Partially Met    Target Date  04/18/18      PT LONG TERM GOAL #4   Title  Pt will demonstrate DGI of >19/24 to demonstrate decreased risk of falls    Baseline  12/24 (03/07/18)    Time  6    Period  Weeks    Status  New    Target Date  04/18/18      PT LONG TERM GOAL #5   Title  Improved Berg score to > or = to 50/56 for reduced risk of falls    Baseline  47/56 from 49/56 (03/07/18)    Time  6    Period  Weeks    Status  Partially Met    Target Date  04/18/18            Plan - 04/12/18 1442    Clinical Impression Statement  Pt was able to meet with trainer at the local gym for possible transition to this program following d/c from  PT. Session focused on therex to promote LE strength and endurance in standing positions, with intermittent rest breaks provided during his session. Pt does become fatigued and requires combination of seated and standing exercises. Will continue with current POC.     PT Treatment/Interventions  ADLs/Self Care Home Management;Moist Heat;Cryotherapy;Therapeutic activities;Functional mobility training;Stair training;Gait training;Therapeutic exercise;Neuromuscular re-education;Balance training;Patient/family  education;Manual techniques;Passive range of motion    PT Next Visit Plan  KX modifier needeed; progress note next visit; Hip and quad strength progression, ankle strength and flexiblility, dynamic balance with dual task activities for improved coordination; progress HEP as able    PT Home Exercise Plan  Access Code: [email or web address]     Consulted and Agree with Plan of Care  Patient       Patient will benefit from skilled therapeutic intervention in order to improve the following deficits and impairments:  Abnormal gait, Decreased activity tolerance, Decreased safety awareness, Decreased strength, Impaired flexibility, Postural dysfunction, Pain, Improper body mechanics, Decreased range of motion, Decreased endurance, Decreased balance, Decreased mobility, Difficulty walking, Hypomobility  Visit Diagnosis: Unsteadiness on feet  History of falling  Muscle weakness (generalized)  Difficulty in walking, not elsewhere classified     Problem List Patient Active Problem List   Diagnosis Date Noted  . Dupuytren's contracture 10/30/2017  . Frequent falls 10/30/2017  . History of closed head injury 07/25/2017  . Osteoarthritis, multiple sites 07/08/2016  . Risk for falls 07/08/2016  . Premature atrial contractions 05/11/2015  . Sepsis ([place]) 04/29/2015  . History of small bowel obstruction 04/29/2015  . History of traumatic brain injury 04/29/2015  . Acute respiratory failure ([place]) 04/29/2015  . CAP (community acquired pneumonia)   . Unspecified cerebral artery occlusion with cerebral infarction 08/25/2012  . Aphasia 08/25/2012  . Macular degeneration 04/25/2012  . CVA (cerebral infarction) 03/20/2012  . Small bowel obstruction ([place]) 09/12/2011  . Nausea & vomiting 09/12/2011  . Memory loss 04/01/2010  . 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 SKIN LESION 10/24/2009  . NEOPLASM, SKIN, UNCERTAIN BEHAVIOR 77/82/4235  . DYSGEUSIA 10/10/2009  . Hyperlipidemia 07/20/2008  . DEPRESSION  07/20/2008  . Essential hypertension 07/20/2008  . RHINITIS 07/20/2008  . COLONIC POLYPS, HX OF 07/20/2008  . GERD 07/18/2008  . PROSTATE CANCER, HX OF 07/18/2008    3:27 PM,04/12/18 Sherol Dade PT, DPT [place] [facility] [address] [place], [place], [address] Phone: [phone]   Fax:  [phone]  Name: Eric George MRN: [id number] Date of Birth: 12/17/1930

## 2018-04-13 ENCOUNTER — Other Ambulatory Visit: Payer: Self-pay

## 2018-04-13 ENCOUNTER — Encounter: Payer: Self-pay | Admitting: Family Medicine

## 2018-04-13 ENCOUNTER — Ambulatory Visit (INDEPENDENT_AMBULATORY_CARE_PROVIDER_SITE_OTHER): Payer: Medicare Other

## 2018-04-13 ENCOUNTER — Ambulatory Visit (INDEPENDENT_AMBULATORY_CARE_PROVIDER_SITE_OTHER): Payer: Medicare Other | Admitting: Family Medicine

## 2018-04-13 VITALS — BP 110/76 | HR 76 | Temp 97.7°F | Ht 74.0 in | Wt 183.6 lb

## 2018-04-13 DIAGNOSIS — R05 Cough: Secondary | ICD-10-CM

## 2018-04-13 DIAGNOSIS — Z9181 History of falling: Secondary | ICD-10-CM | POA: Diagnosis not present

## 2018-04-13 DIAGNOSIS — R053 Chronic cough: Secondary | ICD-10-CM

## 2018-04-13 NOTE — Progress Notes (Signed)
Subjective:     Patient ID: Eric George, male   DOB: 1931/02/08, 84 y.o.   MRN: [id number]  HPI  Patient seen for follow-up.  He had been losing some weight when we saw him 2 months ago but this seems to have stabilized.  No changes in appetite.  He has had some chronic cough since summer.  He quit smoking 1965 after about 10-year history.  No hemoptysis.  No pleuritic pain.  No fevers or chills.  He has history of GERD and is on chronic PPI.  No obvious GERD symptoms.  No postnasal drip symptoms.  No history of asthma.  No ACE inhibitor use.  He had another fall since last seen here and had injury to hand but x-ray showed no fracture.  He is still engaged in physical therapy.  His wife is looking at another program where he would see a physical trainer regularly for some ongoing exercise  Past Medical History:  Diagnosis Date  . Bowel obstruction ([place])   . CAP (community acquired pneumonia)   . 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 GERD (gastroesophageal reflux disease)   . Hydrocephalus ([place])   . Hyperlipidemia   . Hypertension   . Osteoarthritis, multiple sites 07/08/2016  . 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 Stroke ([place])   . TBI (traumatic brain injury) [facility])    Past Surgical History:  Procedure Laterality Date  . BRAIN SURGERY    . HERNIA REPAIR    . PROSTATECTOMY    . TEE WITHOUT CARDIOVERSION  03/22/2012   Procedure: TRANSESOPHAGEAL ECHOCARDIOGRAM (TEE);  Surgeon: [name], MD;  Location: [facility] ENDOSCOPY;  Service: Cardiovascular;  Laterality: N/A;    reports that he quit smoking about 51 years ago. His smoking use included cigarettes. He has a 7.50 pack-year smoking history. He has never used smokeless tobacco. He reports that he does not drink alcohol or use drugs. family history includes Atrial fibrillation in his sister; Heart disease in his sister; Uterine cancer in his sister. Allergies  Allergen Reactions  .  Penicillins Rash    Review of Systems  Constitutional: Negative for chills and fever.  Respiratory: Positive for cough. Negative for shortness of breath and wheezing.   Cardiovascular: Negative for chest pain.  Neurological: Negative for dizziness, seizures and syncope.       Objective:   Physical Exam Constitutional:      Appearance: He is well-developed and well-nourished.  HENT:     Mouth/Throat:     Mouth: Oropharynx is clear and moist.  Cardiovascular:     Rate and Rhythm: Normal rate and regular rhythm.  Pulmonary:     Effort: Pulmonary effort is normal.     Breath sounds: Normal breath sounds.  Musculoskeletal:        General: No edema.  Psychiatric:        Mood and Affect: Mood and affect normal.        Assessment:     #1 chronic cough.  Several months duration.  Does not have any red flags such as dyspnea, fever, hemoptysis.  Remote hx of smoking.  #2 history of frequent falls    Plan:     -Obtain chest x-ray given duration of cough symptoms -Continue with physical therapy and strength/balance training -Routine follow-up in 6 months and sooner as needed  Eulas Post MD [facility]

## 2018-04-14 ENCOUNTER — Ambulatory Visit: Payer: Medicare Other | Admitting: Physical Therapy

## 2018-04-14 ENCOUNTER — Encounter: Payer: Self-pay | Admitting: Physical Therapy

## 2018-04-14 DIAGNOSIS — Z9181 History of falling: Secondary | ICD-10-CM | POA: Diagnosis not present

## 2018-04-14 DIAGNOSIS — M6281 Muscle weakness (generalized): Secondary | ICD-10-CM | POA: Diagnosis not present

## 2018-04-14 DIAGNOSIS — R2681 Unsteadiness on feet: Secondary | ICD-10-CM | POA: Diagnosis not present

## 2018-04-14 DIAGNOSIS — R262 Difficulty in walking, not elsewhere classified: Secondary | ICD-10-CM | POA: Diagnosis not present

## 2018-04-14 NOTE — Patient Instructions (Signed)
Access Code: [email or web address]  URL: [email or web address]  Date: 04/14/2018  Prepared by: Sherol Dade   Exercises  Heel Raise - 20 reps - 1 sets - 3x daily - 7x weekly  Clamshell with Resistance - 10 reps - 3 sets - 2x daily - 7x weekly  Toe Raises with Counter Support - 20 reps - 1 sets - 2x daily - 7x weekly  Side Stepping with Resistance at Feet - 10 reps - 3 sets - 1x daily - 7x weekly  Tandem Stance - 10 reps - 3 sets - 1x daily - 7x weekly  Seated Hip Abduction - 10-15 reps - 2 sets - 1x daily - 7x weekly  Hip Extension with Resistance Loop - 10 reps - 3 sets - 1x daily - 7x weekly  Standing Bilateral Low Shoulder Row with Anchored Resistance - 10 reps - 3 sets - 1x daily - 7x weekly    [facility] [address], [place],  [address] Phone # [phone] Fax [phone]

## 2018-04-14 NOTE — Therapy (Signed)
[facility] [address] [place], [place], [address] Phone: [phone]   Fax:  [phone]  Physical Therapy Treatment/progress note   Patient Details  Name: Eric George MRN: [id number] Date of Birth: [birth year] Referring Provider (PT): Carolann Littler, MD   Progress Note Reporting Period 03/07/18 to 04/14/18  See note below for Objective Data and Assessment of Progress/Goals.      Encounter Date: 04/14/2018  PT End of Session - 04/14/18 1447    Visit Number  35    Date for PT Re-Evaluation  04/18/18    Authorization Type  Medicare A and B; [place] - Visit Number  10    Authorization - Number of Visits  10    PT Start Time  1401    PT Stop Time  1440    PT Time Calculation (min)  39 min    Equipment Utilized During Treatment  Gait belt    Activity Tolerance  No increased pain;Patient tolerated treatment well    Behavior During Therapy  [facility] for tasks assessed/performed       Past Medical History:  Diagnosis Date  . Bowel obstruction ([place])   . CAP (community acquired pneumonia)   . 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 Colloid cyst of brain ([place])   . GERD (gastroesophageal reflux disease)   . Hydrocephalus ([place])   . Hyperlipidemia   . Hypertension   . Osteoarthritis, multiple sites 07/08/2016  . 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 Stroke ([place])   . TBI (traumatic brain injury) [facility])     Past Surgical History:  Procedure Laterality Date  . BRAIN SURGERY    . HERNIA REPAIR    . PROSTATECTOMY    . TEE WITHOUT CARDIOVERSION  03/22/2012   Procedure: TRANSESOPHAGEAL ECHOCARDIOGRAM (TEE);  Surgeon: [name], MD;  Location: [facility] ENDOSCOPY;  Service: Cardiovascular;  Laterality: N/A;    There were no vitals filed for this visit.  Subjective Assessment - 04/14/18 1412    Subjective  Pt has no complaints at this time.    Patient Stated Goals  decrease risk of falling      Currently in Pain?  No/denies                       [facility] Adult PT Treatment/Exercise - 04/14/18 0001      Knee/Hip Exercises: Aerobic   Nustep  L3 x6 min      Knee/Hip Exercises: Standing   Heel Raises  Both;1 set;20 reps    Heel Raises Limitations  on half foam roll     Other Standing Knee Exercises  alternating LE marches 2x20 reps    Other Standing Knee Exercises  step over pool (forward and lateral) noodle and back alternating LEs 2x10 reps; BUE rows with green TB x20 reps on foam pad, x20 reps tandem stance      Knee/Hip Exercises: Seated   Long Arc Quad  Both;Strengthening;3 sets;15 reps    Long Arc Quad Weight  10 lbs.    Marching  Both;2 sets;10 reps    Marching Limitations  pt encouraged to move slowly    Marching Weights  10 lbs.             PT Education - 04/14/18 1447    Education Details  updated HEP    Person(s) Educated  Patient;Spouse    Methods  Explanation;Verbal cues;Handout  Comprehension  Verbalized understanding;Returned demonstration       PT Short Term Goals - 03/03/18 1703      PT SHORT TERM GOAL #1   Title  Pt will be independent with his initial HEP to improve LE strength and balance.    Time  4    Period  Weeks    Status  Achieved      PT SHORT TERM GOAL #2   Title  Pt will demo improved gastroc strength, evident by his ability to complete atleast 5 single leg heel raises on each LE.    Baseline  3 on Lt, 15 on Rt     Time  4    Period  Weeks    Status  Achieved        PT Long Term Goals - 03/07/18 1151      PT LONG TERM GOAL #1   Title  Pt will be independent with advanced HEP to decrease risk of deconditioning and future falls after discharge from PT.     Baseline  Pt's wife is unable to do the amount of exercises with her spouse needed to progress and patient would regress without skilled PT at this time    Time  6    Period  Weeks    Status  On-going    Target Date  04/18/18      PT LONG TERM GOAL  #2   Title  Pt will complete 5x sit to stand in less than 14 sec without UE support, to reflect improvements in functional strength and power.     Time  6    Period  Weeks    Status  On-going    Target Date  04/18/18      PT LONG TERM GOAL #3   Title  Pt will be able to complete the TUG in less than 13 sec, with LRAD, to reflect improvements in his balance and stability with ambulation in the community.     Baseline  3 trials: 17, 13, 12    Time  6    Period  Weeks    Status  Partially Met    Target Date  04/18/18      PT LONG TERM GOAL #4   Title  Pt will demonstrate DGI of >19/24 to demonstrate decreased risk of falls    Baseline  12/24 (03/07/18)    Time  6    Period  Weeks    Status  New    Target Date  04/18/18      PT LONG TERM GOAL #5   Title  Improved Berg score to > or = to 50/56 for reduced risk of falls    Baseline  47/56 from 49/56 (03/07/18)    Time  6    Period  Weeks    Status  Partially Met    Target Date  04/18/18            Plan - 04/14/18 1506    Clinical Impression Statement  Pt's HEP was updated this session to promote standing trunk strength. He did require initial cuing with technique, but demonstrated good understanding end of session. He continues to maintain levels of balance and strength that have improved from his most recent re-evaluation. Pt does demonstrate fluctuations in his balance and awareness of his surroundings from sessions to session, which appears to be making minimal progress over the past several visits. Pt has been able to meet with a local gym trainer  and will likely plan for transition to a gym routine with a personal trainer after discharge from PT. Will plan to finalize his flexibility and strengthening program in the next 2 visits and prepare for discharge.     PT Treatment/Interventions  ADLs/Self Care Home Management;Moist Heat;Cryotherapy;Therapeutic activities;Functional mobility training;Stair training;Gait  training;Therapeutic exercise;Neuromuscular re-education;Balance training;Patient/family education;Manual techniques;Passive range of motion    PT Next Visit Plan  KX modifier needeed; Hip and quad strength progression, ankle strength and flexiblility, dynamic balance with dual task activities for improved coordination; progress HEP as able    PT Home Exercise Plan  Access Code: [email or web address]     Consulted and Agree with Plan of Care  Patient       Patient will benefit from skilled therapeutic intervention in order to improve the following deficits and impairments:  Abnormal gait, Decreased activity tolerance, Decreased safety awareness, Decreased strength, Impaired flexibility, Postural dysfunction, Pain, Improper body mechanics, Decreased range of motion, Decreased endurance, Decreased balance, Decreased mobility, Difficulty walking, Hypomobility  Visit Diagnosis: Unsteadiness on feet  History of falling  Muscle weakness (generalized)  Difficulty in walking, not elsewhere classified     Problem List Patient Active Problem List   Diagnosis Date Noted  . Dupuytren's contracture 10/30/2017  . Frequent falls 10/30/2017  . History of closed head injury 07/25/2017  . Osteoarthritis, multiple sites 07/08/2016  . Risk for falls 07/08/2016  . Premature atrial contractions 05/11/2015  . Sepsis ([place]) 04/29/2015  . History of small bowel obstruction 04/29/2015  . History of traumatic brain injury 04/29/2015  . Acute respiratory failure ([place]) 04/29/2015  . CAP (community acquired pneumonia)   . Unspecified cerebral artery occlusion with cerebral infarction 08/25/2012  . Aphasia 08/25/2012  . Macular degeneration 04/25/2012  . CVA (cerebral infarction) 03/20/2012  . Small bowel obstruction ([place]) 09/12/2011  . Nausea & vomiting 09/12/2011  . Memory loss 04/01/2010  . 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 SKIN LESION 10/24/2009  . NEOPLASM, SKIN, UNCERTAIN BEHAVIOR 37/85/8850  .  DYSGEUSIA 10/10/2009  . Hyperlipidemia 07/20/2008  . DEPRESSION 07/20/2008  . Essential hypertension 07/20/2008  . RHINITIS 07/20/2008  . COLONIC POLYPS, HX OF 07/20/2008  . GERD 07/18/2008  . PROSTATE CANCER, HX OF 07/18/2008    3:29 PM,04/14/18 Sherol Dade PT, DPT [place] [facility] [address] [place], [place], [address] Phone: [phone]   Fax:  [phone]  Name: DEE MADAY MRN: [id number] Date of Birth: Oct 01, 1930

## 2018-04-15 ENCOUNTER — Other Ambulatory Visit: Payer: Self-pay

## 2018-04-15 MED ORDER — LEVOFLOXACIN 500 MG PO TABS: 500.0000 mg | ORAL_TABLET | Freq: Every day | ORAL | 0 refills | Status: DC

## 2018-04-17 ENCOUNTER — Emergency Department (HOSPITAL_COMMUNITY)
Admission: EM | Admit: 2018-04-17 | Discharge: 2018-04-17 | Disposition: A | Discharge status: Home / Self Care | Payer: Medicare Other | Attending: Emergency Medicine | Admitting: Emergency Medicine

## 2018-04-17 ENCOUNTER — Encounter (HOSPITAL_COMMUNITY): Payer: Self-pay

## 2018-04-17 ENCOUNTER — Ambulatory Visit (HOSPITAL_COMMUNITY)
Admission: EM | Admit: 2018-04-17 | Discharge: 2018-04-17 | Disposition: A | Discharge status: Nursing Home (Includes ICF) | Payer: Medicare Other | Source: Home / Self Care

## 2018-04-17 DIAGNOSIS — Z87891 Personal history of nicotine dependence: Secondary | ICD-10-CM | POA: Insufficient documentation

## 2018-04-17 DIAGNOSIS — I10 Essential (primary) hypertension: Secondary | ICD-10-CM | POA: Diagnosis not present

## 2018-04-17 DIAGNOSIS — Z7982 Long term (current) use of aspirin: Secondary | ICD-10-CM | POA: Diagnosis not present

## 2018-04-17 DIAGNOSIS — R339 Retention of urine, unspecified: Secondary | ICD-10-CM | POA: Diagnosis not present

## 2018-04-17 DIAGNOSIS — Z79899 Other long term (current) drug therapy: Secondary | ICD-10-CM | POA: Diagnosis not present

## 2018-04-17 DIAGNOSIS — Z87448 Personal history of other diseases of urinary system: Secondary | ICD-10-CM | POA: Insufficient documentation

## 2018-04-17 DIAGNOSIS — Z8546 Personal history of malignant neoplasm of prostate: Secondary | ICD-10-CM | POA: Diagnosis not present

## 2018-04-17 DIAGNOSIS — R39198 Other difficulties with micturition: Secondary | ICD-10-CM | POA: Insufficient documentation

## 2018-04-17 LAB — URINALYSIS, ROUTINE W REFLEX MICROSCOPIC
Bacteria, UA: NONE SEEN
Bilirubin Urine: NEGATIVE
Glucose, UA: NEGATIVE mg/dL
Ketones, ur: NEGATIVE mg/dL
Nitrite: NEGATIVE
Protein, ur: NEGATIVE mg/dL
Specific Gravity, Urine: 1.019 (ref 1.005–1.030)
pH: 5 (ref 5.0–8.0)

## 2018-04-17 LAB — I-STAT CHEM 8, ED
BUN: 20 mg/dL (ref 8–23)
Calcium, Ion: 1.18 mmol/L (ref 1.15–1.40)
Chloride: 110 mmol/L (ref 98–111)
Creatinine, Ser: 0.9 mg/dL (ref 0.61–1.24)
Glucose, Bld: 87 mg/dL (ref 70–99)
HCT: 41 % (ref 39.0–52.0)
Hemoglobin: 13.9 g/dL (ref 13.0–17.0)
Potassium: 3.9 mmol/L (ref 3.5–5.1)
Sodium: 139 mmol/L (ref 135–145)
TCO2: 20 mmol/L — ABNORMAL LOW (ref 22–32)

## 2018-04-17 MED ORDER — SODIUM CHLORIDE 0.9 % IV BOLUS
1000.0000 mL | Freq: Once | INTRAVENOUS | Status: AC
  Administered 2018-04-17: 1000 mL via INTRAVENOUS

## 2018-04-17 NOTE — ED Triage Notes (Signed)
The patient reported that he has been urinary obstructed since last night with very minimal urinary output. The patient reported to be post prostatectomy in the past with no known urinary issues since. The patient stated that he felt bloated and his lower abdomen was rigid to palpation. The patient was advised to proceed to the ED for probable difficult urinary catheterization and bladder scan per H. [place], PAC

## 2018-04-17 NOTE — ED Provider Notes (Signed)
[facility] DEPT Provider Note   CSN: [id number] Arrival date & time: 04/17/18  1156     History   Chief Complaint Chief Complaint  Patient presents with  . Urinary Retention    HPI Eric George is a 84 y.o. male.  HPI Patient is an 75 year old whose wife feels like he has had decreased urinary output over the past 12 hours.  Pt feels somewhat swollen in his lower abdomen and thinks he may have urinary obstruction.  Has had this before in the past.  Denies fevers and chills.  Denies nausea vomiting.  Some decreased oral intake today.  Denies dysuria.  No urinary frequency.  No fevers or chills.   Past Medical History:  Diagnosis Date  . Bowel obstruction ([place])   . CAP (community acquired pneumonia)   . 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 GERD (gastroesophageal reflux disease)   . Hydrocephalus ([place])   . Hyperlipidemia   . Hypertension   . Osteoarthritis, multiple sites 07/08/2016  . 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 Stroke ([place])   . TBI (traumatic brain injury) [facility])     Patient Active Problem List   Diagnosis Date Noted  . Dupuytren's contracture 10/30/2017  . Frequent falls 10/30/2017  . History of closed head injury 07/25/2017  . Osteoarthritis, multiple sites 07/08/2016  . Risk for falls 07/08/2016  . Premature atrial contractions 05/11/2015  . Sepsis ([place]) 04/29/2015  . History of small bowel obstruction 04/29/2015  . History of traumatic brain injury 04/29/2015  . Acute respiratory failure ([place]) 04/29/2015  . CAP (community acquired pneumonia)   . Unspecified cerebral artery occlusion with cerebral infarction 08/25/2012  . Aphasia 08/25/2012  . Macular degeneration 04/25/2012  . CVA (cerebral infarction) 03/20/2012  . Small bowel obstruction ([place]) 09/12/2011  . Nausea & vomiting 09/12/2011  . Memory loss 04/01/2010  . 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 SKIN  LESION 10/24/2009  . NEOPLASM, SKIN, UNCERTAIN BEHAVIOR 99/35/7017  . DYSGEUSIA 10/10/2009  . Hyperlipidemia 07/20/2008  . DEPRESSION 07/20/2008  . Essential hypertension 07/20/2008  . RHINITIS 07/20/2008  . COLONIC POLYPS, HX OF 07/20/2008  . GERD 07/18/2008  . PROSTATE CANCER, HX OF 07/18/2008    Past Surgical History:  Procedure Laterality Date  . BRAIN SURGERY    . HERNIA REPAIR    . PROSTATECTOMY    . TEE WITHOUT CARDIOVERSION  03/22/2012   Procedure: TRANSESOPHAGEAL ECHOCARDIOGRAM (TEE);  Surgeon: [name], MD;  Location: [facility] ENDOSCOPY;  Service: Cardiovascular;  Laterality: N/A;        Home Medications    Prior to Admission medications   Medication Sig Start Date End Date Taking? Authorizing Provider  acetaminophen (TYLENOL) 325 MG tablet Take 325-650 mg by mouth every 6 (six) hours as needed for mild pain.    Yes [provider]  acetic acid-hydrocortisone (VOSOL-HC) otic solution Place 5 drops into both ears every 30 (thirty) days.  02/06/14  Yes [provider]  aspirin 325 MG tablet Take 1 tablet (325 mg total) by mouth daily. 03/23/12  Yes Eugenie Filler, MD  atorvastatin (LIPITOR) 40 MG tablet TAKE 1 TABLET DAILY AT 6PM Patient taking differently: Take 40 mg by mouth daily at 6 PM.  01/24/18  Yes Burchette, Alinda Sierras, MD  carvedilol (COREG) 3.125 MG tablet TAKE 1 TABLET TWICE A DAY  WITH MEALS Patient taking differently: Take 3.125 mg  by mouth 2 (two) times daily with a meal.  02/21/18  Yes Burchette, Alinda Sierras, MD  irbesartan (AVAPRO) 150 MG tablet Take 1 tablet (150 mg total) by mouth daily. Patient taking differently: Take 150 mg by mouth every evening.  01/06/18  Yes Burchette, Alinda Sierras, MD  levofloxacin (LEVAQUIN) 500 MG tablet Take 1 tablet (500 mg total) by mouth daily. 04/15/18  Yes Burchette, Alinda Sierras, MD  Multiple Vitamins-Minerals (MACULAR VITAMIN BENEFIT) TABS Take 1 capsule by mouth daily.   Yes [provider]  omeprazole  (PRILOSEC) 40 MG capsule Take 40 mg by mouth daily.   Yes [provider]  Probiotic Product (ALIGN) 4 MG CAPS Take 1 capsule by mouth daily.   Yes [provider]  sertraline (ZOLOFT) 100 MG tablet TAKE 1 TABLET DAILY.       (PLEASE SCHEDULE A PHYSICALFOR MORE REFILLS. THANKS.) Patient taking differently: Take 100 mg by mouth daily.  02/21/18  Yes Burchette, Alinda Sierras, MD    Family History Family History  Problem Relation Age of Onset  . Heart disease Sister   . Atrial fibrillation Sister   . Uterine cancer Sister     Social History Social History   Tobacco Use  . Smoking status: Former Smoker    Packs/day: 0.50    Years: 15.00    Pack years: 7.50    Types: Cigarettes    Last attempt to quit: 09/30/1966    Years since quitting: 51.5  . Smokeless tobacco: Never Used  Substance Use Topics  . Alcohol use: No    Alcohol/week: 0.0 standard drinks  . Drug use: No     Allergies   Penicillins   Review of Systems Review of Systems  All other systems reviewed and are negative.    Physical Exam Updated Vital Signs BP (!) 138/111   Pulse 61   Temp (!) 97.4 F (36.3 C) (Oral)   Resp 17   SpO2 99%   Physical Exam Vitals signs and nursing note reviewed.  Constitutional:      Appearance: He is well-developed.  HENT:     Head: Normocephalic and atraumatic.  Neck:     Musculoskeletal: Normal range of motion.  Cardiovascular:     Rate and Rhythm: Normal rate and regular rhythm.     Heart sounds: Normal heart sounds.  Pulmonary:     Effort: Pulmonary effort is normal. No respiratory distress.     Breath sounds: Normal breath sounds.  Abdominal:     General: There is no distension.     Palpations: Abdomen is soft.     Tenderness: There is no abdominal tenderness.  Musculoskeletal: Normal range of motion.  Skin:    General: Skin is warm and dry.  Neurological:     Mental Status: He is alert and oriented to person, place, and time.  Psychiatric:          Judgment: Judgment normal.      ED Treatments / Results  Labs (all labs ordered are listed, but only abnormal results are displayed) Labs Reviewed  URINALYSIS, ROUTINE W REFLEX MICROSCOPIC - Abnormal; Notable for the following components:      Result Value   Hgb urine dipstick SMALL (*)    Leukocytes, UA MODERATE (*)    All other components within normal limits  I-STAT CHEM 8, ED - Abnormal; Notable for the following components:   TCO2 20 (*)    All other components within normal limits  URINE CULTURE  EKG None  Radiology No results found.  Procedures Procedures (including critical care time)  Medications Ordered in ED Medications  sodium chloride 0.9 % bolus 1,000 mL (1,000 mLs Intravenous New Bag/Given 04/17/18 1438)     Initial Impression / Assessment and Plan / ED Course  I have reviewed the triage vital signs and the nursing notes.  Pertinent labs & imaging results that were available during my care of the patient were reviewed by me and considered in my medical decision making (see chart for details).     No significant urine found on bladder scan however given patient's complaints a Foley catheter was placed to be reassured.  No significant urinary output.  Fluids given.  Overall well-appearing.  Nontoxic.  Patient now that he is receiving IV fluids is making urine.  His creatinine is normal.  His electrolytes are normal.  Encouraged ongoing oral hydration at home.  No indication for additional work-up at this time.  Primary care follow-up.  Foley catheter will be removed prior to discharge.  Final Clinical Impressions(s) / ED Diagnoses   Final diagnoses:  History of changes in urinary output    ED Discharge Orders    None       Jola Schmidt, MD 04/17/18 1524

## 2018-04-17 NOTE — ED Notes (Signed)
Bladder Scan #1- 23 mL Bladder Scan #2-19 mL Bladder Scan#3-0 mL

## 2018-04-17 NOTE — ED Triage Notes (Signed)
Patient sent over from urgent care. Patient c/o trouble urinating since last night. This morning patient still unable to urinate and states "just a dribble". Patient c/o burning while voiding.  Patient has taken tylenol. After leaving urgent care patient was about to void a small amount.    Hx. Head injury

## 2018-04-18 ENCOUNTER — Ambulatory Visit: Payer: Self-pay | Admitting: *Deleted

## 2018-04-18 LAB — URINE CULTURE: Culture: NO GROWTH

## 2018-04-18 NOTE — Telephone Encounter (Signed)
Not aware of this (Levaquin) causing urinary retention

## 2018-04-18 NOTE — Telephone Encounter (Signed)
Called patient and spoke to his wife and gave her the message from Dr. Elease Hashimoto. They will be here tomorrow for his appointment.

## 2018-04-18 NOTE — Telephone Encounter (Signed)
Patient was seen in the ED yesterday for difficulty voiding. IV fluids given, bladder scan and foley placed. He began to have increased output after the IV fluids.  UA showed moderate leukocytes. Patient had started taking Levaquin 500 MG/day for a chronic cough, within 12-15 hours of complaining of decreased urine output.Wife questioning if it could have been the levaquin that caused the decreased output. Denies fever/abdominal/Flank pain and no difficulty voiding last night and this am. Advised to continue taking the Levaquin as prescribed and to continue to increase fluid intake. Reviewed signs that would require immediate evaluation. Stated she understood. Has a follow-up with Dr. Elease Hashimoto tomorrow unless anything arises before then. Assured wife I would forward this note to Dr. Elease Hashimoto.  Reason for Disposition . Caller has medication question only, adult not sick, and triager answers question  Answer Assessment - Initial Assessment Questions 1. SYMPTOMS: "Do you have any symptoms?"     no 2. SEVERITY: If symptoms are present, ask "Are they mild, moderate or severe?"  Protocols used: MEDICATION QUESTION CALL-A-AH

## 2018-04-18 NOTE — Telephone Encounter (Signed)
Please see message.  Please advise. 

## 2018-04-19 ENCOUNTER — Ambulatory Visit (INDEPENDENT_AMBULATORY_CARE_PROVIDER_SITE_OTHER): Payer: Medicare Other | Admitting: Family Medicine

## 2018-04-19 ENCOUNTER — Other Ambulatory Visit: Payer: Self-pay

## 2018-04-19 ENCOUNTER — Ambulatory Visit: Payer: Medicare Other | Admitting: Physical Therapy

## 2018-04-19 ENCOUNTER — Encounter: Payer: Self-pay | Admitting: Family Medicine

## 2018-04-19 ENCOUNTER — Encounter: Payer: Self-pay | Admitting: Physical Therapy

## 2018-04-19 VITALS — BP 120/62 | HR 99 | Temp 97.9°F | Ht 74.0 in | Wt 183.0 lb

## 2018-04-19 DIAGNOSIS — M6281 Muscle weakness (generalized): Secondary | ICD-10-CM | POA: Diagnosis not present

## 2018-04-19 DIAGNOSIS — Z9181 History of falling: Secondary | ICD-10-CM

## 2018-04-19 DIAGNOSIS — R9389 Abnormal findings on diagnostic imaging of other specified body structures: Secondary | ICD-10-CM | POA: Diagnosis not present

## 2018-04-19 DIAGNOSIS — R2681 Unsteadiness on feet: Secondary | ICD-10-CM

## 2018-04-19 DIAGNOSIS — R059 Cough, unspecified: Secondary | ICD-10-CM

## 2018-04-19 DIAGNOSIS — R34 Anuria and oliguria: Secondary | ICD-10-CM | POA: Diagnosis not present

## 2018-04-19 DIAGNOSIS — R262 Difficulty in walking, not elsewhere classified: Secondary | ICD-10-CM | POA: Diagnosis not present

## 2018-04-19 DIAGNOSIS — R05 Cough: Secondary | ICD-10-CM | POA: Diagnosis not present

## 2018-04-19 NOTE — Patient Instructions (Signed)
Return early January for repeat CXR.

## 2018-04-19 NOTE — Therapy (Signed)
[facility] [address] [place], [place], [address] Phone: [phone]   Fax:  [phone]  Physical Therapy Treatment/Discharge  Patient Details  Name: Eric George MRN: [id number] Date of Birth: Apr 22, 1931 Referring Provider (PT): Carolann Littler, MD    Encounter Date: 04/19/2018  PT End of Session - 04/19/18 1507    Visit Number  36    Date for PT Re-Evaluation  04/18/18    Authorization Type  Medicare A and B; [place] - Visit Number  11    Authorization - Number of Visits  10    PT Start Time  1401    PT Stop Time  1440    PT Time Calculation (min)  39 min    Equipment Utilized During Treatment  Gait belt    Activity Tolerance  No increased pain;Patient tolerated treatment well    Behavior During Therapy  [facility] for tasks assessed/performed       Past Medical History:  Diagnosis Date  . Bowel obstruction ([place])   . CAP (community acquired pneumonia)   . 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 GERD (gastroesophageal reflux disease)   . Hydrocephalus ([place])   . Hyperlipidemia   . Hypertension   . Osteoarthritis, multiple sites 07/08/2016  . 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 Stroke ([place])   . TBI (traumatic brain injury) [facility])     Past Surgical History:  Procedure Laterality Date  . BRAIN SURGERY    . HERNIA REPAIR    . PROSTATECTOMY    . TEE WITHOUT CARDIOVERSION  03/22/2012   Procedure: TRANSESOPHAGEAL ECHOCARDIOGRAM (TEE);  Surgeon: [name], MD;  Location: [facility] ENDOSCOPY;  Service: Cardiovascular;  Laterality: N/A;    There were no vitals filed for this visit.  Subjective Assessment - 04/19/18 1403    Subjective  Pt's wife reports that things are going ok. Pt had to go to the ED over the weekend because of some urinary issues.     Currently in Pain?  No/denies         [facility] PT Assessment - 04/19/18 0001      Assessment    Medical Diagnosis  Frequent falls     Referring Provider (PT)  Carolann Littler, MD     Onset Date/Surgical Date  --   chronic issue   Prior Therapy  OPPT for balance      Precautions   Precautions  Fall      Balance Screen   Has the patient fallen in the past 6 months  Yes    How many times?  1-2    Has the patient had a decrease in activity level because of a fear of falling?   No    Is the patient reluctant to leave their home because of a fear of falling?   No      Home Film/video editor residence      Prior Function   Level of Independence  Independent with basic ADLs      Cognition   Overall Cognitive Status  History of cognitive impairments - at baseline      Strength   Right Hip Flexion  5/5    Right Hip ABduction  3/5    Left Hip Flexion  5/5    Left Hip ABduction  5/5    Right Knee Flexion  4/5    Right Knee Extension  5/5    Left Knee Flexion  4/5    Left Knee Extension  5/5    Right Ankle Dorsiflexion  5/5    Left Ankle Dorsiflexion  5/5      Flexibility   Hamstrings  90/90 testing: Rt 40 deg, Lt 45 deg lacking       Transfers   Five time sit to stand comments   12 sec, crashing into chair       Berg Balance Test   Sit to Stand  Able to stand without using hands and stabilize independently    Standing Unsupported  Able to stand safely 2 minutes    Sitting with Back Unsupported but Feet Supported on Floor or Stool  Able to sit safely and securely 2 minutes    Stand to Sit  Sits safely with minimal use of hands    Transfers  Able to transfer safely, minor use of hands    Standing Unsupported with Eyes Closed  Able to stand 10 seconds safely    Standing Ubsupported with Feet Together  Able to place feet together independently and stand 1 minute safely    From Standing, Reach Forward with Outstretched Arm  Can reach forward >12 cm safely (5")    From Standing Position, Pick up Object from Floor  Able to pick up shoe safely and easily     From Standing Position, Turn to Look Behind Over each Shoulder  Looks behind from both sides and weight shifts well    Turn 360 Degrees  Needs close supervision or verbal cueing    Standing Unsupported, Alternately Place Feet on Step/Stool  Able to stand independently and safely and complete 8 steps in 20 seconds    Standing Unsupported, One Foot in Front  Able to plae foot ahead of the other independently and hold 30 seconds    Standing on One Leg  Tries to lift leg/unable to hold 3 seconds but remains standing independently    Total Score  48      Dynamic Gait Index   Level Surface  Mild Impairment    Change in Gait Speed  Moderate Impairment    Gait with Horizontal Head Turns  Moderate Impairment    Gait with Vertical Head Turns  Moderate Impairment    Gait and Pivot Turn  Normal    Step Over Obstacle  Moderate Impairment    Step Around Obstacles  Moderate Impairment    Steps  Mild Impairment    Total Score  12      Timed Up and Go Test   TUG Comments  12 sec x2 trials.                            PT Education - 04/19/18 1633    Education Details  reassessment findings; importance of HEP adherence    Person(s) Educated  Patient    Methods  Explanation;Verbal cues    Comprehension  Verbalized understanding;Returned demonstration       PT Short Term Goals - 04/19/18 1426      PT SHORT TERM GOAL #1   Title  Pt will be independent with his initial HEP to improve LE strength and balance.    Time  4    Period  Weeks    Status  Achieved      PT SHORT TERM GOAL #2   Title  Pt will  demo improved gastroc strength, evident by his ability to complete atleast 5 single leg heel raises on each LE.    Baseline  3 on Lt, 15 on Rt     Time  4    Period  Weeks    Status  Achieved        PT Long Term Goals - 04/19/18 1424      PT LONG TERM GOAL #1   Title  Pt will be independent with advanced HEP to decrease risk of deconditioning and future falls after  discharge from PT.     Baseline  pt plans to transition to regular program with personal trainer    Time  6    Period  Weeks    Status  Partially Met      PT LONG TERM GOAL #2   Title  Pt will complete 5x sit to stand in less than 14 sec without UE support, to reflect improvements in functional strength and power.     Baseline  12 sec    Time  6    Period  Weeks    Status  Achieved      PT LONG TERM GOAL #3   Title  Pt will be able to complete the TUG in less than 13 sec, with LRAD, to reflect improvements in his balance and stability with ambulation in the community.     Baseline  12 sec, LRAD    Time  6    Period  Weeks    Status  Achieved      PT LONG TERM GOAL #4   Title  Pt will demonstrate DGI of >19/24 to demonstrate decreased risk of falls    Baseline  12/24 (03/07/18); 12/24 (04/19/18)    Time  6    Period  Weeks    Status  Not Met      PT LONG TERM GOAL #5   Title  Improved Berg score to > or = to 50/56 for reduced risk of falls    Baseline  48/56 (04/19/18)    Time  6    Period  Weeks    Status  Partially Met            Plan - 04/19/18 1509    Clinical Impression Statement  Pt was discharged this visit having met his short term goals and several of his long term goals since beginning PT. His LE strength increased to 5/5 MMT in some areas, with Rt hip abduction remaining limited at 3/5 MMT. Pt's 5x sit to stand and TUG are both within normal limits for someone his age and his wife has noticed improvements in his gait speed at home. Unfortunately, the pt's balance remains an area of concern, with little to no progress on his most recent BERG and DGI score despite therapist efforts and addressing this during his sessions. Pt has been moderately adherent with his HEP, which could be attributed to his plateau in progress recently. He has plans to transition to a gym routine with a trainer which is recommended in order to prevent his regression following d/c from PT. Pt  and his spouse were both agreeable with d/c and plan to continue with HEP and strengthening program moving forward.    PT Treatment/Interventions  ADLs/Self Care Home Management;Moist Heat;Cryotherapy;Therapeutic activities;Functional mobility training;Stair training;Gait training;Therapeutic exercise;Neuromuscular re-education;Balance training;Patient/family education;Manual techniques;Passive range of motion    PT Next Visit Plan  KX modifier needeed; Hip and quad strength progression, ankle strength and  flexiblility, dynamic balance with dual task activities for improved coordination; progress HEP as able    PT Home Exercise Plan  Access Code: [email or web address]     Consulted and Agree with Plan of Care  Patient       Patient will benefit from skilled therapeutic intervention in order to improve the following deficits and impairments:  Abnormal gait, Decreased activity tolerance, Decreased safety awareness, Decreased strength, Impaired flexibility, Postural dysfunction, Pain, Improper body mechanics, Decreased range of motion, Decreased endurance, Decreased balance, Decreased mobility, Difficulty walking, Hypomobility  Visit Diagnosis: Unsteadiness on feet  History of falling  Muscle weakness (generalized)  Difficulty in walking, not elsewhere classified     Problem List Patient Active Problem List   Diagnosis Date Noted  . Dupuytren's contracture 10/30/2017  . Frequent falls 10/30/2017  . History of closed head injury 07/25/2017  . Osteoarthritis, multiple sites 07/08/2016  . Risk for falls 07/08/2016  . Premature atrial contractions 05/11/2015  . Sepsis ([place]) 04/29/2015  . History of small bowel obstruction 04/29/2015  . History of traumatic brain injury 04/29/2015  . Acute respiratory failure ([place]) 04/29/2015  . CAP (community acquired pneumonia)   . Unspecified cerebral artery occlusion with cerebral infarction 08/25/2012  . Aphasia 08/25/2012  . Macular degeneration 04/25/2012  .  CVA (cerebral infarction) 03/20/2012  . Small bowel obstruction ([place]) 09/12/2011  . Nausea & vomiting 09/12/2011  . Memory loss 04/01/2010  . 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 SKIN LESION 10/24/2009  . NEOPLASM, SKIN, UNCERTAIN BEHAVIOR 35/45/6256  . DYSGEUSIA 10/10/2009  . Hyperlipidemia 07/20/2008  . DEPRESSION 07/20/2008  . Essential hypertension 07/20/2008  . RHINITIS 07/20/2008  . COLONIC POLYPS, HX OF 07/20/2008  . GERD 07/18/2008  . PROSTATE CANCER, HX OF 07/18/2008   PHYSICAL THERAPY DISCHARGE SUMMARY  Visits from Start of Care: 36  Current functional level related to goals / functional outcomes: See above for more details    Remaining deficits: See above for more details    Education / Equipment: See above for more details   Plan: Patient agrees to discharge.  Patient goals were partially met. Patient is being discharged due to lack of progress.  ?????         4:39 PM,04/19/18 Sherol Dade PT, [facility]   [facility] [address] [place], [place], [address] Phone: [phone]   Fax:  [phone]  Name: DONAT HUMBLE MRN: [id number] Date of Birth: [birth year]

## 2018-04-19 NOTE — Progress Notes (Signed)
Subjective:     Patient ID: Eric George, male   DOB: 18-Sep-1930, 84 y.o.   MRN: [id number]  HPI Is seen for hospital follow-up.  He had a recent persistent cough and we obtained chest x-ray and there was question of basilar infiltrate.  Radiologist recommended treatment with antibiotic followed by repeat chest x-ray in 3 to 4 weeks.  We started Levaquin 500 mg daily.  This past weekend patient had mentioned to his wife around 39 PM on Saturday night that he had some urine "dribbling.  He has had prior history of TURP and radical prostatectomy.  This was back in the 90s.  By Sunday morning he was complained of some mild burning with urination and still having low urine output.  Ultrasound revealed no significant urine in bladder and Foley catheter was placed with no significant urine initially until patient received IV fluids  He did not report any fever or chills.  No recent anticholinergic medications.  Went to ER and evaluation there significant for urinalysis with moderate leukocytes.  Urine culture came back negative.  Chemistries were unremarkable.  His creatinine was normal and electrolytes were normal.  Patient was given some IV fluids and had good urine output and was discharged home.  He has not had any urinary symptoms or problems since then.  He is taking last dose of Levaquin today.  Cough is better.  No fever.  Past Medical History:  Diagnosis Date  . Bowel obstruction ([place])   . CAP (community acquired pneumonia)   . 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 GERD (gastroesophageal reflux disease)   . Hydrocephalus ([place])   . Hyperlipidemia   . Hypertension   . Osteoarthritis, multiple sites 07/08/2016  . 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 Stroke ([place])   . TBI (traumatic brain injury) [facility])    Past Surgical History:  Procedure Laterality Date  . BRAIN SURGERY    . HERNIA REPAIR    . PROSTATECTOMY    . TEE WITHOUT CARDIOVERSION   03/22/2012   Procedure: TRANSESOPHAGEAL ECHOCARDIOGRAM (TEE);  Surgeon: [name], MD;  Location: [facility] ENDOSCOPY;  Service: Cardiovascular;  Laterality: N/A;    reports that he quit smoking about 51 years ago. His smoking use included cigarettes. He has a 7.50 pack-year smoking history. He has never used smokeless tobacco. He reports that he does not drink alcohol or use drugs. family history includes Atrial fibrillation in his sister; Heart disease in his sister; Uterine cancer in his sister. Allergies  Allergen Reactions  . Penicillins Rash    Has patient had a PCN reaction causing immediate rash, facial/tongue/throat swelling, SOB or lightheadedness with hypotension: Yes Has patient had a PCN reaction causing severe rash involving mucus membranes or skin necrosis: No Has patient had a PCN reaction that required hospitalization: No Has patient had a PCN reaction occurring within the last 10 years: No If all of the above answers are "NO", then may proceed with Cephalosporin use.     Review of Systems  Constitutional: Negative for appetite change, chills, fever and unexpected weight change.  Respiratory: Negative for cough.   Cardiovascular: Negative for chest pain.  Gastrointestinal: Negative for abdominal pain.  Genitourinary: Negative for decreased urine volume, difficulty urinating, dysuria, frequency, hematuria and urgency.       Objective:   Physical Exam Constitutional:      Appearance: Normal appearance.  Cardiovascular:  Rate and Rhythm: Normal rate and regular rhythm.     Pulses: Normal pulses.  Pulmonary:     Effort: Pulmonary effort is normal.     Breath sounds: Normal breath sounds.  Musculoskeletal:     Right lower leg: No edema.     Left lower leg: No edema.  Neurological:     General: No focal deficit present.     Mental Status: He is alert.        Assessment:     #1 recent persistent cough with chest x-ray showing question of basilar  infiltrate.  Patient currently on Levaquin and finishing that course  #2 recent decreased urinary output.  No evidence for obstruction.  Patient has had previous radical prostatectomy.  No evidence for neurogenic bladder. ?  Mild dehydration though creatinine was normal.  No evidence for UTI    Plan:     -Finish out Levaquin. -Continue good hydration -Follow-up and in about 3 to 4 weeks for repeat chest x-ray to make sure infiltrate resolving -Follow-up sooner for any recurrent urinary difficulties -We did not see any need for urologic referral at this point since he has really no reason for any kind of neurogenic bladder issues or obstructive issues and is urinating without difficulty at this time  Eulas Post MD [facility]

## 2018-04-21 ENCOUNTER — Ambulatory Visit: Payer: Medicare Other | Admitting: Physical Therapy

## 2018-04-25 ENCOUNTER — Other Ambulatory Visit: Payer: Self-pay

## 2018-04-25 ENCOUNTER — Emergency Department (HOSPITAL_COMMUNITY): Payer: Medicare Other

## 2018-04-25 ENCOUNTER — Inpatient Hospital Stay (HOSPITAL_COMMUNITY)
Admission: EM | Admit: 2018-04-25 | Discharge: 2018-04-28 | DRG: 871 | Disposition: A | Discharge status: Rehabilitation Facility | Payer: Medicare Other | Attending: Internal Medicine | Admitting: Internal Medicine

## 2018-04-25 ENCOUNTER — Encounter (HOSPITAL_COMMUNITY): Payer: Self-pay

## 2018-04-25 DIAGNOSIS — S0990XA Unspecified injury of head, initial encounter: Secondary | ICD-10-CM | POA: Diagnosis not present

## 2018-04-25 DIAGNOSIS — J189 Pneumonia, unspecified organism: Secondary | ICD-10-CM | POA: Diagnosis not present

## 2018-04-25 DIAGNOSIS — I69320 Aphasia following cerebral infarction: Secondary | ICD-10-CM

## 2018-04-25 DIAGNOSIS — R4182 Altered mental status, unspecified: Secondary | ICD-10-CM

## 2018-04-25 DIAGNOSIS — N401 Enlarged prostate with lower urinary tract symptoms: Secondary | ICD-10-CM | POA: Diagnosis present

## 2018-04-25 DIAGNOSIS — G92 Toxic encephalopathy: Secondary | ICD-10-CM | POA: Diagnosis present

## 2018-04-25 DIAGNOSIS — A419 Sepsis, unspecified organism: Secondary | ICD-10-CM | POA: Diagnosis not present

## 2018-04-25 DIAGNOSIS — R509 Fever, unspecified: Secondary | ICD-10-CM

## 2018-04-25 DIAGNOSIS — S0081XA Abrasion of other part of head, initial encounter: Secondary | ICD-10-CM | POA: Diagnosis not present

## 2018-04-25 DIAGNOSIS — M79641 Pain in right hand: Secondary | ICD-10-CM | POA: Diagnosis not present

## 2018-04-25 DIAGNOSIS — I10 Essential (primary) hypertension: Secondary | ICD-10-CM | POA: Diagnosis present

## 2018-04-25 DIAGNOSIS — S6991XA Unspecified injury of right wrist, hand and finger(s), initial encounter: Secondary | ICD-10-CM | POA: Diagnosis not present

## 2018-04-25 DIAGNOSIS — E785 Hyperlipidemia, unspecified: Secondary | ICD-10-CM | POA: Diagnosis present

## 2018-04-25 DIAGNOSIS — W1830XA Fall on same level, unspecified, initial encounter: Secondary | ICD-10-CM | POA: Diagnosis present

## 2018-04-25 DIAGNOSIS — Z85828 Personal history of other malignant neoplasm of skin: Secondary | ICD-10-CM

## 2018-04-25 DIAGNOSIS — Z8546 Personal history of malignant neoplasm of prostate: Secondary | ICD-10-CM

## 2018-04-25 DIAGNOSIS — Z79899 Other long term (current) drug therapy: Secondary | ICD-10-CM

## 2018-04-25 DIAGNOSIS — Z87820 Personal history of traumatic brain injury: Secondary | ICD-10-CM

## 2018-04-25 DIAGNOSIS — G9340 Encephalopathy, unspecified: Secondary | ICD-10-CM | POA: Diagnosis present

## 2018-04-25 DIAGNOSIS — S299XXA Unspecified injury of thorax, initial encounter: Secondary | ICD-10-CM | POA: Diagnosis not present

## 2018-04-25 DIAGNOSIS — M542 Cervicalgia: Secondary | ICD-10-CM | POA: Diagnosis not present

## 2018-04-25 DIAGNOSIS — S3991XA Unspecified injury of abdomen, initial encounter: Secondary | ICD-10-CM | POA: Diagnosis not present

## 2018-04-25 DIAGNOSIS — S00511A Abrasion of lip, initial encounter: Secondary | ICD-10-CM | POA: Diagnosis not present

## 2018-04-25 DIAGNOSIS — Z880 Allergy status to penicillin: Secondary | ICD-10-CM

## 2018-04-25 DIAGNOSIS — K219 Gastro-esophageal reflux disease without esophagitis: Secondary | ICD-10-CM | POA: Diagnosis present

## 2018-04-25 DIAGNOSIS — R339 Retention of urine, unspecified: Secondary | ICD-10-CM | POA: Diagnosis present

## 2018-04-25 DIAGNOSIS — K5909 Other constipation: Secondary | ICD-10-CM | POA: Diagnosis present

## 2018-04-25 DIAGNOSIS — R451 Restlessness and agitation: Secondary | ICD-10-CM | POA: Diagnosis present

## 2018-04-25 DIAGNOSIS — S0011XA Contusion of right eyelid and periocular area, initial encounter: Secondary | ICD-10-CM | POA: Diagnosis not present

## 2018-04-25 DIAGNOSIS — T07XXXA Unspecified multiple injuries, initial encounter: Secondary | ICD-10-CM

## 2018-04-25 DIAGNOSIS — Z87891 Personal history of nicotine dependence: Secondary | ICD-10-CM

## 2018-04-25 DIAGNOSIS — S0031XA Abrasion of nose, initial encounter: Secondary | ICD-10-CM | POA: Diagnosis present

## 2018-04-25 DIAGNOSIS — S199XXA Unspecified injury of neck, initial encounter: Secondary | ICD-10-CM | POA: Diagnosis not present

## 2018-04-25 DIAGNOSIS — Z7982 Long term (current) use of aspirin: Secondary | ICD-10-CM

## 2018-04-25 DIAGNOSIS — S0093XA Contusion of unspecified part of head, initial encounter: Secondary | ICD-10-CM | POA: Diagnosis not present

## 2018-04-25 DIAGNOSIS — Z23 Encounter for immunization: Secondary | ICD-10-CM

## 2018-04-25 HISTORY — DX: Malignant neoplasm of prostate: C61

## 2018-04-25 HISTORY — DX: Other chronic pain: G89.29

## 2018-04-25 HISTORY — DX: Unspecified malignant neoplasm of skin, unspecified: C44.90

## 2018-04-25 HISTORY — DX: Cervicalgia: M54.2

## 2018-04-25 HISTORY — DX: Pneumonitis due to inhalation of food and vomit: J69.0

## 2018-04-25 HISTORY — DX: Unspecified osteoarthritis, unspecified site: M19.90

## 2018-04-25 HISTORY — DX: Unspecified intestinal obstruction, unspecified as to partial versus complete obstruction: K56.609

## 2018-04-25 LAB — COMPREHENSIVE METABOLIC PANEL
ALT: 13 U/L (ref 0–44)
AST: 20 U/L (ref 15–41)
Albumin: 3.7 g/dL (ref 3.5–5.0)
Alkaline Phosphatase: 74 U/L (ref 38–126)
Anion gap: 5 (ref 5–15)
BUN: 20 mg/dL (ref 8–23)
CO2: 20 mmol/L — ABNORMAL LOW (ref 22–32)
Calcium: 9.6 mg/dL (ref 8.9–10.3)
Chloride: 111 mmol/L (ref 98–111)
Creatinine, Ser: 0.96 mg/dL (ref 0.61–1.24)
GFR calc Af Amer: >60 mL/min (ref >60)
GFR calc non Af Amer: >60 mL/min (ref >60)
Glucose, Bld: 108 mg/dL — ABNORMAL HIGH (ref 70–99)
Potassium: 4.4 mmol/L (ref 3.5–5.1)
Sodium: 136 mmol/L (ref 135–145)
Total Bilirubin: 0.6 mg/dL (ref 0.3–1.2)
Total Protein: 6.1 g/dL — ABNORMAL LOW (ref 6.5–8.1)

## 2018-04-25 LAB — URINALYSIS, ROUTINE W REFLEX MICROSCOPIC
Bilirubin Urine: NEGATIVE
Glucose, UA: NEGATIVE mg/dL
Hgb urine dipstick: NEGATIVE
Ketones, ur: NEGATIVE mg/dL
Leukocytes, UA: NEGATIVE
Nitrite: NEGATIVE
Protein, ur: NEGATIVE mg/dL
Specific Gravity, Urine: 1.014 (ref 1.005–1.030)
pH: 6 (ref 5.0–8.0)

## 2018-04-25 LAB — I-STAT CHEM 8, ED
BUN: 22 mg/dL (ref 8–23)
Calcium, Ion: 1.15 mmol/L (ref 1.15–1.40)
Chloride: 110 mmol/L (ref 98–111)
Creatinine, Ser: 1 mg/dL (ref 0.61–1.24)
Glucose, Bld: 108 mg/dL — ABNORMAL HIGH (ref 70–99)
HCT: 43 % (ref 39.0–52.0)
Hemoglobin: 14.6 g/dL (ref 13.0–17.0)
Potassium: 4.1 mmol/L (ref 3.5–5.1)
Sodium: 138 mmol/L (ref 135–145)
TCO2: 21 mmol/L — ABNORMAL LOW (ref 22–32)

## 2018-04-25 LAB — DIFFERENTIAL
Abs Immature Granulocytes: 0.08 10*3/uL — ABNORMAL HIGH (ref 0.00–0.07)
Basophils Absolute: 0 10*3/uL (ref 0.0–0.1)
Basophils Relative: 0 %
Eosinophils Absolute: 0.6 10*3/uL — ABNORMAL HIGH (ref 0.0–0.5)
Eosinophils Relative: 6 %
Immature Granulocytes: 1 %
Lymphocytes Relative: 16 %
Lymphs Abs: 1.6 10*3/uL (ref 0.7–4.0)
Monocytes Absolute: 0.9 10*3/uL (ref 0.1–1.0)
Monocytes Relative: 9 %
Neutro Abs: 7.1 10*3/uL (ref 1.7–7.7)
Neutrophils Relative %: 68 %

## 2018-04-25 LAB — CBC
HCT: 46 % (ref 39.0–52.0)
Hemoglobin: 14.7 g/dL (ref 13.0–17.0)
MCH: 28.8 pg (ref 26.0–34.0)
MCHC: 32 g/dL (ref 30.0–36.0)
MCV: 90.2 fL (ref 80.0–100.0)
Platelets: 213 10*3/uL (ref 150–400)
RBC: 5.1 MIL/uL (ref 4.22–5.81)
RDW: 13 % (ref 11.5–15.5)
WBC: 10.3 10*3/uL (ref 4.0–10.5)
nRBC: 0 % (ref 0.0–0.2)

## 2018-04-25 LAB — PROTIME-INR
INR: 1.04
Prothrombin Time: 13.5 seconds (ref 11.4–15.2)

## 2018-04-25 LAB — CBG MONITORING, ED: Glucose-Capillary: 92 mg/dL (ref 70–99)

## 2018-04-25 LAB — I-STAT TROPONIN, ED: Troponin i, poc: 0.01 ng/mL (ref 0.00–0.08)

## 2018-04-25 LAB — APTT: aPTT: 31 seconds (ref 24–36)

## 2018-04-25 MED ORDER — IOHEXOL 300 MG/ML  SOLN
100.0000 mL | Freq: Once | INTRAMUSCULAR | Status: AC | PRN
  Administered 2018-04-25: 75 mL via INTRAVENOUS

## 2018-04-25 MED ORDER — LORAZEPAM 2 MG/ML IJ SOLN: 1.0000 mg | Freq: Once | INTRAMUSCULAR | Status: DC

## 2018-04-25 MED ORDER — LORAZEPAM 2 MG/ML IJ SOLN
0.5000 mg | Freq: Once | INTRAMUSCULAR | Status: AC
  Administered 2018-04-26: 0.5 mg via INTRAVENOUS
  Filled 2018-04-25: qty 1

## 2018-04-25 MED ORDER — TETANUS-DIPHTH-ACELL PERTUSSIS 5-2.5-18.5 LF-MCG/0.5 IM SUSP
0.5000 mL | Freq: Once | INTRAMUSCULAR | Status: AC
  Administered 2018-04-25: 0.5 mL via INTRAMUSCULAR
  Filled 2018-04-25: qty 0.5

## 2018-04-25 MED ORDER — LORAZEPAM 2 MG/ML IJ SOLN
0.5000 mg | Freq: Once | INTRAMUSCULAR | Status: AC
  Administered 2018-04-25: 0.5 mg via INTRAVENOUS

## 2018-04-25 NOTE — ED Notes (Signed)
Patient returned from CT

## 2018-04-25 NOTE — ED Notes (Signed)
Patient transported to CT, 

## 2018-04-25 NOTE — ED Notes (Signed)
Pt CBG 92. RN notified.

## 2018-04-25 NOTE — ED Notes (Signed)
Pt is confused at his baseline, however wife reports pt is slightly more confused than normal.

## 2018-04-25 NOTE — ED Provider Notes (Addendum)
[facility] EMERGENCY DEPARTMENT Provider Note   CSN: [id number] Arrival date & time: 04/25/18  1716     History   Chief Complaint Chief Complaint  Patient presents with  . Dizziness    HPI Eric George is a 84 y.o. male.  The history is provided by the patient, medical records and the spouse. The history is limited by the condition of the patient (Chronic a aphasia from prior stroke). No language interpreter was used.  Fall  This is a new problem. The current episode started 1 to 2 hours ago. The problem occurs constantly. The problem has been resolved. Associated symptoms include chest pain, abdominal pain and headaches. Pertinent negatives include no shortness of breath. Nothing relieves the symptoms. He has tried nothing for the symptoms. The treatment provided no relief.    Past Medical History:  Diagnosis Date  . Bowel obstruction ([place])   . CAP (community acquired pneumonia)   . 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 GERD (gastroesophageal reflux disease)   . Hydrocephalus ([place])   . Hyperlipidemia   . Hypertension   . Osteoarthritis, multiple sites 07/08/2016  . 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 Stroke ([place])   . TBI (traumatic brain injury) [facility])     Patient Active Problem List   Diagnosis Date Noted  . Dupuytren's contracture 10/30/2017  . Frequent falls 10/30/2017  . History of closed head injury 07/25/2017  . Osteoarthritis, multiple sites 07/08/2016  . Risk for falls 07/08/2016  . Premature atrial contractions 05/11/2015  . Sepsis ([place]) 04/29/2015  . History of small bowel obstruction 04/29/2015  . History of traumatic brain injury 04/29/2015  . Acute respiratory failure ([place]) 04/29/2015  . CAP (community acquired pneumonia)   . Unspecified cerebral artery occlusion with cerebral infarction 08/25/2012  . Aphasia 08/25/2012  . Macular degeneration 04/25/2012  . CVA (cerebral  infarction) 03/20/2012  . Small bowel obstruction ([place]) 09/12/2011  . Nausea & vomiting 09/12/2011  . Memory loss 04/01/2010  . 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 SKIN LESION 10/24/2009  . NEOPLASM, SKIN, UNCERTAIN BEHAVIOR 62/13/0865  . DYSGEUSIA 10/10/2009  . Hyperlipidemia 07/20/2008  . DEPRESSION 07/20/2008  . Essential hypertension 07/20/2008  . RHINITIS 07/20/2008  . COLONIC POLYPS, HX OF 07/20/2008  . GERD 07/18/2008  . PROSTATE CANCER, HX OF 07/18/2008    Past Surgical History:  Procedure Laterality Date  . BRAIN SURGERY    . HERNIA REPAIR    . PROSTATECTOMY    . TEE WITHOUT CARDIOVERSION  03/22/2012   Procedure: TRANSESOPHAGEAL ECHOCARDIOGRAM (TEE);  Surgeon: [name], MD;  Location: [facility] ENDOSCOPY;  Service: Cardiovascular;  Laterality: N/A;        Home Medications    Prior to Admission medications   Medication Sig Start Date End Date Taking? Authorizing Provider  acetaminophen (TYLENOL) 325 MG tablet Take 325-650 mg by mouth every 6 (six) hours as needed for mild pain.     [provider]  acetic acid-hydrocortisone (VOSOL-HC) otic solution Place 5 drops into both ears every 30 (thirty) days.  02/06/14   [provider]  aspirin 325 MG tablet Take 1 tablet (325 mg total) by mouth daily. 03/23/12   Eugenie Filler, MD  atorvastatin (LIPITOR) 40 MG tablet TAKE 1 TABLET DAILY AT 6PM Patient taking differently: Take 40 mg by mouth daily at 6 PM.  01/24/18   Burchette, Alinda Sierras, MD  carvedilol (COREG) 3.125 MG tablet TAKE 1 TABLET TWICE A DAY  WITH MEALS Patient taking differently: Take 3.125 mg by mouth 2 (two) times daily with a meal.  02/21/18   Burchette, Alinda Sierras, MD  irbesartan (AVAPRO) 150 MG tablet Take 1 tablet (150 mg total) by mouth daily. Patient taking differently: Take 150 mg by mouth every evening.  01/06/18   Burchette, Alinda Sierras, MD  levofloxacin (LEVAQUIN) 500 MG tablet Take 1 tablet (500 mg total) by mouth daily.  04/15/18   Burchette, Alinda Sierras, MD  Multiple Vitamins-Minerals (MACULAR VITAMIN BENEFIT) TABS Take 1 capsule by mouth daily.    [provider]  omeprazole (PRILOSEC) 40 MG capsule Take 40 mg by mouth daily.    [provider]  Probiotic Product (ALIGN) 4 MG CAPS Take 1 capsule by mouth daily.    [provider]  sertraline (ZOLOFT) 100 MG tablet TAKE 1 TABLET DAILY.       (PLEASE SCHEDULE A PHYSICALFOR MORE REFILLS. THANKS.) Patient taking differently: Take 100 mg by mouth daily.  02/21/18   Burchette, Alinda Sierras, MD    Family History Family History  Problem Relation Age of Onset  . Heart disease Sister   . Atrial fibrillation Sister   . Uterine cancer Sister     Social History Social History   Tobacco Use  . Smoking status: Former Smoker    Packs/day: 0.50    Years: 15.00    Pack years: 7.50    Types: Cigarettes    Last attempt to quit: 09/30/1966    Years since quitting: 51.6  . Smokeless tobacco: Never Used  Substance Use Topics  . Alcohol use: No    Alcohol/week: 0.0 standard drinks  . Drug use: No     Allergies   Penicillins   Review of Systems Review of Systems  Constitutional: Negative for chills, diaphoresis, fatigue and fever.  HENT: Negative for congestion.   Eyes: Negative for visual disturbance.  Respiratory: Positive for cough. Negative for chest tightness, shortness of breath, wheezing and stridor.   Cardiovascular: Positive for chest pain. Negative for palpitations and leg swelling.  Gastrointestinal: Positive for abdominal pain. Negative for constipation, diarrhea, nausea and vomiting.  Genitourinary: Negative for dysuria and flank pain.  Musculoskeletal: Negative for back pain, neck pain and neck stiffness.  Skin: Positive for wound. Negative for rash.  Neurological: Positive for dizziness and headaches. Negative for syncope and light-headedness.  Psychiatric/Behavioral: Positive for confusion. Negative for agitation.  All  other systems reviewed and are negative.    Physical Exam Updated Vital Signs BP (!) 154/88   Pulse 78   Temp 98.2 F (36.8 C) (Oral)   Resp 19   SpO2 100%   Physical Exam Vitals signs and nursing note reviewed.  Constitutional:      Appearance: He is well-developed.  HENT:     Head: Normocephalic. Abrasion and contusion present. No laceration. Hair is normal.     Jaw: No tenderness, swelling or pain on movement.      Nose: No congestion or rhinorrhea.     Mouth/Throat:     Pharynx: No oropharyngeal exudate or posterior oropharyngeal erythema.  Eyes:     Extraocular Movements: Extraocular movements intact.     Conjunctiva/sclera: Conjunctivae normal.     Pupils: Pupils are equal, round, and reactive to light.  Neck:     Musculoskeletal: Neck supple. No neck rigidity or muscular tenderness.  Cardiovascular:     Rate and Rhythm: Normal rate and  regular rhythm.     Heart sounds: Murmur present.  Pulmonary:     Effort: Pulmonary effort is normal. No respiratory distress.     Breath sounds: Normal breath sounds. No wheezing, rhonchi or rales.  Chest:     Chest wall: Tenderness present. No lacerations, swelling or edema.    Abdominal:     Palpations: Abdomen is soft.     Tenderness: There is abdominal tenderness in the left upper quadrant. There is no right CVA tenderness, left CVA tenderness, guarding or rebound.    Musculoskeletal:        General: Tenderness present.     Right hand: He exhibits tenderness and swelling. He exhibits normal capillary refill and no laceration. Normal sensation noted. Normal strength noted.       Hands:  Skin:    General: Skin is warm and dry.     Capillary Refill: Capillary refill takes less than 2 seconds.  Neurological:     General: No focal deficit present.     Mental Status: He is alert. He is disoriented.     Cranial Nerves: No cranial nerve deficit.     Sensory: No sensory deficit.     Motor: No weakness.  Psychiatric:         Mood and Affect: Mood normal.      ED Treatments / Results  Labs (all labs ordered are listed, but only abnormal results are displayed) Labs Reviewed  DIFFERENTIAL - Abnormal; Notable for the following components:      Result Value   Eosinophils Absolute 0.6 (*)    Abs Immature Granulocytes 0.08 (*)    All other components within normal limits  COMPREHENSIVE METABOLIC PANEL - Abnormal; Notable for the following components:   CO2 20 (*)    Glucose, Bld 108 (*)    Total Protein 6.1 (*)    All other components within normal limits  URINALYSIS, ROUTINE W REFLEX MICROSCOPIC - Abnormal; Notable for the following components:   Color, Urine STRAW (*)    All other components within normal limits  I-STAT CHEM 8, ED - Abnormal; Notable for the following components:   Glucose, Bld 108 (*)    TCO2 21 (*)    All other components within normal limits  URINE CULTURE  PROTIME-INR  APTT  CBC  LIPASE, BLOOD  I-STAT TROPONIN, ED  CBG MONITORING, ED  I-STAT CG4 LACTIC ACID, ED  I-STAT CG4 LACTIC ACID, ED    EKG EKG Interpretation  Date/Time:  Monday April 25 2018 17:25:15 EST Ventricular Rate:  99 PR Interval:  224 QRS Duration: 94 QT Interval:  350 QTC Calculation: 449 R Axis:   77 Text Interpretation:  Sinus rhythm with 1st degree A-V block with occasional Premature ventricular complexes and Fusion complexes Otherwise normal ECG When comapred to prior, faster rate.  No STEMI Confirmed by Antony Blackbird [phone]) on 04/25/2018 7:03:11 PM   Radiology Ct Head Wo Contrast  Result Date: 04/25/2018 CLINICAL DATA:  Patient was on a walk-in fell hitting his head. History of prior stroke with speech deficits. EXAM: CT HEAD WITHOUT CONTRAST TECHNIQUE: Contiguous axial images were obtained from the base of the skull through the vertex without intravenous contrast. COMPARISON:  11/16/2017 FINDINGS: Brain: Right frontal and left temporoparietal lobe encephalomalacia from remote infarcts.  Chronic moderate small vessel ischemia. Superficial and central atrophy is redemonstrated without significant change. Midline fourth ventricle and basal cisterns without effacement. Brainstem and cerebellum appear nonacute. Vascular: Atherosclerosis at the skull base. No hyperdense  vessel sign. Skull: Right frontal craniotomy defect. No acute osseous abnormality. Sinuses/Orbits: Moderate ethmoid and mild sphenoid sinus mucosal thickening. Intact orbits and globes. Bilateral cataract surgery. Other: None. IMPRESSION: 1. No acute intracranial abnormality. 2. Right frontal and left temporoparietal lobe encephalomalacia from remote infarcts. 3. Chronic moderate small vessel ischemia. 4. Moderate ethmoid and mild sphenoid sinus mucosal thickening. Electronically Signed   By: Ashley Royalty M.D.   On: 04/25/2018 20:56   Ct Chest W Contrast  Result Date: 04/25/2018 CLINICAL DATA:  Dizziness, fall EXAM: CT CHEST, ABDOMEN, AND PELVIS WITH CONTRAST TECHNIQUE: Multidetector CT imaging of the chest, abdomen and pelvis was performed following the standard protocol during bolus administration of intravenous contrast. CONTRAST:  42mL OMNIPAQUE IOHEXOL 300 MG/ML  SOLN COMPARISON:  CT abdomen/pelvis dated 01/17/2017 FINDINGS: CT CHEST FINDINGS Cardiovascular: No evidence of traumatic aortic injury. No evidence of thoracic aortic aneurysm or dissection. Atherosclerotic calcifications of the aortic arch. The heart is normal in size.  No pericardial effusion. Mild coronary atherosclerosis of the LAD and right coronary artery. Mediastinum/Nodes: No evidence of mediastinal hematoma. No suspicious mediastinal lymphadenopathy. Visualized thyroid is grossly unremarkable. Lungs/Pleura: Biapical pleural-parenchymal scarring. Mild centrilobular and paraseptal emphysematous changes, upper lobe predominant. Mild subpleural reticulation/dependent atelectasis in the bilateral lower lobes. Evaluation of the lung parenchyma is constrained by  respiratory motion. Within that constraint, there are no suspicious pulmonary nodules. No focal consolidation. No pleural effusion or pneumothorax. Musculoskeletal: Degenerative changes of the thoracic spine. No fracture is seen. CT ABDOMEN PELVIS FINDINGS Hepatobiliary: 9 mm probable cyst in the left hepatic dome (series 3/image 46). 12 mm probable cyst inferiorly in the right hepatic lobe (series 3/image 62). Gallbladder is unremarkable. No intrahepatic or extrahepatic ductal dilatation. Pancreas: Within normal limits. Spleen: Within normal limits. Adrenals/Urinary Tract: Adrenal glands are within normal limits. 2.0 cm hyperdense/hemorrhagic cyst along the medial left upper kidney (series 3/image 87), unchanged. Bilateral renal cysts, simple. Cortical scarring along the posterior right lower kidney. 2 mm nonobstructing right lower pole renal calculus (series 3/image 39). No hydronephrosis. Bladder is within normal limits. Stomach/Bowel: Stomach is notable for a tiny hiatal hernia. No evidence of bowel obstruction. Normal appendix (series 3/image 108). Left colonic diverticulosis, without evidence of diverticulitis. Vascular/Lymphatic: No evidence of abdominal aortic aneurysm. Atherosclerotic calcifications of the abdominal aorta and branch vessels. No suspicious abdominopelvic lymphadenopathy. Status post pelvic lymph node dissection. Reproductive: Status post prostatectomy. Other: No abdominopelvic ascites. No hemoperitoneum or free air. Musculoskeletal: Mild degenerative changes of the lumbar spine. No fracture is seen. IMPRESSION: No evidence of traumatic injury to the chest, abdomen, or pelvis. Status post prostatectomy. No evidence of recurrent or metastatic disease. 2 cm hyperdense/hemorrhagic cyst along the medial left upper kidney, benign (Bosniak II). Additional stable ancillary findings as above. Electronically Signed   By: Julian Hy M.D.   On: 04/25/2018 20:58   Ct Cervical Spine Wo  Contrast  Result Date: 04/25/2018 CLINICAL DATA:  Neck pain after fall EXAM: CT CERVICAL SPINE WITHOUT CONTRAST TECHNIQUE: Multidetector CT imaging of the cervical spine was performed without intravenous contrast. Multiplanar CT image reconstructions were also generated. COMPARISON:  None. FINDINGS: Alignment: Maintained cervical lordosis. Skull base and vertebrae: Intact skull base. Subcortical degenerative cystic change of the odontoid process is noted. Partial ankylosis across the C2 through C5 interspaces. Soft tissues and spinal canal: No prevertebral fluid or swelling. No visible canal hematoma. Disc levels: Cervical spondylosis with moderate-to-marked disc flattening from C2 through C7 as well as moderate disc flattening C7-T1 and mild  from T1 through T4. Facet ankylosis is noted C2-C4 on the left. Bilateral facet arthrosis with facet joint space narrowing and hypertrophy is seen. Uncovertebral joint osteoarthritis is noted bilaterally from C3 through C7. Upper chest: Emphysematous changes at the apices both lungs. Aortic atherosclerosis of the included aortic arch is identified. 5 mm nodular density in the left upper lobe. Other: Extracranial carotid arteriosclerosis is seen bilaterally. IMPRESSION: 1. Cervical spondylosis without acute fracture or posttraumatic subluxation. 2. Emphysema. 3. 5 mm left upper lobe pulmonary nodule. No follow-up needed if patient is low-risk. Non-contrast chest CT can be considered in 12 months if patient is high-risk. This recommendation follows the consensus statement: Guidelines for Management of Incidental Pulmonary Nodules Detected on CT Images: From the Fleischner Society 2017; Radiology 2017; 284:228-243. Aortic Atherosclerosis (ICD10-I70.0) and Emphysema (ICD10-J43.9). Electronically Signed   By: Ashley Royalty M.D.   On: 04/25/2018 21:00   Ct Abdomen Pelvis W Contrast  Result Date: 04/25/2018 CLINICAL DATA:  Dizziness, fall EXAM: CT CHEST, ABDOMEN, AND PELVIS WITH  CONTRAST TECHNIQUE: Multidetector CT imaging of the chest, abdomen and pelvis was performed following the standard protocol during bolus administration of intravenous contrast. CONTRAST:  88mL OMNIPAQUE IOHEXOL 300 MG/ML  SOLN COMPARISON:  CT abdomen/pelvis dated 01/17/2017 FINDINGS: CT CHEST FINDINGS Cardiovascular: No evidence of traumatic aortic injury. No evidence of thoracic aortic aneurysm or dissection. Atherosclerotic calcifications of the aortic arch. The heart is normal in size.  No pericardial effusion. Mild coronary atherosclerosis of the LAD and right coronary artery. Mediastinum/Nodes: No evidence of mediastinal hematoma. No suspicious mediastinal lymphadenopathy. Visualized thyroid is grossly unremarkable. Lungs/Pleura: Biapical pleural-parenchymal scarring. Mild centrilobular and paraseptal emphysematous changes, upper lobe predominant. Mild subpleural reticulation/dependent atelectasis in the bilateral lower lobes. Evaluation of the lung parenchyma is constrained by respiratory motion. Within that constraint, there are no suspicious pulmonary nodules. No focal consolidation. No pleural effusion or pneumothorax. Musculoskeletal: Degenerative changes of the thoracic spine. No fracture is seen. CT ABDOMEN PELVIS FINDINGS Hepatobiliary: 9 mm probable cyst in the left hepatic dome (series 3/image 46). 12 mm probable cyst inferiorly in the right hepatic lobe (series 3/image 62). Gallbladder is unremarkable. No intrahepatic or extrahepatic ductal dilatation. Pancreas: Within normal limits. Spleen: Within normal limits. Adrenals/Urinary Tract: Adrenal glands are within normal limits. 2.0 cm hyperdense/hemorrhagic cyst along the medial left upper kidney (series 3/image 87), unchanged. Bilateral renal cysts, simple. Cortical scarring along the posterior right lower kidney. 2 mm nonobstructing right lower pole renal calculus (series 3/image 39). No hydronephrosis. Bladder is within normal limits.  Stomach/Bowel: Stomach is notable for a tiny hiatal hernia. No evidence of bowel obstruction. Normal appendix (series 3/image 108). Left colonic diverticulosis, without evidence of diverticulitis. Vascular/Lymphatic: No evidence of abdominal aortic aneurysm. Atherosclerotic calcifications of the abdominal aorta and branch vessels. No suspicious abdominopelvic lymphadenopathy. Status post pelvic lymph node dissection. Reproductive: Status post prostatectomy. Other: No abdominopelvic ascites. No hemoperitoneum or free air. Musculoskeletal: Mild degenerative changes of the lumbar spine. No fracture is seen. IMPRESSION: No evidence of traumatic injury to the chest, abdomen, or pelvis. Status post prostatectomy. No evidence of recurrent or metastatic disease. 2 cm hyperdense/hemorrhagic cyst along the medial left upper kidney, benign (Bosniak II). Additional stable ancillary findings as above. Electronically Signed   By: Julian Hy M.D.   On: 04/25/2018 20:58   Dg Hand Complete Right  Result Date: 04/25/2018 CLINICAL DATA:  Recent fall with right hand pain, initial encounter EXAM: RIGHT HAND - COMPLETE 3+ VIEW COMPARISON:  None. FINDINGS: Mild  degenerative changes are noted within the carpal bones. Some mild interphalangeal degenerative changes are noted as well. No acute fracture or dislocation is identified. IMPRESSION: Mild degenerative change without acute bony abnormality. Electronically Signed   By: Inez Catalina M.D.   On: 04/25/2018 20:45   Ct Maxillofacial Wo Contrast  Result Date: 04/25/2018 CLINICAL DATA:  Blunt trauma status post fall. EXAM: CT MAXILLOFACIAL WITHOUT CONTRAST TECHNIQUE: Multidetector CT imaging of the maxillofacial structures was performed. Multiplanar CT image reconstructions were also generated. COMPARISON:  11/16/2017 head CT FINDINGS: Osseous: No fracture or mandibular dislocation. Degenerative subcortical cystic change of the odontoid process. Slight asymmetry across the  Lantus dental likely degenerative in etiology. No acute fracture Orbits: Negative. No traumatic or inflammatory finding. Bilateral cataract extractions. Sinuses: Moderate ethmoid and bilateral maxillary and sphenoid sinus mucosal thickening is seen. Air-fluid levels Soft tissues: Right premalar soft tissue swelling and contusion. Limited intracranial: Right frontal and left temporoparietal encephalomalacia from remote infarcts. IMPRESSION: 1. No evidence of acute maxillofacial fracture. 2. Right premalar soft tissue swelling and contusion. 3. Moderate paranasal sinus disease. 4. Remote infarcts right frontal and left temporoparietal distribution. Electronically Signed   By: Ashley Royalty M.D.   On: 04/25/2018 21:07    Procedures Procedures (including critical care time)  CRITICAL CARE Performed by: Gwenyth Allegra Tegeler Total critical care time: 35 minutes Critical care time was exclusive of separately billable procedures and treating other patients. Critical care was necessary to treat or prevent imminent or life-threatening deterioration. Critical care was time spent personally by me on the following activities: development of treatment plan with patient and/or surrogate as well as nursing, discussions with consultants, evaluation of patient's response to treatment, examination of patient, obtaining history from patient or surrogate, ordering and performing treatments and interventions, ordering and review of laboratory studies, ordering and review of radiographic studies, pulse oximetry and re-evaluation of patient's condition.   Medications Ordered in ED Medications  LORazepam (ATIVAN) injection 0.5 mg ( Intravenous Canceled Entry 04/25/18 2327)  Tdap (BOOSTRIX) injection 0.5 mL (0.5 mLs Intramuscular Given 04/25/18 2152)  iohexol (OMNIPAQUE) 300 MG/ML solution 100 mL (75 mLs Intravenous Contrast Given 04/25/18 2018)  LORazepam (ATIVAN) injection 0.5 mg (0.5 mg Intravenous Given 04/25/18 2327)   haloperidol lactate (HALDOL) injection 5 mg (5 mg Intravenous Given 04/26/18 0010)     Initial Impression / Assessment and Plan / ED Course  I have reviewed the triage vital signs and the nursing notes.  Pertinent labs & imaging results that were available during my care of the patient were reviewed by me and considered in my medical decision making (see chart for details).     Eric George is a 84 y.o. male with a past medical history significant for prior TBI, prior stroke, hypertension, hyperlipidemia, GERD, prior small bowel obstruction, and recent URI status post 1 week of Levaquin who presents with worsening cough, and an onset of transient dizziness at 4:30 PM leading to a fall and injury to his right hand, face, head, and left torso.  Patient has residual stroke symptoms of aphasia and speech difficulty which his wife reports is similar to prior.  She says that he insists on walking alone and was on his walk today when he had sudden onset dizziness and then had a fall.  Wife reports that he thinks he drags his left leg for the last few months at times.  She says that she came and found him and the fire department had already been called.  Patient had no further dizziness or any lightheadedness but was having more confusion.  She reports that normally he is alert and oriented x2 to person and place and not to time.  She says that he is having difficulty remembering who his family is, his orientation to place, and she thinks he is acting more confused.  She is unsure if he had similar symptoms with prior stroke.  He denies any new numbness,, or weakness of extremities.  Patient had unchanged aphasia according to wife.  She says that his cough is slightly worsened over the last few days but he has not complained of fevers or chills.  He has not had nausea vomiting, urinary symptoms or any GI symptoms.    On exam, patient has normal strength and sensation in all extremities.  I would not able  to elicit weakness in the left leg which the wife was reporting she was concerned for.  Patient's lungs were clear and chest was nontender.  Abdomen was nontender.  Slight systolic murmur appreciated.  Patient has abrasions to his nose, lip, right hyperthenar eminence on his hand, and some redness on his left lower chest and left abdomen.  Patient has tenderness in his left lower chest and his left abdomen.  Patient had no loose teeth on exam and had no lacerations that appear to need stitches.  Patient's face was stable with no evidence of nasal septal hematoma.  Neck and back were nontender.  Patient had tenderness and swelling of his right hand hyperthenar eminence.  No sensation abnormality.  Patient has slight contracture of his right pinky which is unchanged from baseline.  He has no other wrist tenderness and has no snuffbox tenderness.  Symmetric grip strength.  Clinically I am concerned about traumatic injuries given the tenderness of the nose, tenderness on his hyperthenar eminence, and tenderness in the torso.  Patient will have CT of the head, face, chest/abdomen/pelvis.  He will have labs including lipase given the tenderness over this area.  He will have x-ray of the right hand.  Anticipate reassessment after his traumatic imaging is completed.  I am also concerned about TIA given the sudden onset and transient dizziness and his wife reported left leg dragging at times.  Anticipate speaking with neurology to discuss MRI or further work-up.  9:01 PM Patient unable to urinate, bladder scan was performed showing approximately 1 L.  Foley cath will be placed for the retention.  10:37 PM Urinalysis surprisingly shows no evidence of infection.  No nitrites leukocytes are present.  Patient is acting more agitated and confused.  Family is very concerned about his safety and agitation.  They reportedly live alone at home and she does not feel safe with him in his altered mental status  state.  Spoke with neurology who recommended MRI without contrast to evaluate for new stroke.  This was placed.  Due to patient's agitation will give Ativan.  Given his sensitivity medications in the past, will give IV Ativan initially 0.5 and then a second 0.5 if needed.  Due to his altered mental status and agitation, will call hospitalist service for admission for further monitoring.  Suspect postconcussive symptoms if MRI is negative.  12:08 AM Patient was unable to tolerate MRI and became extremely agitated.  Patient thrashing around and trying to pull a Foley catheter.  EKG shows the patient's QT was not prolonged earlier today, thus, he will be given Haldol to try to help relax him.  Admitting team will be  re-paged.  Will likely need MRI at some point during this admission with further sedation.  Dr. Alcario Drought with the hospitalist team will come see the patient for admission.  Final Clinical Impressions(s) / ED Diagnoses   Final diagnoses:  Altered mental status, unspecified altered mental status type  Traumatic injury of head, initial encounter  Multiple abrasions  Agitation  Urinary retention     Clinical Impression: 1. Altered mental status, unspecified altered mental status type   2. Traumatic injury of head, initial encounter   3. Multiple abrasions   4. Agitation   5. Urinary retention     Disposition: Admit  This note was prepared with assistance of Dragon voice recognition software. Occasional wrong-word or sound-a-like substitutions may have occurred due to the inherent limitations of voice recognition software.      Tegeler, Gwenyth Allegra, MD 04/26/18 0086    Tegeler, Gwenyth Allegra, MD 05/06/18 [phone]

## 2018-04-25 NOTE — ED Triage Notes (Signed)
Pt on a walk and fell hitting his head and right arm.  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 Pt said he got dizzy and fell.  Neg VAN at this time.

## 2018-04-25 NOTE — ED Notes (Signed)
Patient unable to urinate.  Bladder scan volume 999 ml. MD notified.

## 2018-04-26 ENCOUNTER — Inpatient Hospital Stay (HOSPITAL_COMMUNITY): Payer: Medicare Other

## 2018-04-26 DIAGNOSIS — A419 Sepsis, unspecified organism: Secondary | ICD-10-CM | POA: Diagnosis not present

## 2018-04-26 DIAGNOSIS — N390 Urinary tract infection, site not specified: Secondary | ICD-10-CM | POA: Diagnosis not present

## 2018-04-26 DIAGNOSIS — R509 Fever, unspecified: Secondary | ICD-10-CM | POA: Diagnosis not present

## 2018-04-26 DIAGNOSIS — J189 Pneumonia, unspecified organism: Secondary | ICD-10-CM | POA: Diagnosis present

## 2018-04-26 DIAGNOSIS — F329 Major depressive disorder, single episode, unspecified: Secondary | ICD-10-CM | POA: Diagnosis present

## 2018-04-26 DIAGNOSIS — I639 Cerebral infarction, unspecified: Secondary | ICD-10-CM | POA: Diagnosis not present

## 2018-04-26 DIAGNOSIS — Z8546 Personal history of malignant neoplasm of prostate: Secondary | ICD-10-CM | POA: Diagnosis not present

## 2018-04-26 DIAGNOSIS — Z79899 Other long term (current) drug therapy: Secondary | ICD-10-CM | POA: Diagnosis not present

## 2018-04-26 DIAGNOSIS — Z87891 Personal history of nicotine dependence: Secondary | ICD-10-CM | POA: Diagnosis not present

## 2018-04-26 DIAGNOSIS — Z87820 Personal history of traumatic brain injury: Secondary | ICD-10-CM | POA: Diagnosis not present

## 2018-04-26 DIAGNOSIS — I10 Essential (primary) hypertension: Secondary | ICD-10-CM | POA: Diagnosis not present

## 2018-04-26 DIAGNOSIS — R269 Unspecified abnormalities of gait and mobility: Secondary | ICD-10-CM | POA: Diagnosis not present

## 2018-04-26 DIAGNOSIS — R451 Restlessness and agitation: Secondary | ICD-10-CM | POA: Diagnosis not present

## 2018-04-26 DIAGNOSIS — K5909 Other constipation: Secondary | ICD-10-CM | POA: Diagnosis present

## 2018-04-26 DIAGNOSIS — Z23 Encounter for immunization: Secondary | ICD-10-CM | POA: Diagnosis not present

## 2018-04-26 DIAGNOSIS — R5081 Fever presenting with conditions classified elsewhere: Secondary | ICD-10-CM | POA: Diagnosis not present

## 2018-04-26 DIAGNOSIS — R338 Other retention of urine: Secondary | ICD-10-CM | POA: Diagnosis present

## 2018-04-26 DIAGNOSIS — G9340 Encephalopathy, unspecified: Secondary | ICD-10-CM | POA: Diagnosis not present

## 2018-04-26 DIAGNOSIS — Z86011 Personal history of benign neoplasm of the brain: Secondary | ICD-10-CM | POA: Diagnosis not present

## 2018-04-26 DIAGNOSIS — S0031XA Abrasion of nose, initial encounter: Secondary | ICD-10-CM | POA: Diagnosis present

## 2018-04-26 DIAGNOSIS — S0990XA Unspecified injury of head, initial encounter: Secondary | ICD-10-CM | POA: Diagnosis not present

## 2018-04-26 DIAGNOSIS — G92 Toxic encephalopathy: Secondary | ICD-10-CM | POA: Diagnosis present

## 2018-04-26 DIAGNOSIS — R5381 Other malaise: Secondary | ICD-10-CM | POA: Diagnosis not present

## 2018-04-26 DIAGNOSIS — R339 Retention of urine, unspecified: Secondary | ICD-10-CM | POA: Diagnosis not present

## 2018-04-26 DIAGNOSIS — Z9079 Acquired absence of other genital organ(s): Secondary | ICD-10-CM | POA: Diagnosis not present

## 2018-04-26 DIAGNOSIS — R7303 Prediabetes: Secondary | ICD-10-CM | POA: Diagnosis present

## 2018-04-26 DIAGNOSIS — R2689 Other abnormalities of gait and mobility: Secondary | ICD-10-CM | POA: Diagnosis not present

## 2018-04-26 DIAGNOSIS — R195 Other fecal abnormalities: Secondary | ICD-10-CM | POA: Diagnosis not present

## 2018-04-26 DIAGNOSIS — J690 Pneumonitis due to inhalation of food and vomit: Secondary | ICD-10-CM | POA: Diagnosis not present

## 2018-04-26 DIAGNOSIS — I69320 Aphasia following cerebral infarction: Secondary | ICD-10-CM | POA: Diagnosis not present

## 2018-04-26 DIAGNOSIS — J13 Pneumonia due to Streptococcus pneumoniae: Secondary | ICD-10-CM | POA: Diagnosis not present

## 2018-04-26 DIAGNOSIS — Z880 Allergy status to penicillin: Secondary | ICD-10-CM | POA: Diagnosis not present

## 2018-04-26 DIAGNOSIS — T07XXXA Unspecified multiple injuries, initial encounter: Secondary | ICD-10-CM | POA: Diagnosis not present

## 2018-04-26 DIAGNOSIS — Z85828 Personal history of other malignant neoplasm of skin: Secondary | ICD-10-CM | POA: Diagnosis not present

## 2018-04-26 DIAGNOSIS — R4701 Aphasia: Secondary | ICD-10-CM | POA: Diagnosis not present

## 2018-04-26 DIAGNOSIS — N401 Enlarged prostate with lower urinary tract symptoms: Secondary | ICD-10-CM | POA: Diagnosis present

## 2018-04-26 DIAGNOSIS — Z7982 Long term (current) use of aspirin: Secondary | ICD-10-CM | POA: Diagnosis not present

## 2018-04-26 DIAGNOSIS — R6520 Severe sepsis without septic shock: Secondary | ICD-10-CM

## 2018-04-26 DIAGNOSIS — E785 Hyperlipidemia, unspecified: Secondary | ICD-10-CM | POA: Diagnosis present

## 2018-04-26 DIAGNOSIS — K219 Gastro-esophageal reflux disease without esophagitis: Secondary | ICD-10-CM | POA: Diagnosis present

## 2018-04-26 DIAGNOSIS — W1830XA Fall on same level, unspecified, initial encounter: Secondary | ICD-10-CM | POA: Diagnosis not present

## 2018-04-26 LAB — RESPIRATORY PANEL BY PCR

## 2018-04-26 LAB — COMPREHENSIVE METABOLIC PANEL
ALT: 15 U/L (ref 0–44)
AST: 20 U/L (ref 15–41)
Albumin: 3.7 g/dL (ref 3.5–5.0)
Alkaline Phosphatase: 75 U/L (ref 38–126)
Anion gap: 12 (ref 5–15)
BUN: 18 mg/dL (ref 8–23)
CO2: 20 mmol/L — ABNORMAL LOW (ref 22–32)
Calcium: 9.2 mg/dL (ref 8.9–10.3)
Chloride: 108 mmol/L (ref 98–111)
Creatinine, Ser: 1.09 mg/dL (ref 0.61–1.24)
GFR calc Af Amer: >60 mL/min (ref >60)
GFR calc non Af Amer: >60 mL/min (ref >60)
Glucose, Bld: 136 mg/dL — ABNORMAL HIGH (ref 70–99)
Potassium: 3.7 mmol/L (ref 3.5–5.1)
Sodium: 140 mmol/L (ref 135–145)
Total Bilirubin: 0.9 mg/dL (ref 0.3–1.2)
Total Protein: 6.3 g/dL — ABNORMAL LOW (ref 6.5–8.1)

## 2018-04-26 LAB — CBC
HCT: 42.5 % (ref 39.0–52.0)
Hemoglobin: 14 g/dL (ref 13.0–17.0)
MCH: 28.6 pg (ref 26.0–34.0)
MCHC: 32.9 g/dL (ref 30.0–36.0)
MCV: 86.9 fL (ref 80.0–100.0)
Platelets: 216 10*3/uL (ref 150–400)
RBC: 4.89 MIL/uL (ref 4.22–5.81)
RDW: 13 % (ref 11.5–15.5)
WBC: 12.1 10*3/uL — ABNORMAL HIGH (ref 4.0–10.5)
nRBC: 0 % (ref 0.0–0.2)

## 2018-04-26 LAB — LACTIC ACID, PLASMA
Lactic Acid, Venous: 1.3 mmol/L (ref 0.5–1.9)
Lactic Acid, Venous: 1.1 mmol/L (ref 0.5–1.9)

## 2018-04-26 LAB — PROTIME-INR
INR: 1.13
Prothrombin Time: 14.4 seconds (ref 11.4–15.2)

## 2018-04-26 LAB — ECHOCARDIOGRAM COMPLETE: Weight: 2821.89 oz

## 2018-04-26 LAB — AMMONIA: Ammonia: 23 umol/L (ref 9–35)

## 2018-04-26 LAB — CORTISOL-AM, BLOOD: Cortisol - AM: 19.8 ug/dL (ref 6.7–22.6)

## 2018-04-26 LAB — PROCALCITONIN: Procalcitonin: <0.1 ng/mL

## 2018-04-26 LAB — LIPASE, BLOOD: Lipase: 29 U/L (ref 11–51)

## 2018-04-26 MED ORDER — DEXTROSE 5 % IV SOLN
750.0000 mg | Freq: Three times a day (TID) | INTRAVENOUS | Status: DC
  Filled 2018-04-26 (×2): qty 15

## 2018-04-26 MED ORDER — SERTRALINE HCL 100 MG PO TABS
100.0000 mg | ORAL_TABLET | Freq: Every day | ORAL | Status: DC
  Administered 2018-04-26 – 2018-04-28 (×3): 100 mg via ORAL
  Filled 2018-04-26 (×3): qty 1

## 2018-04-26 MED ORDER — ENOXAPARIN SODIUM 40 MG/0.4ML ~~LOC~~ SOLN
40.0000 mg | Freq: Every day | SUBCUTANEOUS | Status: DC
  Administered 2018-04-26 – 2018-04-28 (×3): 40 mg via SUBCUTANEOUS
  Filled 2018-04-26 (×3): qty 0.4

## 2018-04-26 MED ORDER — ACETAMINOPHEN 650 MG RE SUPP
650.0000 mg | Freq: Once | RECTAL | Status: AC
  Administered 2018-04-26: 650 mg via RECTAL
  Filled 2018-04-26: qty 1

## 2018-04-26 MED ORDER — VANCOMYCIN HCL 10 G IV SOLR
1500.0000 mg | Freq: Once | INTRAVENOUS | Status: AC
  Administered 2018-04-26: 1500 mg via INTRAVENOUS
  Filled 2018-04-26: qty 1500

## 2018-04-26 MED ORDER — METRONIDAZOLE IN NACL 5-0.79 MG/ML-% IV SOLN
500.0000 mg | Freq: Three times a day (TID) | INTRAVENOUS | Status: DC
  Administered 2018-04-26 (×2): 500 mg via INTRAVENOUS
  Filled 2018-04-26 (×2): qty 100

## 2018-04-26 MED ORDER — SODIUM CHLORIDE 0.9 % IV SOLN
1.0000 g | Freq: Three times a day (TID) | INTRAVENOUS | Status: DC
  Administered 2018-04-26 – 2018-04-28 (×7): 1 g via INTRAVENOUS
  Filled 2018-04-26 (×9): qty 1

## 2018-04-26 MED ORDER — VANCOMYCIN HCL IN DEXTROSE 750-5 MG/150ML-% IV SOLN: 750.0000 mg | Freq: Two times a day (BID) | INTRAVENOUS | Status: DC

## 2018-04-26 MED ORDER — BACITRACIN-NEOMYCIN-POLYMYXIN OINTMENT TUBE
TOPICAL_OINTMENT | Freq: Every day | CUTANEOUS | Status: DC
  Administered 2018-04-26 – 2018-04-28 (×3): via TOPICAL
  Filled 2018-04-26: qty 14

## 2018-04-26 MED ORDER — PERFLUTREN LIPID MICROSPHERE
1.0000 mL | INTRAVENOUS | Status: AC | PRN
  Administered 2018-04-26: 4 mL via INTRAVENOUS
  Filled 2018-04-26: qty 10

## 2018-04-26 MED ORDER — SODIUM CHLORIDE 0.9 % IV SOLN
2.0000 g | Freq: Once | INTRAVENOUS | Status: AC
  Administered 2018-04-26: 2 g via INTRAVENOUS
  Filled 2018-04-26: qty 2

## 2018-04-26 MED ORDER — QUETIAPINE FUMARATE 25 MG PO TABS: 12.5000 mg | ORAL_TABLET | Freq: Every day | ORAL | Status: DC

## 2018-04-26 MED ORDER — PANTOPRAZOLE SODIUM 40 MG PO TBEC
40.0000 mg | DELAYED_RELEASE_TABLET | Freq: Every day | ORAL | Status: DC
  Administered 2018-04-26 – 2018-04-28 (×3): 40 mg via ORAL
  Filled 2018-04-26 (×3): qty 1

## 2018-04-26 MED ORDER — QUETIAPINE FUMARATE 25 MG PO TABS: 12.5000 mg | ORAL_TABLET | Freq: Every evening | ORAL | Status: DC | PRN

## 2018-04-26 MED ORDER — CARVEDILOL 3.125 MG PO TABS
3.1250 mg | ORAL_TABLET | Freq: Two times a day (BID) | ORAL | Status: DC
  Administered 2018-04-26 – 2018-04-28 (×5): 3.125 mg via ORAL
  Filled 2018-04-26 (×5): qty 1

## 2018-04-26 MED ORDER — ACETAMINOPHEN 650 MG RE SUPP: 650.0000 mg | Freq: Four times a day (QID) | RECTAL | Status: DC | PRN

## 2018-04-26 MED ORDER — IRBESARTAN 150 MG PO TABS
150.0000 mg | ORAL_TABLET | Freq: Every evening | ORAL | Status: DC
  Administered 2018-04-26 – 2018-04-28 (×3): 150 mg via ORAL
  Filled 2018-04-26 (×3): qty 1

## 2018-04-26 MED ORDER — SODIUM CHLORIDE 0.9 % IV SOLN
INTRAVENOUS | Status: DC
  Administered 2018-04-26: 04:00:00 via INTRAVENOUS

## 2018-04-26 MED ORDER — ATORVASTATIN CALCIUM 40 MG PO TABS
40.0000 mg | ORAL_TABLET | Freq: Every day | ORAL | Status: DC
  Administered 2018-04-26 – 2018-04-28 (×3): 40 mg via ORAL
  Filled 2018-04-26 (×3): qty 1

## 2018-04-26 MED ORDER — LORAZEPAM 2 MG/ML IJ SOLN
1.0000 mg | Freq: Once | INTRAMUSCULAR | Status: AC
  Administered 2018-04-26: 1 mg via INTRAVENOUS
  Filled 2018-04-26: qty 1

## 2018-04-26 MED ORDER — ACETAMINOPHEN 325 MG PO TABS: 650.0000 mg | ORAL_TABLET | Freq: Four times a day (QID) | ORAL | Status: DC | PRN

## 2018-04-26 MED ORDER — OLANZAPINE 5 MG PO TBDP
2.5000 mg | ORAL_TABLET | ORAL | Status: AC
  Administered 2018-04-26: 2.5 mg via ORAL
  Filled 2018-04-26: qty 0.5

## 2018-04-26 MED ORDER — HALOPERIDOL LACTATE 5 MG/ML IJ SOLN
5.0000 mg | Freq: Once | INTRAMUSCULAR | Status: AC
  Administered 2018-04-26: 5 mg via INTRAVENOUS
  Filled 2018-04-26: qty 1

## 2018-04-26 NOTE — Progress Notes (Signed)
  Echocardiogram 2D Echocardiogram has been performed with Definity.  Eric George 04/26/2018, 12:13 PM

## 2018-04-26 NOTE — ED Notes (Signed)
Pt completely disoriented x4 w/ agitation and restlessly. Sz pads applied to rails to keep Pt inside of bed. RN was alerted by staffing, there are not sitters available

## 2018-04-26 NOTE — ED Notes (Signed)
Pt care at this time.

## 2018-04-26 NOTE — ED Notes (Signed)
Delay in lab work,  Admitting MD at bedside.

## 2018-04-26 NOTE — Evaluation (Signed)
Physical Therapy Evaluation Patient Details Name: Eric George MRN: [id number] DOB: Jun 29, 1930 Today's Date: 04/26/2018   History of Present Illness  84 y.o. male with medical history significant of TBI, HTN.  Patient presents to ED today after fall.  [address] and hit front of face.. Noted to be febrile in the ED with possible sepsis, work up underway.  Clinical Impression  Orders received for PT evaluation. Patient demonstrates deficits in functional mobility as indicated below. Will benefit from continued skilled PT to address deficits and maximize function. Will see as indicated and progress as tolerated.  Prior to admission, patient just finished at outpatient rehabilitation. Per wife patient was independent with mobility and was going for walks every day on his own. Patient currently requiring min to moderate assist for basic straight distance ambulation. At this time, feel patient would benefit from further post acute rehabilitation. Wife desires patient to reach independence again. Will recommend CIR consult at this tim.    Follow Up Recommendations CIR    Equipment Recommendations  None recommended by PT    Recommendations for Other Services Rehab consult     Precautions / Restrictions Precautions Precautions: Fall Restrictions Weight Bearing Restrictions: No      Mobility  Bed Mobility Overal bed mobility: Needs Assistance Bed Mobility: Rolling;Supine to Sit;Sit to Supine Rolling: Supervision   Supine to sit: Min assist Sit to supine: Min assist   General bed mobility comments: min assist for safety, increased effort noted, assist for elevation to upright and assist to return LEs to bed and reposition  Transfers Overall transfer level: Needs assistance Equipment used: 1 person hand held assist Transfers: Sit to/from Stand Sit to Stand: Min assist         General transfer comment: Min assist to power up to standing  Ambulation/Gait Ambulation/Gait assistance:  Min assist;Mod assist Gait Distance (Feet): 60 Feet Assistive device: 1 person hand held assist Gait Pattern/deviations: Step-through pattern;Decreased stride length;Shuffle;Trunk flexed;Drifts right/left     General Gait Details: Min assist at all times, moderate assist for multiple LOB, noted RLE lag with mobility  Stairs            Wheelchair Mobility    Modified Rankin (Stroke Patients Only) Modified Rankin (Stroke Patients Only) Pre-Morbid Rankin Score: Moderate disability Modified Rankin: Moderately severe disability     Balance Overall balance assessment: History of Falls                                           Pertinent Vitals/Pain      Home Living Family/patient expects to be discharged to:: Private residence Living Arrangements: Spouse/significant other Available Help at Discharge: Family;Available 24 hours/day Type of Home: House Home Access: Level entry     Home Layout: One level Home Equipment: Cane - single point      Prior Function Level of Independence: Needs assistance   Gait / Transfers Assistance Needed: uses a cane for mobility     Comments: recently working with outpatient rehab at [place] Hand: Right    Extremity/Trunk Assessment   Upper Extremity Assessment Upper Extremity Assessment: Generalized weakness    Lower Extremity Assessment Lower Extremity Assessment: Generalized weakness       Communication   Communication: Expressive difficulties  Cognition Arousal/Alertness: Awake/alert Behavior During Therapy: WFL for tasks assessed/performed Overall Cognitive Status: History  of cognitive impairments - at baseline                                        General Comments      Exercises     Assessment/Plan    PT Assessment Patient needs continued PT services  PT Problem List Decreased strength;Decreased activity tolerance;Decreased  balance;Decreased mobility;Decreased coordination;Decreased cognition;Decreased knowledge of use of DME;Decreased knowledge of precautions       PT Treatment Interventions DME instruction;Gait training;Stair training;Functional mobility training;Therapeutic activities;Therapeutic exercise;Balance training;Cognitive remediation;Patient/family education    PT Goals (Current goals can be found in the Care Plan section)  Acute Rehab PT Goals Patient Stated Goal: to get back to independence at home PT Goal Formulation: With patient/family Time For Goal Achievement: 05/10/18 Potential to Achieve Goals: Good    Frequency Min 3X/week   Barriers to discharge Decreased caregiver support      Co-evaluation               AM-PAC PT "6 Clicks" Mobility  Outcome Measure Help needed turning from your back to your side while in a flat bed without using bedrails?: A Little Help needed moving from lying on your back to sitting on the side of a flat bed without using bedrails?: A Little Help needed moving to and from a bed to a chair (including a wheelchair)?: A Little Help needed standing up from a chair using your arms (e.g., wheelchair or bedside chair)?: A Lot Help needed to walk in hospital room?: A Lot Help needed climbing 3-5 steps with a railing? : Total 6 Click Score: 14    End of Session Equipment Utilized During Treatment: Gait belt Activity Tolerance: No increased pain;Patient tolerated treatment well Patient left: in bed;with call bell/phone within reach;with bed alarm set;with family/visitor present Nurse Communication: Mobility status PT Visit Diagnosis: Unsteadiness on feet (R26.81);Difficulty in walking, not elsewhere classified (R26.2)    Time: 3568-6168 PT Time Calculation (min) (ACUTE ONLY): 21 min   Charges:   PT Evaluation $PT Eval Moderate Complexity: 1 Mod          Alben Deeds, PT DPT  Board Certified Neurologic Specialist Acute Rehabilitation  Services Pager [phone] Office [phone]   Duncan Dull 04/26/2018, 4:35 PM

## 2018-04-26 NOTE — Progress Notes (Addendum)
Pharmacy Antibiotic Note  Eric George is a 84 y.o. male admitted on 04/25/2018 with AMS s/p fall, possible sepsis/encephalitis.  Pharmacy has been consulted for Vancomycin and Cefepime and Acyclovir dosing.  Plan: Vancomycin 1500 mg IV now, then 750 mg IV q12h Cefepime 1 g IV q8h Acyclovir 750 mg IV q8h    Temp (24hrs), Avg:99.5 F (37.5 C), Min:98.2 F (36.8 C), Max:101 F (38.3 C)  Recent Labs  Lab 04/25/18 1735 04/25/18 1742  WBC 10.3  --   CREATININE 0.96 1.00    Estimated Creatinine Clearance: 62.8 mL/min (by C-G formula based on SCr of 1 mg/dL).    Allergies  Allergen Reactions  . Penicillins Rash    Has patient had a PCN reaction causing immediate rash, facial/tongue/throat swelling, SOB or lightheadedness with hypotension: Yes Has patient had a PCN reaction causing severe rash involving mucus membranes or skin necrosis: No Has patient had a PCN reaction that required hospitalization: No Has patient had a PCN reaction occurring within the last 10 years: No If all of the above answers are "NO", then may proceed with Cephalosporin use.    Caryl Pina 04/26/2018 3:27 AM

## 2018-04-26 NOTE — Progress Notes (Addendum)
[facility]  Patient admitted by my collegue this AM. I have reviewed the chart and had at least a 20 min conversation with his wife in the presence of the RN.  Exam: restless, trying to pull foley, lung clear, heart RRR, recognized wife (she admits this is an improvement from yesterday), abdomen soft, nontender, non distended with normal Bowel sounds. No pedal edema. No thrush.  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 Per wife, his cognitive state has been steadily getting worse (she calls it old age and then Alzheimer's). He has seen an neurologist for it in the past. Also,was rcently treated for a pneumonia with 7 days of Levaquin by his PCP which finished last Thursday. Has had a recurrence of cough since then. Upon reviewing PCP notes, there are multiple mentions of him having a chronic cough. On the day of admission, he fell and hit his face and right arm.    In ED> unable to urinate- bladder scan done showing 1 L - foley placed with approximately 1 L return. UA neg. ED spoke with Neuro over the phone who recommended an MRI for his confusion- wife did not feel comfortable taking him home with the level of confusion. Overnight> fever 101- started on Cefepime, Acylcovir, Flagyl and Vanc.  Principal Problem:   Sepsis - despite Urinary retention, UA was negative - CXR today- > bibasilar atelectasis vs air space disease - I will treat this as a pneumonia - d/c Vanc, Acyclovir and Flagyl and continue on Cefepime for now - blood cultures pending  Active Problems:     Acute encephalopathy - ammonia normal, CT head normal - he is better today as he now recognizes his wife- she does not feel an MRI is necessary- he has no  focal CVA symptoms- I have d/c'd MRI for now - per RN and wife, Ativan did not help his agitation - I ordered 1 small dose of Zyprexa  (2.5 mg) which has helped him calm down and stop wanting to pull  foley- RN also adjusted foley - will order low dose Seroquel PRN at night -cont Zoloft - sitter at bedside  Urinary retention - h/o prostatectomy/ prostate CA - seen in ED on 12/15 for "decreased urine output", bladder scan did not show any urine- foley placed and no significant urine output noted- began to urinate after being given IVF - based on above findings in the ED,  I recommend a voiding trial in AM- have ordered foley to be discontinued at 7 AM. Even if he voids, will need to check a post void residual to see if he is retaining urine.  Fall - fell on side walk when he went on his BID walk - per wife, he shuffles his feet and has PT at home 2 x a week to prevent falls - obtain PT eval  Abrasion to nose - is bleeding- order neosporin and bangage   HTN - cont Avapro and Coreg  Transfer to med/surg.   Debbe Odea, MD  Prolonged care:   (9:10 AM- 9:50 AM)

## 2018-04-26 NOTE — H&P (Addendum)
History and Physical    Eric George FYB:[id number] DOB: 11-Dec-1930 DOA: 04/25/2018  PCP: Eulas Post, MD  Patient coming from: Home  I have personally briefly reviewed patient's old medical records in [place]  Chief Complaint: AMS  HPI: Eric George is a 84 y.o. male with medical history significant of TBI, HTN.  Patient presents to ED today after fall.  [address] and hit front of face.  ED Course: CT head, chest, abd / pelvis neg.  While in the ED patient developed AMS, confusion, agitation.  Sedated with halidol.  Also had urinary retention with 1L in bladder.  Hospitalist asked to admit.  On my evaluation patient has also developed fever, tachypnea, tachycardia.  All of which is new onset (very rapid onset) since arrival to ED tonight.   ROS difficult due to AMS, patient does say he has mild headache, no neck stiffness.  Review of Systems: Unable to perform due to AMS.  Past Medical History:  Diagnosis Date  . Bowel obstruction ([place])   . CAP (community acquired pneumonia)   . 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 GERD (gastroesophageal reflux disease)   . Hydrocephalus ([place])   . Hyperlipidemia   . Hypertension   . Osteoarthritis, multiple sites 07/08/2016  . 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 Stroke ([place])   . TBI (traumatic brain injury) [facility])     Past Surgical History:  Procedure Laterality Date  . BRAIN SURGERY    . HERNIA REPAIR    . PROSTATECTOMY    . TEE WITHOUT CARDIOVERSION  03/22/2012   Procedure: TRANSESOPHAGEAL ECHOCARDIOGRAM (TEE);  Surgeon: [name], MD;  Location: [facility] ENDOSCOPY;  Service: Cardiovascular;  Laterality: N/A;     reports that he quit smoking about 51 years ago. His smoking use included cigarettes. He has a 7.50 pack-year smoking history. He has never used smokeless tobacco. He reports that he does not drink alcohol or use drugs.  Allergies  Allergen Reactions  .  Penicillins Rash    Has patient had a PCN reaction causing immediate rash, facial/tongue/throat swelling, SOB or lightheadedness with hypotension: Yes Has patient had a PCN reaction causing severe rash involving mucus membranes or skin necrosis: No Has patient had a PCN reaction that required hospitalization: No Has patient had a PCN reaction occurring within the last 10 years: No If all of the above answers are "NO", then may proceed with Cephalosporin use.    Family History  Problem Relation Age of Onset  . Heart disease Sister   . Atrial fibrillation Sister   . Uterine cancer Sister      Prior to Admission medications   Medication Sig Start Date End Date Taking? Authorizing Provider  acetaminophen (TYLENOL) 325 MG tablet Take 325-650 mg by mouth every 6 (six) hours as needed for mild pain.    Yes [provider]  acetic acid-hydrocortisone (VOSOL-HC) otic solution Place 5 drops into both ears every 30 (thirty) days.  02/06/14  Yes [provider]  aspirin 325 MG tablet Take 1 tablet (325 mg total) by mouth daily. 03/23/12  Yes Eugenie Filler, MD  atorvastatin (LIPITOR) 40 MG tablet TAKE 1 TABLET DAILY AT 6PM Patient taking differently: Take 40 mg by mouth daily at 6 PM.  01/24/18  Yes Burchette, Alinda Sierras, MD  carvedilol (COREG) 3.125 MG tablet TAKE 1 TABLET TWICE A DAY  WITH MEALS Patient  taking differently: Take 3.125 mg by mouth 2 (two) times daily with a meal.  02/21/18  Yes Burchette, Alinda Sierras, MD  irbesartan (AVAPRO) 150 MG tablet Take 1 tablet (150 mg total) by mouth daily. Patient taking differently: Take 150 mg by mouth every evening.  01/06/18  Yes Burchette, Alinda Sierras, MD  Multiple Vitamins-Minerals (MACULAR VITAMIN BENEFIT) TABS Take 1 capsule by mouth daily.   Yes [provider]  omeprazole (PRILOSEC) 40 MG capsule Take 40 mg by mouth daily.   Yes [provider]  Probiotic Product (ALIGN) 4 MG CAPS Take 1 capsule by mouth daily.   Yes  [provider]  sertraline (ZOLOFT) 100 MG tablet TAKE 1 TABLET DAILY.       (PLEASE SCHEDULE A PHYSICALFOR MORE REFILLS. THANKS.) Patient taking differently: Take 100 mg by mouth daily.  02/21/18  Yes Burchette, Alinda Sierras, MD  levofloxacin (LEVAQUIN) 500 MG tablet Take 1 tablet (500 mg total) by mouth daily. Patient not taking: Reported on 04/25/2018 04/15/18   Eulas Post, MD    Physical Exam: Vitals:   04/26/18 0307 04/26/18 0310 04/26/18 0311 04/26/18 0315  BP: (!) 153/94 (!) 156/70  (!) 141/55  Pulse: (!) 108  100 (!) 111  Resp: (!) 22 13 (!) 22 (!) 23  Temp:      TempSrc:      SpO2: 96%  93% 95%    Constitutional: Sedated Eyes: PERRL, lids and conjunctivae normal ENMT: Mucous membranes are moist. Posterior pharynx clear of any exudate or lesions.Normal dentition.  Neck: normal, supple, no masses, no thyromegaly Respiratory: clear to auscultation bilaterally, no wheezing, no crackles. Normal respiratory effort. No accessory muscle use.  Cardiovascular: Tachycardic, murmur present. Abdomen: no tenderness, no masses palpated. No hepatosplenomegaly. Bowel sounds positive.  Musculoskeletal: no clubbing / cyanosis. No joint deformity upper and lower extremities. Good ROM, no contractures. Normal muscle tone.  Skin: no rashes, lesions, ulcers. No induration Neurologic: CN 2-12 grossly intact. Sensation intact, DTR normal. Strength 5/5 in all 4.  Psychiatric: altered, sedated, confused  Labs on Admission: I have personally reviewed following labs and imaging studies  CBC: Recent Labs  Lab 04/25/18 1735 04/25/18 1742  WBC 10.3  --   NEUTROABS 7.1  --   HGB 14.7 14.6  HCT 46.0 43.0  MCV 90.2  --   PLT 213  --    Basic Metabolic Panel: Recent Labs  Lab 04/25/18 1735 04/25/18 1742  NA 136 138  K 4.4 4.1  CL 111 110  CO2 20*  --   GLUCOSE 108* 108*  BUN 20 22  CREATININE 0.96 1.00  CALCIUM 9.6  --    GFR: Estimated Creatinine Clearance: 62.8 mL/min  (by C-G formula based on SCr of 1 mg/dL). Liver Function Tests: Recent Labs  Lab 04/25/18 1735  AST 20  ALT 13  ALKPHOS 74  BILITOT 0.6  PROT 6.1*  ALBUMIN 3.7   No results for input(s): LIPASE, AMYLASE in the last 168 hours. No results for input(s): AMMONIA in the last 168 hours. Coagulation Profile: Recent Labs  Lab 04/25/18 1735  INR 1.04   Cardiac Enzymes: No results for input(s): CKTOTAL, CKMB, CKMBINDEX, TROPONINI in the last 168 hours. BNP (last 3 results) No results for input(s): PROBNP in the last 8760 hours. HbA1C: No results for input(s): HGBA1C in the last 72 hours. CBG: Recent Labs  Lab 04/25/18 1908  GLUCAP 92   Lipid Profile: No results for input(s): CHOL, HDL, LDLCALC, TRIG, CHOLHDL,  LDLDIRECT in the last 72 hours. Thyroid Function Tests: No results for input(s): TSH, T4TOTAL, FREET4, T3FREE, THYROIDAB in the last 72 hours. Anemia Panel: No results for input(s): VITAMINB12, FOLATE, FERRITIN, TIBC, IRON, RETICCTPCT in the last 72 hours. Urine analysis:    Component Value Date/Time   COLORURINE STRAW (A) 04/25/2018 2128   APPEARANCEUR CLEAR 04/25/2018 2128   LABSPEC 1.014 04/25/2018 2128   PHURINE 6.0 04/25/2018 2128   GLUCOSEU NEGATIVE 04/25/2018 2128   HGBUR NEGATIVE 04/25/2018 2128   BILIRUBINUR NEGATIVE 04/25/2018 2128   KETONESUR NEGATIVE 04/25/2018 2128   PROTEINUR NEGATIVE 04/25/2018 2128   UROBILINOGEN 0.2 12/22/2013 0956   NITRITE NEGATIVE 04/25/2018 2128   LEUKOCYTESUR NEGATIVE 04/25/2018 2128    Radiological Exams on Admission: Ct Head Wo Contrast  Result Date: 04/25/2018 CLINICAL DATA:  Patient was on a walk-in fell hitting his head. History of prior stroke with speech deficits. EXAM: CT HEAD WITHOUT CONTRAST TECHNIQUE: Contiguous axial images were obtained from the base of the skull through the vertex without intravenous contrast. COMPARISON:  11/16/2017 FINDINGS: Brain: Right frontal and left temporoparietal lobe encephalomalacia  from remote infarcts. Chronic moderate small vessel ischemia. Superficial and central atrophy is redemonstrated without significant change. Midline fourth ventricle and basal cisterns without effacement. Brainstem and cerebellum appear nonacute. Vascular: Atherosclerosis at the skull base. No hyperdense vessel sign. Skull: Right frontal craniotomy defect. No acute osseous abnormality. Sinuses/Orbits: Moderate ethmoid and mild sphenoid sinus mucosal thickening. Intact orbits and globes. Bilateral cataract surgery. Other: None. IMPRESSION: 1. No acute intracranial abnormality. 2. Right frontal and left temporoparietal lobe encephalomalacia from remote infarcts. 3. Chronic moderate small vessel ischemia. 4. Moderate ethmoid and mild sphenoid sinus mucosal thickening. Electronically Signed   By: Ashley Royalty M.D.   On: 04/25/2018 20:56   Ct Chest W Contrast  Result Date: 04/25/2018 CLINICAL DATA:  Dizziness, fall EXAM: CT CHEST, ABDOMEN, AND PELVIS WITH CONTRAST TECHNIQUE: Multidetector CT imaging of the chest, abdomen and pelvis was performed following the standard protocol during bolus administration of intravenous contrast. CONTRAST:  24mL OMNIPAQUE IOHEXOL 300 MG/ML  SOLN COMPARISON:  CT abdomen/pelvis dated 01/17/2017 FINDINGS: CT CHEST FINDINGS Cardiovascular: No evidence of traumatic aortic injury. No evidence of thoracic aortic aneurysm or dissection. Atherosclerotic calcifications of the aortic arch. The heart is normal in size.  No pericardial effusion. Mild coronary atherosclerosis of the LAD and right coronary artery. Mediastinum/Nodes: No evidence of mediastinal hematoma. No suspicious mediastinal lymphadenopathy. Visualized thyroid is grossly unremarkable. Lungs/Pleura: Biapical pleural-parenchymal scarring. Mild centrilobular and paraseptal emphysematous changes, upper lobe predominant. Mild subpleural reticulation/dependent atelectasis in the bilateral lower lobes. Evaluation of the lung parenchyma  is constrained by respiratory motion. Within that constraint, there are no suspicious pulmonary nodules. No focal consolidation. No pleural effusion or pneumothorax. Musculoskeletal: Degenerative changes of the thoracic spine. No fracture is seen. CT ABDOMEN PELVIS FINDINGS Hepatobiliary: 9 mm probable cyst in the left hepatic dome (series 3/image 46). 12 mm probable cyst inferiorly in the right hepatic lobe (series 3/image 62). Gallbladder is unremarkable. No intrahepatic or extrahepatic ductal dilatation. Pancreas: Within normal limits. Spleen: Within normal limits. Adrenals/Urinary Tract: Adrenal glands are within normal limits. 2.0 cm hyperdense/hemorrhagic cyst along the medial left upper kidney (series 3/image 87), unchanged. Bilateral renal cysts, simple. Cortical scarring along the posterior right lower kidney. 2 mm nonobstructing right lower pole renal calculus (series 3/image 39). No hydronephrosis. Bladder is within normal limits. Stomach/Bowel: Stomach is notable for a tiny hiatal hernia. No evidence of bowel obstruction. Normal appendix (  series 3/image 108). Left colonic diverticulosis, without evidence of diverticulitis. Vascular/Lymphatic: No evidence of abdominal aortic aneurysm. Atherosclerotic calcifications of the abdominal aorta and branch vessels. No suspicious abdominopelvic lymphadenopathy. Status post pelvic lymph node dissection. Reproductive: Status post prostatectomy. Other: No abdominopelvic ascites. No hemoperitoneum or free air. Musculoskeletal: Mild degenerative changes of the lumbar spine. No fracture is seen. IMPRESSION: No evidence of traumatic injury to the chest, abdomen, or pelvis. Status post prostatectomy. No evidence of recurrent or metastatic disease. 2 cm hyperdense/hemorrhagic cyst along the medial left upper kidney, benign (Bosniak II). Additional stable ancillary findings as above. Electronically Signed   By: Julian Hy M.D.   On: 04/25/2018 20:58   Ct Cervical  Spine Wo Contrast  Result Date: 04/25/2018 CLINICAL DATA:  Neck pain after fall EXAM: CT CERVICAL SPINE WITHOUT CONTRAST TECHNIQUE: Multidetector CT imaging of the cervical spine was performed without intravenous contrast. Multiplanar CT image reconstructions were also generated. COMPARISON:  None. FINDINGS: Alignment: Maintained cervical lordosis. Skull base and vertebrae: Intact skull base. Subcortical degenerative cystic change of the odontoid process is noted. Partial ankylosis across the C2 through C5 interspaces. Soft tissues and spinal canal: No prevertebral fluid or swelling. No visible canal hematoma. Disc levels: Cervical spondylosis with moderate-to-marked disc flattening from C2 through C7 as well as moderate disc flattening C7-T1 and mild from T1 through T4. Facet ankylosis is noted C2-C4 on the left. Bilateral facet arthrosis with facet joint space narrowing and hypertrophy is seen. Uncovertebral joint osteoarthritis is noted bilaterally from C3 through C7. Upper chest: Emphysematous changes at the apices both lungs. Aortic atherosclerosis of the included aortic arch is identified. 5 mm nodular density in the left upper lobe. Other: Extracranial carotid arteriosclerosis is seen bilaterally. IMPRESSION: 1. Cervical spondylosis without acute fracture or posttraumatic subluxation. 2. Emphysema. 3. 5 mm left upper lobe pulmonary nodule. No follow-up needed if patient is low-risk. Non-contrast chest CT can be considered in 12 months if patient is high-risk. This recommendation follows the consensus statement: Guidelines for Management of Incidental Pulmonary Nodules Detected on CT Images: From the Fleischner Society 2017; Radiology 2017; 284:228-243. Aortic Atherosclerosis (ICD10-I70.0) and Emphysema (ICD10-J43.9). Electronically Signed   By: Ashley Royalty M.D.   On: 04/25/2018 21:00   Ct Abdomen Pelvis W Contrast  Result Date: 04/25/2018 CLINICAL DATA:  Dizziness, fall EXAM: CT CHEST, ABDOMEN, AND  PELVIS WITH CONTRAST TECHNIQUE: Multidetector CT imaging of the chest, abdomen and pelvis was performed following the standard protocol during bolus administration of intravenous contrast. CONTRAST:  43mL OMNIPAQUE IOHEXOL 300 MG/ML  SOLN COMPARISON:  CT abdomen/pelvis dated 01/17/2017 FINDINGS: CT CHEST FINDINGS Cardiovascular: No evidence of traumatic aortic injury. No evidence of thoracic aortic aneurysm or dissection. Atherosclerotic calcifications of the aortic arch. The heart is normal in size.  No pericardial effusion. Mild coronary atherosclerosis of the LAD and right coronary artery. Mediastinum/Nodes: No evidence of mediastinal hematoma. No suspicious mediastinal lymphadenopathy. Visualized thyroid is grossly unremarkable. Lungs/Pleura: Biapical pleural-parenchymal scarring. Mild centrilobular and paraseptal emphysematous changes, upper lobe predominant. Mild subpleural reticulation/dependent atelectasis in the bilateral lower lobes. Evaluation of the lung parenchyma is constrained by respiratory motion. Within that constraint, there are no suspicious pulmonary nodules. No focal consolidation. No pleural effusion or pneumothorax. Musculoskeletal: Degenerative changes of the thoracic spine. No fracture is seen. CT ABDOMEN PELVIS FINDINGS Hepatobiliary: 9 mm probable cyst in the left hepatic dome (series 3/image 46). 12 mm probable cyst inferiorly in the right hepatic lobe (series 3/image 62). Gallbladder is unremarkable. No intrahepatic or  extrahepatic ductal dilatation. Pancreas: Within normal limits. Spleen: Within normal limits. Adrenals/Urinary Tract: Adrenal glands are within normal limits. 2.0 cm hyperdense/hemorrhagic cyst along the medial left upper kidney (series 3/image 87), unchanged. Bilateral renal cysts, simple. Cortical scarring along the posterior right lower kidney. 2 mm nonobstructing right lower pole renal calculus (series 3/image 39). No hydronephrosis. Bladder is within normal limits.  Stomach/Bowel: Stomach is notable for a tiny hiatal hernia. No evidence of bowel obstruction. Normal appendix (series 3/image 108). Left colonic diverticulosis, without evidence of diverticulitis. Vascular/Lymphatic: No evidence of abdominal aortic aneurysm. Atherosclerotic calcifications of the abdominal aorta and branch vessels. No suspicious abdominopelvic lymphadenopathy. Status post pelvic lymph node dissection. Reproductive: Status post prostatectomy. Other: No abdominopelvic ascites. No hemoperitoneum or free air. Musculoskeletal: Mild degenerative changes of the lumbar spine. No fracture is seen. IMPRESSION: No evidence of traumatic injury to the chest, abdomen, or pelvis. Status post prostatectomy. No evidence of recurrent or metastatic disease. 2 cm hyperdense/hemorrhagic cyst along the medial left upper kidney, benign (Bosniak II). Additional stable ancillary findings as above. Electronically Signed   By: Julian Hy M.D.   On: 04/25/2018 20:58   Dg Hand Complete Right  Result Date: 04/25/2018 CLINICAL DATA:  Recent fall with right hand pain, initial encounter EXAM: RIGHT HAND - COMPLETE 3+ VIEW COMPARISON:  None. FINDINGS: Mild degenerative changes are noted within the carpal bones. Some mild interphalangeal degenerative changes are noted as well. No acute fracture or dislocation is identified. IMPRESSION: Mild degenerative change without acute bony abnormality. Electronically Signed   By: Inez Catalina M.D.   On: 04/25/2018 20:45   Ct Maxillofacial Wo Contrast  Result Date: 04/25/2018 CLINICAL DATA:  Blunt trauma status post fall. EXAM: CT MAXILLOFACIAL WITHOUT CONTRAST TECHNIQUE: Multidetector CT imaging of the maxillofacial structures was performed. Multiplanar CT image reconstructions were also generated. COMPARISON:  11/16/2017 head CT FINDINGS: Osseous: No fracture or mandibular dislocation. Degenerative subcortical cystic change of the odontoid process. Slight asymmetry across the  L[place]us dental likely degenerative in etiology. No acute fracture Orbits: Negative. No traumatic or inflammatory finding. Bilateral cataract extractions. Sinuses: Moderate ethmoid and bilateral maxillary and sphenoid sinus mucosal thickening is seen. Air-fluid levels Soft tissues: Right premalar soft tissue swelling and contusion. Limited intracranial: Right frontal and left temporoparietal encephalomalacia from remote infarcts. IMPRESSION: 1. No evidence of acute maxillofacial fracture. 2. Right premalar soft tissue swelling and contusion. 3. Moderate paranasal sinus disease. 4. Remote infarcts right frontal and left temporoparietal distribution. Electronically Signed   By: Ashley Royalty M.D.   On: 04/25/2018 21:07    EKG: Independently reviewed.  Assessment/Plan Principal Problem:   Sepsis ([place]) Active Problems:   Essential hypertension   Acute encephalopathy    1. Sepsis - unclear source 1. Influenza PCR pending 2. Sepsis pathway 3. IVF: NS at 100 4. procalcitonin 5. BCx 6. Empiric cefepime, flagyl, vanc, and will add acyclovir for possible HSV encephalitis 7. No meningismus on exam, though he may or may not end up needing LP in AM depending on results of testing 8. Serial lactates 9. Tele monitor 10. Tylenol PRN fever 2. HTN - holding home BP meds, patient NPO currently 3. Acute encephalopathy - 1. likely secondary to sepsis 2. Unable to obtain MRI brain due to agitation, try again later during admit 4. Urinary retention - 1. Foley placed 2. Interestingly had urinary retention symptoms a week ago or so which brought him in to [facility] ED, bladder scan at that time was negative. 3. Suspect this may  not be new onset tonight. 4. Certainly neurogenic bladder seems less likely with strong BLE movement reported by EDP prior to sedation.  DVT prophylaxis: Lovenox Code Status: Full Family Communication: Family at bedside Disposition Plan: Home after admit Consults called: None Admission  status: Admit to inpatient  Severity of Illness: The appropriate patient status for this patient is INPATIENT. Inpatient status is judged to be reasonable and necessary in order to provide the required intensity of service to ensure the patient's safety. The patient's presenting symptoms, physical exam findings, and initial radiographic and laboratory data in the context of their chronic comorbidities is felt to place them at high risk for further clinical deterioration. Furthermore, it is not anticipated that the patient will be medically stable for discharge from the hospital within 2 midnights of admission. The following factors support the patient status of inpatient.   " The patient's presenting symptoms include Fall, patient with rapidly developing AMS, fever while in ED. " The worrisome physical exam findings include AMS, fever, tachycardia, tachypnea.    * I certify that at the point of admission it is my clinical judgment that the patient will require inpatient hospital care spanning beyond 2 midnights from the point of admission due to high intensity of service, high risk for further deterioration and high frequency of surveillance required.Etta Quill DO [facility] Pager [phone] Only works nights!  If 7AM-7PM, please contact the primary day team physician taking care of patient  [email or web address] Password [facility]  04/26/2018, 3:38 AM

## 2018-04-27 ENCOUNTER — Inpatient Hospital Stay (HOSPITAL_COMMUNITY): Payer: Medicare Other

## 2018-04-27 ENCOUNTER — Encounter (HOSPITAL_COMMUNITY): Payer: Self-pay | Admitting: General Practice

## 2018-04-27 DIAGNOSIS — G9340 Encephalopathy, unspecified: Secondary | ICD-10-CM

## 2018-04-27 LAB — URINE CULTURE: Culture: NO GROWTH

## 2018-04-27 LAB — VITAMIN B12: Vitamin B-12: 562 pg/mL (ref 180–914)

## 2018-04-27 LAB — TSH: TSH: 1.433 u[IU]/mL (ref 0.350–4.500)

## 2018-04-27 LAB — AMMONIA: Ammonia: 33 umol/L (ref 9–35)

## 2018-04-27 MED ORDER — POLYETHYLENE GLYCOL 3350 17 G PO PACK
17.0000 g | PACK | ORAL | Status: DC
  Administered 2018-04-27: 17 g via ORAL
  Filled 2018-04-27: qty 1

## 2018-04-27 MED ORDER — LORAZEPAM 2 MG/ML IJ SOLN
0.5000 mg | Freq: Once | INTRAMUSCULAR | Status: AC
  Administered 2018-04-27: 0.5 mg via INTRAVENOUS
  Filled 2018-04-27: qty 1
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
## 2018-04-27 NOTE — Progress Notes (Signed)
Pt. Has used the bathroom. Post void residual volume is 0 ml.

## 2018-04-27 NOTE — Progress Notes (Signed)
PROGRESS NOTE    Eric George   Eric George  DOB: January 14, 1931  DOA: 04/25/2018 PCP: Eric Post, MD   Brief Narrative:  DEMETRIA George is a 84 y.o. male.  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 Per wife, his cognitive state has been steadily getting worse (she calls it old age and then Alzheimer's). He has seen an neurologist for it in the past. Also,was rcently treated for a pneumonia with 7 days of Levaquin by his PCP which finished last Thursday. Has had a recurrence of cough since then. Upon reviewing PCP notes, there are multiple mentions of him having a chronic cough. On the day of admission, he fell on the pavement and hit his face and right arm.  In ED> unable to urinate- bladder scan done showing 1 L - foley placed with approximately 1 L return. UA neg. ED spoke with Neuro over the phone who recommended an MRI for his confusion- wife did not feel comfortable taking him home with the level of confusion. Overnight> fever 101- started on Cefepime, Acylcovir, Flagyl and Vanc.  Subjective: Yesterday the wife told me that his cognitive state has been steadily worsening but today she states that his mental status changes are completely new. He is better than when he was admitted. She is in agreement with removing the foley today and doing a voiding trial   Assessment & Plan:   Sepsis - despite Urinary retention, UA was negative - CXR  > bibasilar atelectasis vs air space disease - I will treat this as a pneumonia - d/c Vanc, Acyclovir and Flagyl and continue on Cefepime for now - blood cultures negative - resp virus panel negative - no recurrence of fever  Active Problems:     Acute encephalopathy- h/o TBI with cognitive deficits in the past - ammonia normal, vit b12 normal, TSH normal, CT head normal - he is better as he now recognizes his wife-  - per RN and wife, Ativan did not help his  agitation -12/24 -  I ordered 1 small dose of Zyprexa  (2.5 mg) which has helped him calm down and stop wanting to pull foley-   - have also ordered low dose Seroquel PRN at night -cont Zoloft - I suspect the mental status changes may be due to underlying infection superimposed by a possible concussion by falling and striking his head on the pavement  - wife would like a neuro eval and I have consulted Neuro - MRI ordered    Urinary retention - h/o prostatectomy/ prostate CA - seen in ED on 12/15 for "decreased urine output", bladder scan did not show any urine- foley placed and no significant urine output noted- began to urinate after being given IVF - based on above findings in the ED,  we have removed the foley and are doing a voiding trial - have told RN that she will need to check a George void residual to see if he is retaining urine.  Fall - fell on side walk when he went on his BID walk - per wife, he shuffles his feet and has PT at home 2 x a week to prevent falls - obtained PT eval- Pt recommended CIR whom I have consulted  Abrasion to nose - which is bleeding- ordered neosporin and bangage   HTN - cont Avapro and Coreg  DVT prophylaxis: Lovenox Code Status: Full code Family Communication: wife  at bedside Disposition Plan: follow on med surg Consultants:   neuro Procedures:   Foley cath Antimicrobials:  Anti-infectives (From admission, onward)   Start     Dose/Rate Route Frequency Ordered Stop   04/26/18 1800  vancomycin (VANCOCIN) IVPB 750 mg/150 ml premix  Status:  Discontinued     750 mg 150 mL/hr over 60 Minutes Intravenous Every 12 hours 04/26/18 0403 04/26/18 0934   04/26/18 1400  ceFEPIme (MAXIPIME) 1 g in sodium chloride 0.9 % 100 mL IVPB     1 g 200 mL/hr over 30 Minutes Intravenous Every 8 hours 04/26/18 0403     04/26/18 0600  acyclovir (ZOVIRAX) 750 mg in dextrose 5 % 150 mL IVPB  Status:  Discontinued     750 mg 165 mL/hr over 60 Minutes  Intravenous Every 8 hours 04/26/18 0403 04/26/18 0934   04/26/18 0500  vancomycin (VANCOCIN) 1,500 mg in sodium chloride 0.9 % 500 mL IVPB     1,500 mg 250 mL/hr over 120 Minutes Intravenous  Once 04/26/18 0320 04/26/18 0820   04/26/18 0400  metroNIDAZOLE (FLAGYL) IVPB 500 mg  Status:  Discontinued     500 mg 100 mL/hr over 60 Minutes Intravenous Every 8 hours 04/26/18 0320 04/26/18 1609   04/26/18 0330  ceFEPIme (MAXIPIME) 2 g in sodium chloride 0.9 % 100 mL IVPB     2 g 200 mL/hr over 30 Minutes Intravenous  Once 04/26/18 0320 04/26/18 0426       Objective: Vitals:   04/27/18 0354 04/27/18 0538 04/27/18 0833 04/27/18 1136  BP: (!) 163/90 (!) 161/97 121/85 140/74  Pulse: 73  93 82  Resp: 18  18 16   Temp: 97.9 F (36.6 C)  98 F (36.7 C) 98 F (36.7 C)  TempSrc: Oral  Oral Oral  SpO2: 96%  95% 94%  Weight:        Intake/Output Summary (Last 24 hours) at 04/27/2018 1410 Last data filed at 04/27/2018 1005 Gross per 24 hour  Intake 1493.67 ml  Output 1700 ml  Net -206.33 ml   Filed Weights   04/26/18 0454  Weight: 80 kg    Examination: General exam: Appears comfortable  HEENT: PERRLA, oral mucosa moist, no sclera icterus or thrush Respiratory system: Clear to auscultation. Respiratory effort normal. Cardiovascular system: S1 & S2 heard, RRR.   Gastrointestinal system: Abdomen soft, non-tender, nondistended. Normal bowel sounds. Central nervous system: Alert and oriented to place and person when awakened but otherwise sleeping. No focal neurological deficits. Extremities: No cyanosis, clubbing or edema Skin: No rashes or ulcers Psychiatry:  Mood & affect appropriate.     Data Reviewed: I have personally reviewed following labs and imaging studies  CBC: Recent Labs  Lab 04/25/18 1735 04/25/18 1742 04/26/18 0418  WBC 10.3  --  12.1*  NEUTROABS 7.1  --   --   HGB 14.7 14.6 14.0  HCT 46.0 43.0 42.5  MCV 90.2  --  86.9  PLT 213  --  789   Basic Metabolic  Panel: Recent Labs  Lab 04/25/18 1735 04/25/18 1742 04/26/18 0418  NA 136 138 140  K 4.4 4.1 3.7  CL 111 110 108  CO2 20*  --  20*  GLUCOSE 108* 108* 136*  BUN 20 22 18   CREATININE 0.96 1.00 1.09  CALCIUM 9.6  --  9.2   GFR: Estimated Creatinine Clearance: 56.1 mL/min (by C-G formula based on SCr of 1.09 mg/dL). Liver Function Tests: Recent Labs  Lab 04/25/18 1735 04/26/18  0418  AST 20 20  ALT 13 15  ALKPHOS 74 75  BILITOT 0.6 0.9  PROT 6.1* 6.3*  ALBUMIN 3.7 3.7   Recent Labs  Lab 04/26/18 0418  LIPASE 29   Recent Labs  Lab 04/26/18 0417 04/27/18 1240  AMMONIA 23 33   Coagulation Profile: Recent Labs  Lab 04/25/18 1735 04/26/18 0418  INR 1.04 1.13   Cardiac Enzymes: No results for input(s): CKTOTAL, CKMB, CKMBINDEX, TROPONINI in the last 168 hours. BNP (last 3 results) No results for input(s): PROBNP in the last 8760 hours. HbA1C: No results for input(s): HGBA1C in the last 72 hours. CBG: Recent Labs  Lab 04/25/18 1908  GLUCAP 92   Lipid Profile: No results for input(s): CHOL, HDL, LDLCALC, TRIG, CHOLHDL, LDLDIRECT in the last 72 hours. Thyroid Function Tests: Recent Labs    04/27/18 1240  TSH 1.433   Anemia Panel: Recent Labs    04/27/18 1240  VITAMINB12 562   Urine analysis:    Component Value Date/Time   COLORURINE STRAW (A) 04/25/2018 2128   APPEARANCEUR CLEAR 04/25/2018 2128   LABSPEC 1.014 04/25/2018 2128   PHURINE 6.0 04/25/2018 2128   GLUCOSEU NEGATIVE 04/25/2018 2128   HGBUR NEGATIVE 04/25/2018 2128   BILIRUBINUR NEGATIVE 04/25/2018 2128   KETONESUR NEGATIVE 04/25/2018 2128   PROTEINUR NEGATIVE 04/25/2018 2128   UROBILINOGEN 0.2 12/22/2013 0956   NITRITE NEGATIVE 04/25/2018 2128   LEUKOCYTESUR NEGATIVE 04/25/2018 2128   Sepsis Labs: @LABRCNTIP (procalcitonin:4,lacticidven:4) ) Recent Results (from the past 240 hour(s))  Urine culture     Status: None   Collection Time: 04/25/18  9:28 PM  Result Value Ref Range  Status   Specimen Description URINE, RANDOM  Final   Special Requests NONE  Final   Culture   Final    NO GROWTH Performed at [facility], [place] [address] [place], [place] [address]    Report Status 04/27/2018 FINAL  Final  Respiratory Panel by PCR     Status: None   Collection Time: 04/26/18  2:52 AM  Result Value Ref Range Status   Adenovirus NOT DETECTED NOT DETECTED Final   Coronavirus 229E NOT DETECTED NOT DETECTED Final   Coronavirus HKU1 NOT DETECTED NOT DETECTED Final   Coronavirus NL63 NOT DETECTED NOT DETECTED Final   Coronavirus OC43 NOT DETECTED NOT DETECTED Final   Metapneumovirus NOT DETECTED NOT DETECTED Final   Rhinovirus / Enterovirus NOT DETECTED NOT DETECTED Final   Influenza A NOT DETECTED NOT DETECTED Final   Influenza B NOT DETECTED NOT DETECTED Final   Parainfluenza Virus 1 NOT DETECTED NOT DETECTED Final   Parainfluenza Virus 2 NOT DETECTED NOT DETECTED Final   Parainfluenza Virus 3 NOT DETECTED NOT DETECTED Final   Parainfluenza Virus 4 NOT DETECTED NOT DETECTED Final   Respiratory Syncytial Virus NOT DETECTED NOT DETECTED Final   Bordetella pertussis NOT DETECTED NOT DETECTED Final   Chlamydophila pneumoniae NOT DETECTED NOT DETECTED Final   Mycoplasma pneumoniae NOT DETECTED NOT DETECTED Final    Comment: Performed at [facility], [place] [address], [place], [place] [address]  Culture, blood (routine x 2)     Status: None (Preliminary result)   Collection Time: 04/26/18  4:00 AM  Result Value Ref Range Status   Specimen Description BLOOD LEFT HAND  Final   Special Requests   Final    BOTTLES DRAWN AEROBIC AND ANAEROBIC Blood Culture adequate volume Performed at [facility], [place] [address] [place], [place] [address]  Culture NO GROWTH 1 DAY  Final   Report Status PENDING  Incomplete  Culture, blood (routine x 2)     Status: None (Preliminary result)   Collection Time: 04/26/18  4:20 AM  Result Value Ref Range Status   Specimen  Description BLOOD RIGHT HAND  Final   Special Requests   Final    BOTTLES DRAWN AEROBIC ONLY Blood Culture adequate volume Performed at [facility], [address] [place], [place] [address]    Culture NO GROWTH 1 DAY  Final   Report Status PENDING  Incomplete         Radiology Studies: Ct Head Wo Contrast  Result Date: 04/25/2018 CLINICAL DATA:  Patient was on a walk-in fell hitting his head. History of prior stroke with speech deficits. EXAM: CT HEAD WITHOUT CONTRAST TECHNIQUE: Contiguous axial images were obtained from the base of the skull through the vertex without intravenous contrast. COMPARISON:  11/16/2017 FINDINGS: Brain: Right frontal and left temporoparietal lobe encephalomalacia from remote infarcts. Chronic moderate small vessel ischemia. Superficial and central atrophy is redemonstrated without significant change. Midline fourth ventricle and basal cisterns without effacement. Brainstem and cerebellum appear nonacute. Vascular: Atherosclerosis at the skull base. No hyperdense vessel sign. Skull: Right frontal craniotomy defect. No acute osseous abnormality. Sinuses/Orbits: Moderate ethmoid and mild sphenoid sinus mucosal thickening. Intact orbits and globes. Bilateral cataract surgery. Other: None. IMPRESSION: 1. No acute intracranial abnormality. 2. Right frontal and left temporoparietal lobe encephalomalacia from remote infarcts. 3. Chronic moderate small vessel ischemia. 4. Moderate ethmoid and mild sphenoid sinus mucosal thickening. Electronically Signed   By: Ashley Royalty M.D.   On: 04/25/2018 20:56   Ct Chest W Contrast  Result Date: 04/25/2018 CLINICAL DATA:  Dizziness, fall EXAM: CT CHEST, ABDOMEN, AND PELVIS WITH CONTRAST TECHNIQUE: Multidetector CT imaging of the chest, abdomen and pelvis was performed following the standard protocol during bolus administration of intravenous contrast. CONTRAST:  3mL OMNIPAQUE IOHEXOL 300 MG/ML  SOLN COMPARISON:  CT  abdomen/pelvis dated 01/17/2017 FINDINGS: CT CHEST FINDINGS Cardiovascular: No evidence of traumatic aortic injury. No evidence of thoracic aortic aneurysm or dissection. Atherosclerotic calcifications of the aortic arch. The heart is normal in size.  No pericardial effusion. Mild coronary atherosclerosis of the LAD and right coronary artery. Mediastinum/Nodes: No evidence of mediastinal hematoma. No suspicious mediastinal lymphadenopathy. Visualized thyroid is grossly unremarkable. Lungs/Pleura: Biapical pleural-parenchymal scarring. Mild centrilobular and paraseptal emphysematous changes, upper lobe predominant. Mild subpleural reticulation/dependent atelectasis in the bilateral lower lobes. Evaluation of the lung parenchyma is constrained by respiratory motion. Within that constraint, there are no suspicious pulmonary nodules. No focal consolidation. No pleural effusion or pneumothorax. Musculoskeletal: Degenerative changes of the thoracic spine. No fracture is seen. CT ABDOMEN PELVIS FINDINGS Hepatobiliary: 9 mm probable cyst in the left hepatic dome (series 3/image 46). 12 mm probable cyst inferiorly in the right hepatic lobe (series 3/image 62). Gallbladder is unremarkable. No intrahepatic or extrahepatic ductal dilatation. Pancreas: Within normal limits. Spleen: Within normal limits. Adrenals/Urinary Tract: Adrenal glands are within normal limits. 2.0 cm hyperdense/hemorrhagic cyst along the medial left upper kidney (series 3/image 87), unchanged. Bilateral renal cysts, simple. Cortical scarring along the posterior right lower kidney. 2 mm nonobstructing right lower pole renal calculus (series 3/image 39). No hydronephrosis. Bladder is within normal limits. Stomach/Bowel: Stomach is notable for a tiny hiatal hernia. No evidence of bowel obstruction. Normal appendix (series 3/image 108). Left colonic diverticulosis, without evidence of diverticulitis. Vascular/Lymphatic: No evidence of abdominal aortic  aneurysm. Atherosclerotic  calcifications of the abdominal aorta and branch vessels. No suspicious abdominopelvic lymphadenopathy. Status George pelvic lymph node dissection. Reproductive: Status George prostatectomy. Other: No abdominopelvic ascites. No hemoperitoneum or free air. Musculoskeletal: Mild degenerative changes of the lumbar spine. No fracture is seen. IMPRESSION: No evidence of traumatic injury to the chest, abdomen, or pelvis. Status George prostatectomy. No evidence of recurrent or metastatic disease. 2 cm hyperdense/hemorrhagic cyst along the medial left upper kidney, benign (Bosniak II). Additional stable ancillary findings as above. Electronically Signed   By: Julian Hy M.D.   On: 04/25/2018 20:58   Ct Cervical Spine Wo Contrast  Result Date: 04/25/2018 CLINICAL DATA:  Neck pain after fall EXAM: CT CERVICAL SPINE WITHOUT CONTRAST TECHNIQUE: Multidetector CT imaging of the cervical spine was performed without intravenous contrast. Multiplanar CT image reconstructions were also generated. COMPARISON:  None. FINDINGS: Alignment: Maintained cervical lordosis. Skull base and vertebrae: Intact skull base. Subcortical degenerative cystic change of the odontoid process is noted. Partial ankylosis across the C2 through C5 interspaces. Soft tissues and spinal canal: No prevertebral fluid or swelling. No visible canal hematoma. Disc levels: Cervical spondylosis with moderate-to-marked disc flattening from C2 through C7 as well as moderate disc flattening C7-T1 and mild from T1 through T4. Facet ankylosis is noted C2-C4 on the left. Bilateral facet arthrosis with facet joint space narrowing and hypertrophy is seen. Uncovertebral joint osteoarthritis is noted bilaterally from C3 through C7. Upper chest: Emphysematous changes at the apices both lungs. Aortic atherosclerosis of the included aortic arch is identified. 5 mm nodular density in the left upper lobe. Other: Extracranial carotid arteriosclerosis  is seen bilaterally. IMPRESSION: 1. Cervical spondylosis without acute fracture or posttraumatic subluxation. 2. Emphysema. 3. 5 mm left upper lobe pulmonary nodule. No follow-up needed if patient is low-risk. Non-contrast chest CT can be considered in 12 months if patient is high-risk. This recommendation follows the consensus statement: Guidelines for Management of Incidental Pulmonary Nodules Detected on CT Images: From the Fleischner Society 2017; Radiology 2017; 284:228-243. Aortic Atherosclerosis (ICD10-I70.0) and Emphysema (ICD10-J43.9). Electronically Signed   By: Ashley Royalty M.D.   On: 04/25/2018 21:00   Ct Abdomen Pelvis W Contrast  Result Date: 04/25/2018 CLINICAL DATA:  Dizziness, fall EXAM: CT CHEST, ABDOMEN, AND PELVIS WITH CONTRAST TECHNIQUE: Multidetector CT imaging of the chest, abdomen and pelvis was performed following the standard protocol during bolus administration of intravenous contrast. CONTRAST:  93mL OMNIPAQUE IOHEXOL 300 MG/ML  SOLN COMPARISON:  CT abdomen/pelvis dated 01/17/2017 FINDINGS: CT CHEST FINDINGS Cardiovascular: No evidence of traumatic aortic injury. No evidence of thoracic aortic aneurysm or dissection. Atherosclerotic calcifications of the aortic arch. The heart is normal in size.  No pericardial effusion. Mild coronary atherosclerosis of the LAD and right coronary artery. Mediastinum/Nodes: No evidence of mediastinal hematoma. No suspicious mediastinal lymphadenopathy. Visualized thyroid is grossly unremarkable. Lungs/Pleura: Biapical pleural-parenchymal scarring. Mild centrilobular and paraseptal emphysematous changes, upper lobe predominant. Mild subpleural reticulation/dependent atelectasis in the bilateral lower lobes. Evaluation of the lung parenchyma is constrained by respiratory motion. Within that constraint, there are no suspicious pulmonary nodules. No focal consolidation. No pleural effusion or pneumothorax. Musculoskeletal: Degenerative changes of the  thoracic spine. No fracture is seen. CT ABDOMEN PELVIS FINDINGS Hepatobiliary: 9 mm probable cyst in the left hepatic dome (series 3/image 46). 12 mm probable cyst inferiorly in the right hepatic lobe (series 3/image 62). Gallbladder is unremarkable. No intrahepatic or extrahepatic ductal dilatation. Pancreas: Within normal limits. Spleen: Within normal limits. Adrenals/Urinary Tract: Adrenal glands are within normal  limits. 2.0 cm hyperdense/hemorrhagic cyst along the medial left upper kidney (series 3/image 87), unchanged. Bilateral renal cysts, simple. Cortical scarring along the posterior right lower kidney. 2 mm nonobstructing right lower pole renal calculus (series 3/image 39). No hydronephrosis. Bladder is within normal limits. Stomach/Bowel: Stomach is notable for a tiny hiatal hernia. No evidence of bowel obstruction. Normal appendix (series 3/image 108). Left colonic diverticulosis, without evidence of diverticulitis. Vascular/Lymphatic: No evidence of abdominal aortic aneurysm. Atherosclerotic calcifications of the abdominal aorta and branch vessels. No suspicious abdominopelvic lymphadenopathy. Status George pelvic lymph node dissection. Reproductive: Status George prostatectomy. Other: No abdominopelvic ascites. No hemoperitoneum or free air. Musculoskeletal: Mild degenerative changes of the lumbar spine. No fracture is seen. IMPRESSION: No evidence of traumatic injury to the chest, abdomen, or pelvis. Status George prostatectomy. No evidence of recurrent or metastatic disease. 2 cm hyperdense/hemorrhagic cyst along the medial left upper kidney, benign (Bosniak II). Additional stable ancillary findings as above. Electronically Signed   By: Julian Hy M.D.   On: 04/25/2018 20:58   Dg Chest Port 1 View  Result Date: 04/26/2018 CLINICAL DATA:  Fever EXAM: PORTABLE CHEST 1 VIEW COMPARISON:  04/13/2018 FINDINGS: Normal heart size. Low lung volumes. Bibasilar atelectasis versus airspace disease left  greater than right. No pneumothorax or pleural effusion. IMPRESSION: Bibasilar atelectasis versus airspace disease left greater than right Electronically Signed   By: Marybelle Killings M.D.   On: 04/26/2018 09:46   Dg Hand Complete Right  Result Date: 04/25/2018 CLINICAL DATA:  Recent fall with right hand pain, initial encounter EXAM: RIGHT HAND - COMPLETE 3+ VIEW COMPARISON:  None. FINDINGS: Mild degenerative changes are noted within the carpal bones. Some mild interphalangeal degenerative changes are noted as well. No acute fracture or dislocation is identified. IMPRESSION: Mild degenerative change without acute bony abnormality. Electronically Signed   By: Inez Catalina M.D.   On: 04/25/2018 20:45   Ct Maxillofacial Wo Contrast  Result Date: 04/25/2018 CLINICAL DATA:  Blunt trauma status George fall. EXAM: CT MAXILLOFACIAL WITHOUT CONTRAST TECHNIQUE: Multidetector CT imaging of the maxillofacial structures was performed. Multiplanar CT image reconstructions were also generated. COMPARISON:  11/16/2017 head CT FINDINGS: Osseous: No fracture or mandibular dislocation. Degenerative subcortical cystic change of the odontoid process. Slight asymmetry across the Lantus dental likely degenerative in etiology. No acute fracture Orbits: Negative. No traumatic or inflammatory finding. Bilateral cataract extractions. Sinuses: Moderate ethmoid and bilateral maxillary and sphenoid sinus mucosal thickening is seen. Air-fluid levels Soft tissues: Right premalar soft tissue swelling and contusion. Limited intracranial: Right frontal and left temporoparietal encephalomalacia from remote infarcts. IMPRESSION: 1. No evidence of acute maxillofacial fracture. 2. Right premalar soft tissue swelling and contusion. 3. Moderate paranasal sinus disease. 4. Remote infarcts right frontal and left temporoparietal distribution. Electronically Signed   By: Ashley Royalty M.D.   On: 04/25/2018 21:07      Scheduled Meds: . atorvastatin  40  mg Oral q1800  . carvedilol  3.125 mg Oral BID WC  . enoxaparin (LOVENOX) injection  40 mg Subcutaneous Daily  . irbesartan  150 mg Oral QPM  . neomycin-bacitracin-polymyxin   Topical Daily  . pantoprazole  40 mg Oral Daily  . polyethylene glycol  17 g Oral Q M,W,F  . sertraline  100 mg Oral Daily   Continuous Infusions: . ceFEPime (MAXIPIME) IV 1 g (04/27/18 1303)     LOS: 1 day    Time spent in minutes: [place], MD [facility] Pager: [email or web address] Password  [email or web address] 04/27/2018, 2:10 PM

## 2018-04-28 ENCOUNTER — Encounter (HOSPITAL_COMMUNITY): Payer: Self-pay | Admitting: *Deleted

## 2018-04-28 ENCOUNTER — Inpatient Hospital Stay (HOSPITAL_COMMUNITY)
Admission: RE | Admit: 2018-04-28 | Discharge: 2018-05-05 | DRG: 945 | Disposition: A | Discharge status: Home / Self Care | Payer: Medicare Other | Source: Intra-hospital | Attending: Physical Medicine & Rehabilitation | Admitting: Physical Medicine & Rehabilitation

## 2018-04-28 ENCOUNTER — Inpatient Hospital Stay (HOSPITAL_COMMUNITY): Payer: Medicare Other

## 2018-04-28 ENCOUNTER — Other Ambulatory Visit: Payer: Self-pay

## 2018-04-28 DIAGNOSIS — R7303 Prediabetes: Secondary | ICD-10-CM

## 2018-04-28 DIAGNOSIS — R4701 Aphasia: Secondary | ICD-10-CM | POA: Diagnosis present

## 2018-04-28 DIAGNOSIS — R29898 Other symptoms and signs involving the musculoskeletal system: Secondary | ICD-10-CM

## 2018-04-28 DIAGNOSIS — Z85828 Personal history of other malignant neoplasm of skin: Secondary | ICD-10-CM

## 2018-04-28 DIAGNOSIS — Z87891 Personal history of nicotine dependence: Secondary | ICD-10-CM

## 2018-04-28 DIAGNOSIS — Z8546 Personal history of malignant neoplasm of prostate: Secondary | ICD-10-CM | POA: Diagnosis not present

## 2018-04-28 DIAGNOSIS — N401 Enlarged prostate with lower urinary tract symptoms: Secondary | ICD-10-CM | POA: Diagnosis present

## 2018-04-28 DIAGNOSIS — E785 Hyperlipidemia, unspecified: Secondary | ICD-10-CM | POA: Diagnosis present

## 2018-04-28 DIAGNOSIS — R2689 Other abnormalities of gait and mobility: Secondary | ICD-10-CM

## 2018-04-28 DIAGNOSIS — K5909 Other constipation: Secondary | ICD-10-CM | POA: Diagnosis present

## 2018-04-28 DIAGNOSIS — I10 Essential (primary) hypertension: Secondary | ICD-10-CM | POA: Diagnosis not present

## 2018-04-28 DIAGNOSIS — R338 Other retention of urine: Secondary | ICD-10-CM | POA: Diagnosis present

## 2018-04-28 DIAGNOSIS — J13 Pneumonia due to Streptococcus pneumoniae: Secondary | ICD-10-CM

## 2018-04-28 DIAGNOSIS — R5081 Fever presenting with conditions classified elsewhere: Secondary | ICD-10-CM

## 2018-04-28 DIAGNOSIS — Z87820 Personal history of traumatic brain injury: Secondary | ICD-10-CM | POA: Diagnosis not present

## 2018-04-28 DIAGNOSIS — R269 Unspecified abnormalities of gait and mobility: Secondary | ICD-10-CM

## 2018-04-28 DIAGNOSIS — Z9079 Acquired absence of other genital organ(s): Secondary | ICD-10-CM

## 2018-04-28 DIAGNOSIS — N390 Urinary tract infection, site not specified: Secondary | ICD-10-CM | POA: Diagnosis not present

## 2018-04-28 DIAGNOSIS — R5381 Other malaise: Principal | ICD-10-CM | POA: Diagnosis present

## 2018-04-28 DIAGNOSIS — Z79899 Other long term (current) drug therapy: Secondary | ICD-10-CM | POA: Diagnosis not present

## 2018-04-28 DIAGNOSIS — F329 Major depressive disorder, single episode, unspecified: Secondary | ICD-10-CM | POA: Diagnosis present

## 2018-04-28 DIAGNOSIS — J690 Pneumonitis due to inhalation of food and vomit: Secondary | ICD-10-CM | POA: Diagnosis not present

## 2018-04-28 DIAGNOSIS — T07XXXA Unspecified multiple injuries, initial encounter: Secondary | ICD-10-CM

## 2018-04-28 DIAGNOSIS — Z86011 Personal history of benign neoplasm of the brain: Secondary | ICD-10-CM

## 2018-04-28 DIAGNOSIS — R339 Retention of urine, unspecified: Secondary | ICD-10-CM | POA: Diagnosis not present

## 2018-04-28 DIAGNOSIS — M1712 Unilateral primary osteoarthritis, left knee: Secondary | ICD-10-CM | POA: Diagnosis not present

## 2018-04-28 DIAGNOSIS — A419 Sepsis, unspecified organism: Secondary | ICD-10-CM | POA: Diagnosis not present

## 2018-04-28 DIAGNOSIS — R195 Other fecal abnormalities: Secondary | ICD-10-CM | POA: Diagnosis not present

## 2018-04-28 LAB — CBC
HCT: 41.3 % (ref 39.0–52.0)
Hemoglobin: 13.5 g/dL (ref 13.0–17.0)
MCH: 28.5 pg (ref 26.0–34.0)
MCHC: 32.7 g/dL (ref 30.0–36.0)
MCV: 87.1 fL (ref 80.0–100.0)
Platelets: 192 10*3/uL (ref 150–400)
RBC: 4.74 MIL/uL (ref 4.22–5.81)
RDW: 13 % (ref 11.5–15.5)
WBC: 10.7 10*3/uL — ABNORMAL HIGH (ref 4.0–10.5)
nRBC: 0 % (ref 0.0–0.2)

## 2018-04-28 LAB — RPR: RPR Ser Ql: NONREACTIVE

## 2018-04-28 LAB — BASIC METABOLIC PANEL
Anion gap: 8 (ref 5–15)
BUN: 18 mg/dL (ref 8–23)
CO2: 23 mmol/L (ref 22–32)
Calcium: 9 mg/dL (ref 8.9–10.3)
Chloride: 110 mmol/L (ref 98–111)
Creatinine, Ser: 0.96 mg/dL (ref 0.61–1.24)
GFR calc Af Amer: >60 mL/min (ref >60)
GFR calc non Af Amer: >60 mL/min (ref >60)
Glucose, Bld: 123 mg/dL — ABNORMAL HIGH (ref 70–99)
Potassium: 4.1 mmol/L (ref 3.5–5.1)
Sodium: 141 mmol/L (ref 135–145)

## 2018-04-28 LAB — HEMOGLOBIN A1C
Hgb A1c MFr Bld: 5.8 % — ABNORMAL HIGH (ref 4.8–5.6)
Mean Plasma Glucose: 120 mg/dL

## 2018-04-28 MED ORDER — SERTRALINE HCL 100 MG PO TABS
100.0000 mg | ORAL_TABLET | Freq: Every day | ORAL | Status: DC
  Administered 2018-04-29 – 2018-05-05 (×7): 100 mg via ORAL
  Filled 2018-04-28 (×7): qty 1

## 2018-04-28 MED ORDER — CARVEDILOL 3.125 MG PO TABS
3.1250 mg | ORAL_TABLET | Freq: Two times a day (BID) | ORAL | Status: DC
  Administered 2018-04-29 – 2018-05-05 (×13): 3.125 mg via ORAL
  Filled 2018-04-28 (×13): qty 1

## 2018-04-28 MED ORDER — SODIUM CHLORIDE 0.9 % IV SOLN
1.0000 g | Freq: Three times a day (TID) | INTRAVENOUS | Status: AC
  Administered 2018-04-29 – 2018-05-01 (×9): 1 g via INTRAVENOUS
  Filled 2018-04-28 (×9): qty 1

## 2018-04-28 MED ORDER — ENOXAPARIN SODIUM 40 MG/0.4ML ~~LOC~~ SOLN: 40.0000 mg | SUBCUTANEOUS | Status: DC

## 2018-04-28 MED ORDER — ATORVASTATIN CALCIUM 40 MG PO TABS
40.0000 mg | ORAL_TABLET | Freq: Every day | ORAL | Status: DC
  Administered 2018-04-29 – 2018-05-04 (×6): 40 mg via ORAL
  Filled 2018-04-28 (×6): qty 1

## 2018-04-28 MED ORDER — ACETAMINOPHEN 325 MG PO TABS
650.0000 mg | ORAL_TABLET | Freq: Four times a day (QID) | ORAL | Status: DC | PRN
  Administered 2018-05-02 – 2018-05-05 (×4): 650 mg via ORAL
  Filled 2018-04-28 (×4): qty 2

## 2018-04-28 MED ORDER — POLYETHYLENE GLYCOL 3350 17 G PO PACK: 17.0000 g | PACK | ORAL | 0 refills | Status: AC

## 2018-04-28 MED ORDER — ENOXAPARIN SODIUM 40 MG/0.4ML ~~LOC~~ SOLN
40.0000 mg | Freq: Every day | SUBCUTANEOUS | Status: DC
  Administered 2018-04-29 – 2018-05-04 (×6): 40 mg via SUBCUTANEOUS
  Filled 2018-04-28 (×7): qty 0.4

## 2018-04-28 MED ORDER — SODIUM CHLORIDE 0.9 % IV SOLN: 1.0000 g | Freq: Three times a day (TID) | INTRAVENOUS | Status: DC

## 2018-04-28 MED ORDER — PANTOPRAZOLE SODIUM 40 MG PO TBEC
40.0000 mg | DELAYED_RELEASE_TABLET | Freq: Every day | ORAL | Status: DC
  Administered 2018-04-29 – 2018-05-05 (×7): 40 mg via ORAL
  Filled 2018-04-28 (×7): qty 1

## 2018-04-28 MED ORDER — POLYETHYLENE GLYCOL 3350 17 G PO PACK
17.0000 g | PACK | ORAL | Status: DC
  Administered 2018-04-29: 17 g via ORAL
  Filled 2018-04-28: qty 1

## 2018-04-28 MED ORDER — BACITRACIN-NEOMYCIN-POLYMYXIN OINTMENT TUBE
TOPICAL_OINTMENT | Freq: Every day | CUTANEOUS | Status: DC
  Administered 2018-04-30: 08:00:00 via TOPICAL
  Filled 2018-04-28: qty 14

## 2018-04-28 MED ORDER — ACETAMINOPHEN 650 MG RE SUPP: 650.0000 mg | Freq: Four times a day (QID) | RECTAL | Status: DC | PRN

## 2018-04-28 MED ORDER — SORBITOL 70 % SOLN: 30.0000 mL | Freq: Every day | Status: DC | PRN

## 2018-04-28 MED ORDER — IRBESARTAN 75 MG PO TABS
150.0000 mg | ORAL_TABLET | Freq: Every evening | ORAL | Status: DC
  Administered 2018-04-29 – 2018-05-01 (×3): 150 mg via ORAL
  Filled 2018-04-28 (×3): qty 2
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
## 2018-04-28 NOTE — Progress Notes (Signed)
PT Cancellation Note  Patient Details Name: Eric George MRN: [id number] DOB: [birth year]   Cancelled Treatment:    Reason Eval/Treat Not Completed: Patient at procedure or test/unavailable. Pt having bedside EEG performed at this time. PT will continue to follow acutely as available.    [place] 04/28/2018, 10:11 AM

## 2018-04-28 NOTE — Progress Notes (Signed)
Patient ID: Eric George, male   DOB: 09/26/1930, 84 y.o.   MRN: [id number] Patient arrived from 3W38 with patient belongings, family, and RN. Patient and family oriented to room, fall prevention plan, rehab safety plan, rehab schedule, safety belt, and health resource notebook with verbal understanding.

## 2018-04-28 NOTE — Care Management Note (Signed)
Case Management Note  Patient Details  Name: Eric George MRN: [id number] Date of Birth: 1930/09/05  Subjective/Objective:                    Action/Plan: Pt discharging to CIR today. CM signing off.   Expected Discharge Date:  04/28/18               Expected Discharge Plan:  [place]  In-House Referral:     Discharge planning Services  CM Consult  Post Acute Care Choice:    Choice offered to:     DME Arranged:    DME Agency:     HH Arranged:    HH Agency:     Status of Service:  Completed, signed off  If discussed at [employer], dates discussed:    Additional Comments:  Pollie Friar, RN 04/28/2018, 4:27 PM

## 2018-04-28 NOTE — Plan of Care (Signed)
Patient stable, discussed POC with patient and spouse, agreeable with plan, denies question/concerns at this time.  
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
## 2018-04-28 NOTE — Procedures (Signed)
History: 84 yo M with falls  Sedation: none  Technique: This is a 21 channel routine scalp EEG performed at the bedside with bipolar and monopolar montages arranged in accordance to the international 10/20 system of electrode placement. One channel was dedicated to EKG recording.    Background: The background consists of intermixed alpha and beta activities. There is a well defined posterior dominant rhythm of 9 Hz that attenuates with eye opening. Sleep is recorded with normal appearing structures.   Photic stimulation: Physiologic driving is not performed  EEG Abnormalities: None  Clinical Interpretation: This normal EEG is recorded in the waking and sleep state. There was no seizure or seizure predisposition recorded on this study. Please note that lack of epileptiform activity on EEG does not preclude the possibility of epilepsy.   Roland Rack, MD [facility] [phone]  If 7pm- 7am, please page neurology on call as listed in [place].

## 2018-04-28 NOTE — Discharge Summary (Signed)
Physician Discharge Summary  Eric George:[id number] DOB: [birth year] DOA: 04/25/2018  PCP: Eulas Post, MD  Admit date: 04/25/2018 Discharge date: 04/28/2018  Admitted From: home Disposition:  CIR      Discharge Condition:  stable   CODE STATUS:  Full code   Diet recommendation:  Heart healthy Consultations:  Neurology   Discharge Diagnoses:  Principal Problem:   Fever, cough- likely pneumonia Active Problems:   Fall    H/o TBI   Acute encephalopathy   Essential hypertension     Brief Summary: Eric George is a 84 y.o.male who has short term memory loss from a TBI about 30 yrs ago, h/o prostate CA s/p prostatectomy, GERD and HTN. On the day of admission, hefell on the pavement while out for a walk and hit his face and right arm.  In ED> unable to urinate- bladder scan done showing 1 L - foley placed with approximately 1 L return. UA neg. Had new onset confusion. ED spoke with Neuro over the phone who recommended an MRI for his confusion- wife did not feel comfortable taking him home with the level of confusion. Overnight> fever 101- started on Cefepime, Acylcovir, Flagyl and Vanc by admitting doctor.    Hospital Course:  Fever 101, cough -  UA was negative was recently treated for a pneumonia with 7 days of Levaquin by his PCP which finished last Thursday. Has had a recurrence of cough since then.   - CXR  > bibasilar atelectasis vs air space disease -  With ongoing cough, I decided to this as a pneumonia having failed Levaquin- I discontinued Vanc, Acyclovir and Flagyl but continued Cefepime  - blood cultures negative - resp virus panel negative - no recurrence of fever- cough improving- would recommend 3 more days of Cefepime to complete a 7 day course    Active Problems:  Acute encephalopathy- h/o TBI with baseline cognitive deficits and trouble with memory - ammonia normal, vit b12 normal, TSH normal, CT head normal- neuro consulted - by  day 2 his wife noted he was better as he was able to recognize her but was not back to his baseline - 12/ 25 MRI unrevealing - see below - 12/26 EEG normal - per wife, he is steadily improving and is more like his baseline now - I suspect the mental status changes may be due to underlying infection superimposed by a possible concussion by falling and striking his head on the pavement    Urinary retention - h/o prostatectomy/ prostate CA - seen in ED on 12/15 for "decreased urine output", bladder scan did not show any urine- foley placed and no significant urine output noted- began to urinate after being given IVF - based on above findings in the ED, we have removed the foley and did a voiding trial- he was able to urinate without difficult and had no post void residual  Fall - fell on side walk when he went on his BID walk - per wife, he shuffles his feet and has PT at home 2 x a week to prevent falls - obtained PT eval- Pt recommended CIR whom I have consulted- he is being transferred to CIR today  Abrasions to nose/ lips from fall - healing  HTN - cont Avapro and Coreg    Discharge Exam: Vitals:   04/28/18 0803 04/28/18 1125  BP: 111/74 131/87  Pulse: (!) 48 92  Resp: 18 20  Temp: 98.2 F (36.8 C) 98.9 F (37.2 C)  SpO2: 96% 97%   Vitals:   04/28/18 0107 04/28/18 0335 04/28/18 0803 04/28/18 1125  BP: (!) 158/97 136/85 111/74 131/87  Pulse: 87 76 (!) 48 92  Resp:  20 18 20   Temp:  97.9 F (36.6 C) 98.2 F (36.8 C) 98.9 F (37.2 C)  TempSrc:  Oral Oral Oral  SpO2:  96% 96% 97%  Weight:        General: Pt is alert, awake, not in acute distress Cardiovascular: RRR, S1/S2 +, no rubs, no gallops Respiratory: CTA bilaterally, no wheezing, no rhonchi Abdominal: Soft, NT, ND, bowel sounds + Extremities: no edema, no cyanosis   Discharge Instructions  Discharge Instructions    Diet - low sodium heart healthy   Complete by:  As directed    Increase activity  slowly   Complete by:  As directed      Allergies as of 04/28/2018      Reactions   Penicillins Rash   Has patient had a PCN reaction causing immediate rash, facial/tongue/throat swelling, SOB or lightheadedness with hypotension: Yes Has patient had a PCN reaction causing severe rash involving mucus membranes or skin necrosis: No Has patient had a PCN reaction that required hospitalization: No Has patient had a PCN reaction occurring within the last 10 years: No If all of the above answers are "NO", then may proceed with Cephalosporin use.      Medication List    STOP taking these medications   levofloxacin 500 MG tablet Commonly known as:  LEVAQUIN     TAKE these medications   acetaminophen 325 MG tablet Commonly known as:  TYLENOL Take 325-650 mg by mouth every 6 (six) hours as needed for mild pain.   acetic acid-hydrocortisone OTIC solution Commonly known as:  VOSOL-HC Place 5 drops into both ears every 30 (thirty) days.   ALIGN 4 MG Caps Take 1 capsule by mouth daily.   aspirin 325 MG tablet Take 1 tablet (325 mg total) by mouth daily.   atorvastatin 40 MG tablet Commonly known as:  LIPITOR TAKE 1 TABLET DAILY AT 6PM What changed:  See the new instructions.   carvedilol 3.125 MG tablet Commonly known as:  COREG TAKE 1 TABLET TWICE A DAY  WITH MEALS   ceFEPIme 1 g in sodium chloride 0.9 % 100 mL Inject 1 g into the vein every 8 (eight) hours.   irbesartan 150 MG tablet Commonly known as:  AVAPRO Take 1 tablet (150 mg total) by mouth daily. What changed:  when to take this   MACULAR VITAMIN BENEFIT Tabs Take 1 capsule by mouth daily.   omeprazole 40 MG capsule Commonly known as:  PRILOSEC Take 40 mg by mouth daily.   polyethylene glycol packet Commonly known as:  MIRALAX / GLYCOLAX Take 17 g by mouth every Monday, Wednesday, and Friday. Start taking on:  April 29, 2018   sertraline 100 MG tablet Commonly known as:  ZOLOFT TAKE 1 TABLET DAILY.        (PLEASE SCHEDULE A PHYSICALFOR MORE REFILLS. THANKS.) What changed:  See the new instructions.       Allergies  Allergen Reactions  . Penicillins Rash    Has patient had a PCN reaction causing immediate rash, facial/tongue/throat swelling, SOB or lightheadedness with hypotension: Yes Has patient had a PCN reaction causing severe rash involving mucus membranes or skin necrosis: No Has patient had a PCN reaction that required hospitalization: No Has patient had a PCN reaction occurring within the last 10 years:  No If all of the above answers are "NO", then may proceed with Cephalosporin use.     Procedures/Studies:  Foley  Dg Chest 2 View  Result Date: 04/14/2018 CLINICAL DATA:  chronic cough for several months. EXAM: CHEST - 2 VIEW COMPARISON:  CT abdomen 01/17/2018. Chest x-ray 01/17/2017, 06/12/2015 FINDINGS: Mediastinum and hilar structures are normal. Mild basilar infiltrate, best seen on lateral view. Follow-up chest x-rays to demonstrate clearing suggested. No pleural effusion or pneumothorax. Heart size normal. Degenerative change thoracic spine. No acute bony abnormality identified. IMPRESSION: Mild basilar infiltrate best seen on lateral view. Followup PA and lateral chest X-ray is recommended in 3-4 weeks following trial of antibiotic therapy to ensure resolution and exclude underlying malignancy. Electronically Signed   By: Marcello Moores  Register   On: 04/14/2018 08:06   Ct Head Wo Contrast  Result Date: 04/25/2018 CLINICAL DATA:  Patient was on a walk-in fell hitting his head. History of prior stroke with speech deficits. EXAM: CT HEAD WITHOUT CONTRAST TECHNIQUE: Contiguous axial images were obtained from the base of the skull through the vertex without intravenous contrast. COMPARISON:  11/16/2017 FINDINGS: Brain: Right frontal and left temporoparietal lobe encephalomalacia from remote infarcts. Chronic moderate small vessel ischemia. Superficial and central atrophy is  redemonstrated without significant change. Midline fourth ventricle and basal cisterns without effacement. Brainstem and cerebellum appear nonacute. Vascular: Atherosclerosis at the skull base. No hyperdense vessel sign. Skull: Right frontal craniotomy defect. No acute osseous abnormality. Sinuses/Orbits: Moderate ethmoid and mild sphenoid sinus mucosal thickening. Intact orbits and globes. Bilateral cataract surgery. Other: None. IMPRESSION: 1. No acute intracranial abnormality. 2. Right frontal and left temporoparietal lobe encephalomalacia from remote infarcts. 3. Chronic moderate small vessel ischemia. 4. Moderate ethmoid and mild sphenoid sinus mucosal thickening. Electronically Signed   By: Ashley Royalty M.D.   On: 04/25/2018 20:56   Ct Chest W Contrast  Result Date: 04/25/2018 CLINICAL DATA:  Dizziness, fall EXAM: CT CHEST, ABDOMEN, AND PELVIS WITH CONTRAST TECHNIQUE: Multidetector CT imaging of the chest, abdomen and pelvis was performed following the standard protocol during bolus administration of intravenous contrast. CONTRAST:  31mL OMNIPAQUE IOHEXOL 300 MG/ML  SOLN COMPARISON:  CT abdomen/pelvis dated 01/17/2017 FINDINGS: CT CHEST FINDINGS Cardiovascular: No evidence of traumatic aortic injury. No evidence of thoracic aortic aneurysm or dissection. Atherosclerotic calcifications of the aortic arch. The heart is normal in size.  No pericardial effusion. Mild coronary atherosclerosis of the LAD and right coronary artery. Mediastinum/Nodes: No evidence of mediastinal hematoma. No suspicious mediastinal lymphadenopathy. Visualized thyroid is grossly unremarkable. Lungs/Pleura: Biapical pleural-parenchymal scarring. Mild centrilobular and paraseptal emphysematous changes, upper lobe predominant. Mild subpleural reticulation/dependent atelectasis in the bilateral lower lobes. Evaluation of the lung parenchyma is constrained by respiratory motion. Within that constraint, there are no suspicious pulmonary  nodules. No focal consolidation. No pleural effusion or pneumothorax. Musculoskeletal: Degenerative changes of the thoracic spine. No fracture is seen. CT ABDOMEN PELVIS FINDINGS Hepatobiliary: 9 mm probable cyst in the left hepatic dome (series 3/image 46). 12 mm probable cyst inferiorly in the right hepatic lobe (series 3/image 62). Gallbladder is unremarkable. No intrahepatic or extrahepatic ductal dilatation. Pancreas: Within normal limits. Spleen: Within normal limits. Adrenals/Urinary Tract: Adrenal glands are within normal limits. 2.0 cm hyperdense/hemorrhagic cyst along the medial left upper kidney (series 3/image 87), unchanged. Bilateral renal cysts, simple. Cortical scarring along the posterior right lower kidney. 2 mm nonobstructing right lower pole renal calculus (series 3/image 39). No hydronephrosis. Bladder is within normal limits. Stomach/Bowel: Stomach is notable for  a tiny hiatal hernia. No evidence of bowel obstruction. Normal appendix (series 3/image 108). Left colonic diverticulosis, without evidence of diverticulitis. Vascular/Lymphatic: No evidence of abdominal aortic aneurysm. Atherosclerotic calcifications of the abdominal aorta and branch vessels. No suspicious abdominopelvic lymphadenopathy. Status post pelvic lymph node dissection. Reproductive: Status post prostatectomy. Other: No abdominopelvic ascites. No hemoperitoneum or free air. Musculoskeletal: Mild degenerative changes of the lumbar spine. No fracture is seen. IMPRESSION: No evidence of traumatic injury to the chest, abdomen, or pelvis. Status post prostatectomy. No evidence of recurrent or metastatic disease. 2 cm hyperdense/hemorrhagic cyst along the medial left upper kidney, benign (Bosniak II). Additional stable ancillary findings as above. Electronically Signed   By: Julian Hy M.D.   On: 04/25/2018 20:58   Ct Cervical Spine Wo Contrast  Result Date: 04/25/2018 CLINICAL DATA:  Neck pain after fall EXAM: CT  CERVICAL SPINE WITHOUT CONTRAST TECHNIQUE: Multidetector CT imaging of the cervical spine was performed without intravenous contrast. Multiplanar CT image reconstructions were also generated. COMPARISON:  None. FINDINGS: Alignment: Maintained cervical lordosis. Skull base and vertebrae: Intact skull base. Subcortical degenerative cystic change of the odontoid process is noted. Partial ankylosis across the C2 through C5 interspaces. Soft tissues and spinal canal: No prevertebral fluid or swelling. No visible canal hematoma. Disc levels: Cervical spondylosis with moderate-to-marked disc flattening from C2 through C7 as well as moderate disc flattening C7-T1 and mild from T1 through T4. Facet ankylosis is noted C2-C4 on the left. Bilateral facet arthrosis with facet joint space narrowing and hypertrophy is seen. Uncovertebral joint osteoarthritis is noted bilaterally from C3 through C7. Upper chest: Emphysematous changes at the apices both lungs. Aortic atherosclerosis of the included aortic arch is identified. 5 mm nodular density in the left upper lobe. Other: Extracranial carotid arteriosclerosis is seen bilaterally. IMPRESSION: 1. Cervical spondylosis without acute fracture or posttraumatic subluxation. 2. Emphysema. 3. 5 mm left upper lobe pulmonary nodule. No follow-up needed if patient is low-risk. Non-contrast chest CT can be considered in 12 months if patient is high-risk. This recommendation follows the consensus statement: Guidelines for Management of Incidental Pulmonary Nodules Detected on CT Images: From the Fleischner Society 2017; Radiology 2017; 284:228-243. Aortic Atherosclerosis (ICD10-I70.0) and Emphysema (ICD10-J43.9). Electronically Signed   By: Ashley Royalty M.D.   On: 04/25/2018 21:00   Mr Brain Wo Contrast  Result Date: 04/27/2018 CLINICAL DATA:  Stroke follow-up. Aphasia. Traumatic brain injury 30 years ago. EXAM: MRI HEAD WITHOUT CONTRAST TECHNIQUE: Multiplanar, multiecho pulse sequences  of the brain and surrounding structures were obtained without intravenous contrast. COMPARISON:  CT head 04/25/2018 FINDINGS: Brain: Moderate atrophy and ventricular enlargement. Extensive chronic microvascular ischemia throughout the cerebral white matter. Chronic hemorrhagic infarct left parietal lobe. Encephalomalacia right frontal lobe. Prior right frontal craniotomy for unknown diagnosis. Negative for acute infarct or mass. No fluid collection or midline shift. Vascular: Normal arterial flow voids Skull and upper cervical spine: Right frontal craniotomy Sinuses/Orbits: Mucosal edema paranasal sinuses. Probable nasal polyps bilaterally. Bilateral cataract surgery. Other: None IMPRESSION: Moderate atrophy.  Extensive chronic ischemia Negative for acute infarct. Electronically Signed   By: Franchot Gallo M.D.   On: 04/27/2018 18:30   Ct Abdomen Pelvis W Contrast  Result Date: 04/25/2018 CLINICAL DATA:  Dizziness, fall EXAM: CT CHEST, ABDOMEN, AND PELVIS WITH CONTRAST TECHNIQUE: Multidetector CT imaging of the chest, abdomen and pelvis was performed following the standard protocol during bolus administration of intravenous contrast. CONTRAST:  97mL OMNIPAQUE IOHEXOL 300 MG/ML  SOLN COMPARISON:  CT abdomen/pelvis dated 01/17/2017  FINDINGS: CT CHEST FINDINGS Cardiovascular: No evidence of traumatic aortic injury. No evidence of thoracic aortic aneurysm or dissection. Atherosclerotic calcifications of the aortic arch. The heart is normal in size.  No pericardial effusion. Mild coronary atherosclerosis of the LAD and right coronary artery. Mediastinum/Nodes: No evidence of mediastinal hematoma. No suspicious mediastinal lymphadenopathy. Visualized thyroid is grossly unremarkable. Lungs/Pleura: Biapical pleural-parenchymal scarring. Mild centrilobular and paraseptal emphysematous changes, upper lobe predominant. Mild subpleural reticulation/dependent atelectasis in the bilateral lower lobes. Evaluation of the lung  parenchyma is constrained by respiratory motion. Within that constraint, there are no suspicious pulmonary nodules. No focal consolidation. No pleural effusion or pneumothorax. Musculoskeletal: Degenerative changes of the thoracic spine. No fracture is seen. CT ABDOMEN PELVIS FINDINGS Hepatobiliary: 9 mm probable cyst in the left hepatic dome (series 3/image 46). 12 mm probable cyst inferiorly in the right hepatic lobe (series 3/image 62). Gallbladder is unremarkable. No intrahepatic or extrahepatic ductal dilatation. Pancreas: Within normal limits. Spleen: Within normal limits. Adrenals/Urinary Tract: Adrenal glands are within normal limits. 2.0 cm hyperdense/hemorrhagic cyst along the medial left upper kidney (series 3/image 87), unchanged. Bilateral renal cysts, simple. Cortical scarring along the posterior right lower kidney. 2 mm nonobstructing right lower pole renal calculus (series 3/image 39). No hydronephrosis. Bladder is within normal limits. Stomach/Bowel: Stomach is notable for a tiny hiatal hernia. No evidence of bowel obstruction. Normal appendix (series 3/image 108). Left colonic diverticulosis, without evidence of diverticulitis. Vascular/Lymphatic: No evidence of abdominal aortic aneurysm. Atherosclerotic calcifications of the abdominal aorta and branch vessels. No suspicious abdominopelvic lymphadenopathy. Status post pelvic lymph node dissection. Reproductive: Status post prostatectomy. Other: No abdominopelvic ascites. No hemoperitoneum or free air. Musculoskeletal: Mild degenerative changes of the lumbar spine. No fracture is seen. IMPRESSION: No evidence of traumatic injury to the chest, abdomen, or pelvis. Status post prostatectomy. No evidence of recurrent or metastatic disease. 2 cm hyperdense/hemorrhagic cyst along the medial left upper kidney, benign (Bosniak II). Additional stable ancillary findings as above. Electronically Signed   By: Julian Hy M.D.   On: 04/25/2018 20:58   Dg  Chest Port 1 View  Result Date: 04/26/2018 CLINICAL DATA:  Fever EXAM: PORTABLE CHEST 1 VIEW COMPARISON:  04/13/2018 FINDINGS: Normal heart size. Low lung volumes. Bibasilar atelectasis versus airspace disease left greater than right. No pneumothorax or pleural effusion. IMPRESSION: Bibasilar atelectasis versus airspace disease left greater than right Electronically Signed   By: Marybelle Killings M.D.   On: 04/26/2018 09:46   Dg Hand Complete Right  Result Date: 04/25/2018 CLINICAL DATA:  Recent fall with right hand pain, initial encounter EXAM: RIGHT HAND - COMPLETE 3+ VIEW COMPARISON:  None. FINDINGS: Mild degenerative changes are noted within the carpal bones. Some mild interphalangeal degenerative changes are noted as well. No acute fracture or dislocation is identified. IMPRESSION: Mild degenerative change without acute bony abnormality. Electronically Signed   By: Inez Catalina M.D.   On: 04/25/2018 20:45   Ct Maxillofacial Wo Contrast  Result Date: 04/25/2018 CLINICAL DATA:  Blunt trauma status post fall. EXAM: CT MAXILLOFACIAL WITHOUT CONTRAST TECHNIQUE: Multidetector CT imaging of the maxillofacial structures was performed. Multiplanar CT image reconstructions were also generated. COMPARISON:  11/16/2017 head CT FINDINGS: Osseous: No fracture or mandibular dislocation. Degenerative subcortical cystic change of the odontoid process. Slight asymmetry across the Lantus dental likely degenerative in etiology. No acute fracture Orbits: Negative. No traumatic or inflammatory finding. Bilateral cataract extractions. Sinuses: Moderate ethmoid and bilateral maxillary and sphenoid sinus mucosal thickening is seen. Air-fluid levels Soft tissues:  Right premalar soft tissue swelling and contusion. Limited intracranial: Right frontal and left temporoparietal encephalomalacia from remote infarcts. IMPRESSION: 1. No evidence of acute maxillofacial fracture. 2. Right premalar soft tissue swelling and contusion. 3.  Moderate paranasal sinus disease. 4. Remote infarcts right frontal and left temporoparietal distribution. Electronically Signed   By: Ashley Royalty M.D.   On: 04/25/2018 21:07     The results of significant diagnostics from this hospitalization (including imaging, microbiology, ancillary and laboratory) are listed below for reference.     Microbiology: Recent Results (from the past 240 hour(s))  Urine culture     Status: None   Collection Time: 04/25/18  9:28 PM  Result Value Ref Range Status   Specimen Description URINE, RANDOM  Final   Special Requests NONE  Final   Culture   Final    NO GROWTH Performed at [facility], [address], [place], [place] [address]    Report Status 04/27/2018 FINAL  Final  Respiratory Panel by PCR     Status: None   Collection Time: 04/26/18  2:52 AM  Result Value Ref Range Status   Adenovirus NOT DETECTED NOT DETECTED Final   Coronavirus 229E NOT DETECTED NOT DETECTED Final   Coronavirus HKU1 NOT DETECTED NOT DETECTED Final   Coronavirus NL63 NOT DETECTED NOT DETECTED Final   Coronavirus OC43 NOT DETECTED NOT DETECTED Final   Metapneumovirus NOT DETECTED NOT DETECTED Final   Rhinovirus / Enterovirus NOT DETECTED NOT DETECTED Final   Influenza A NOT DETECTED NOT DETECTED Final   Influenza B NOT DETECTED NOT DETECTED Final   Parainfluenza Virus 1 NOT DETECTED NOT DETECTED Final   Parainfluenza Virus 2 NOT DETECTED NOT DETECTED Final   Parainfluenza Virus 3 NOT DETECTED NOT DETECTED Final   Parainfluenza Virus 4 NOT DETECTED NOT DETECTED Final   Respiratory Syncytial Virus NOT DETECTED NOT DETECTED Final   Bordetella pertussis NOT DETECTED NOT DETECTED Final   Chlamydophila pneumoniae NOT DETECTED NOT DETECTED Final   Mycoplasma pneumoniae NOT DETECTED NOT DETECTED Final    Comment: Performed at [facility], [place] [address], [place], [place] [address]  Culture, blood (routine x 2)     Status: None (Preliminary result)   Collection  Time: 04/26/18  4:00 AM  Result Value Ref Range Status   Specimen Description BLOOD LEFT HAND  Final   Special Requests   Final    BOTTLES DRAWN AEROBIC AND ANAEROBIC Blood Culture adequate volume   Culture   Final    NO GROWTH 2 DAYS Performed at [facility], [place] [address], [place], [place] [address]    Report Status PENDING  Incomplete  Culture, blood (routine x 2)     Status: None (Preliminary result)   Collection Time: 04/26/18  4:20 AM  Result Value Ref Range Status   Specimen Description BLOOD RIGHT HAND  Final   Special Requests   Final    BOTTLES DRAWN AEROBIC ONLY Blood Culture adequate volume   Culture   Final    NO GROWTH 2 DAYS Performed at [facility], [place] [address], [place], [place] [address]    Report Status PENDING  Incomplete     Labs: BNP (last 3 results) No results for input(s): BNP in the last 8760 hours. Basic Metabolic Panel: Recent Labs  Lab 04/25/18 1735 04/25/18 1742 04/26/18 0418 04/28/18 0524  NA 136 138 140 141  K 4.4 4.1 3.7 4.1  CL 111 110 108 110  CO2 20*  --  20* 23  GLUCOSE 108* 108* 136* 123*  BUN 20 22 18 18   CREATININE 0.96 1.00 1.09 0.96  CALCIUM 9.6  --  9.2 9.0   Liver Function Tests: Recent Labs  Lab 04/25/18 1735 04/26/18 0418  AST 20 20  ALT 13 15  ALKPHOS 74 75  BILITOT 0.6 0.9  PROT 6.1* 6.3*  ALBUMIN 3.7 3.7   Recent Labs  Lab 04/26/18 0418  LIPASE 29   Recent Labs  Lab 04/26/18 0417 04/27/18 1240  AMMONIA 23 33   CBC: Recent Labs  Lab 04/25/18 1735 04/25/18 1742 04/26/18 0418 04/28/18 0524  WBC 10.3  --  12.1* 10.7*  NEUTROABS 7.1  --   --   --   HGB 14.7 14.6 14.0 13.5  HCT 46.0 43.0 42.5 41.3  MCV 90.2  --  86.9 87.1  PLT 213  --  216 192   Cardiac Enzymes: No results for input(s): CKTOTAL, CKMB, CKMBINDEX, TROPONINI in the last 168 hours. BNP: Invalid input(s): POCBNP CBG: Recent Labs  Lab 04/25/18 1908  GLUCAP 92   D-Dimer No results for input(s): DDIMER in the  last 72 hours. Hgb A1c Recent Labs    04/27/18 1240  HGBA1C 5.8*   Lipid Profile No results for input(s): CHOL, HDL, LDLCALC, TRIG, CHOLHDL, LDLDIRECT in the last 72 hours. Thyroid function studies Recent Labs    04/27/18 1240  TSH 1.433   Anemia work up Recent Labs    04/27/18 1240  VITAMINB12 562   Urinalysis    Component Value Date/Time   COLORURINE STRAW (A) 04/25/2018 2128   APPEARANCEUR CLEAR 04/25/2018 2128   LABSPEC 1.014 04/25/2018 2128   PHURINE 6.0 04/25/2018 2128   GLUCOSEU NEGATIVE 04/25/2018 2128   HGBUR NEGATIVE 04/25/2018 2128   BILIRUBINUR NEGATIVE 04/25/2018 2128   KETONESUR NEGATIVE 04/25/2018 2128   PROTEINUR NEGATIVE 04/25/2018 2128   UROBILINOGEN 0.2 12/22/2013 0956   NITRITE NEGATIVE 04/25/2018 2128   LEUKOCYTESUR NEGATIVE 04/25/2018 2128   Sepsis Labs Invalid input(s): PROCALCITONIN,  WBC,  LACTICIDVEN Microbiology Recent Results (from the past 240 hour(s))  Urine culture     Status: None   Collection Time: 04/25/18  9:28 PM  Result Value Ref Range Status   Specimen Description URINE, RANDOM  Final   Special Requests NONE  Final   Culture   Final    NO GROWTH Performed at [facility], [place] [address], [place], [place] [address]    Report Status 04/27/2018 FINAL  Final  Respiratory Panel by PCR     Status: None   Collection Time: 04/26/18  2:52 AM  Result Value Ref Range Status   Adenovirus NOT DETECTED NOT DETECTED Final   Coronavirus 229E NOT DETECTED NOT DETECTED Final   Coronavirus HKU1 NOT DETECTED NOT DETECTED Final   Coronavirus NL63 NOT DETECTED NOT DETECTED Final   Coronavirus OC43 NOT DETECTED NOT DETECTED Final   Metapneumovirus NOT DETECTED NOT DETECTED Final   Rhinovirus / Enterovirus NOT DETECTED NOT DETECTED Final   Influenza A NOT DETECTED NOT DETECTED Final   Influenza B NOT DETECTED NOT DETECTED Final   Parainfluenza Virus 1 NOT DETECTED NOT DETECTED Final   Parainfluenza Virus 2 NOT DETECTED NOT DETECTED  Final   Parainfluenza Virus 3 NOT DETECTED NOT DETECTED Final   Parainfluenza Virus 4 NOT DETECTED NOT DETECTED Final   Respiratory Syncytial Virus NOT DETECTED NOT DETECTED Final   Bordetella pertussis NOT DETECTED NOT DETECTED Final  Chlamydophila pneumoniae NOT DETECTED NOT DETECTED Final   Mycoplasma pneumoniae NOT DETECTED NOT DETECTED Final    Comment: Performed at [facility], [place] [address] [place], [place] [address]  Culture, blood (routine x 2)     Status: None (Preliminary result)   Collection Time: 04/26/18  4:00 AM  Result Value Ref Range Status   Specimen Description BLOOD LEFT HAND  Final   Special Requests   Final    BOTTLES DRAWN AEROBIC AND ANAEROBIC Blood Culture adequate volume   Culture   Final    NO GROWTH 2 DAYS Performed at [facility], [place] [address] [place], [place] [address]    Report Status PENDING  Incomplete  Culture, blood (routine x 2)     Status: None (Preliminary result)   Collection Time: 04/26/18  4:20 AM  Result Value Ref Range Status   Specimen Description BLOOD RIGHT HAND  Final   Special Requests   Final    BOTTLES DRAWN AEROBIC ONLY Blood Culture adequate volume   Culture   Final    NO GROWTH 2 DAYS Performed at [facility], [place] [address] [place], [place] [address]    Report Status PENDING  Incomplete     Time coordinating discharge in minutes: 65  SIGNED:   Debbe Odea, MD  [facility] 04/28/2018, 3:19 PM Pager   If 7PM-7AM, please contact night-coverage [email or web address] Password [email or web address]

## 2018-04-28 NOTE — Progress Notes (Signed)
Inpatient Rehabilitation-Admissions Coordinator    Met with patient and his wife at the bedside to discuss team's recommendation for inpatient rehabilitation. Shared booklets, expectations while in CIR, expected length of stay, and anticipated functional level at DC; pt and wife interested in CIR program.   [facility] has received medical approval for admit to CIR today. AC has updated CM and RN. Pt and family in agreement for admit today.   Please call if questions.   Jhonnie Garner, OTR/L  Rehab Admissions Coordinator  [phone] 04/28/2018 2:57 PM

## 2018-04-28 NOTE — Progress Notes (Signed)
EEG completed, results pending. 
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
## 2018-04-28 NOTE — Progress Notes (Signed)
Physical Therapy Treatment Patient Details Name: Eric George MRN: [id number] DOB: [birth year] Today's Date: 04/28/2018    History of Present Illness Pt is an 84 y.o. male with PMH including but not limited to TBI and HTN. Patient admitted after sustaining a fall at home. Noted to be febrile in the ED with possible sepsis. MRI was negative for any acute findings.    PT Comments    Pt making steady progress with functional mobility and tolerated ambulating a bit further this session. He continues to require UE support and min A overall for stability. Pt appears to remain at an increased risk for falls at this time and would greatly benefit from further intensive therapies at CIR prior to returning home with family. PT will continue to follow acutely to progress mobility as tolerated.     Follow Up Recommendations  CIR     Equipment Recommendations  None recommended by PT    Recommendations for Other Services       Precautions / Restrictions Precautions Precautions: Fall Restrictions Weight Bearing Restrictions: No    Mobility  Bed Mobility Overal bed mobility: Needs Assistance Bed Mobility: Supine to Sit     Supine to sit: Min assist     General bed mobility comments: increased time and effort, cueing for sequencing, assist for trunk elevation  Transfers Overall transfer level: Needs assistance Equipment used: Rolling walker (2 wheeled) Transfers: Sit to/from Stand Sit to Stand: Min guard         General transfer comment: min guard for safety, cueing for safe hand placement  Ambulation/Gait Ambulation/Gait assistance: Min assist Gait Distance (Feet): 100 Feet Assistive device: Rolling walker (2 wheeled) Gait Pattern/deviations: Step-through pattern;Decreased step length - right;Decreased step length - left;Decreased stride length Gait velocity: decreased   General Gait Details: pt with modest instability with ambulation requiring min A for support with use  of RW; pt also with zero to minimal foot clearance on R with swing phase of gait   Stairs             Wheelchair Mobility    Modified Rankin (Stroke Patients Only)       Balance Overall balance assessment: Needs assistance;History of Falls Sitting-balance support: Feet supported Sitting balance-Leahy Scale: Fair     Standing balance support: Bilateral upper extremity supported Standing balance-Leahy Scale: Poor                              Cognition Arousal/Alertness: Awake/alert Behavior During Therapy: WFL for tasks assessed/performed Overall Cognitive Status: Impaired/Different from baseline Area of Impairment: Safety/judgement;Problem solving;Following commands                       Following Commands: Follows one step commands with increased time Safety/Judgement: Decreased awareness of deficits;Decreased awareness of safety   Problem Solving: Slow processing;Decreased initiation;Difficulty sequencing;Requires verbal cues;Requires tactile cues        Exercises      General Comments        Pertinent Vitals/Pain Pain Assessment: No/denies pain    Home Living                      Prior Function            PT Goals (current goals can now be found in the care plan section) Acute Rehab PT Goals PT Goal Formulation: With patient/family Time For Goal Achievement: 05/10/18 Potential to  Achieve Goals: Good Progress towards PT goals: Progressing toward goals    Frequency    Min 3X/week      PT Plan Current plan remains appropriate    Co-evaluation              AM-PAC PT "6 Clicks" Mobility   Outcome Measure  Help needed turning from your back to your side while in a flat bed without using bedrails?: A Little Help needed moving from lying on your back to sitting on the side of a flat bed without using bedrails?: A Little Help needed moving to and from a bed to a chair (including a wheelchair)?: A  Little Help needed standing up from a chair using your arms (e.g., wheelchair or bedside chair)?: A Little Help needed to walk in hospital room?: A Lot Help needed climbing 3-5 steps with a railing? : Total 6 Click Score: 15    End of Session Equipment Utilized During Treatment: Gait belt Activity Tolerance: Patient tolerated treatment well Patient left: in chair;with call bell/phone within reach;with chair alarm set;with family/visitor present Nurse Communication: Mobility status PT Visit Diagnosis: Unsteadiness on feet (R26.81);Difficulty in walking, not elsewhere classified (R26.2)     Time: 0177-9390 PT Time Calculation (min) (ACUTE ONLY): 17 min  Charges:  $Gait Training: 8-22 mins                     Sherie Don, [place], DPT  Acute Rehabilitation Services Pager [phone] Office [place] 04/28/2018, 12:07 PM

## 2018-04-29 ENCOUNTER — Inpatient Hospital Stay (HOSPITAL_COMMUNITY): Payer: Medicare Other | Admitting: Occupational Therapy

## 2018-04-29 ENCOUNTER — Inpatient Hospital Stay (HOSPITAL_COMMUNITY): Payer: Medicare Other | Admitting: Physical Therapy

## 2018-04-29 ENCOUNTER — Inpatient Hospital Stay (HOSPITAL_COMMUNITY): Payer: Medicare Other | Admitting: Speech Pathology

## 2018-04-29 DIAGNOSIS — R269 Unspecified abnormalities of gait and mobility: Secondary | ICD-10-CM

## 2018-04-29 DIAGNOSIS — I10 Essential (primary) hypertension: Secondary | ICD-10-CM

## 2018-04-29 DIAGNOSIS — R339 Retention of urine, unspecified: Secondary | ICD-10-CM

## 2018-04-29 DIAGNOSIS — J690 Pneumonitis due to inhalation of food and vomit: Secondary | ICD-10-CM

## 2018-04-29 LAB — CBC WITH DIFFERENTIAL/PLATELET
Abs Immature Granulocytes: 0.06 10*3/uL (ref 0.00–0.07)
Basophils Absolute: 0 10*3/uL (ref 0.0–0.1)
Basophils Relative: 0 %
Eosinophils Absolute: 0.6 10*3/uL — ABNORMAL HIGH (ref 0.0–0.5)
Eosinophils Relative: 6 %
HCT: 40.7 % (ref 39.0–52.0)
Hemoglobin: 13.5 g/dL (ref 13.0–17.0)
Immature Granulocytes: 1 %
Lymphocytes Relative: 13 %
Lymphs Abs: 1.3 10*3/uL (ref 0.7–4.0)
MCH: 29.1 pg (ref 26.0–34.0)
MCHC: 33.2 g/dL (ref 30.0–36.0)
MCV: 87.7 fL (ref 80.0–100.0)
Monocytes Absolute: 0.9 10*3/uL (ref 0.1–1.0)
Monocytes Relative: 9 %
Neutro Abs: 6.7 10*3/uL (ref 1.7–7.7)
Neutrophils Relative %: 71 %
Platelets: 206 10*3/uL (ref 150–400)
RBC: 4.64 MIL/uL (ref 4.22–5.81)
RDW: 12.9 % (ref 11.5–15.5)
WBC: 9.5 10*3/uL (ref 4.0–10.5)
nRBC: 0 % (ref 0.0–0.2)

## 2018-04-29 LAB — COMPREHENSIVE METABOLIC PANEL
ALT: 15 U/L (ref 0–44)
AST: 17 U/L (ref 15–41)
Albumin: 3.1 g/dL — ABNORMAL LOW (ref 3.5–5.0)
Alkaline Phosphatase: 58 U/L (ref 38–126)
Anion gap: 10 (ref 5–15)
BUN: 21 mg/dL (ref 8–23)
CO2: 23 mmol/L (ref 22–32)
Calcium: 8.9 mg/dL (ref 8.9–10.3)
Chloride: 109 mmol/L (ref 98–111)
Creatinine, Ser: 0.94 mg/dL (ref 0.61–1.24)
GFR calc Af Amer: >60 mL/min (ref >60)
GFR calc non Af Amer: >60 mL/min (ref >60)
Glucose, Bld: 117 mg/dL — ABNORMAL HIGH (ref 70–99)
Potassium: 4 mmol/L (ref 3.5–5.1)
Sodium: 142 mmol/L (ref 135–145)
Total Bilirubin: 0.9 mg/dL (ref 0.3–1.2)
Total Protein: 5.6 g/dL — ABNORMAL LOW (ref 6.5–8.1)

## 2018-04-29 MED ORDER — PRO-STAT SUGAR FREE PO LIQD
30.0000 mL | Freq: Two times a day (BID) | ORAL | Status: DC
  Administered 2018-04-29 – 2018-05-04 (×9): 30 mL via ORAL
  Filled 2018-04-29 (×13): qty 30

## 2018-04-29 MED ORDER — SODIUM CHLORIDE 0.9 % IV SOLN
INTRAVENOUS | Status: DC | PRN
  Administered 2018-04-29 (×2): via INTRAVENOUS

## 2018-04-29 NOTE — Progress Notes (Signed)
PHYSICAL MEDICINE & REHABILITATION PROGRESS NOTE   Subjective/Complaints: Had a good night. Wife at bedside. Denies pain. Did become a little confused in evening  ROS: Patient denies fever, rash, sore throat, blurred vision, nausea, vomiting, diarrhea, cough, shortness of breath or chest pain, joint or back pain, headache, or mood change.   Objective:   Mr Brain Wo Contrast  Result Date: 04/27/2018 CLINICAL DATA:  Stroke follow-up. Aphasia. Traumatic brain injury 30 years ago. EXAM: MRI HEAD WITHOUT CONTRAST TECHNIQUE: Multiplanar, multiecho pulse sequences of the brain and surrounding structures were obtained without intravenous contrast. COMPARISON:  CT head 04/25/2018 FINDINGS: Brain: Moderate atrophy and ventricular enlargement. Extensive chronic microvascular ischemia throughout the cerebral white matter. Chronic hemorrhagic infarct left parietal lobe. Encephalomalacia right frontal lobe. Prior right frontal craniotomy for unknown diagnosis. Negative for acute infarct or mass. No fluid collection or midline shift. Vascular: Normal arterial flow voids Skull and upper cervical spine: Right frontal craniotomy Sinuses/Orbits: Mucosal edema paranasal sinuses. Probable nasal polyps bilaterally. Bilateral cataract surgery. Other: None IMPRESSION: Moderate atrophy.  Extensive chronic ischemia Negative for acute infarct. Electronically Signed   By: Franchot Gallo M.D.   On: 04/27/2018 18:30   Recent Labs    04/28/18 0524 04/29/18 0529  WBC 10.7* 9.5  HGB 13.5 13.5  HCT 41.3 40.7  PLT 192 206   Recent Labs    04/28/18 0524 04/29/18 0529  NA 141 142  K 4.1 4.0  CL 110 109  CO2 23 23  GLUCOSE 123* 117*  BUN 18 21  CREATININE 0.96 0.94  CALCIUM 9.0 8.9    Intake/Output Summary (Last 24 hours) at 04/29/2018 0913 Last data filed at 04/29/2018 0700 Gross per 24 hour  Intake 240 ml  Output -  Net 240 ml     Physical Exam: Vital Signs Blood pressure (!) 152/83, pulse  86, temperature 98 F (36.7 C), resp. rate 16, height 6\' 2"  (1.88 m), weight 81 kg, SpO2 96 %. Constitutional: No distress . Vital signs reviewed. HEENT: EOMI, oral membranes moist Neck: supple Cardiovascular: intermittent PVC's without murmur. No JVD    Respiratory: CTA Bilaterally without wheezes or rales. Normal effort    GI: BS +, non-tender, non-distended  Musculoskeletal:  General: No edema.  Neurological: Patient is alert. Needed moderate cues to provide name of hospital. He was able to recall yesterday was Christmas with some prompting. Follow simple commands. Upper extremities 4/5. LE: 3/5 prox at hips, knees and 4/5 ADF/PF. No focal sensory deficits. Mild intentional tremor, decreased [place]. Skin: Abrasions on both knees stable Psychiatric:  Pleasant and cooperative    Assessment/Plan: 1. 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  Physiatrist is providing close team supervision and 24 hour management of active medical problems listed below.  Physiatrist and rehab team continue to assess barriers to discharge/monitor patient progress toward functional and medical goals  Care Tool:  Bathing              Bathing assist       Upper Body Dressing/Undressing Upper body dressing        Upper body assist      Lower Body Dressing/Undressing Lower body dressing            Lower body assist       Toileting Toileting    Toileting assist Assist for toileting: Minimal Assistance - Patient > 75%     Transfers  Chair/bed transfer  Transfers assist     Chair/bed transfer assist level: Minimal Assistance - Patient > 75%     Locomotion Ambulation   Ambulation assist              Walk 10 feet activity   Assist           Walk 50 feet activity   Assist           Walk 150 feet activity   Assist            Walk 10 feet on uneven surface  activity   Assist           Wheelchair     Assist               Wheelchair 50 feet with 2 turns activity    Assist            Wheelchair 150 feet activity     Assist           Medical Problem List and Plan: 1.Acute on chronic gait multifactorial disorder/debility/possible sepsisin settingofpriorTBI and CVA -beginning therapies -focus on safety, equipment, family education 2. DVT Prophylaxis/Anticoagulation: Subcutaneous Lovenox.monitor for any bleeding episodes 3. Pain Management:Tylenol as needed 4. Mood:Zoloft 100 mg daily 5. Neuropsych: This patientiscapable of making decisions on hisown behalf. 6. Skin/Wound Care:Routine skin checks 7. Fluids/Electrolytes/Nutrition:  -encourage PO  -I personally reviewed the patient's labs today.   -add protein supp for low albumin  8. ID. Complete 2 more days of rocephin 9. Urinary retention/BPH. Check PVR's. No voiding issues so far. 10. Hypertension. Avapro 150 mg daily, Coreg 3.125 mg twice a day.  11. Hyperlipidemia. Lipitor 12. Chronic constipation. MiraLAX scheduled.  -no record of bm but wife states he had bm yesterday.     LOS: 1 days A FACE TO FACE EVALUATION WAS PERFORMED  Meredith Staggers 04/29/2018, 9:13 AM
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
## 2018-04-29 NOTE — Consult Note (Signed)
            [facility] Primary Care Navigator  04/29/2018  BURGESS SHERIFF Sep 23, 1930 [id number]   Wentto see patient at the bedside to identify possible discharge needs but hehad been transferred to [place] (CIR 4W 05)from 3W 38.   Per MD note, patient was admittedafter a fall on the pavement while out for a walk and hit his face and right arm. He was noted with new onset confusion/ acute encephalopathy, fever, urinary retention.  Patient was transferred to [place] yesterday per therapy recommendation.   Primary care provider's office is listed as providing (TOC) transition of care follow-up.   For additional questions please contact:  Edwena Felty A. Shaquoia Miers, BSN, RN-BC [facility] PRIMARY CARE Navigator Cell: [phone]

## 2018-04-29 NOTE — IPOC Note (Signed)
Overall Plan of Care [facility]) Patient Details Name: Eric George MRN: [id number] DOB: February 01, 1931  Admitting Diagnosis: <principal problem not specified>  Hospital Problems: Active Problems:   Gait disorder     Functional Problem List: Nursing Edema, Endurance, Medication Management, Pain, Perception, Safety, Skin Integrity  PT Balance, Endurance, Safety  OT Balance, Behavior, Cognition, Safety, Endurance, Motor  SLP    TR         Basic ADL's: OT Grooming, Bathing, Dressing, Toileting, Eating     Advanced  ADL's: OT Light Housekeeping     Transfers: PT Bed Mobility, Bed to Chair, Car, Furniture, Futures trader, Metallurgist: PT Ambulation, Stairs     Additional Impairments: OT None  SLP None      TR      Anticipated Outcomes Item Anticipated Outcome  Self Feeding Supervision/cuing   Swallowing      Basic self-care  Supervision/cuing   Toileting  Supervision/cuing   Engineer, water  Supervision with LRAD  Communication     Cognition     Pain  <3 on a 0-10 pain scale  Safety/Judgment  Call for assistance before getting up for bathroom or ambulating   Therapy Plan: PT Intensity: Minimum of 1-2 x/day ,45 to 90 minutes PT Frequency: 5 out of 7 days PT Duration Estimated Length of Stay: 5-7 days OT Intensity: Minimum of 1-2 x/day, 45 to 90 minutes OT Frequency: 5 out of 7 days OT Duration/Estimated Length of Stay: 5-7 days      Team Interventions: Nursing Interventions Patient/Family Education, Pain Management, Medication Management, Discharge Planning, Skin Care/Wound Management, Cognitive Remediation/Compensation  PT interventions Ambulation/gait training, Medical illustrator training, Cognitive remediation/compensation, Community reintegration, Discharge planning, Disease management/prevention, DME/adaptive equipment instruction, Functional  mobility training, Neuromuscular re-education, Pain management, Patient/family education, Psychosocial support, Therapeutic Activities, Therapeutic Exercise, UE/LE Strength taining/ROM, UE/LE Coordination activities  OT Interventions Training and development officer, Community reintegration, Disease mangement/prevention, Neuromuscular re-education, Patient/family education, Self Care/advanced ADL retraining, Therapeutic Exercise, UE/LE Coordination activities, Wheelchair propulsion/positioning, Cognitive remediation/compensation, Discharge planning, DME/adaptive equipment instruction, Functional mobility training, Pain management, Psychosocial support, Therapeutic Activities, UE/LE Strength taining/ROM  SLP Interventions    TR Interventions    SW/CM Interventions Discharge Planning, Psychosocial Support, Patient/Family Education   Barriers to Discharge MD  Medical stability  Nursing Medical stability, Medication compliance, Nutrition means    PT Medical stability    OT Medical stability, Lack of/limited family support    SLP      SW       Team Discharge Planning: Destination: PT-Home ,OT- Home , SLP-Home Projected Follow-up: PT-Outpatient PT, 24 hour supervision/assistance, OT-  Home health OT, SLP-24 hour supervision/assistance Projected Equipment Needs: PT-Rolling walker with 5" wheels, OT- Tub/shower seat, To be determined, SLP-None recommended by SLP Equipment Details: PT-TBD pending progress, OT-  Patient/family involved in discharge planning: PT- Patient, Family Midwife,  OT-Patient, Family member/caregiver, SLP-Patient  MD ELOS: 5-7 days Medical Rehab Prognosis:  Excellent Assessment: The patient has been admitted for CIR therapies with the diagnosis of gait disorder, debility. The team will be addressing functional mobility, strength, stamina, balance, safety, adaptive techniques and equipment, self-care, bowel and bladder mgt, patient and caregiver education, NMR, community  reentry. Goals have been set at supervision with mobility and self-care.    Meredith Staggers, MD, FAAPMR      See Team Conference Notes for weekly updates to the plan of care

## 2018-04-29 NOTE — Progress Notes (Signed)
Inpatient Rehabilitation  Patient information reviewed and entered into eRehab system by Babita Amaker M. Hyun Marsalis, M.A., CCC/SLP, PPS Coordinator.  Information including medical coding, functional ability and quality indicators will be reviewed and updated through discharge.    Per nursing patient was given "Data Collection Information Summary" for Patients in Inpatient Rehabilitation Facilities with attached "Privacy Act Statement-Health Care Records" upon admission.   
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
## 2018-04-30 ENCOUNTER — Inpatient Hospital Stay (HOSPITAL_COMMUNITY): Payer: Medicare Other | Admitting: Occupational Therapy

## 2018-04-30 ENCOUNTER — Inpatient Hospital Stay (HOSPITAL_COMMUNITY): Payer: Medicare Other | Admitting: Physical Therapy

## 2018-04-30 DIAGNOSIS — R7303 Prediabetes: Secondary | ICD-10-CM

## 2018-04-30 DIAGNOSIS — A419 Sepsis, unspecified organism: Secondary | ICD-10-CM

## 2018-04-30 NOTE — Progress Notes (Signed)
[place] PHYSICAL MEDICINE & REHABILITATION PROGRESS NOTE   Subjective/Complaints: Patient seen laying in bed this morning.  Wife states he slept well overnight.  She states he had a good first day of therapies yesterday.  He is confused this morning.  ROS: Limited due to cognition  Objective:   No results found. Recent Labs    04/28/18 0524 04/29/18 0529  WBC 10.7* 9.5  HGB 13.5 13.5  HCT 41.3 40.7  PLT 192 206   Recent Labs    04/28/18 0524 04/29/18 0529  NA 141 142  K 4.1 4.0  CL 110 109  CO2 23 23  GLUCOSE 123* 117*  BUN 18 21  CREATININE 0.96 0.94  CALCIUM 9.0 8.9    Intake/Output Summary (Last 24 hours) at 04/30/2018 1129 Last data filed at 04/30/2018 0300 Gross per 24 hour  Intake 501.13 ml  Output -  Net 501.13 ml     Physical Exam: Vital Signs Blood pressure (!) 147/96, pulse (!) 57, temperature 97.6 F (36.4 C), resp. rate 18, height 6\' 2"  (1.88 m), weight 81 kg, SpO2 97 %. Constitutional: No distress . Vital signs reviewed. HENT: Normocephalic.  Atraumatic. Eyes: EOMI. No discharge. Cardiovascular: RRR. No JVD. Respiratory: CTA Bilaterally. Normal effort. GI: BS +. Non-distended. Musc: No edema or tenderness in extremities. Neurological: Alert, but not oriented.  Some difficulty following simple commands.  Motor: Bilateral upper extremities: Grossly 4/5.  Bilateral lower extremities: Grossly 4/5  Skin:Abrasions on both knees stable Psychiatric: Confused.    Assessment/Plan: 1. 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  Physiatrist is providing close team supervision and 24 hour management of active medical problems listed below.  Physiatrist and rehab team continue to assess barriers to discharge/monitor patient progress toward functional and medical goals  Care Tool:  Bathing    Body parts bathed by patient: Right arm,  Left arm, Chest, Abdomen, Front perineal area, Buttocks, Face, Left lower leg, Right lower leg, Left upper leg, Right upper leg         Bathing assist Assist Level: Contact Guard/Touching assist     Upper Body Dressing/Undressing Upper body dressing   What is the patient wearing?: Pull over shirt    Upper body assist Assist Level: Contact Guard/Touching assist(standing)    Lower Body Dressing/Undressing Lower body dressing      What is the patient wearing?: Underwear/pull up, Pants     Lower body assist Assist for lower body dressing: Contact Guard/Touching assist     Toileting Toileting    Toileting assist Assist for toileting: Contact Guard/Touching assist     Transfers Chair/bed transfer  Transfers assist     Chair/bed transfer assist level: Minimal Assistance - Patient > 75%     Locomotion Ambulation   Ambulation assist      Assist level: Minimal Assistance - Patient > 75% Assistive device: Walker-rolling Max distance: 150'   Walk 10 feet activity   Assist     Assist level: Minimal Assistance - Patient > 75% Assistive device: Walker-rolling   Walk 50 feet activity   Assist    Assist level: Minimal Assistance - Patient > 75% Assistive device: Walker-rolling    Walk 150 feet activity   Assist    Assist level: Minimal Assistance - Patient > 75% Assistive device: Walker-rolling    Walk 10 feet on uneven surface  activity   Assist Walk 10 feet on uneven surfaces activity  did not occur: Safety/medical concerns         Wheelchair     Assist Will patient use wheelchair at discharge?: No             Wheelchair 50 feet with 2 turns activity    Assist            Wheelchair 150 feet activity     Assist           Medical Problem List and Plan: 1.Acute on chronic gait multifactorial disorder/debility/possible sepsisin settingofpriorTBI and CVA  Continue CIR  Notes reviewed- debility with history  of TBI, images reviewed- CT head unremarkable for acute intracranial process, labs reviewed -focus on safety, equipment, family education 2. DVT Prophylaxis/Anticoagulation: Subcutaneous Lovenox.monitor for any bleeding episodes 3. Pain Management:Tylenol as needed 4. Mood:Zoloft 100 mg daily 5. Neuropsych: This patientisnot fully capable of making decisions on hisown behalf. 6. Skin/Wound Care:Routine skin checks 7. Fluids/Electrolytes/Nutrition:  -encourage PO  -added protein supp for low albumin   BMP within acceptable range on 12/27 8. ID. Complete 2 more days of Maxipime 9. Urinary retention/BPH. No voiding issues so far. 10. Hypertension. Avapro 150 mg daily, Coreg 3.125 mg twice a day.   ?  Trending up, will consider medication adjustments tomorrow if persistently elevated 11. Hyperlipidemia. Lipitor 12. Chronic constipation. MiraLAX scheduled. 13.  Prediabetes  Monitor with increased mobility    LOS: 2 days A FACE TO FACE EVALUATION WAS PERFORMED  Ankit Lorie Phenix 04/30/2018, 11:29 AM

## 2018-04-30 NOTE — Progress Notes (Signed)
Occupational Therapy Session Note  Patient Details  Name: Eric George MRN: [id number] Date of Birth: [birth year]  Today's Date: 04/30/2018 OT Individual Time: [id number] and [id number] OT Individual Time Calculation (min): 70 min and 59 min  Short Term Goals: Week 1:  OT Short Term Goal 1 (Week 1): STGs=LTGs due to ELOS  Skilled Therapeutic Interventions/Progress Updates:    Pt greeted in bed with spouse and RN present. Finishing up breakfast and receiving morning medication. Pt wanted to defer shower until PM OT session. Started tx with pt picking out his clothing for the day, ambulating with RW and steady assist to [employer]. He required demonstrational and tactile cues for draping clothing over device. Pt dressed sit<stand from recliner with steady assist. Able to problem solve independently when he threaded LEs into underwear before doffing PJ bottoms, and also to turn shirt right-side-in. When tying drawstrings to pants, instructed pt to do so in sitting vs standing to increase ease of task. Educated spouse on adaptive methods to don Teds. She sat in a chair with pts LE propped up on knee. Able to don 1 Ted stocking using bag method with pt actively assisting. Spouse has arthritis which makes this challenging without adaptations. Afterwards pt ambulated to toilet with mod vcs for DME mgt. Steady assist for clothing mgt x2 and successfully voided bladder void. Then he completed oral care/grooming tasks while standing at sink with min guard and cues for locating needed items. For remainder of session, worked on cognitive remediation, standing endurance, and DME safety via bedmaking. Pt side-stepped with RW Lt>Rt of bed to spread out sheet, blanket, and comforter. Min vcs for sequencing and spouse assisted on opposite side. Discussed with spouse use of simple cuing and allowing pt increased time to problem solve/process and do himself. She would benefit from more practice. Pt doffed/donned pillowcases  while sitting in recliner with min vcs for attention to task. Afterwards he returned to bed and was left with all needs within reach, spouse present, and bed alarm set.   2nd session 1:1 tx (59 min) Pt greeted in bed with spouse present. He was amenable to shower. Pt completed toileting (continent bladder void), bathing (at shower level), dressing (sit<stand from toilet with device), and grooming tasks (seated at sink) during session. All functional transfers completed using RW with steady assist and mod vcs for DME mgt/safety. Steady assist for standing balance in shower with pt sitting majority of time to wash. Min vcs for recognizing pants were on backwards. Pt able to remove them and correct error with steady assist for balance. He was also able to don B Ted hose with instruction and Min A for dynamic sitting balance. Spouse present to observe. Family education emphasis placed on simple cuing and giving pt opportunities to problem solve and self correct errors. Pt required 1 supine rest break afterwards due to fatigue. When he was ready, he transferred to chair placed at sink and used hair product to comb/style hair with setup. Pt transferred back to bed and was left with all needs, bed alarm set, and spouse present.    Therapy Documentation Precautions:  Precautions Precautions: Fall Restrictions Weight Bearing Restrictions: No Vital Signs: Therapy Vitals Temp: (!) 97.4 F (36.3 C) Pulse Rate: 68 Resp: 19 BP: 115/90 Oxygen Therapy SpO2: 97 % O2 Device: Room Air Pain: Pt denied pain during sessions    ADL: ADL Eating: Not assessed Grooming: Contact guard Where Assessed-Grooming: Standing at sink Upper Body Bathing: Contact guard Where Assessed-Upper Body  Bathing: Shower Lower Body Bathing: Contact guard Where Assessed-Lower Body Bathing: Shower Upper Body Dressing: Minimal assistance Where Assessed-Upper Body Dressing: Other (Comment)(sitting on toilet ) Lower Body Dressing:  Minimal assistance Where Assessed-Lower Body Dressing: Other (Comment)(sitting on toilet) Toileting: Not assessed Toilet Transfer: Minimal assistance Toilet Transfer Method: Counselling psychologist: Ambulance person Transfer: Minimal assistance Social research officer, government: Minimal assistance Social research officer, government Method: Heritage manager: Grab bars, Shower seat with back      Therapy/Group: Individual Therapy  Tayana Shankle A Sunshine Mackowski 04/30/2018, 1:07 PM

## 2018-04-30 NOTE — Progress Notes (Signed)
Physical Therapy Session Note  Patient Details  Name: Eric George MRN: [id number] Date of Birth: [birth year]  Today's Date: 04/30/2018 PT Individual Time: 1610-1705 PT Individual Time Calculation (min): 55 min   Short Term Goals: Week 1:  PT Short Term Goal 1 (Week 1): =LTG due to ELOS  Skilled Therapeutic Interventions/Progress Updates:   Pt received supine in bed and agreeable to PT. Supine>sit transfer with  Supervision assist and min cues for safety. Gait training through hall with CGA and RW x 125 ft to day room. Min cues for AD management in turns.     Nustep reciprocal movement training x 7 minutes, level 4>6 with min cues for proper speed to reduce fatigue and symmetrical movement R and L. Min cues for attention to target end time from PT.   Dynavision dynamic balance and problem solving training:  A on level surface 22, 30 A on airex pad. 26, 29 B on red wedge. With RUE red and LUE green: 21 and 17.  Supervision assist overall from PT to maintain balance while engaged in reaching at dynavision min cues from PT for use of proper UE and anterior weight shift from PT to prevent LOB.    Gait training without AD 2 x 172f with supervision-CGA assist from PT. Pt noted to have Lateral R hip instability with increasing trendelenburg gait pattern with fatigue. Min cues for improved posture, and a\symmetrical step length from PT. PT also instructed pt in gait training with RW x [id number] RN station, mild improvement in pelvic control on the R with use of RW compared to ano AD.   Patient returned to room and [facility] in reach and all needs met.        Therapy Documentation Precautions:  Precautions Precautions: Fall Restrictions Weight Bearing Restrictions: No   Pain: denies  Therapy/Group: Individual Therapy  AuLorie Phenix2/28/2019, 5:50 PM

## 2018-05-01 ENCOUNTER — Inpatient Hospital Stay (HOSPITAL_COMMUNITY): Payer: Medicare Other | Admitting: Occupational Therapy

## 2018-05-01 ENCOUNTER — Inpatient Hospital Stay (HOSPITAL_COMMUNITY): Payer: Medicare Other

## 2018-05-01 DIAGNOSIS — N390 Urinary tract infection, site not specified: Secondary | ICD-10-CM

## 2018-05-01 LAB — CULTURE, BLOOD (ROUTINE X 2)
Culture: NO GROWTH
Culture: NO GROWTH
Special Requests: ADEQUATE
Special Requests: ADEQUATE

## 2018-05-01 MED ORDER — IRBESARTAN 75 MG PO TABS
225.0000 mg | ORAL_TABLET | Freq: Every evening | ORAL | Status: DC
  Administered 2018-05-02: 225 mg via ORAL
  Filled 2018-05-01 (×2): qty 3

## 2018-05-01 NOTE — Progress Notes (Signed)
Occupational Therapy Session Note  Patient Details  Name: Eric George MRN: [id number] Date of Birth: [birth year]  Today's Date: 05/01/2018 OT Individual Time: 4332-9518 and 8416-6063 OT Individual Time Calculation (min): 70 min and 78 min  Short Term Goals: Week 1:  OT Short Term Goal 1 (Week 1): STGs=LTGs due to ELOS  Skilled Therapeutic Interventions/Progress Updates:    Pt greeted in bed with spouse and RN present. He opted not to shower this AM, wanting instead to shave and get dressed. Tx focus on DME mgt/safety, balance, and cognitive remediation during self care and meaningful tasks. Pt ambulated throughout session using RW with steady assist and vcs for walker mgt and widening BOS. Pt shaved and fixed hair with products while seated at sink with setup, and then he stood to complete oral care with close supervision for balance. Dressing completed sit<stand from recliner with min vcs for sustained attention to task, safety awareness, and method of donning B Teds (which he did with supervision). Steady assist for standing balance. After he was dressed, discussed with spouse concerns for home. She is worried about pt getting OOB at night to use the bathroom and she doesn't always wake up. She doesn't want to use BSC, and therefore OT educated her on use of bed pad monitor. We looked them up via online computer for [place] to see and price compare. Pt then ambulated to dayroom and engaged in golfing task using putting green to work on dynamic balance and standing endurance via meaningful leisure engagement. He required steady assist and two seated rest breaks. Pt smiling and actively participating. At end of session he ambulated back to room and transferred to bed. Pt left with spouse and bed alarm set.  2nd Session 1:1 tx (78 min) Pt greeted in bed with no c/o pain. Spouse present. Focused on d/c planning for home, DME safety/mgt and dynamic balance during functional tasks. Pt ambulated to  therapy apartment using RW with steady assist and 1 seated rest break. Started with vacuuming with focus on L LE weight shift/strengthening when guiding vacuum towards Lt side. He did not ambulate, but instead vacuumed in several spots while standing to work on proper hand placement and controlled descents to transfer surfaces (I.e. couch, recliner, w/c). Continued with DME mgt training via countertop washing/drying and side-stepping with RW and steady assist. Pt required mod vcs to keep walker on floor and to side step vs ambulate. Also practiced walk in shower transfer with 3-4 inch ledge (per home setup). Discussed and demonstrated proper technique with spouse and pt. Pt practiced this transfer twice with OT, and then once with spouse. Education emphasis placed on standing on Lt side whenever possible and also getting very close to shower entry before having pt turn. Also discussed having non-slip shower treads and installed grab bars to maximize safety. Pt then ambulated back to room in manner as written above. He transferred to recliner and was left with safety belt fastened and spouse present.   Therapy Documentation Precautions:  Precautions Precautions: Fall Restrictions Weight Bearing Restrictions: No Pain: Pt denied pain during tx sessions  Pain Assessment Pain Scale: 0-10 Pain Score: 0-No pain ADL: ADL Eating: Not assessed Grooming: Contact guard Where Assessed-Grooming: Standing at sink Upper Body Bathing: Contact guard Where Assessed-Upper Body Bathing: Shower Lower Body Bathing: Contact guard Where Assessed-Lower Body Bathing: Shower Upper Body Dressing: Minimal assistance Where Assessed-Upper Body Dressing: Other (Comment)(sitting on toilet ) Lower Body Dressing: Minimal assistance Where Assessed-Lower Body Dressing: Other (Comment)(sitting  on toilet) Toileting: Not assessed Toilet Transfer: Minimal assistance Toilet Transfer Method: Counselling psychologist:  Ambulance person Transfer: Environmental education officer: Environmental education officer Method: Heritage manager: Grab bars, Shower seat with back      Therapy/Group: Individual Therapy  Amaryah Mallen A Deno Sida 05/01/2018, 10:31 AM

## 2018-05-01 NOTE — Progress Notes (Addendum)
Physical Therapy Session Note  Patient Details  Name: Eric George MRN: [id number] Date of Birth: Apr 19, 1931  Today's Date: 05/01/2018 PT Individual Time: 0950-1030 PT Individual Time Calculation (min): 40 min   Short Term Goals: Week 1:  PT Short Term Goal 1 (Week 1): =LTG due to ELOS  Skilled Therapeutic Interventions/Progress Updates:   Pt asleep in bed, but awakened easily.  Wife present.  Supine> sit with difficulty, supervision, flat bed wihtout rails.  Pt donned bil shoes sitting EOB.  Sit> stand with supervision.  Gait training on level tile throughout unit with supervision, RW, x 150'.  Pt noted to have Trendelburg as well as delayed L knee extension, narrow BOs.   Discussed fitness for life with pt and wife.  Pt has very poor muscle development bil LEs, and would benefit from long-term strengthening exercises after PT is finished. Wife is open to seeking a fitness center with a personal trainer for pt.  neuromuscular re-education via forced use, multimodal cues for Kinetron in sitting x 25 cycles and standing, at level 50 cm/sec for static standing and slow reciprocal stepping.  Focus in standing is complete wt shift to L, hip and knee extension, which are quite delayed, with bil UE support> 1UE support for static standing on R/L.  Gait training without AD on level tile, x 100' with min> mod assist for LLE stance stability and clearance of LLE as he fatigued.  Pt might benefit from a heel lift in R shoe to assist with clearance of L foot when fatigue.  Transferred to recliner.  Pt left resting in recliner with bil LEs elevated, needs at hand, and set belt alarm set.  Wife present.     Therapy Documentation Precautions:  Precautions Precautions: Fall Restrictions Weight Bearing Restrictions: No Pain: Pain Assessment Pain Scale: 0-10 Pain Score: 0-No pain    Therapy/Group: Individual Therapy  [name] 05/01/2018, 10:40 AM

## 2018-05-01 NOTE — Progress Notes (Signed)
[place] PHYSICAL MEDICINE & REHABILITATION PROGRESS NOTE   Subjective/Complaints: Patient seen laying in bed this morning.  He states he slept well overnight.  Wife states therapies are going well.  ROS: Limited due to cognition  Objective:   No results found. Recent Labs    04/29/18 0529  WBC 9.5  HGB 13.5  HCT 40.7  PLT 206   Recent Labs    04/29/18 0529  NA 142  K 4.0  CL 109  CO2 23  GLUCOSE 117*  BUN 21  CREATININE 0.94  CALCIUM 8.9    Intake/Output Summary (Last 24 hours) at 05/01/2018 2024 Last data filed at 05/01/2018 1803 Gross per 24 hour  Intake 720 ml  Output -  Net 720 ml     Physical Exam: Vital Signs Blood pressure (!) 155/95, pulse 84, temperature 98.2 F (36.8 C), resp. rate 18, height 6\' 2"  (1.88 m), weight 81 kg, SpO2 97 %. Constitutional: No distress . Vital signs reviewed. HENT: Normocephalic.  Atraumatic. Eyes: EOMI. No discharge. Cardiovascular: RRR.  No JVD. Respiratory: CTA bilaterally.  Normal effort. GI: BS +. Non-distended. Musc: No edema or tenderness in extremities. Neurological: Alert, but not oriented.  Some difficulty following simple commands.  Motor: Bilateral upper extremities: Grossly 4/5, stable.  Bilateral lower extremities: Grossly 4/5, stable Skin:Abrasions on both knees stable Psychiatric: Confused.    Assessment/Plan: 1. 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  Physiatrist is providing close team supervision and 24 hour management of active medical problems listed below.  Physiatrist and rehab team continue to assess barriers to discharge/monitor patient progress toward functional and medical goals  Care Tool:  Bathing    Body parts bathed by patient: Right arm, Left arm, Chest, Abdomen, Front perineal area, Buttocks, Face, Left lower leg, Right lower leg, Left upper leg, Right upper leg          Bathing assist Assist Level: Contact Guard/Touching assist     Upper Body Dressing/Undressing Upper body dressing   What is the patient wearing?: Pull over shirt    Upper body assist Assist Level: Supervision/Verbal cueing    Lower Body Dressing/Undressing Lower body dressing      What is the patient wearing?: Underwear/pull up, Pants     Lower body assist Assist for lower body dressing: Contact Guard/Touching assist     Toileting Toileting    Toileting assist Assist for toileting: Supervision/Verbal cueing     Transfers Chair/bed transfer  Transfers assist     Chair/bed transfer assist level: Supervision/Verbal cueing     Locomotion Ambulation   Ambulation assist      Assist level: Contact Guard/Touching assist Assistive device: Walker-rolling Max distance: 150'   Walk 10 feet activity   Assist     Assist level: Contact Guard/Touching assist Assistive device: Walker-rolling   Walk 50 feet activity   Assist    Assist level: Contact Guard/Touching assist Assistive device: Walker-rolling    Walk 150 feet activity   Assist    Assist level: Contact Guard/Touching assist Assistive device: Walker-rolling    Walk 10 feet on uneven surface  activity   Assist Walk 10 feet on uneven surfaces activity did not occur: Safety/medical concerns         Wheelchair     Assist Will patient use wheelchair at discharge?: No             Wheelchair 50 feet with 2  turns activity    Assist            Wheelchair 150 feet activity     Assist           Medical Problem List and Plan: 1.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 DVT Prophylaxis/Anticoagulation: Subcutaneous Lovenox.monitor for any bleeding episodes 3. Pain Management:Tylenol as needed 4. Mood:Zoloft 100 mg daily 5. Neuropsych:  This patientisnot fully capable of making decisions on hisown behalf. 6. Skin/Wound Care:Routine skin checks 7. Fluids/Electrolytes/Nutrition:  -encourage PO  -added protein supp for low albumin   BMP within acceptable range on 12/27 8. ID. Complete 1 more days of Maxipime 9. Urinary retention/BPH. No voiding issues so far. 10. Hypertension.   Avapro 150 mg daily, increased to 225 on 12/30  Coreg 3.125 mg twice a day.   ?  Trending up, will consider medication adjustments tomorrow if persistently elevated 11. Hyperlipidemia. Lipitor 12. Chronic constipation. MiraLAX scheduled. 13.  Prediabetes  Elevated on 12/27  Monitor with increased mobility    LOS: 3 days A FACE TO FACE EVALUATION WAS PERFORMED  Ankit Lorie Phenix 05/01/2018, 8:24 PM

## 2018-05-02 ENCOUNTER — Inpatient Hospital Stay (HOSPITAL_COMMUNITY): Payer: Medicare Other | Admitting: Occupational Therapy

## 2018-05-02 ENCOUNTER — Inpatient Hospital Stay (HOSPITAL_COMMUNITY): Payer: Medicare Other | Admitting: Physical Therapy

## 2018-05-02 ENCOUNTER — Inpatient Hospital Stay (HOSPITAL_COMMUNITY): Payer: Medicare Other

## 2018-05-02 MED ORDER — LOPERAMIDE HCL 2 MG PO CAPS
2.0000 mg | ORAL_CAPSULE | ORAL | Status: DC | PRN
  Administered 2018-05-02 – 2018-05-03 (×4): 2 mg via ORAL
  Filled 2018-05-02 (×5): qty 1

## 2018-05-02 MED ORDER — ASPIRIN 325 MG PO TABS
325.0000 mg | ORAL_TABLET | Freq: Every day | ORAL | Status: DC
  Administered 2018-05-02 – 2018-05-05 (×4): 325 mg via ORAL
  Filled 2018-05-02 (×4): qty 1

## 2018-05-02 MED ORDER — SACCHAROMYCES BOULARDII 250 MG PO CAPS
250.0000 mg | ORAL_CAPSULE | Freq: Two times a day (BID) | ORAL | Status: DC
  Administered 2018-05-02 – 2018-05-05 (×7): 250 mg via ORAL
  Filled 2018-05-02 (×7): qty 1

## 2018-05-02 MED ORDER — HYDROCORTISONE-ACETIC ACID 1-2 % OT SOLN
5.0000 [drp] | OTIC | Status: DC
  Administered 2018-05-02: 5 [drp] via OTIC

## 2018-05-02 NOTE — Progress Notes (Signed)
Physical Therapy Session Note  Patient Details  Name: Eric George MRN: [id number] Date of Birth: 1930/10/12  Today's Date: 05/02/2018 PT Individual Time: 0900-1000; 1415-1445 PT Individual Time Calculation (min): 60 min and 30 min  Short Term Goals: Week 1:  PT Short Term Goal 1 (Week 1): =LTG due to ELOS  Skilled Therapeutic Interventions/Progress Updates:    Session 1: Pt received seated in bed, agreeable to PT. No complaints of pain but pt reports frequent loose stools this AM and feeling very fatigued. Bed mobility with Supervision. Sit to stand with Supervision. Ambulation to bathroom with RW and CGA for balance. Toilet transfer with CGA. V/c for safety with gait for safe RW management. Pt is setup A for 3/3 toileting steps. Ambulation x 150 ft with RW and CGA, Trendelenburg on L side as well as decreased LLE clearance. Added R heel lift to shoe. Ambulation x 150 ft with RW and CGA, improvement in LLE clearance with heel lift in place. Side-steps L/R 2 x 10 ft with RW and CGA for balance for B hip strengthening. Pt requests to use the bathroom again. See above for assist level with toileting and transfers. Sit to supine with Supervision. Supine bridges 2 x 10 reps for glute strengthening. Pt left seated in bed with needs in reach, bed alarm in place, wife present.  Session 2: Pt received supine in bed, agreeable with encouragement to participate in therapy session. Pt appears more fatigued this PM but is agreeable to participate. Pt's wife [name] present during therapy session for hands-on family training. Supine to sit with Supervision. Sit to stand with Supervision to RW. Ambulation 2 x 150 ft with RW and CGA. Pt's wife observes therapist providing Supervision for bed mobility and transfers and then demonstrates good safety and assist when helping patient. Pt's wife cleared to assist pt from bed to the bathroom with RW, safety plan updated. Pt left supine in bed with needs in reach, bed  alarm in place, wife present.  Therapy Documentation Precautions:  Precautions Precautions: Fall Restrictions Weight Bearing Restrictions: No   Therapy/Group: Individual Therapy  Excell Seltzer, PT, DPT  05/02/2018, 12:11 PM

## 2018-05-02 NOTE — Progress Notes (Signed)
Occupational Therapy Session Note  Patient Details  Name: Eric George MRN: [id number] Date of Birth: 08/19/1930  Today's Date: 05/02/2018 OT Individual Time: 0938-1829 OT Individual Time Calculation (min): 57 min   Short Term Goals: Week 1:  OT Short Term Goal 1 (Week 1): STGs=LTGs due to ELOS  Skilled Therapeutic Interventions/Progress Updates:    Pt greeted in bed with spouse and RN present. He had a rough night, up multiple times with BMs per spouse and RN. With encouragement, pt agreeable to tx, however required increased cuing for DME safety, hand placement during stand<sit transitions, sequencing, and to sit vs stand during appropriate portions of ADL today. He completed bathing (at shower level), dressing (sit<stand from toilet), toileting, and grooming tasks (sitting at sink) during session. All functional transfers completed using RW with steady assist at ambulatory level. Steady assist for standing balance in shower during perihygiene completion and also when dressing sit<stand from toilet. Able to don Teds with Min A from OT. Pt with continent B+B void, able to complete hygiene while sitting on toilet with close supervision for balance. Grooming tasks completed while seated to conserve energy, as pt very fatigued at this point. Afterwards he returned to bed and was left with spouse and bed alarm set.  Discussed with spouse benefits of having TTB for home shower so that pt could laterally lean for perihygiene, and therefore increase safety. [name] reports shower is most likely large enough to accommodate TTB, however she's still considering shower chair option.     Therapy Documentation Precautions:  Precautions Precautions: Fall Restrictions Weight Bearing Restrictions: No Pain: No c/o pain, just fatigue from lack of sleep  ADL: ADL Eating: Not assessed Grooming: Contact guard Where Assessed-Grooming: Standing at sink Upper Body Bathing: Contact guard Where Assessed-Upper  Body Bathing: Shower Lower Body Bathing: Contact guard Where Assessed-Lower Body Bathing: Shower Upper Body Dressing: Minimal assistance Where Assessed-Upper Body Dressing: Other (Comment)(sitting on toilet ) Lower Body Dressing: Minimal assistance Where Assessed-Lower Body Dressing: Other (Comment)(sitting on toilet) Toileting: Not assessed Toilet Transfer: Minimal assistance Toilet Transfer Method: Counselling psychologist: Ambulance person Transfer: Minimal assistance Social research officer, government: Minimal assistance Social research officer, government Method: Heritage manager: Grab bars, Shower seat with back      Therapy/Group: Individual Therapy  Bernadene Garside A Danyael Alipio 05/02/2018, 12:33 PM

## 2018-05-02 NOTE — Progress Notes (Signed)
[facility] PHYSICAL MEDICINE & REHABILITATION PROGRESS NOTE   Subjective/Complaints: Had three very loose stools overnight into this morning. Low gade temp. Denies pain, fever/chills  ROS: Limited due to cognitive/behavioral    Objective:   No results found. No results for input(s): WBC, HGB, HCT, PLT in the last 72 hours. No results for input(s): NA, K, CL, CO2, GLUCOSE, BUN, CREATININE, CALCIUM in the last 72 hours.  Intake/Output Summary (Last 24 hours) at 05/02/2018 0850 Last data filed at 05/01/2018 1803 Gross per 24 hour  Intake 240 ml  Output -  Net 240 ml     Physical Exam: Vital Signs Blood pressure (!) 155/97, pulse 78, temperature 99.4 F (37.4 C), temperature source Oral, resp. rate 18, height 6\' 2"  (1.88 m), weight 81 kg, SpO2 95 %. Constitutional: No distress . Vital signs reviewed. HEENT: EOMI, oral membranes moist Neck: supple Cardiovascular: RRR without murmur. No JVD    Respiratory: CTA Bilaterally without wheezes or rales. Normal effort    GI: BS +, non-tender, non-distended  Musc: No edema or tenderness in extremities. Neurological: Alert,oriented to person/hospital Some difficulty following simple commands.  Motor: Bilateral upper extremities: Grossly 4/5, stable.  Bilateral lower extremities: Grossly 4/5, stable Skin:Abrasions on both knees stable Psychiatric: poor memory    Assessment/Plan: 1. 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  Physiatrist is providing close team supervision and 24 hour management of active medical problems listed below.  Physiatrist and rehab team continue to assess barriers to discharge/monitor patient progress toward functional and medical goals  Care Tool:  Bathing    Body parts bathed by patient: Right arm, Left arm, Chest, Abdomen, Front perineal area, Buttocks, Face, Left lower leg, Right lower  leg, Left upper leg, Right upper leg         Bathing assist Assist Level: Contact Guard/Touching assist     Upper Body Dressing/Undressing Upper body dressing   What is the patient wearing?: Pull over shirt    Upper body assist Assist Level: Supervision/Verbal cueing    Lower Body Dressing/Undressing Lower body dressing      What is the patient wearing?: Underwear/pull up, Pants     Lower body assist Assist for lower body dressing: Contact Guard/Touching assist     Toileting Toileting    Toileting assist Assist for toileting: Contact Guard/Touching assist     Transfers Chair/bed transfer  Transfers assist     Chair/bed transfer assist level: Supervision/Verbal cueing     Locomotion Ambulation   Ambulation assist      Assist level: Contact Guard/Touching assist Assistive device: Walker-rolling Max distance: 150'   Walk 10 feet activity   Assist     Assist level: Contact Guard/Touching assist Assistive device: Walker-rolling   Walk 50 feet activity   Assist    Assist level: Contact Guard/Touching assist Assistive device: Walker-rolling    Walk 150 feet activity   Assist    Assist level: Contact Guard/Touching assist Assistive device: Walker-rolling    Walk 10 feet on uneven surface  activity   Assist Walk 10 feet on uneven surfaces activity did not occur: Safety/medical concerns         Wheelchair     Assist Will patient use wheelchair at discharge?: No             Wheelchair 50 feet with 2 turns activity    Assist  Wheelchair 150 feet activity     Assist           Medical Problem List and Plan: 1.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 DVT Prophylaxis/Anticoagulation: Subcutaneous Lovenox.monitor for any bleeding episodes 3. Pain Management:Tylenol as needed 4.  Mood:Zoloft 100 mg daily 5. Neuropsych: This patientisnot fully capable of making decisions on hisown behalf. 6. Skin/Wound Care:Routine skin checks 7. Fluids/Electrolytes/Nutrition:  -encourage PO  -added protein supp for low albumin   BMP within acceptable range on 12/27 8. ID. Maxipime complete 9. Urinary retention/BPH. No voiding issues so far. 10. Hypertension.   Avapro 150 mg daily, increased to 225 on 12/30  Coreg 3.125 mg twice a day.   Follow for trend after increased avapro 11. Hyperlipidemia. Lipitor 12. Chronic constipation. 13.  Prediabetes  Elevated on 12/27  Monitor with increased mobility 14. Loose stools:  -likely due to abx  -hold miralax  -imodium/probiotics  -has low grade temp ---observe    LOS: 4 days A FACE TO FACE EVALUATION WAS PERFORMED  Meredith Staggers 05/02/2018, 8:50 AM

## 2018-05-02 NOTE — Plan of Care (Signed)
  Problem: Consults Goal: RH BRAIN INJURY PATIENT EDUCATION Description Description: See Patient Education module for eduction specifics Outcome: Progressing Goal: Skin Care Protocol Initiated - if Braden Score 18 or less Description If consults are not indicated, leave blank or document N/A Outcome: Progressing Goal: Nutrition Consult-if indicated Outcome: Progressing   Problem: RH BOWEL ELIMINATION Goal: RH STG MANAGE BOWEL WITH ASSISTANCE Description STG Manage Bowel with supervision Assistance.  Outcome: Progressing   Problem: RH BLADDER ELIMINATION Goal: RH STG MANAGE BLADDER WITH ASSISTANCE Description STG Manage Bladder With supervision Assistance  Outcome: Progressing   Problem: RH SKIN INTEGRITY Goal: RH STG SKIN FREE OF INFECTION/BREAKDOWN Description Skin to remain free from infection/breakdown while on rehab with supervision assistance.  Outcome: Progressing Goal: RH STG MAINTAIN SKIN INTEGRITY WITH ASSISTANCE Description STG Maintain Skin Integrity With min Assistance.  Outcome: Progressing Goal: RH STG ABLE TO PERFORM INCISION/WOUND CARE W/ASSISTANCE Description STG Able To Perform Incision/Wound Care With min Assistance.  Outcome: Progressing   Problem: RH SAFETY Goal: RH STG ADHERE TO SAFETY PRECAUTIONS W/ASSISTANCE/DEVICE Description STG Adhere to Safety Precautions With min Assistance and appropriate assistive Device.  Outcome: Progressing   Problem: RH COGNITION-NURSING Goal: RH STG USES MEMORY AIDS/STRATEGIES W/ASSIST TO PROBLEM SOLVE Description STG Uses Memory Aids and Strategies With min Assistance to Problem Solve.  Outcome: Progressing   Problem: RH PAIN MANAGEMENT Goal: RH STG PAIN MANAGED AT OR BELOW PT'S PAIN GOAL Description < 3 on a 0-10 pain scale  Outcome: Progressing   Problem: RH KNOWLEDGE DEFICIT BRAIN INJURY Goal: RH STG INCREASE KNOWLEDGE OF SELF CARE AFTER BRAIN INJURY Description Caregiver will increase knowledge of  medications, transfers, and ADL's to assist patient at discharge.  Outcome: Progressing

## 2018-05-02 NOTE — Progress Notes (Signed)
Occupational Therapy Session Note  Patient Details  Name: Eric George MRN: [id number] Date of Birth: [birth year]  Today's Date: 05/02/2018 OT Individual Time: 1300-1345 OT Individual Time Calculation (min): 45 min    Short Term Goals: Week 1:  OT Short Term Goal 1 (Week 1): STGs=LTGs due to ELOS  Skilled Therapeutic Interventions/Progress Updates:    Session focused on toileting tasks, functional mobility, RW management, and dynamic standing balance. Pt c/o fatigue from frequent BM today but no pain and agreeable to session. Pt completed functional mobility with RW into bathroom, completing all toileting tasks at (S) level. CGA provided during 100 ft of functional mobility x3 during session. Attempted to use Wii with pt to address dynamic standing balance and standing tolerance, but pt unable to sequence through steps of game. Pt did however, stand unsupported for 5+ minutes with CGA. Pt then completed functional reaching with RW while in a tight space, requiring moderate cueing for RW management and postioning. Pt returned to room and left supine with bed alarm set and wife present.   Therapy Documentation Precautions:  Precautions Precautions: Fall Restrictions Weight Bearing Restrictions: No Vital Signs: Therapy Vitals Temp: 99.4 F (37.4 C) Temp Source: Oral Pulse Rate: (!) 102 Resp: 18 BP: (!) 155/97 Patient Position (if appropriate): Lying Oxygen Therapy SpO2: 95 % O2 Device: Room Air Pain:     Therapy/Group: Individual Therapy  [name] 05/02/2018, 7:26 AM

## 2018-05-03 ENCOUNTER — Inpatient Hospital Stay (HOSPITAL_COMMUNITY): Payer: Medicare Other | Admitting: Occupational Therapy

## 2018-05-03 ENCOUNTER — Inpatient Hospital Stay (HOSPITAL_COMMUNITY): Payer: Medicare Other | Admitting: Physical Therapy

## 2018-05-03 DIAGNOSIS — R195 Other fecal abnormalities: Secondary | ICD-10-CM

## 2018-05-03 LAB — CBC
HCT: 40.6 % (ref 39.0–52.0)
Hemoglobin: 13.3 g/dL (ref 13.0–17.0)
MCH: 28.9 pg (ref 26.0–34.0)
MCHC: 32.8 g/dL (ref 30.0–36.0)
MCV: 88.1 fL (ref 80.0–100.0)
Platelets: 202 10*3/uL (ref 150–400)
RBC: 4.61 MIL/uL (ref 4.22–5.81)
RDW: 13 % (ref 11.5–15.5)
WBC: 9.3 10*3/uL (ref 4.0–10.5)
nRBC: 0 % (ref 0.0–0.2)

## 2018-05-03 LAB — BASIC METABOLIC PANEL
Anion gap: 8 (ref 5–15)
BUN: 23 mg/dL (ref 8–23)
CO2: 20 mmol/L — ABNORMAL LOW (ref 22–32)
Calcium: 9.5 mg/dL (ref 8.9–10.3)
Chloride: 110 mmol/L (ref 98–111)
Creatinine, Ser: 1.06 mg/dL (ref 0.61–1.24)
GFR calc Af Amer: >60 mL/min (ref >60)
GFR calc non Af Amer: >60 mL/min (ref >60)
Glucose, Bld: 147 mg/dL — ABNORMAL HIGH (ref 70–99)
Potassium: 3.5 mmol/L (ref 3.5–5.1)
Sodium: 138 mmol/L (ref 135–145)

## 2018-05-03 NOTE — Progress Notes (Signed)
[facility] PHYSICAL MEDICINE & REHABILITATION PROGRESS NOTE   Subjective/Complaints: Had two more loose stools this am. No odor, no fever, no abdominal pain or nausea. Appetite is good. Pt states he feels well today  ROS: Patient denies fever, rash, sore throat, blurred vision, nausea, vomiting,  cough, shortness of breath or chest pain, joint or back pain, headache, or mood change.   Objective:   No results found. No results for input(s): WBC, HGB, HCT, PLT in the last 72 hours. No results for input(s): NA, K, CL, CO2, GLUCOSE, BUN, CREATININE, CALCIUM in the last 72 hours.  Intake/Output Summary (Last 24 hours) at 05/03/2018 0840 Last data filed at 05/03/2018 0806 Gross per 24 hour  Intake 1080 ml  Output -  Net 1080 ml     Physical Exam: Vital Signs Blood pressure (!) 152/68, pulse 74, temperature 98.4 F (36.9 C), resp. rate 19, height 6\' 2"  (1.88 m), weight 81 kg, SpO2 98 %. Constitutional: No distress . Vital signs reviewed. HEENT: EOMI, oral membranes moist Neck: supple Cardiovascular: RRR without murmur. No JVD    Respiratory: CTA Bilaterally without wheezes or rales. Normal effort    GI: BS +, non-tender, non-distended  Musc: No edema or tenderness in extremities. Neurological: Alert,oriented to person/hospital only. HOH Follows simple commands Motor: Bilateral upper extremities: Grossly 4/5, stable.  Bilateral lower extremities: Grossly 4/5, stable Skin:Abrasions on both knees stable Psychiatric: very pleasant    Assessment/Plan: 1. 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  Physiatrist is providing close team supervision and 24 hour management of active medical problems listed below.  Physiatrist and rehab team continue to assess barriers to discharge/monitor patient progress toward functional and medical goals  Care Tool:  Bathing    Body  parts bathed by patient: Right arm, Left arm, Chest, Abdomen, Front perineal area, Buttocks, Face, Left lower leg, Right lower leg, Left upper leg, Right upper leg         Bathing assist Assist Level: Contact Guard/Touching assist     Upper Body Dressing/Undressing Upper body dressing   What is the patient wearing?: Pull over shirt    Upper body assist Assist Level: Supervision/Verbal cueing    Lower Body Dressing/Undressing Lower body dressing      What is the patient wearing?: Underwear/pull up, Pants     Lower body assist Assist for lower body dressing: Contact Guard/Touching assist     Toileting Toileting    Toileting assist Assist for toileting: Supervision/Verbal cueing     Transfers Chair/bed transfer  Transfers assist     Chair/bed transfer assist level: Supervision/Verbal cueing     Locomotion Ambulation   Ambulation assist      Assist level: Contact Guard/Touching assist Assistive device: Walker-rolling Max distance: 150'   Walk 10 feet activity   Assist     Assist level: Contact Guard/Touching assist Assistive device: Walker-rolling   Walk 50 feet activity   Assist    Assist level: Contact Guard/Touching assist Assistive device: Walker-rolling    Walk 150 feet activity   Assist    Assist level: Contact Guard/Touching assist Assistive device: Walker-rolling    Walk 10 feet on uneven surface  activity   Assist Walk 10 feet on uneven surfaces activity did not occur: Safety/medical concerns         Wheelchair     Assist Will patient use wheelchair at discharge?: No  Wheelchair 50 feet with 2 turns activity    Assist            Wheelchair 150 feet activity     Assist           Medical Problem List and Plan: 1.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 DVT Prophylaxis/Anticoagulation:  Subcutaneous Lovenox.monitor for any bleeding episodes 3. Pain Management:Tylenol as needed 4. Mood:Zoloft 100 mg daily 5. Neuropsych: This patientisnot fully capable of making decisions on hisown behalf. 6. Skin/Wound Care:Routine skin checks 7. Fluids/Electrolytes/Nutrition:  -encourage PO  -added protein supp for low albumin   BMP within acceptable range on 12/27---recheck today 8. ID. Maxipime complete 9. Urinary retention/BPH. No voiding issues so far. 10. Hypertension. Discussed with wife. Unclear why bp has been elevated  Avapro 150 mg daily, increased to 225 on 12/30 and demonstrating some improvement  Coreg 3.125 mg twice a day.   Continue to monitor after increased avapro 11. Hyperlipidemia. Lipitor 12. Chronic constipation. 13.  Prediabetes  Elevated on 12/27  Monitor with increased mobility 14. Loose stools: 2 this morning, were formed yesterday afternoon  -likely due to abx  -miralax held  -imodium/probiotics  -no odor, temp, abdominal pain. Appetite is good  -check cbc,bmet today    LOS: 5 days A FACE TO FACE EVALUATION WAS PERFORMED  Meredith Staggers 05/03/2018, 8:40 AM

## 2018-05-03 NOTE — Progress Notes (Signed)
Physical Therapy Session Note  Patient Details  Name: Eric George MRN: [id number] Date of Birth: [birth year]  Today's Date: 05/03/2018 PT Individual Time: 1100-1200; 1400-1500 PT Individual Time Calculation (min): 60 min and 60 min  Short Term Goals: Week 1:  PT Short Term Goal 1 (Week 1): =LTG due to ELOS  Skilled Therapeutic Interventions/Progress Updates:    Session 1:  Pt received standing at sink washing hands with Supervision from wife [name]. Pt agreeable to PT, no complaints of pain. Pt transfers with Supervision throughout therapy session with cues for safe hand placement during transfers. Ambulation x 150 ft with RW and close SBA to CGA, v/c for increased BLE clearance with gait and for wider BOS. Ambulation with RW across carpet to simulate home environment, CGA. V/c for safety when crossing thresholds to different surfaces, CGA for balance with RW. Focus on stepping over obstacles with RW and safe RW management, CGA. Car transfer with close SBA to CGA for balance, v/c for safe transfer technique and safe hand placement on RW with transfer. Sidesteps L/R 2 x 10 ft with RW and CGA for B hip strengthening. Nustep level 4 x 10 min with B UE/LE for global endurance. Pt left seated in recliner chair in room setup for lunch, wife present.  Session 2: Pt received seated in bed, agreeable to PT. No complaints of pain. Bed mobility Supervision. Pt transfers with Supervision throughout therapy session. Trial ambulation with no AD and min A, pt exhibits increase in ataxia, occasional scissoring of gait, and increase in Trendelenburg on L side with use of no AD. Pt is close SBA for gait with RW this PM. Ascend/descend one curb with RW and CGA, demonstrated assisting pt then had pt's wife assist him with curb. Ascend/descend 8 stairs with 1-2 handrails and CGA for balance, v/c for safety. Toilet transfer with Supervision, pt is setup A for 3/3 toileting steps. Static standing balance: horseshoe toss  with no UE support and CGA for balance; ball toss with no UE support reaching outside BOS for ball, close SBA for balance. Pt left seated in recliner in room with needs in reach and wife present at end of therapy session.  Therapy Documentation Precautions:  Precautions Precautions: Fall Restrictions Weight Bearing Restrictions: No   Therapy/Group: Individual Therapy  Excell Seltzer, PT, DPT  05/03/2018, 12:22 PM

## 2018-05-03 NOTE — Progress Notes (Signed)
Occupational Therapy Session Note  Patient Details  Name: Eric George MRN: [id number] Date of Birth: 1930-11-29  Today's Date: 05/03/2018 OT Individual Time: 1740-8144 OT Individual Time Calculation (min): 57 min  and Today's Date: 05/03/2018 OT Missed Time: 18 Minutes Missed Time Reason: Patient ill (comment)   Short Term Goals: Week 1:  OT Short Term Goal 1 (Week 1): STGs=LTGs due to ELOS  Skilled Therapeutic Interventions/Progress Updates:    Upon entering the room, pt seated in recliner chair with wife present in room. She reports pt had multiple bouts of diarrhea last night and did not sleep well. Session focused on hand on family education with bathing and dressing tasks. Caregiver providing cuing for safety and min guard assist for pt to ambulate into bathroom with RW for toileting needs. Pt doffing clothing with min cuing for sequence and initiation from caregiver while seated. Min guard for stand pivot transfer onto shower seat. Pt asking caregiver, " What do I do?" She gave cuing to pt and asking him to remain seated until standing to wash buttocks with min guard for safety with standing balance. Pt exiting the shower to don clothing items from EOB. Caregiver continued to provide assist with balance and cuing for technique and sequencing without assistance from therapist. Pt reports feeling nauseated. RN notified. Pt returned to bed with steady assistance for stand pivot transfer. OT encouraged fluids and bed alarm activated with call bell within reach. RN notified. Missed 18 minutes secondary to nausea.   Therapy Documentation Precautions:  Precautions Precautions: Fall Restrictions Weight Bearing Restrictions: No General: General OT Amount of Missed Time: 18 Minutes Vital Signs: Therapy Vitals Temp: 98.4 F (36.9 C) Pulse Rate: 74 Resp: 19 BP: (!) 152/68 Patient Position (if appropriate): Lying Oxygen Therapy SpO2: 98 % O2 Device: Room Air Pain:    ADL: ADL Eating: Not assessed Grooming: Contact guard Where Assessed-Grooming: Standing at sink Upper Body Bathing: Contact guard Where Assessed-Upper Body Bathing: Shower Lower Body Bathing: Contact guard Where Assessed-Lower Body Bathing: Shower Upper Body Dressing: Minimal assistance Where Assessed-Upper Body Dressing: Other (Comment)(sitting on toilet ) Lower Body Dressing: Minimal assistance Where Assessed-Lower Body Dressing: Other (Comment)(sitting on toilet) Toileting: Not assessed Toilet Transfer: Minimal assistance Toilet Transfer Method: Counselling psychologist: Ambulance person Transfer: Minimal assistance Social research officer, government: Minimal assistance Social research officer, government Method: Heritage manager: Grab bars, Shower seat with back   Therapy/Group: Individual Therapy  Gypsy Decant 05/03/2018, 9:32 AM

## 2018-05-04 ENCOUNTER — Inpatient Hospital Stay (HOSPITAL_COMMUNITY): Payer: Medicare Other

## 2018-05-04 ENCOUNTER — Inpatient Hospital Stay (HOSPITAL_COMMUNITY): Payer: Medicare Other | Admitting: Occupational Therapy

## 2018-05-04 ENCOUNTER — Inpatient Hospital Stay (HOSPITAL_COMMUNITY): Payer: Medicare Other | Admitting: Physical Therapy

## 2018-05-04 MED ORDER — IRBESARTAN 75 MG PO TABS
150.0000 mg | ORAL_TABLET | Freq: Every evening | ORAL | Status: DC
  Administered 2018-05-04: 150 mg via ORAL
  Filled 2018-05-04: qty 2

## 2018-05-04 NOTE — Progress Notes (Signed)
Occupational Therapy Discharge Summary  Patient Details  Name: Eric George MRN: [id number] Date of Birth: 10-09-1930   Patient has met 11 of 11 long term goals due to improved activity tolerance, improved balance, postural control and improved coordination.  Patient to discharge at overall Supervision level.  Patient's care partner is independent to provide the necessary physical assistance at discharge.  Pt's wife has completed hands on family education and demonstrates willing and ableness to provide the needed assistance at d/c.  Pt declining bathing/dressing routine on last day of therapy. Per pt report, completed all basic ADLs with supervision.   Recommendation:  Patient with no further OT needs. Will receive outpatient PT to cont to address functional deficits  Equipment: RW and shower chair  Reasons for discharge: treatment goals met and discharge from hospital  Patient/family agrees with progress made and goals achieved: Yes  OT Discharge Precautions/Restrictions  Precautions Precautions: Fall Restrictions Weight Bearing Restrictions: No Vision Baseline Vision/History: Wears glasses Wears Glasses: At all times Patient Visual Report: No change from baseline Vision Assessment?: No apparent visual deficits Eye Alignment: Within Functional Limits Perception  Perception: Within Functional Limits Praxis Praxis: Intact Cognition Overall Cognitive Status: History of cognitive impairments - at baseline Arousal/Alertness: Awake/alert Orientation Level: Oriented X4 Focused Attention: Appears intact Sustained Attention: Impaired Memory: Impaired Awareness: Impaired Awareness Impairment: Emergent impairment Problem Solving: Impaired Safety/Judgment: Impaired Comments: cognitive defiicts at baseline per pt's spouse Sensation Sensation Light Touch: Appears Intact Proprioception: Appears Intact Coordination Gross Motor Movements are Fluid and Coordinated: No Fine  Motor Movements are Fluid and Coordinated: Yes Coordination and Movement Description: impaired by generalized weakness Finger Nose Finger Test: WNL B UEs Heel Shin Test: decreased speed B Motor  Motor Motor: Within Functional Limits Motor - Discharge Observations: Generalized deconditioning Mobility  Bed Mobility Bed Mobility: Rolling Right;Rolling Left;Supine to Sit;Sit to Supine Rolling Right: Independent Rolling Left: Independent Supine to Sit: Independent Sit to Supine: Independent Transfers Sit to Stand: Supervision/Verbal cueing Stand to Sit: Supervision/Verbal cueing  Trunk/Postural Assessment  Cervical Assessment Cervical Assessment: Exceptions to WFL(Forward head) Thoracic Assessment Thoracic Assessment: Exceptions to WFL(Kyphotic) Lumbar Assessment Lumbar Assessment: Exceptions to WFL(Posterior pelvic tilt) Postural Control Postural Control: Deficits on evaluation Righting Reactions: delayed  Balance Balance Balance Assessed: Yes Standardized Balance Assessment Standardized Balance Assessment: Timed Up and Go Test Berg Balance Test Sit to Stand: Able to stand  independently using hands Standing Unsupported: Able to stand 2 minutes with supervision Sitting with Back Unsupported but Feet Supported on Floor or Stool: Able to sit safely and securely 2 minutes Stand to Sit: Controls descent by using hands Transfers: Able to transfer safely, definite need of hands Standing Unsupported with Eyes Closed: Able to stand 10 seconds with supervision Standing Ubsupported with Feet Together: Able to place feet together independently and stand for 1 minute with supervision From Standing, Reach Forward with Outstretched Arm: Reaches forward but needs supervision From Standing Position, Pick up Object from Floor: Able to pick up shoe, needs supervision From Standing Position, Turn to Look Behind Over each Shoulder: Turn sideways only but maintains balance Turn 360 Degrees: Needs  close supervision or verbal cueing Standing Unsupported, Alternately Place Feet on Step/Stool: Able to stand independently and safely and complete 8 steps in 20 seconds Standing Unsupported, One Foot in Front: Able to plae foot ahead of the other independently and hold 30 seconds Standing on One Leg: Tries to lift leg/unable to hold 3 seconds but remains standing independently Total Score: 37 Timed Up  and Go Test TUG: Normal TUG Normal TUG (seconds): 17(RW and S) Static Sitting Balance Static Sitting - Balance Support: No upper extremity supported;Feet supported Static Sitting - Level of Assistance: 7: Independent Dynamic Sitting Balance Dynamic Sitting - Balance Support: Feet supported;No upper extremity supported;During functional activity Dynamic Sitting - Level of Assistance: 7: Independent Static Standing Balance Static Standing - Balance Support: No upper extremity supported;During functional activity Static Standing - Level of Assistance: 6: Modified independent (Device/Increase time);5: Stand by assistance Dynamic Standing Balance Dynamic Standing - Balance Support: During functional activity;No upper extremity supported Dynamic Standing - Level of Assistance: 5: Stand by assistance Extremity/Trunk Assessment RUE Assessment RUE Assessment: Within Functional Limits LUE Assessment LUE Assessment: Within Functional Limits   Corneilus Heggie L 05/04/2018, 4:46 PM

## 2018-05-04 NOTE — Discharge Summary (Signed)
Discharge summary job # [id number]

## 2018-05-04 NOTE — Progress Notes (Signed)
[facility] PHYSICAL MEDICINE & REHABILITATION PROGRESS NOTE   Subjective/Complaints: No loose stools since yesterday morning. Had a good day of rehab. Feeling well this morning. Good appetite.  ROS: Patient denies fever, rash, sore throat, blurred vision, nausea, vomiting, diarrhea, cough, shortness of breath or chest pain, joint or back pain, headache, or mood change.    Objective:   No results found. Recent Labs    05/03/18 1000  WBC 9.3  HGB 13.3  HCT 40.6  PLT 202   Recent Labs    05/03/18 1000  NA 138  K 3.5  CL 110  CO2 20*  GLUCOSE 147*  BUN 23  CREATININE 1.06  CALCIUM 9.5    Intake/Output Summary (Last 24 hours) at 05/04/2018 1010 Last data filed at 05/04/2018 0900 Gross per 24 hour  Intake 576 ml  Output -  Net 576 ml     Physical Exam: Vital Signs Blood pressure 137/77, pulse 80, temperature (!) 97.5 F (36.4 C), temperature source Oral, resp. rate 18, height 6\' 2"  (5.88 m), weight 81 kg, SpO2 96 %. Constitutional: No distress . Vital signs reviewed. HEENT: EOMI, oral membranes moist Neck: supple Cardiovascular: RRR without murmur. No JVD    Respiratory: CTA Bilaterally without wheezes or rales. Normal effort    GI: BS +, non-tender, non-distended  Musc: No edema or tenderness in extremities. Neurological: Alert,oriented to person and place Follows simple commands Motor: Bilateral upper extremities: Grossly 4/5, stable.  Bilateral lower extremities: Grossly 4/5, stable Skin:Abrasions on both knees heeling Psych; pleasant   Assessment/Plan: 1. 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  Physiatrist is providing close team supervision and 24 hour management of active medical problems listed below.  Physiatrist and rehab team continue to assess barriers to discharge/monitor patient progress toward functional and medical goals  Care  Tool:  Bathing    Body parts bathed by patient: Right arm, Left arm, Chest, Abdomen, Front perineal area, Buttocks, Face, Left lower leg, Right lower leg, Left upper leg, Right upper leg         Bathing assist Assist Level: Contact Guard/Touching assist     Upper Body Dressing/Undressing Upper body dressing   What is the patient wearing?: Pull over shirt    Upper body assist Assist Level: Set up assist    Lower Body Dressing/Undressing Lower body dressing      What is the patient wearing?: Underwear/pull up, Pants     Lower body assist Assist for lower body dressing: Contact Guard/Touching assist     Toileting Toileting    Toileting assist Assist for toileting: Contact Guard/Touching assist     Transfers Chair/bed transfer  Transfers assist     Chair/bed transfer assist level: Contact Guard/Touching assist     Locomotion Ambulation   Ambulation assist      Assist level: Contact Guard/Touching assist Assistive device: Walker-rolling Max distance: 150'   Walk 10 feet activity   Assist     Assist level: Contact Guard/Touching assist Assistive device: Walker-rolling   Walk 50 feet activity   Assist    Assist level: Contact Guard/Touching assist Assistive device: Walker-rolling    Walk 150 feet activity   Assist    Assist level: Contact Guard/Touching assist Assistive device: Walker-rolling    Walk 10 feet on uneven surface  activity   Assist Walk 10 feet on uneven surfaces activity did not occur: Safety/medical concerns  Wheelchair     Assist Will patient use wheelchair at discharge?: No             Wheelchair 50 feet with 2 turns activity    Assist            Wheelchair 150 feet activity     Assist           Medical Problem List and Plan: 1.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 DVT Prophylaxis/Anticoagulation: Subcutaneous Lovenox.monitor for any bleeding episodes 3. Pain Management:Tylenol as needed 4. Mood:Zoloft 100 mg daily 5. Neuropsych: This patientisnot fully capable of making decisions on hisown behalf. 6. Skin/Wound Care:Routine skin checks 7. Fluids/Electrolytes/Nutrition:     -added protein supp for low albumin   BMP within acceptable range on 1/1, except for sl elevation of BUN---push fluids 8. ID. Maxipime complete 9. Urinary retention/BPH. No voiding issues so far. 10. Hypertension. Discussed with wife. Unclear why bp has been elevated  Avapro 150 mg daily, increased to 225 on 12/30    Coreg 3.125 mg twice a day.   BP a little too soft now with increased avapro--reduce back to 150mg  qpm 11. Hyperlipidemia. Lipitor 12. Chronic constipation. 13.  Prediabetes  Follow up as outpt 14. Loose stools: none for 24 hours  -likely due to abx  -miralax held  -imodium/probiotics  -no odor, temp, abdominal pain. Appetite is good  -labs basically normal.   LOS: 6 days A FACE TO FACE EVALUATION WAS PERFORMED  Meredith Staggers 05/04/2018, 10:10 AM

## 2018-05-04 NOTE — Progress Notes (Signed)
Occupational Therapy Session Note  Patient Details  Name: Eric George MRN: [id number] Date of Birth: [birth year]  Today's Date: 05/04/2018 OT Individual Time: 1430-1530 OT Individual Time Calculation (min): 60 min    Short Term Goals: Week 1:  OT Short Term Goal 1 (Week 1): STGs=LTGs due to ELOS  Skilled Therapeutic Interventions/Progress Updates:    Pt seen for OT session focusing on functional transfers and standing balance/endurance. Pt sitting up in recliner upon arrival with wife present. Pt denying pain and agreeable to tx session. He declined bathing/dressing tasks this session reporting he completed earlier in the day at supervision level. He ambulated throughout unit with RW and supervision, occasional cuing for safety awareness and RW management in functional context. In ADL apartment, completed simulated shower stall transfer to shower seat. Pt able to complete  With supervision following VCs and demonstration from therapist for technique. Extensive education provided to pt and wife regarding bathroom set-up for increased safety and reducing fall risk and recommendation to complete dry trial of shower transfer prior to completing actual bathing task. Completed sit<>stand from low soft surface couch with supervision. In therapy gym, completed dynamic standing task standing on non-compliant foam mat. #1 wrist weights placed on B UEs and pt stood to fold towels, maintaining dynamic balance without UE support and CGA. Then completed functional reaching task while standing on mat with min A. Seated rest breaks required btwn trials. Pt ambulated back to room at end of session using grocery cart to facilitate less UE support during functional ambulation. Completed with supervision. Educated regarding need to wait for OPPT to make recommendations on progressing AD at d/c, pt and wife voiced understanding. Pt left seated in recliner at end of session, all needs in reach and wife present.    Therapy Documentation Precautions:  Precautions Precautions: Fall Restrictions Weight Bearing Restrictions: No Pain:   No/denies pain   Therapy/Group: Individual Therapy  Lisel Siegrist L 05/04/2018, 12:49 PM

## 2018-05-04 NOTE — Progress Notes (Signed)
Physical Therapy Discharge Summary  Patient Details  Name: Eric George MRN: [id number] Date of Birth: [birth year]  Today's Date: 05/04/2018 PT Individual Time: 3235-5732 PT Individual Time Calculation (min): 45 min    Patient has met 6 of 6 long term goals due to improved activity tolerance, improved balance, improved postural control, increased strength and ability to compensate for deficits.  Patient to discharge at an ambulatory level Supervision.   Patient's care partner is independent to provide the necessary physical assistance at discharge.  Reasons goals not met: All goals met  Recommendation:  Patient will benefit from ongoing skilled PT services in outpatient setting to continue to advance safe functional mobility, address ongoing impairments in strength, balance, coordination, activity tolerance, and minimize fall risk.  Equipment: RW  Reasons for discharge: treatment goals met and discharge from hospital  Patient/family agrees with progress made and goals achieved: Yes  PT Discharge Precautions/Restrictions Restrictions Weight Bearing Restrictions: No Vision/Perception    WFL; wears glasses at all times Cognition Overall Cognitive Status: History of cognitive impairments - at baseline Arousal/Alertness: Awake/alert Sustained Attention: Impaired Memory: Impaired Awareness: Impaired Awareness Impairment: Emergent impairment Safety/Judgment: Impaired Sensation Sensation Light Touch: Appears Intact Proprioception: Appears Intact Coordination Gross Motor Movements are Fluid and Coordinated: No Fine Motor Movements are Fluid and Coordinated: Yes Coordination and Movement Description: impaired by generalized weakness Finger Nose Finger Test: WNL B UEs Heel Shin Test: decreased speed B Motor  Motor Motor: Within Functional Limits  Mobility Bed Mobility Bed Mobility: Rolling Right;Rolling Left;Supine to Sit;Sit to Supine Rolling Right: Independent Rolling  Left: Independent Supine to Sit: Independent Sit to Supine: Independent Transfers Transfers: Sit to Stand;Stand Pivot Transfers Sit to Stand: Supervision/Verbal cueing Stand to Sit: Supervision/Verbal cueing Stand Pivot Transfers: Supervision/Verbal cueing Stand Pivot Transfer Details: Verbal cues for precautions/safety Locomotion  Gait Ambulation: Yes Gait Assistance: Supervision/Verbal cueing Gait Distance (Feet): 200 Feet Assistive device: Rolling walker Gait Assistance Details: Verbal cues for precautions/safety;Verbal cues for safe use of DME/AE Gait Gait: Yes Gait Pattern: Impaired Gait Pattern: Lateral hip instability;Poor foot clearance - left;Poor foot clearance - right Gait velocity: 2.5 ft/sec Stairs / Additional Locomotion Stairs: Yes Stairs Assistance: Supervision/Verbal cueing Stair Management Technique: Two rails;Alternating pattern;Forwards Number of Stairs: 12 Height of Stairs: 6 Ramp: Supervision/Verbal cueing Curb: Supervision/Verbal cueing Wheelchair Mobility Wheelchair Mobility: No  Trunk/Postural Assessment  Cervical Assessment Cervical Assessment: Exceptions to WFL(forward head) Thoracic Assessment Thoracic Assessment: Exceptions to WFL(rounded shoulders, increased kyphosis) Lumbar Assessment Lumbar Assessment: Exceptions to WFL(reduced lumbar lordosis) Postural Control Postural Control: Deficits on evaluation Righting Reactions: delayed  Balance Balance Balance Assessed: Yes Standardized Balance Assessment Standardized Balance Assessment: Timed Up and Go Test Berg Balance Test Sit to Stand: Able to stand  independently using hands Standing Unsupported: Able to stand 2 minutes with supervision Sitting with Back Unsupported but Feet Supported on Floor or Stool: Able to sit safely and securely 2 minutes Stand to Sit: Controls descent by using hands Transfers: Able to transfer safely, definite need of hands Standing Unsupported with Eyes Closed:  Able to stand 10 seconds with supervision Standing Ubsupported with Feet Together: Able to place feet together independently and stand for 1 minute with supervision From Standing, Reach Forward with Outstretched Arm: Reaches forward but needs supervision From Standing Position, Pick up Object from Floor: Able to pick up shoe, needs supervision From Standing Position, Turn to Look Behind Over each Shoulder: Turn sideways only but maintains balance Turn 360 Degrees: Needs close supervision or verbal cueing Standing Unsupported,  Alternately Place Feet on Step/Stool: Able to stand independently and safely and complete 8 steps in 20 seconds Standing Unsupported, One Foot in Front: Able to plae foot ahead of the other independently and hold 30 seconds Standing on One Leg: Tries to lift leg/unable to hold 3 seconds but remains standing independently Total Score: 37 Timed Up and Go Test TUG: Normal TUG Normal TUG (seconds): 17(RW and S) Static Sitting Balance Static Sitting - Balance Support: No upper extremity supported;Feet supported Static Sitting - Level of Assistance: 7: Independent Dynamic Sitting Balance Dynamic Sitting - Balance Support: Feet supported;No upper extremity supported;During functional activity Dynamic Sitting - Level of Assistance: 7: Independent Static Standing Balance Static Standing - Balance Support: No upper extremity supported;During functional activity Static Standing - Level of Assistance: 5: Stand by assistance Dynamic Standing Balance Dynamic Standing - Balance Support: During functional activity;No upper extremity supported Dynamic Standing - Level of Assistance: 5: Stand by assistance Extremity Assessment  RUE Assessment RUE Assessment: Within Functional Limits LUE Assessment LUE Assessment: Within Functional Limits RLE Assessment RLE Assessment: Within Functional Limits RLE Strength Right Hip Flexion: 5/5 Right Hip ABduction: 3/5 Right Knee Flexion:  5/5 Right Knee Extension: 5/5 Right Ankle Dorsiflexion: 5/5 LLE Assessment LLE Assessment: Within Functional Limits LLE Strength Left Hip Flexion: 5/5 Left Hip ABduction: 5/5 Left Knee Flexion: 4/5 Left Knee Extension: 5/5 Left Ankle Dorsiflexion: 5/5  Skilled Therapeutic Intervention: Pt missed 30 min PT d/t xray. Upon therapist's return, pt denies pain and agreeable to treatment. Assessed all mobility and balance outcome measures as above with RW and S overall. Performed floor transfer with close S; pt did verbalize pain in L knee while transitioning tall>short kneeling, however dissipated quickly and able to perform remainder of floor transfer without additional pain or difficulty. Performed nustep x8 min level 8 BUE/BLE for strengthening and aerobic endurance. Gait to return to room with RW and S. Remained in recliner, wife present, all needs in reach at completion of session. Pt and wife with no further questions/concerns regarding d/c home ambulatory with RW.    Benjiman Core  05/04/2018, 2:09 PM

## 2018-05-04 NOTE — Progress Notes (Signed)
Pt wife and NT Lacie Scotts state pt was stepping out of shower and heard a "pop" noise and pt c/o left knee pain. Pt walked to bed with RW and c/o left knee pain.  No swelling, pt able to bend knee without distress but states it is hurting 8/10 and pt's wife states, "I can't take him home tomorrow if this pain doesn't go away".  Call to Danella Sensing, NP on call and order for xray received, 2 tylenol given.

## 2018-05-04 NOTE — Care Management (Signed)
Inpatient [place] Individual Statement of Services  Patient Name:  Eric George  Date:  05/03/2019  Welcome to the [facility].  Our goal is to provide you with an individualized program based on your diagnosis and situation, designed to meet your specific needs.  With this comprehensive rehabilitation program, you will be expected to participate in at least 3 hours of rehabilitation therapies Monday-Friday, with modified therapy programming on the weekends.  Your rehabilitation program will include the following services:  Physical Therapy (PT), Occupational Therapy (OT), Speech Therapy (ST), 24 hour per day rehabilitation nursing, Therapeutic Recreaction (TR), Case Management (Social Worker), Rehabilitation Medicine, Nutrition Services and Pharmacy Services  Weekly team conferences will be held on Tuesdays to discuss your progress.  Your Social Worker will talk with you frequently to get your input and to update you on team discussions.  Team conferences with you and your family in attendance may also be held.  Expected length of stay: 5-7 days   Overall anticipated outcome: supervision  Depending on your progress and recovery, your program may change. Your Social Worker will coordinate services and will keep you informed of any changes. Your Social Worker's name and contact numbers are listed  below.  The following services may also be recommended but are not provided by the [facility] will be made to provide these services after discharge if needed.  Arrangements include referral to agencies that provide these services.  Your insurance has been verified to be:  Medicare; [place] Your primary doctor is:  Press photographer  Pertinent information will be shared with your doctor and your insurance company.  Social Worker:  [place], [place] or ([phone]   Information discussed with and copy given to patient by: Lennart Pall, 05/02/2018, 12:12 PM
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
## 2018-05-05 ENCOUNTER — Telehealth: Payer: Self-pay | Admitting: *Deleted

## 2018-05-05 MED ORDER — SERTRALINE HCL 100 MG PO TABS: ORAL_TABLET | ORAL | 0 refills | Status: DC

## 2018-05-05 MED ORDER — SACCHAROMYCES BOULARDII 250 MG PO CAPS: 250.0000 mg | ORAL_CAPSULE | Freq: Two times a day (BID) | ORAL | 0 refills | Status: DC

## 2018-05-05 MED ORDER — OMEPRAZOLE 40 MG PO CPDR: 40.0000 mg | DELAYED_RELEASE_CAPSULE | Freq: Every day | ORAL | 1 refills | Status: DC

## 2018-05-05 MED ORDER — CARVEDILOL 3.125 MG PO TABS: 3.1250 mg | ORAL_TABLET | Freq: Two times a day (BID) | ORAL | 2 refills | Status: DC

## 2018-05-05 MED ORDER — IRBESARTAN 150 MG PO TABS: 150.0000 mg | ORAL_TABLET | Freq: Every day | ORAL | 1 refills | Status: DC

## 2018-05-05 MED ORDER — ATORVASTATIN CALCIUM 40 MG PO TABS: 40.0000 mg | ORAL_TABLET | Freq: Every day | ORAL | 0 refills | Status: DC

## 2018-05-05 NOTE — Patient Care Conference (Signed)
Inpatient RehabilitationTeam Conference and Plan of Care Update Date: 05/03/2018   Time: 2:20 PM    Patient Name: Eric George      Medical Record Number: [id number]  Date of Birth: May 20, 1930 Sex: Male         Room/Bed: 4W05C/4W05C-01 Payor Info: Payor: MEDICARE / Plan: MEDICARE PART A AND B / Product Type: *No Product type* /    Admitting Diagnosis: Sepsis  Admit Date/Time:  04/28/2018  6:27 PM Admission Comments: No comment available   Primary Diagnosis:  <principal problem not specified> Principal Problem: <principal problem not specified>  Patient Active Problem List   Diagnosis Date Noted  . Acute lower UTI   . Prediabetes   . Gait disorder 04/28/2018  . Acute encephalopathy 04/26/2018  . Dupuytren's contracture 10/30/2017  . Frequent falls 10/30/2017  . History of closed head injury 07/25/2017  . Osteoarthritis, multiple sites 07/08/2016  . Risk for falls 07/08/2016  . Premature atrial contractions 05/11/2015  . Sepsis ([place]) 04/29/2015  . History of small bowel obstruction 04/29/2015  . History of traumatic brain injury 04/29/2015  . Acute respiratory failure ([place]) 04/29/2015  . CAP (community acquired pneumonia)   . Unspecified cerebral artery occlusion with cerebral infarction 08/25/2012  . Aphasia 08/25/2012  . Macular degeneration 04/25/2012  . CVA (cerebral infarction) 03/20/2012  . Small bowel obstruction ([place]) 09/12/2011  . Nausea & vomiting 09/12/2011  . Memory loss 04/01/2010  . 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 SKIN LESION 10/24/2009  . NEOPLASM, SKIN, UNCERTAIN BEHAVIOR 01/75/1025  . DYSGEUSIA 10/10/2009  . Hyperlipidemia 07/20/2008  . DEPRESSION 07/20/2008  . Essential hypertension 07/20/2008  . RHINITIS 07/20/2008  . COLONIC POLYPS, HX OF 07/20/2008  . GERD 07/18/2008  . PROSTATE CANCER, HX OF 07/18/2008    Expected Discharge Date: Expected Discharge Date: 05/05/18  Team Members Present: Physician leading conference: Dr. Alger Simons Social Worker Present: Lennart Pall, LCSW Nurse Present: Rayetta Pigg, RN PT Present: Canary Brim, PT OT Present: Willeen Cass, OT SLP Present: Weston Anna, SLP PPS Coordinator present : Gunnar Fusi     Current Status/Progress Goal Weekly Team Focus  Medical   Gait disorder, exacerbated by recent pneumonia.  Patient with history of traumatic brain injury and stroke.  Hypertension has been an issue at times  Stabilized medically for discharge  Blood pressure management and ID management.   Bowel/Bladder   Continent of bowel/bladder LBM 05/02/18  Remain continent of bowel/bladder with min assist  Assisst with bowel needs.   Swallow/Nutrition/ Hydration             ADL's   Steady assist BADLs at shower level + functional transfers at ambulatory level using RW  Supervision/cuing   D/c planning, balance, DME mgt/safety, pt/spouse education    Mobility   S bed mobility and transfers, S to CGA gait up to 150' with RW  S overall with RW  glute strengthening, dynamic standing balance, family training with wife   Communication             Safety/Cognition/ Behavioral Observations            Pain   No complain of pain  <2  Assess and treat pain q shift and as needed   Skin   No skin issues noted  Maintain skin integrity  Assess skin q shift and as needed    Rehab Goals Patient on target to meet rehab goals: Yes *See Care Plan and progress notes for long and short-term  goals.     Barriers to Discharge  Current Status/Progress Possible Resolutions Date Resolved   Physician    Medical stability        See medical problem list and plan      Nursing                  PT                    OT                  SLP                SW                Discharge Planning/Teaching Needs:  Home with wife who can provide 24/7 assistance.  Teaching has been ongoing.   Team Discussion:  Pt with baseline cognitive deficits. Monitoring BP.  Steady assist with mobility and  ADLs.  Supervision goals.  Ongoing education with spouse.  Revisions to Treatment Plan:  NA    Continued Need for Acute Rehabilitation Level of Care: The patient requires daily medical management by a physician with specialized training in physical medicine and rehabilitation for the following conditions: Daily direction of a multidisciplinary physical rehabilitation program to ensure safe treatment while eliciting the highest outcome that is of practical value to the patient.: Yes Daily medical management of patient stability for increased activity during participation in an intensive rehabilitation regime.: Yes Daily analysis of laboratory values and/or radiology reports with any subsequent need for medication adjustment of medical intervention for : Pulmonary problems;Other   I attest that I was present, lead the team conference, and concur with the assessment and plan of the team.   Iliza Blankenbeckler 05/05/2018, 9:19 AM

## 2018-05-05 NOTE — Progress Notes (Signed)
[facility] PHYSICAL MEDICINE & REHABILITATION PROGRESS NOTE   Subjective/Complaints: Pt feeling well today. Stools a little loose but infrequent. Eating well  ROS: Patient denies fever, rash, sore throat, blurred vision, nausea, vomiting,   cough, shortness of breath or chest pain, joint or back pain, headache, or mood change.   Objective:   Dg Knee Left Port  Result Date: 05/04/2018 CLINICAL DATA:  85 year old male with history of left knee pain after hearing a large pop in the left knee. EXAM: PORTABLE LEFT KNEE - 1-2 VIEW COMPARISON:  No priors. FINDINGS: Two views of the left knee demonstrate no acute displaced fracture, subluxation or dislocation. Small ossific loose bodies are noted in the anterior aspect of the joint space. Joint space narrowing, subchondral sclerosis, subchondral cyst formation and osteophyte formation is noted in a tricompartmental distribution, compatible with moderate osteoarthritis. IMPRESSION: 1. No acute radiographic abnormality of the left knee. 2. Small loose bodies in the anterior aspect of the joint space. 3. Moderate tricompartmental osteoarthritis. Electronically Signed   By: Vinnie Langton M.D.   On: 05/04/2018 13:44   Recent Labs    05/03/18 1000  WBC 9.3  HGB 13.3  HCT 40.6  PLT 202   Recent Labs    05/03/18 1000  NA 138  K 3.5  CL 110  CO2 20*  GLUCOSE 147*  BUN 23  CREATININE 1.06  CALCIUM 9.5    Intake/Output Summary (Last 24 hours) at 05/05/2018 0910 Last data filed at 05/04/2018 1700 Gross per 24 hour  Intake 270 ml  Output -  Net 270 ml     Physical Exam: Vital Signs Blood pressure 137/79, pulse 68, temperature 98.2 F (36.8 C), resp. rate 18, height 6\' 2"  (1.88 m), weight 81 kg, SpO2 98 %. Constitutional: No distress . Vital signs reviewed. HEENT: EOMI, oral membranes moist Neck: supple Cardiovascular: RRR without murmur. No JVD    Respiratory: CTA Bilaterally without wheezes or rales. Normal effort    GI: BS +,  non-tender, non-distended  Musc: No edema or tenderness in extremities. Neurological: Alert,oriented to person and place Follows simple commands Motor: Bilateral upper extremities: Grossly 4/5, stable.  Bilateral lower extremities: Grossly 4/5, stable Skin:Abrasions on both knees heeling Psych; pleasant, cooperative   Assessment/Plan: 1. 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  Physiatrist is providing close team supervision and 24 hour management of active medical problems listed below.  Physiatrist and rehab team continue to assess barriers to discharge/monitor patient progress toward functional and medical goals  Care Tool:  Bathing    Body parts bathed by patient: Right arm, Left arm, Chest, Abdomen, Front perineal area, Buttocks, Face, Left lower leg, Right lower leg, Left upper leg, Right upper leg         Bathing assist Assist Level: Supervision/Verbal cueing(Per pt report)     Upper Body Dressing/Undressing Upper body dressing   What is the patient wearing?: Pull over shirt    Upper body assist Assist Level: Set up assist(Per pt report)    Lower Body Dressing/Undressing Lower body dressing      What is the patient wearing?: Underwear/pull up, Pants(Per pt report)     Lower body assist Assist for lower body dressing: Supervision/Verbal cueing     Toileting Toileting    Toileting assist Assist for toileting: Supervision/Verbal cueing     Transfers Chair/bed transfer  Transfers assist     Chair/bed transfer assist  level: Supervision/Verbal cueing     Locomotion Ambulation   Ambulation assist      Assist level: Supervision/Verbal cueing Assistive device: Walker-rolling Max distance: 200   Walk 10 feet activity   Assist     Assist level: Supervision/Verbal cueing Assistive device: Walker-rolling   Walk 50 feet  activity   Assist    Assist level: Supervision/Verbal cueing Assistive device: Walker-rolling    Walk 150 feet activity   Assist    Assist level: Supervision/Verbal cueing Assistive device: Walker-rolling    Walk 10 feet on uneven surface  activity   Assist Walk 10 feet on uneven surfaces activity did not occur: Safety/medical concerns   Assist level: Contact Guard/Touching assist     Wheelchair     Assist Will patient use wheelchair at discharge?: No             Wheelchair 50 feet with 2 turns activity    Assist            Wheelchair 150 feet activity     Assist           Medical Problem List and Plan: 1.Acute on chronic gait multifactorial disorder/debility/possible sepsisin settingofpriorTBI and CVA  -dc home today  -outpt therapy at neurorehab 2. DVT Prophylaxis/Anticoagulation: Subcutaneous Lovenox.monitor for any bleeding episodes 3. Pain Management:Tylenol as needed 4. Mood:Zoloft 100 mg daily 5. Neuropsych: This patientisnot fully capable of making decisions on hisown behalf. 6. Skin/Wound Care:Routine skin checks 7. Fluids/Electrolytes/Nutrition:     -added protein supp for low albumin    push fluids 8. ID. Maxipime complete 9. Urinary retention/BPH. No voiding issues so far. 10. Hypertension. Discussed with wife. Unclear why bp has been elevated  Avapro 150 mg daily,   11. Hyperlipidemia. Lipitor 12. Chronic constipation. 13.  Prediabetes  Follow up as outpt 14. Loose stools: improving  -likely due to abx  -miralax held  -imodium/probiotics  -no odor, temp, abdominal pain. Appetite is good      LOS: 7 days A FACE TO FACE EVALUATION WAS PERFORMED  Meredith Staggers 05/05/2018, 9:10 AM

## 2018-05-05 NOTE — Discharge Instructions (Signed)
Inpatient Rehab Discharge Instructions  Eric George Discharge date and time: No discharge date for patient encounter.   Activities/Precautions/ Functional Status: Activity: activity as tolerated Diet: regular diet Wound Care: none needed Functional status:  ___ No restrictions     ___ Walk up steps independently ___ 24/7 supervision/assistance   ___ Walk up steps with assistance ___ Intermittent supervision/assistance  ___ Bathe/dress independently ___ Walk with walker     _x__ Bathe/dress with assistance ___ Walk Independently    ___ Shower independently ___ Walk with assistance    ___ Shower with assistance ___ No alcohol     ___ Return to work/school ________    COMMUNITY REFERRALS UPON DISCHARGE:    Outpatient: PT                   Agency:  [facility]   Phone: [phone]               Appointment Date/Time: 05/12/18 @ 1:15 pm (please arrive 15 mins prior)  Medical Equipment/Items Ordered: rolling walker, tub seat                                                     Agency/Supplier:  [facility] @ [phone]      Special Instructions: No driving   My questions have been answered and I understand these instructions. I will adhere to these goals and the provided educational materials after my discharge from the hospital.  Patient/Caregiver Signature _______________________________ Date __________  Clinician Signature _______________________________________ Date __________  Please bring this form and your medication list with you to all your follow-up doctor's appointments.

## 2018-05-05 NOTE — Progress Notes (Signed)
Social Work  Discharge Note  The overall goal for the admission was met for:   Discharge location: Yes - home with wife who can provide 24/7 supervision  Length of Stay: Yes - 7 days  Discharge activity level: Yes - supervision  Home/community participation: Yes  Services provided included: MD, RD, PT, OT, SLP, RN, TR, Pharmacy and SW  Financial Services: Medicare and Private Insurance: [place]  Follow-up services arranged: Outpatient: PT via [facility], DME: rolling walker, tub seat via [place] and Patient/Family has no preference for HH/DME agencies  Comments (or additional information):  Patient/Family verbalized understanding of follow-up arrangements: Yes  Individual responsible for coordination of the follow-up plan: spouse  Confirmed correct DME delivered: Gregorio Worley 05/05/2018    Nakyla Bracco

## 2018-05-05 NOTE — Plan of Care (Signed)
To d/c home with wife
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
## 2018-05-05 NOTE — Telephone Encounter (Signed)
Transition Care Management Follow-up Telephone Call  Eric George VCB:[id number] DOB: [birth year] DOA: 04/25/2018  PCP: Eulas Post, MD  Admit date: 04/25/2018 Discharge date: 04/28/2018  Admitted From: home Disposition:  CIR      Discharge Condition:  stable   CODE STATUS:  Full code   Diet recommendation:  Heart healthy Consultations:  Neurology   Discharge Diagnoses:  Principal Problem:   Fever, cough- likely pneumonia Active Problems:   Fall    H/o TBI   Acute encephalopathy   Essential hypertension   How have you been since you were released from the hospital? Doing pretty good    Do you understand why you were in the hospital? yes   Do you understand the discharge instructions? yes   Where were you discharged to? home   Items Reviewed:  Medications reviewed: yes  Allergies reviewed: yes  Dietary changes reviewed: yes  Referrals reviewed: yes   Functional Questionnaire:   Activities of Daily Living (ADLs):   He states they are independent in the following: ambulation using a walker States they require assistance with the following: ambulation   Any transportation issues/concerns?: no   Any patient concerns? Yes PATIENT HAD HYPERTENSION IN [place]   Confirmed importance and date/time of follow-up visits scheduled yes  Provider Appointment booked with Dr Elease Hashimoto 05/10/18 at 2 pm  Confirmed with patient if condition begins to worsen call PCP or go to the ER.  Patient was given the office number and encouraged to call back with question or concerns.  : yes

## 2018-05-10 ENCOUNTER — Other Ambulatory Visit: Payer: Self-pay

## 2018-05-10 ENCOUNTER — Ambulatory Visit: Payer: Medicare Other | Admitting: Physical Therapy

## 2018-05-10 ENCOUNTER — Encounter: Payer: Self-pay | Admitting: Family Medicine

## 2018-05-10 ENCOUNTER — Ambulatory Visit (INDEPENDENT_AMBULATORY_CARE_PROVIDER_SITE_OTHER): Payer: Medicare Other | Admitting: Family Medicine

## 2018-05-10 VITALS — BP 132/84 | HR 77 | Temp 97.7°F | Ht 74.0 in | Wt 182.2 lb

## 2018-05-10 DIAGNOSIS — G9340 Encephalopathy, unspecified: Secondary | ICD-10-CM

## 2018-05-10 DIAGNOSIS — R296 Repeated falls: Secondary | ICD-10-CM | POA: Diagnosis not present

## 2018-05-10 DIAGNOSIS — I10 Essential (primary) hypertension: Secondary | ICD-10-CM | POA: Diagnosis not present

## 2018-05-10 DIAGNOSIS — Z87820 Personal history of traumatic brain injury: Secondary | ICD-10-CM

## 2018-05-10 NOTE — Patient Instructions (Signed)
Continue to monitor blood pressure and be in touch if consistently > 150/90.
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
## 2018-05-12 ENCOUNTER — Other Ambulatory Visit: Payer: Self-pay

## 2018-05-12 ENCOUNTER — Ambulatory Visit: Payer: Medicare Other | Attending: Physical Medicine & Rehabilitation | Admitting: Physical Therapy

## 2018-05-12 ENCOUNTER — Encounter: Payer: Self-pay | Admitting: Physical Therapy

## 2018-05-12 DIAGNOSIS — R2681 Unsteadiness on feet: Secondary | ICD-10-CM | POA: Insufficient documentation

## 2018-05-12 DIAGNOSIS — M6281 Muscle weakness (generalized): Secondary | ICD-10-CM | POA: Insufficient documentation

## 2018-05-12 DIAGNOSIS — R262 Difficulty in walking, not elsewhere classified: Secondary | ICD-10-CM | POA: Diagnosis not present

## 2018-05-12 DIAGNOSIS — Z9181 History of falling: Secondary | ICD-10-CM | POA: Diagnosis not present

## 2018-05-12 NOTE — Addendum Note (Signed)
Addended by: Rico Junker on: 05/12/2018 05:12 PM   Modules accepted: Orders
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
## 2018-05-19 ENCOUNTER — Encounter: Payer: Self-pay | Admitting: Physical Therapy

## 2018-05-19 ENCOUNTER — Ambulatory Visit: Payer: Medicare Other | Admitting: Physical Therapy

## 2018-05-19 DIAGNOSIS — H353132 Nonexudative age-related macular degeneration, bilateral, intermediate dry stage: Secondary | ICD-10-CM | POA: Diagnosis not present

## 2018-05-19 DIAGNOSIS — R2681 Unsteadiness on feet: Secondary | ICD-10-CM

## 2018-05-19 DIAGNOSIS — Z9181 History of falling: Secondary | ICD-10-CM | POA: Diagnosis not present

## 2018-05-19 DIAGNOSIS — M6281 Muscle weakness (generalized): Secondary | ICD-10-CM | POA: Diagnosis not present

## 2018-05-19 DIAGNOSIS — H35033 Hypertensive retinopathy, bilateral: Secondary | ICD-10-CM | POA: Diagnosis not present

## 2018-05-19 DIAGNOSIS — H40013 Open angle with borderline findings, low risk, bilateral: Secondary | ICD-10-CM | POA: Diagnosis not present

## 2018-05-19 DIAGNOSIS — R262 Difficulty in walking, not elsewhere classified: Secondary | ICD-10-CM

## 2018-05-19 DIAGNOSIS — H35371 Puckering of macula, right eye: Secondary | ICD-10-CM | POA: Diagnosis not present
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
## 2018-05-20 ENCOUNTER — Encounter

## 2018-05-20 ENCOUNTER — Ambulatory Visit: Payer: Medicare Other | Admitting: Physical Therapy

## 2018-05-25 ENCOUNTER — Ambulatory Visit: Payer: Medicare Other | Admitting: Physical Therapy

## 2018-05-25 ENCOUNTER — Encounter: Payer: Self-pay | Admitting: Physical Therapy

## 2018-05-25 DIAGNOSIS — R2681 Unsteadiness on feet: Secondary | ICD-10-CM

## 2018-05-25 DIAGNOSIS — Z9181 History of falling: Secondary | ICD-10-CM | POA: Diagnosis not present

## 2018-05-25 DIAGNOSIS — M6281 Muscle weakness (generalized): Secondary | ICD-10-CM | POA: Diagnosis not present

## 2018-05-25 DIAGNOSIS — R262 Difficulty in walking, not elsewhere classified: Secondary | ICD-10-CM

## 2018-05-25 NOTE — Patient Instructions (Signed)
Access Code: [email or web address]  URL: [email or web address]  Date: 05/25/2018  Prepared by: Lovett Calender   Exercises  Standing Heel Raises - 10 reps - 3 sets - 1x daily - 7x weekly  Standing Hip Flexion - 10 reps - 3 sets - 1x daily - 7x weekly
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
## 2018-05-27 ENCOUNTER — Ambulatory Visit: Payer: Medicare Other | Admitting: Physical Therapy

## 2018-05-27 ENCOUNTER — Encounter: Payer: Self-pay | Admitting: Physical Therapy

## 2018-05-27 DIAGNOSIS — Z9181 History of falling: Secondary | ICD-10-CM

## 2018-05-27 DIAGNOSIS — M6281 Muscle weakness (generalized): Secondary | ICD-10-CM | POA: Diagnosis not present

## 2018-05-27 DIAGNOSIS — R262 Difficulty in walking, not elsewhere classified: Secondary | ICD-10-CM | POA: Diagnosis not present

## 2018-05-27 DIAGNOSIS — R2681 Unsteadiness on feet: Secondary | ICD-10-CM
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
## 2018-05-31 ENCOUNTER — Encounter: Payer: Medicare Other | Admitting: Physical Therapy

## 2018-06-02 ENCOUNTER — Ambulatory Visit: Payer: Medicare Other | Admitting: Physical Therapy

## 2018-06-02 ENCOUNTER — Encounter: Payer: Self-pay | Admitting: Physical Therapy

## 2018-06-02 DIAGNOSIS — R262 Difficulty in walking, not elsewhere classified: Secondary | ICD-10-CM | POA: Diagnosis not present

## 2018-06-02 DIAGNOSIS — Z9181 History of falling: Secondary | ICD-10-CM

## 2018-06-02 DIAGNOSIS — M6281 Muscle weakness (generalized): Secondary | ICD-10-CM | POA: Diagnosis not present

## 2018-06-02 DIAGNOSIS — R2681 Unsteadiness on feet: Secondary | ICD-10-CM
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
## 2018-06-02 NOTE — Patient Instructions (Signed)
Access Code: [email or web address]  URL: [email or web address]  Date: 06/02/2018  Prepared by: Sherol Dade   Exercises  Heel Raise - 20 reps - 1 sets - 3x daily - 7x weekly  Clamshell with Resistance - 10 reps - 3 sets - 2x daily - 7x weekly  Toe Raises with Counter Support - 20 reps - 1 sets - 2x daily - 7x weekly  Side Stepping with Resistance at Feet - 10 reps - 3 sets - 1x daily - 7x weekly  Tandem Stance - 10 reps - 3 sets - 1x daily - 7x weekly  Seated Hip Abduction - 10-15 reps - 2 sets - 1x daily - 7x weekly  Hip Extension with Resistance Loop - 10 reps - 3 sets - 1x daily - 7x weekly  Standing Bilateral Low Shoulder Row with Anchored Resistance - 10 reps - 3 sets - 1x daily - 7x weekly  Seated Ankle Inversion AROM - 20 reps - 1x daily - 7x weekly    [facility] [address], [place], [place] [address] Phone # [phone] Fax [phone]

## 2018-06-06 ENCOUNTER — Encounter: Payer: Self-pay | Admitting: Physical Therapy

## 2018-06-06 ENCOUNTER — Ambulatory Visit: Payer: Medicare Other | Attending: Physical Medicine & Rehabilitation | Admitting: Physical Therapy

## 2018-06-06 DIAGNOSIS — R262 Difficulty in walking, not elsewhere classified: Secondary | ICD-10-CM | POA: Diagnosis not present

## 2018-06-06 DIAGNOSIS — R2681 Unsteadiness on feet: Secondary | ICD-10-CM | POA: Insufficient documentation

## 2018-06-06 DIAGNOSIS — Z9181 History of falling: Secondary | ICD-10-CM | POA: Insufficient documentation

## 2018-06-06 DIAGNOSIS — M6281 Muscle weakness (generalized): Secondary | ICD-10-CM | POA: Diagnosis not present
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
## 2018-06-07 DIAGNOSIS — C44319 Basal cell carcinoma of skin of other parts of face: Secondary | ICD-10-CM | POA: Diagnosis not present

## 2018-06-09 ENCOUNTER — Ambulatory Visit: Payer: Medicare Other | Admitting: Physical Therapy

## 2018-06-10 DIAGNOSIS — H60391 Other infective otitis externa, right ear: Secondary | ICD-10-CM | POA: Diagnosis not present

## 2018-06-14 ENCOUNTER — Ambulatory Visit: Payer: Medicare Other | Admitting: Physical Therapy

## 2018-06-14 ENCOUNTER — Encounter: Payer: Self-pay | Admitting: Physical Therapy

## 2018-06-14 DIAGNOSIS — H903 Sensorineural hearing loss, bilateral: Secondary | ICD-10-CM | POA: Diagnosis not present

## 2018-06-14 DIAGNOSIS — M6281 Muscle weakness (generalized): Secondary | ICD-10-CM

## 2018-06-14 DIAGNOSIS — Z9181 History of falling: Secondary | ICD-10-CM

## 2018-06-14 DIAGNOSIS — R262 Difficulty in walking, not elsewhere classified: Secondary | ICD-10-CM

## 2018-06-14 DIAGNOSIS — R2681 Unsteadiness on feet: Secondary | ICD-10-CM
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
## 2018-06-16 ENCOUNTER — Ambulatory Visit: Payer: Medicare Other | Admitting: Physical Therapy

## 2018-06-16 ENCOUNTER — Encounter: Payer: Self-pay | Admitting: Physical Therapy

## 2018-06-16 DIAGNOSIS — M6281 Muscle weakness (generalized): Secondary | ICD-10-CM

## 2018-06-16 DIAGNOSIS — Z9181 History of falling: Secondary | ICD-10-CM | POA: Diagnosis not present

## 2018-06-16 DIAGNOSIS — R262 Difficulty in walking, not elsewhere classified: Secondary | ICD-10-CM

## 2018-06-16 DIAGNOSIS — R2681 Unsteadiness on feet: Secondary | ICD-10-CM | POA: Diagnosis not present
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
## 2018-06-18 ENCOUNTER — Other Ambulatory Visit: Payer: Self-pay | Admitting: Family Medicine

## 2018-07-08 ENCOUNTER — Other Ambulatory Visit: Payer: Self-pay | Admitting: Family Medicine

## 2018-07-08 NOTE — Telephone Encounter (Signed)
OK to fill?   Last filled by Lauraine Rinne PA 05/05/18, # [phone] with 1 refill

## 2018-07-08 NOTE — Telephone Encounter (Signed)
Refill for one year 

## 2018-07-27 ENCOUNTER — Ambulatory Visit: Payer: Medicare Other

## 2018-09-08 ENCOUNTER — Encounter: Payer: Self-pay | Admitting: Family Medicine

## 2018-09-08 ENCOUNTER — Telehealth: Payer: Self-pay | Admitting: *Deleted

## 2018-09-08 NOTE — Telephone Encounter (Signed)
Go ahead with PT orders.  Don't know if they want to do through home PT?

## 2018-09-08 NOTE — Telephone Encounter (Signed)
Please see message. °

## 2018-09-08 NOTE — Telephone Encounter (Signed)
Copied from [id number]. Topic: General - Other >> Sep 07, 2018  2:26 PM Celene Kras A wrote: Reason for CRM: Pts wife called stating her husbands mobility and balance is getting worse. She is requesting to have additional PT for pt in hopes this will improve his mobility. Pts wife states she does not know if pt will have to do a virtual visit or if PCP will just assign more PT. Please advise.

## 2018-09-09 ENCOUNTER — Other Ambulatory Visit: Payer: Self-pay

## 2018-09-09 DIAGNOSIS — R296 Repeated falls: Secondary | ICD-10-CM

## 2018-09-09 NOTE — Telephone Encounter (Signed)
Home PT orders have have been ordered.

## 2018-09-11 ENCOUNTER — Other Ambulatory Visit: Payer: Self-pay | Admitting: Family Medicine

## 2018-09-13 ENCOUNTER — Telehealth: Payer: Self-pay | Admitting: Family Medicine

## 2018-09-13 DIAGNOSIS — R2681 Unsteadiness on feet: Secondary | ICD-10-CM | POA: Diagnosis not present

## 2018-09-13 DIAGNOSIS — R413 Other amnesia: Secondary | ICD-10-CM | POA: Diagnosis not present

## 2018-09-13 DIAGNOSIS — M6281 Muscle weakness (generalized): Secondary | ICD-10-CM | POA: Diagnosis not present

## 2018-09-13 DIAGNOSIS — S062X0D Diffuse traumatic brain injury without loss of consciousness, subsequent encounter: Secondary | ICD-10-CM | POA: Diagnosis not present

## 2018-09-13 DIAGNOSIS — R296 Repeated falls: Secondary | ICD-10-CM | POA: Diagnosis not present

## 2018-09-13 NOTE — Telephone Encounter (Signed)
Copied from [place]. Topic: Quick Communication - Home Health Verbal Orders >> Sep 13, 2018  2:49 PM R[place]ford Nail, [place] wrote: Caller/AgencyConstance Haw, physical therapist with [facility] Number: [phone] (Can leave a voicemail) Requesting OT/PT/Skilled Nursing/Social Work/Speech Therapy: Physical therapy Frequency:  2x a week for 2 weeks 1x a week for 1 week 2x a week for 2 weeks

## 2018-09-13 NOTE — Telephone Encounter (Signed)
OK to order as requested.

## 2018-09-13 NOTE — Telephone Encounter (Signed)
Called patient and LMOVM to return call  [place] for [facility] to Discuss results / PCP / recommendations / Schedule patient  Rivka Spring with [facility] and left a voice message with the verbal OK per Dr. Elease Hashimoto for PT orders.

## 2018-09-13 NOTE — Telephone Encounter (Signed)
Please advise 

## 2018-09-15 DIAGNOSIS — M6281 Muscle weakness (generalized): Secondary | ICD-10-CM | POA: Diagnosis not present

## 2018-09-15 DIAGNOSIS — S062X0D Diffuse traumatic brain injury without loss of consciousness, subsequent encounter: Secondary | ICD-10-CM | POA: Diagnosis not present

## 2018-09-15 DIAGNOSIS — R296 Repeated falls: Secondary | ICD-10-CM | POA: Diagnosis not present

## 2018-09-15 DIAGNOSIS — R413 Other amnesia: Secondary | ICD-10-CM | POA: Diagnosis not present

## 2018-09-15 DIAGNOSIS — R2681 Unsteadiness on feet: Secondary | ICD-10-CM | POA: Diagnosis not present

## 2018-09-19 DIAGNOSIS — R413 Other amnesia: Secondary | ICD-10-CM | POA: Diagnosis not present

## 2018-09-19 DIAGNOSIS — M6281 Muscle weakness (generalized): Secondary | ICD-10-CM | POA: Diagnosis not present

## 2018-09-19 DIAGNOSIS — R296 Repeated falls: Secondary | ICD-10-CM | POA: Diagnosis not present

## 2018-09-19 DIAGNOSIS — R2681 Unsteadiness on feet: Secondary | ICD-10-CM | POA: Diagnosis not present

## 2018-09-19 DIAGNOSIS — S062X0D Diffuse traumatic brain injury without loss of consciousness, subsequent encounter: Secondary | ICD-10-CM | POA: Diagnosis not present

## 2018-09-20 NOTE — Telephone Encounter (Signed)
[name] called and said that [facility] stated that Eric George has to have a face to face visit for his therapy to continue and can also be a virtual visit. Patient has a Doxy on 09/27/18 at 2:15pm. Wife verbalized an understanding.

## 2018-09-21 DIAGNOSIS — M6281 Muscle weakness (generalized): Secondary | ICD-10-CM | POA: Diagnosis not present

## 2018-09-21 DIAGNOSIS — S062X0D Diffuse traumatic brain injury without loss of consciousness, subsequent encounter: Secondary | ICD-10-CM | POA: Diagnosis not present

## 2018-09-21 DIAGNOSIS — R2681 Unsteadiness on feet: Secondary | ICD-10-CM | POA: Diagnosis not present

## 2018-09-21 DIAGNOSIS — R296 Repeated falls: Secondary | ICD-10-CM | POA: Diagnosis not present

## 2018-09-21 DIAGNOSIS — R413 Other amnesia: Secondary | ICD-10-CM | POA: Diagnosis not present

## 2018-09-27 ENCOUNTER — Other Ambulatory Visit: Payer: Self-pay

## 2018-09-27 ENCOUNTER — Ambulatory Visit (INDEPENDENT_AMBULATORY_CARE_PROVIDER_SITE_OTHER): Payer: Medicare Other | Admitting: Family Medicine

## 2018-09-27 ENCOUNTER — Encounter: Payer: Self-pay | Admitting: Family Medicine

## 2018-09-27 DIAGNOSIS — R296 Repeated falls: Secondary | ICD-10-CM | POA: Diagnosis not present

## 2018-09-27 DIAGNOSIS — R2681 Unsteadiness on feet: Secondary | ICD-10-CM | POA: Diagnosis not present

## 2018-09-27 DIAGNOSIS — E785 Hyperlipidemia, unspecified: Secondary | ICD-10-CM | POA: Diagnosis not present

## 2018-09-27 DIAGNOSIS — R413 Other amnesia: Secondary | ICD-10-CM | POA: Diagnosis not present

## 2018-09-27 DIAGNOSIS — I10 Essential (primary) hypertension: Secondary | ICD-10-CM | POA: Diagnosis not present

## 2018-09-27 DIAGNOSIS — S062X0D Diffuse traumatic brain injury without loss of consciousness, subsequent encounter: Secondary | ICD-10-CM | POA: Diagnosis not present

## 2018-09-27 DIAGNOSIS — M6281 Muscle weakness (generalized): Secondary | ICD-10-CM | POA: Diagnosis not present
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
## 2018-10-03 ENCOUNTER — Telehealth: Payer: Self-pay | Admitting: Family Medicine

## 2018-10-03 DIAGNOSIS — R296 Repeated falls: Secondary | ICD-10-CM | POA: Diagnosis not present

## 2018-10-03 DIAGNOSIS — R2681 Unsteadiness on feet: Secondary | ICD-10-CM | POA: Diagnosis not present

## 2018-10-03 DIAGNOSIS — R413 Other amnesia: Secondary | ICD-10-CM | POA: Diagnosis not present

## 2018-10-03 DIAGNOSIS — S062X0D Diffuse traumatic brain injury without loss of consciousness, subsequent encounter: Secondary | ICD-10-CM | POA: Diagnosis not present

## 2018-10-03 DIAGNOSIS — M6281 Muscle weakness (generalized): Secondary | ICD-10-CM | POA: Diagnosis not present

## 2018-10-03 NOTE — Telephone Encounter (Signed)
ok 

## 2018-10-03 NOTE — Telephone Encounter (Signed)
Copied from [place] [id number]. Topic: Quick Communication - See Telephone Encounter >> Oct 03, 2018  1:15 PM Burchel, Abbi R wrote: CRM for notification. See Telephone encounter for: 10/03/18.  Laurey Arrow (PT w/[facility] [phone]) requesting v/o to extend PT 1x4wk (thru 11/05/2018).

## 2018-10-03 NOTE — Telephone Encounter (Signed)
Please advise 

## 2018-10-03 NOTE — Telephone Encounter (Signed)
Eric George with [facility] for [facility] to Discuss results / PCP / recommendations / Schedule patient  Left a message to give him verbal OK per Dr. Elease Hashimoto for PT orders.  CRM Created in case

## 2018-10-06 DIAGNOSIS — R413 Other amnesia: Secondary | ICD-10-CM | POA: Diagnosis not present

## 2018-10-06 DIAGNOSIS — R296 Repeated falls: Secondary | ICD-10-CM | POA: Diagnosis not present

## 2018-10-06 DIAGNOSIS — M6281 Muscle weakness (generalized): Secondary | ICD-10-CM | POA: Diagnosis not present

## 2018-10-06 DIAGNOSIS — S062X0D Diffuse traumatic brain injury without loss of consciousness, subsequent encounter: Secondary | ICD-10-CM | POA: Diagnosis not present

## 2018-10-06 DIAGNOSIS — R2681 Unsteadiness on feet: Secondary | ICD-10-CM | POA: Diagnosis not present

## 2018-10-10 DIAGNOSIS — R2681 Unsteadiness on feet: Secondary | ICD-10-CM | POA: Diagnosis not present

## 2018-10-10 DIAGNOSIS — S062X0D Diffuse traumatic brain injury without loss of consciousness, subsequent encounter: Secondary | ICD-10-CM | POA: Diagnosis not present

## 2018-10-10 DIAGNOSIS — R296 Repeated falls: Secondary | ICD-10-CM | POA: Diagnosis not present

## 2018-10-10 DIAGNOSIS — M6281 Muscle weakness (generalized): Secondary | ICD-10-CM | POA: Diagnosis not present

## 2018-10-10 DIAGNOSIS — R413 Other amnesia: Secondary | ICD-10-CM | POA: Diagnosis not present

## 2018-10-11 ENCOUNTER — Other Ambulatory Visit: Payer: Self-pay | Admitting: Family Medicine

## 2018-10-13 DIAGNOSIS — R2681 Unsteadiness on feet: Secondary | ICD-10-CM | POA: Diagnosis not present

## 2018-10-13 DIAGNOSIS — R413 Other amnesia: Secondary | ICD-10-CM | POA: Diagnosis not present

## 2018-10-13 DIAGNOSIS — R296 Repeated falls: Secondary | ICD-10-CM | POA: Diagnosis not present

## 2018-10-13 DIAGNOSIS — S062X0D Diffuse traumatic brain injury without loss of consciousness, subsequent encounter: Secondary | ICD-10-CM | POA: Diagnosis not present

## 2018-10-13 DIAGNOSIS — M6281 Muscle weakness (generalized): Secondary | ICD-10-CM | POA: Diagnosis not present

## 2018-10-14 ENCOUNTER — Ambulatory Visit: Payer: Medicare Other | Admitting: Family Medicine

## 2018-10-19 DIAGNOSIS — R2681 Unsteadiness on feet: Secondary | ICD-10-CM | POA: Diagnosis not present

## 2018-10-19 DIAGNOSIS — R413 Other amnesia: Secondary | ICD-10-CM | POA: Diagnosis not present

## 2018-10-19 DIAGNOSIS — M6281 Muscle weakness (generalized): Secondary | ICD-10-CM | POA: Diagnosis not present

## 2018-10-19 DIAGNOSIS — S062X0D Diffuse traumatic brain injury without loss of consciousness, subsequent encounter: Secondary | ICD-10-CM | POA: Diagnosis not present

## 2018-10-19 DIAGNOSIS — R296 Repeated falls: Secondary | ICD-10-CM | POA: Diagnosis not present

## 2018-10-20 DIAGNOSIS — K5730 Diverticulosis of large intestine without perforation or abscess without bleeding: Secondary | ICD-10-CM | POA: Diagnosis not present

## 2018-10-20 DIAGNOSIS — K219 Gastro-esophageal reflux disease without esophagitis: Secondary | ICD-10-CM | POA: Diagnosis not present

## 2018-10-20 DIAGNOSIS — K449 Diaphragmatic hernia without obstruction or gangrene: Secondary | ICD-10-CM | POA: Diagnosis not present

## 2018-10-27 ENCOUNTER — Telehealth: Payer: Self-pay | Admitting: Family Medicine

## 2018-10-27 DIAGNOSIS — R2681 Unsteadiness on feet: Secondary | ICD-10-CM | POA: Diagnosis not present

## 2018-10-27 DIAGNOSIS — R413 Other amnesia: Secondary | ICD-10-CM | POA: Diagnosis not present

## 2018-10-27 DIAGNOSIS — M6281 Muscle weakness (generalized): Secondary | ICD-10-CM | POA: Diagnosis not present

## 2018-10-27 DIAGNOSIS — S062X0D Diffuse traumatic brain injury without loss of consciousness, subsequent encounter: Secondary | ICD-10-CM | POA: Diagnosis not present

## 2018-10-27 DIAGNOSIS — R296 Repeated falls: Secondary | ICD-10-CM | POA: Diagnosis not present

## 2018-10-27 NOTE — Telephone Encounter (Signed)
Home Health Verbal Orders - Caller/Agency: [facility] Callback Number: [phone] Requesting OT/PT/Skilled Nursing/Social Work/Speech Therapy: PT Frequency: 1x 6w

## 2018-10-27 NOTE — Telephone Encounter (Signed)
OK 

## 2018-10-27 NOTE — Telephone Encounter (Signed)
Ok to give orders  °

## 2018-10-28 NOTE — Telephone Encounter (Signed)
[facility] and gave him the verbal OK from Dr. Elease Hashimoto. Laurey Arrow verbalized an understanding.

## 2018-11-02 DIAGNOSIS — S062X0D Diffuse traumatic brain injury without loss of consciousness, subsequent encounter: Secondary | ICD-10-CM | POA: Diagnosis not present

## 2018-11-02 DIAGNOSIS — M6281 Muscle weakness (generalized): Secondary | ICD-10-CM | POA: Diagnosis not present

## 2018-11-02 DIAGNOSIS — R2681 Unsteadiness on feet: Secondary | ICD-10-CM | POA: Diagnosis not present

## 2018-11-02 DIAGNOSIS — R413 Other amnesia: Secondary | ICD-10-CM | POA: Diagnosis not present

## 2018-11-02 DIAGNOSIS — R296 Repeated falls: Secondary | ICD-10-CM | POA: Diagnosis not present

## 2018-11-08 DIAGNOSIS — S062X0D Diffuse traumatic brain injury without loss of consciousness, subsequent encounter: Secondary | ICD-10-CM | POA: Diagnosis not present

## 2018-11-08 DIAGNOSIS — R413 Other amnesia: Secondary | ICD-10-CM | POA: Diagnosis not present

## 2018-11-08 DIAGNOSIS — R2681 Unsteadiness on feet: Secondary | ICD-10-CM | POA: Diagnosis not present

## 2018-11-08 DIAGNOSIS — R296 Repeated falls: Secondary | ICD-10-CM | POA: Diagnosis not present

## 2018-11-08 DIAGNOSIS — M6281 Muscle weakness (generalized): Secondary | ICD-10-CM | POA: Diagnosis not present

## 2018-11-12 DIAGNOSIS — S062X0D Diffuse traumatic brain injury without loss of consciousness, subsequent encounter: Secondary | ICD-10-CM | POA: Diagnosis not present

## 2018-11-12 DIAGNOSIS — R413 Other amnesia: Secondary | ICD-10-CM | POA: Diagnosis not present

## 2018-11-12 DIAGNOSIS — R2681 Unsteadiness on feet: Secondary | ICD-10-CM | POA: Diagnosis not present

## 2018-11-12 DIAGNOSIS — R296 Repeated falls: Secondary | ICD-10-CM | POA: Diagnosis not present

## 2018-11-12 DIAGNOSIS — M6281 Muscle weakness (generalized): Secondary | ICD-10-CM | POA: Diagnosis not present

## 2018-11-14 ENCOUNTER — Other Ambulatory Visit: Payer: Self-pay | Admitting: Family Medicine

## 2018-11-18 DIAGNOSIS — R2681 Unsteadiness on feet: Secondary | ICD-10-CM | POA: Diagnosis not present

## 2018-11-18 DIAGNOSIS — R413 Other amnesia: Secondary | ICD-10-CM | POA: Diagnosis not present

## 2018-11-18 DIAGNOSIS — S062X0D Diffuse traumatic brain injury without loss of consciousness, subsequent encounter: Secondary | ICD-10-CM | POA: Diagnosis not present

## 2018-11-18 DIAGNOSIS — M6281 Muscle weakness (generalized): Secondary | ICD-10-CM | POA: Diagnosis not present

## 2018-11-18 DIAGNOSIS — R296 Repeated falls: Secondary | ICD-10-CM | POA: Diagnosis not present

## 2018-11-23 DIAGNOSIS — S062X0D Diffuse traumatic brain injury without loss of consciousness, subsequent encounter: Secondary | ICD-10-CM | POA: Diagnosis not present

## 2018-11-23 DIAGNOSIS — M6281 Muscle weakness (generalized): Secondary | ICD-10-CM | POA: Diagnosis not present

## 2018-11-23 DIAGNOSIS — R2681 Unsteadiness on feet: Secondary | ICD-10-CM | POA: Diagnosis not present

## 2018-11-23 DIAGNOSIS — R413 Other amnesia: Secondary | ICD-10-CM | POA: Diagnosis not present

## 2018-11-23 DIAGNOSIS — R296 Repeated falls: Secondary | ICD-10-CM | POA: Diagnosis not present

## 2018-12-05 ENCOUNTER — Other Ambulatory Visit: Payer: Self-pay | Admitting: Family Medicine

## 2018-12-05 ENCOUNTER — Other Ambulatory Visit: Payer: Self-pay

## 2018-12-08 DIAGNOSIS — H6123 Impacted cerumen, bilateral: Secondary | ICD-10-CM | POA: Diagnosis not present

## 2018-12-08 DIAGNOSIS — R413 Other amnesia: Secondary | ICD-10-CM | POA: Diagnosis not present

## 2018-12-08 DIAGNOSIS — M6281 Muscle weakness (generalized): Secondary | ICD-10-CM | POA: Diagnosis not present

## 2018-12-08 DIAGNOSIS — R2681 Unsteadiness on feet: Secondary | ICD-10-CM | POA: Diagnosis not present

## 2018-12-08 DIAGNOSIS — R296 Repeated falls: Secondary | ICD-10-CM | POA: Diagnosis not present

## 2018-12-08 DIAGNOSIS — S062X0D Diffuse traumatic brain injury without loss of consciousness, subsequent encounter: Secondary | ICD-10-CM | POA: Diagnosis not present

## 2018-12-12 DIAGNOSIS — M6281 Muscle weakness (generalized): Secondary | ICD-10-CM | POA: Diagnosis not present

## 2018-12-12 DIAGNOSIS — R296 Repeated falls: Secondary | ICD-10-CM | POA: Diagnosis not present

## 2018-12-12 DIAGNOSIS — R2681 Unsteadiness on feet: Secondary | ICD-10-CM | POA: Diagnosis not present

## 2018-12-12 DIAGNOSIS — R413 Other amnesia: Secondary | ICD-10-CM | POA: Diagnosis not present

## 2018-12-12 DIAGNOSIS — S062X0D Diffuse traumatic brain injury without loss of consciousness, subsequent encounter: Secondary | ICD-10-CM | POA: Diagnosis not present

## 2018-12-13 ENCOUNTER — Telehealth: Payer: Self-pay | Admitting: Family Medicine

## 2018-12-13 DIAGNOSIS — S062X0D Diffuse traumatic brain injury without loss of consciousness, subsequent encounter: Secondary | ICD-10-CM | POA: Diagnosis not present

## 2018-12-13 DIAGNOSIS — R413 Other amnesia: Secondary | ICD-10-CM | POA: Diagnosis not present

## 2018-12-13 DIAGNOSIS — R296 Repeated falls: Secondary | ICD-10-CM | POA: Diagnosis not present

## 2018-12-13 DIAGNOSIS — M6281 Muscle weakness (generalized): Secondary | ICD-10-CM | POA: Diagnosis not present

## 2018-12-13 DIAGNOSIS — R2681 Unsteadiness on feet: Secondary | ICD-10-CM | POA: Diagnosis not present

## 2018-12-13 NOTE — Telephone Encounter (Signed)
OK to renew services as requested.

## 2018-12-13 NOTE — Telephone Encounter (Signed)
Please advise 

## 2018-12-13 NOTE — Telephone Encounter (Signed)
Home Health Verbal Orders - Caller/Agency: [facility]  Callback Number: [phone] Requesting OT/PT/Skilled Nursing/Social Work/Speech Therapy:  Frequency: would like to pt home care for another month  1 w 5

## 2018-12-13 NOTE — Telephone Encounter (Signed)
See note

## 2018-12-14 NOTE — Telephone Encounter (Signed)
[facility] with [facility] and gave him verbal OK per Dr. Elease Hashimoto. Laurey Arrow verified an understanding.

## 2018-12-22 DIAGNOSIS — R413 Other amnesia: Secondary | ICD-10-CM | POA: Diagnosis not present

## 2018-12-22 DIAGNOSIS — R296 Repeated falls: Secondary | ICD-10-CM | POA: Diagnosis not present

## 2018-12-22 DIAGNOSIS — R2681 Unsteadiness on feet: Secondary | ICD-10-CM | POA: Diagnosis not present

## 2018-12-22 DIAGNOSIS — M6281 Muscle weakness (generalized): Secondary | ICD-10-CM | POA: Diagnosis not present

## 2018-12-22 DIAGNOSIS — S062X0D Diffuse traumatic brain injury without loss of consciousness, subsequent encounter: Secondary | ICD-10-CM | POA: Diagnosis not present

## 2018-12-28 DIAGNOSIS — M6281 Muscle weakness (generalized): Secondary | ICD-10-CM | POA: Diagnosis not present

## 2018-12-28 DIAGNOSIS — R2681 Unsteadiness on feet: Secondary | ICD-10-CM | POA: Diagnosis not present

## 2018-12-28 DIAGNOSIS — S062X0D Diffuse traumatic brain injury without loss of consciousness, subsequent encounter: Secondary | ICD-10-CM | POA: Diagnosis not present

## 2018-12-28 DIAGNOSIS — R296 Repeated falls: Secondary | ICD-10-CM | POA: Diagnosis not present

## 2018-12-28 DIAGNOSIS — R413 Other amnesia: Secondary | ICD-10-CM | POA: Diagnosis not present

## 2019-01-06 DIAGNOSIS — S062X0D Diffuse traumatic brain injury without loss of consciousness, subsequent encounter: Secondary | ICD-10-CM | POA: Diagnosis not present

## 2019-01-06 DIAGNOSIS — R2681 Unsteadiness on feet: Secondary | ICD-10-CM | POA: Diagnosis not present

## 2019-01-06 DIAGNOSIS — R296 Repeated falls: Secondary | ICD-10-CM | POA: Diagnosis not present

## 2019-01-06 DIAGNOSIS — R413 Other amnesia: Secondary | ICD-10-CM | POA: Diagnosis not present

## 2019-01-06 DIAGNOSIS — M6281 Muscle weakness (generalized): Secondary | ICD-10-CM | POA: Diagnosis not present

## 2019-01-11 ENCOUNTER — Telehealth: Payer: Self-pay

## 2019-01-11 ENCOUNTER — Encounter: Payer: Self-pay | Admitting: Family Medicine

## 2019-01-11 ENCOUNTER — Ambulatory Visit (INDEPENDENT_AMBULATORY_CARE_PROVIDER_SITE_OTHER): Payer: Medicare Other | Admitting: Family Medicine

## 2019-01-11 VITALS — BP 102/64 | HR 42 | Temp 97.6°F | Wt 176.6 lb

## 2019-01-11 DIAGNOSIS — R001 Bradycardia, unspecified: Secondary | ICD-10-CM

## 2019-01-11 DIAGNOSIS — I499 Cardiac arrhythmia, unspecified: Secondary | ICD-10-CM | POA: Diagnosis not present

## 2019-01-11 DIAGNOSIS — R2681 Unsteadiness on feet: Secondary | ICD-10-CM | POA: Diagnosis not present

## 2019-01-11 DIAGNOSIS — R413 Other amnesia: Secondary | ICD-10-CM | POA: Diagnosis not present

## 2019-01-11 DIAGNOSIS — R110 Nausea: Secondary | ICD-10-CM

## 2019-01-11 DIAGNOSIS — M6281 Muscle weakness (generalized): Secondary | ICD-10-CM | POA: Diagnosis not present

## 2019-01-11 DIAGNOSIS — S062X0D Diffuse traumatic brain injury without loss of consciousness, subsequent encounter: Secondary | ICD-10-CM | POA: Diagnosis not present

## 2019-01-11 DIAGNOSIS — I498 Other specified cardiac arrhythmias: Secondary | ICD-10-CM

## 2019-01-11 DIAGNOSIS — R296 Repeated falls: Secondary | ICD-10-CM | POA: Diagnosis not present
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
## 2019-01-11 NOTE — Telephone Encounter (Signed)
Copied from [place] [id number] [id number]. Topic: General - Other >> Jan 11, 2019 11:52 AM Keene Breath wrote: Reason for CRM: [facility] called to inform the nurse or the doctor of the patient's low vital ratings - HR 40-46, BP 102/48.  Tried the office but Jinny Blossom was in with the patient.  Please call caregiver to discuss.  CB# [phone]

## 2019-01-11 NOTE — Telephone Encounter (Signed)
Spoke with [employer]. He states pt reports cold chills, nausea, no fever. Pt vitals are low after multiple checks.   Spoke with Dr. Elease Hashimoto and he recommends pt be seen in office and EKG performed. Based on these results he will determine next course of action. Pt/wife agreed, appt scheduled for today. Pt/wife advised if any increase in symptoms such as headache, shob, neurological symptoms pt needs to seek care in ED immediately. Pt/wife voiced understanding.

## 2019-01-11 NOTE — Patient Instructions (Signed)
Premature Ventricular Contraction  A premature ventricular contraction (PVC) is a common kind of irregular heartbeat (arrhythmia). These contractions are extra heartbeats that start in the ventricles of the heart and occur too early in the normal sequence. During the PVC, the heart's normal electrical pathway is not used, so the beat is shorter and less effective. In most cases, these contractions come and go and do not require treatment. What are the causes? Common causes of the condition include:  Smoking.  Drinking alcohol.  Certain medicines.  Some illegal drugs.  Stress.  Caffeine. Certain medical conditions can also cause PVCs:  Heart failure.  Heart attack, or coronary artery disease.  Heart valve problems.  Changes in minerals in the blood (electrolytes).  Low blood oxygen levels or high carbon dioxide levels. In many cases, the cause of this condition is not known. What are the signs or symptoms? The main symptom of this condition is fast or skipped heartbeats (palpitations). Other symptoms include:  Chest pain.  Shortness of breath.  Feeling tired.  Dizziness.  Difficulty exercising. In some cases, there are no symptoms. How is this diagnosed? This condition may be diagnosed based on:  Your medical history.  A physical exam. During the exam, the health care provider will check for irregular heartbeats.  Tests, such as: ? An ECG (electrocardiogram) to monitor the electrical activity of your heart. ? An ambulatory cardiac monitor. This device records your heartbeats for 24 hours or more. ? Stress tests to see how exercise affects your heart rhythm and blood supply. ? An echocardiogram. This test uses sound waves (ultrasound) to produce an image of your heart. ? An electrophysiology study (EPS). This test checks for electrical problems in your heart. How is this treated? Treatment for this condition depends on any underlying conditions, the type of PVCs  that you are having, and how much the symptoms are interfering with your daily life. Possible treatments include:  Avoiding things that cause premature contractions (triggers). These include caffeine and alcohol.  Taking medicines if symptoms are severe or if the extra heartbeats are frequent.  Getting treatment for underlying conditions that cause PVCs.  Having an implantable cardioverter defibrillator (ICD), if you are at risk for a serious arrhythmia. The ICD is a small device that is inserted into your chest to monitor your heartbeat. When it senses an irregular heartbeat, it sends a shock to bring the heartbeat back to normal.  Having a procedure to destroy the portion of the heart tissue that sends out abnormal signals (catheter ablation). In some cases, no treatment is required. Follow these instructions at home: Lifestyle  Do not use any products that contain nicotine or tobacco, such as cigarettes, e-cigarettes, and chewing tobacco. If you need help quitting, ask your health care provider.  Do not use illegal drugs.  Exercise regularly. Ask your health care provider what type of exercise is safe for you.  Try to get at least 7-9 hours of sleep each night, or as much as recommended by your health care provider.  Find healthy ways to manage stress. Avoid stressful situations when possible. Alcohol use  Do not drink alcohol if: ? Your health care provider tells you not to drink. ? You are pregnant, may be pregnant, or are planning to become pregnant. ? Alcohol triggers your episodes.  If you drink alcohol: ? Limit how much you use to:  0-1 drink a day for women.  0-2 drinks a day for men.  Be aware of how much   alcohol is in your drink. In the [place] one drink equals one 12 oz bottle of beer (355 mL), one 5 oz glass of wine (148 mL), or one 1 oz glass of hard liquor (44 mL). General instructions  Take over-the-counter and prescription medicines only as told by your  health care provider.  If caffeine triggers episodes of PVC, do not eat, drink, or use anything with caffeine in it.  Keep all follow-up visits as told by your health care provider. This is important. Contact a health care provider if you:  Feel palpitations. Get help right away if you:  Have chest pain.  Have shortness of breath.  Have sweating for no reason.  Have nausea and vomiting.  Become light-headed or you faint. Summary  A premature ventricular contraction (PVC) is a common kind of irregular heartbeat (arrhythmia).  In most cases, these contractions come and go and do not require treatment.  You may need to wear an ambulatory cardiac monitor. This records your heartbeats for 24 hours or more.  Treatment depends on any underlying conditions, the type of PVCs that you are having, and how much the symptoms are interfering with your daily life. This information is not intended to replace advice given to you by your health care provider. Make sure you discuss any questions you have with your health care provider. Document Released: 12/06/2003 Document Revised: 01/13/2018 Document Reviewed: 01/13/2018 Elsevier Patient Education  2020 Elsevier Inc.  

## 2019-01-12 ENCOUNTER — Encounter: Payer: Self-pay | Admitting: Family Medicine

## 2019-01-13 NOTE — Telephone Encounter (Signed)
BP is still up and down / it was 113/51 this morning.  Its going from low to high with Pulse from 38 to 81./ around 10:15am this morning BP was 77/52 with a pulse of 81 and she doesn't know what to do. Please call [name] on her cell [phone]

## 2019-01-18 DIAGNOSIS — R413 Other amnesia: Secondary | ICD-10-CM | POA: Diagnosis not present

## 2019-01-18 DIAGNOSIS — M6281 Muscle weakness (generalized): Secondary | ICD-10-CM | POA: Diagnosis not present

## 2019-01-18 DIAGNOSIS — S062X0D Diffuse traumatic brain injury without loss of consciousness, subsequent encounter: Secondary | ICD-10-CM | POA: Diagnosis not present

## 2019-01-18 DIAGNOSIS — R296 Repeated falls: Secondary | ICD-10-CM | POA: Diagnosis not present

## 2019-01-18 DIAGNOSIS — R2681 Unsteadiness on feet: Secondary | ICD-10-CM | POA: Diagnosis not present

## 2019-01-25 DIAGNOSIS — M6281 Muscle weakness (generalized): Secondary | ICD-10-CM | POA: Diagnosis not present

## 2019-01-25 DIAGNOSIS — R2681 Unsteadiness on feet: Secondary | ICD-10-CM | POA: Diagnosis not present

## 2019-01-25 DIAGNOSIS — R413 Other amnesia: Secondary | ICD-10-CM | POA: Diagnosis not present

## 2019-01-25 DIAGNOSIS — S062X0D Diffuse traumatic brain injury without loss of consciousness, subsequent encounter: Secondary | ICD-10-CM | POA: Diagnosis not present

## 2019-01-25 DIAGNOSIS — R296 Repeated falls: Secondary | ICD-10-CM | POA: Diagnosis not present

## 2019-02-02 ENCOUNTER — Encounter: Payer: Self-pay | Admitting: Family Medicine

## 2019-02-02 DIAGNOSIS — M6281 Muscle weakness (generalized): Secondary | ICD-10-CM | POA: Diagnosis not present

## 2019-02-02 DIAGNOSIS — R413 Other amnesia: Secondary | ICD-10-CM | POA: Diagnosis not present

## 2019-02-02 DIAGNOSIS — S062X0D Diffuse traumatic brain injury without loss of consciousness, subsequent encounter: Secondary | ICD-10-CM | POA: Diagnosis not present

## 2019-02-02 DIAGNOSIS — R296 Repeated falls: Secondary | ICD-10-CM | POA: Diagnosis not present

## 2019-02-02 DIAGNOSIS — R2681 Unsteadiness on feet: Secondary | ICD-10-CM | POA: Diagnosis not present

## 2019-02-09 ENCOUNTER — Other Ambulatory Visit: Payer: Self-pay

## 2019-02-09 ENCOUNTER — Ambulatory Visit (INDEPENDENT_AMBULATORY_CARE_PROVIDER_SITE_OTHER): Payer: Medicare Other

## 2019-02-09 DIAGNOSIS — Z23 Encounter for immunization: Secondary | ICD-10-CM

## 2019-02-10 DIAGNOSIS — M6281 Muscle weakness (generalized): Secondary | ICD-10-CM | POA: Diagnosis not present

## 2019-02-10 DIAGNOSIS — R2681 Unsteadiness on feet: Secondary | ICD-10-CM | POA: Diagnosis not present

## 2019-02-10 DIAGNOSIS — R296 Repeated falls: Secondary | ICD-10-CM | POA: Diagnosis not present

## 2019-02-10 DIAGNOSIS — S062X0D Diffuse traumatic brain injury without loss of consciousness, subsequent encounter: Secondary | ICD-10-CM | POA: Diagnosis not present

## 2019-02-10 DIAGNOSIS — R413 Other amnesia: Secondary | ICD-10-CM | POA: Diagnosis not present

## 2019-02-13 DIAGNOSIS — R296 Repeated falls: Secondary | ICD-10-CM | POA: Diagnosis not present

## 2019-02-13 DIAGNOSIS — M6281 Muscle weakness (generalized): Secondary | ICD-10-CM | POA: Diagnosis not present

## 2019-02-13 DIAGNOSIS — R413 Other amnesia: Secondary | ICD-10-CM | POA: Diagnosis not present

## 2019-02-13 DIAGNOSIS — S062X0D Diffuse traumatic brain injury without loss of consciousness, subsequent encounter: Secondary | ICD-10-CM | POA: Diagnosis not present

## 2019-02-13 DIAGNOSIS — R2681 Unsteadiness on feet: Secondary | ICD-10-CM | POA: Diagnosis not present

## 2019-03-03 ENCOUNTER — Telehealth: Payer: Self-pay

## 2019-03-03 DIAGNOSIS — R413 Other amnesia: Secondary | ICD-10-CM | POA: Diagnosis not present

## 2019-03-03 DIAGNOSIS — R296 Repeated falls: Secondary | ICD-10-CM | POA: Diagnosis not present

## 2019-03-03 DIAGNOSIS — R2681 Unsteadiness on feet: Secondary | ICD-10-CM | POA: Diagnosis not present

## 2019-03-03 DIAGNOSIS — S062X0D Diffuse traumatic brain injury without loss of consciousness, subsequent encounter: Secondary | ICD-10-CM | POA: Diagnosis not present

## 2019-03-03 DIAGNOSIS — M6281 Muscle weakness (generalized): Secondary | ICD-10-CM | POA: Diagnosis not present

## 2019-03-03 NOTE — Telephone Encounter (Signed)
Copied from [place] [id number]. Topic: General - Inquiry >> Mar 03, 2019  3:19 PM Alease Frame wrote: Reason for CRM: Collier Salina from [facility] called in to inform Dr Elease Hashimoto Patient was going to be discharged from therapy but wife is concerned due to covid  so he will coutinue needing therapy for another 2 months .   Collier Salina [phone]

## 2019-03-06 NOTE — Telephone Encounter (Signed)
Cordelia Pen and informed him Dr. Elease Hashimoto is currently out of the office this week. He understood, informed him I was not sure if provider will be checking messages but if needed can see if another provider is ok with addressing

## 2019-03-06 NOTE — Telephone Encounter (Signed)
Spoke with Laurey Arrow and informed him of Dr. Erick Blinks message. He had no additional questions.

## 2019-03-06 NOTE — Telephone Encounter (Signed)
OK to approve service as requested.

## 2019-03-07 DIAGNOSIS — R296 Repeated falls: Secondary | ICD-10-CM | POA: Diagnosis not present

## 2019-03-07 DIAGNOSIS — R413 Other amnesia: Secondary | ICD-10-CM | POA: Diagnosis not present

## 2019-03-07 DIAGNOSIS — M6281 Muscle weakness (generalized): Secondary | ICD-10-CM | POA: Diagnosis not present

## 2019-03-07 DIAGNOSIS — S062X0D Diffuse traumatic brain injury without loss of consciousness, subsequent encounter: Secondary | ICD-10-CM | POA: Diagnosis not present

## 2019-03-07 DIAGNOSIS — R2681 Unsteadiness on feet: Secondary | ICD-10-CM | POA: Diagnosis not present

## 2019-03-11 ENCOUNTER — Other Ambulatory Visit: Payer: Self-pay | Admitting: Family Medicine

## 2019-03-12 DIAGNOSIS — S062X0D Diffuse traumatic brain injury without loss of consciousness, subsequent encounter: Secondary | ICD-10-CM | POA: Diagnosis not present

## 2019-03-12 DIAGNOSIS — R413 Other amnesia: Secondary | ICD-10-CM | POA: Diagnosis not present

## 2019-03-12 DIAGNOSIS — M6281 Muscle weakness (generalized): Secondary | ICD-10-CM | POA: Diagnosis not present

## 2019-03-12 DIAGNOSIS — R2681 Unsteadiness on feet: Secondary | ICD-10-CM | POA: Diagnosis not present

## 2019-03-12 DIAGNOSIS — R296 Repeated falls: Secondary | ICD-10-CM | POA: Diagnosis not present

## 2019-03-13 NOTE — Telephone Encounter (Signed)
Refill for one year 

## 2019-03-14 ENCOUNTER — Encounter: Payer: Self-pay | Admitting: Family Medicine

## 2019-03-23 DIAGNOSIS — R413 Other amnesia: Secondary | ICD-10-CM | POA: Diagnosis not present

## 2019-03-23 DIAGNOSIS — M6281 Muscle weakness (generalized): Secondary | ICD-10-CM | POA: Diagnosis not present

## 2019-03-23 DIAGNOSIS — S062X0D Diffuse traumatic brain injury without loss of consciousness, subsequent encounter: Secondary | ICD-10-CM | POA: Diagnosis not present

## 2019-03-23 DIAGNOSIS — R296 Repeated falls: Secondary | ICD-10-CM | POA: Diagnosis not present

## 2019-03-23 DIAGNOSIS — R2681 Unsteadiness on feet: Secondary | ICD-10-CM | POA: Diagnosis not present

## 2019-03-27 DIAGNOSIS — L570 Actinic keratosis: Secondary | ICD-10-CM | POA: Diagnosis not present

## 2019-03-27 DIAGNOSIS — D225 Melanocytic nevi of trunk: Secondary | ICD-10-CM | POA: Diagnosis not present

## 2019-03-27 DIAGNOSIS — D1801 Hemangioma of skin and subcutaneous tissue: Secondary | ICD-10-CM | POA: Diagnosis not present

## 2019-03-27 DIAGNOSIS — L821 Other seborrheic keratosis: Secondary | ICD-10-CM | POA: Diagnosis not present

## 2019-03-27 DIAGNOSIS — L814 Other melanin hyperpigmentation: Secondary | ICD-10-CM | POA: Diagnosis not present

## 2019-03-27 DIAGNOSIS — Z85828 Personal history of other malignant neoplasm of skin: Secondary | ICD-10-CM | POA: Diagnosis not present

## 2019-04-04 DIAGNOSIS — S062X0D Diffuse traumatic brain injury without loss of consciousness, subsequent encounter: Secondary | ICD-10-CM | POA: Diagnosis not present

## 2019-04-04 DIAGNOSIS — R413 Other amnesia: Secondary | ICD-10-CM | POA: Diagnosis not present

## 2019-04-04 DIAGNOSIS — R2681 Unsteadiness on feet: Secondary | ICD-10-CM | POA: Diagnosis not present

## 2019-04-04 DIAGNOSIS — M6281 Muscle weakness (generalized): Secondary | ICD-10-CM | POA: Diagnosis not present

## 2019-04-04 DIAGNOSIS — R296 Repeated falls: Secondary | ICD-10-CM | POA: Diagnosis not present

## 2019-04-11 DIAGNOSIS — R296 Repeated falls: Secondary | ICD-10-CM | POA: Diagnosis not present

## 2019-04-11 DIAGNOSIS — R413 Other amnesia: Secondary | ICD-10-CM | POA: Diagnosis not present

## 2019-04-11 DIAGNOSIS — M6281 Muscle weakness (generalized): Secondary | ICD-10-CM | POA: Diagnosis not present

## 2019-04-11 DIAGNOSIS — S062X0D Diffuse traumatic brain injury without loss of consciousness, subsequent encounter: Secondary | ICD-10-CM | POA: Diagnosis not present

## 2019-04-11 DIAGNOSIS — R2681 Unsteadiness on feet: Secondary | ICD-10-CM | POA: Diagnosis not present

## 2019-04-19 ENCOUNTER — Encounter: Payer: Medicare Other | Admitting: Family Medicine

## 2019-04-20 DIAGNOSIS — M6281 Muscle weakness (generalized): Secondary | ICD-10-CM | POA: Diagnosis not present

## 2019-04-20 DIAGNOSIS — R413 Other amnesia: Secondary | ICD-10-CM | POA: Diagnosis not present

## 2019-04-20 DIAGNOSIS — R2681 Unsteadiness on feet: Secondary | ICD-10-CM | POA: Diagnosis not present

## 2019-04-20 DIAGNOSIS — S062X0D Diffuse traumatic brain injury without loss of consciousness, subsequent encounter: Secondary | ICD-10-CM | POA: Diagnosis not present

## 2019-04-20 DIAGNOSIS — R296 Repeated falls: Secondary | ICD-10-CM | POA: Diagnosis not present

## 2019-04-26 ENCOUNTER — Other Ambulatory Visit: Payer: Self-pay

## 2019-04-26 ENCOUNTER — Ambulatory Visit (INDEPENDENT_AMBULATORY_CARE_PROVIDER_SITE_OTHER): Payer: Medicare Other | Admitting: Family Medicine

## 2019-04-26 ENCOUNTER — Encounter: Payer: Self-pay | Admitting: Family Medicine

## 2019-04-26 VITALS — BP 134/76 | HR 68 | Temp 97.6°F | Ht 72.0 in | Wt 178.8 lb

## 2019-04-26 DIAGNOSIS — I10 Essential (primary) hypertension: Secondary | ICD-10-CM | POA: Diagnosis not present

## 2019-04-26 DIAGNOSIS — R7303 Prediabetes: Secondary | ICD-10-CM

## 2019-04-26 DIAGNOSIS — E785 Hyperlipidemia, unspecified: Secondary | ICD-10-CM

## 2019-04-26 DIAGNOSIS — Z9181 History of falling: Secondary | ICD-10-CM

## 2019-04-26 DIAGNOSIS — R6889 Other general symptoms and signs: Secondary | ICD-10-CM

## 2019-04-26 DIAGNOSIS — I498 Other specified cardiac arrhythmias: Secondary | ICD-10-CM | POA: Insufficient documentation

## 2019-04-26 DIAGNOSIS — K219 Gastro-esophageal reflux disease without esophagitis: Secondary | ICD-10-CM | POA: Diagnosis not present

## 2019-04-26 DIAGNOSIS — R634 Abnormal weight loss: Secondary | ICD-10-CM

## 2019-04-26 LAB — BASIC METABOLIC PANEL
BUN: 22 mg/dL (ref 6–23)
CO2: 26 mEq/L (ref 19–32)
Calcium: 9.7 mg/dL (ref 8.4–10.5)
Chloride: 106 mEq/L (ref 96–112)
Creatinine, Ser: 0.97 mg/dL (ref 0.40–1.50)
GFR: 73.3 mL/min (ref >60.00)
Glucose, Bld: 108 mg/dL — ABNORMAL HIGH (ref 70–99)
Potassium: 4.7 mEq/L (ref 3.5–5.1)
Sodium: 140 mEq/L (ref 135–145)

## 2019-04-26 LAB — LIPID PANEL
Cholesterol: 158 mg/dL (ref 0–200)
HDL: 36.4 mg/dL — ABNORMAL LOW (ref >39.00)
LDL Cholesterol: 99 mg/dL (ref 0–99)
NonHDL: 122.02
Total CHOL/HDL Ratio: 4
Triglycerides: 116 mg/dL (ref 0.0–149.0)
VLDL: 23.2 mg/dL (ref 0.0–40.0)

## 2019-04-26 LAB — CBC WITH DIFFERENTIAL/PLATELET
Basophils Absolute: 0 10*3/uL (ref 0.0–0.1)
Basophils Relative: 0.2 % (ref 0.0–3.0)
Eosinophils Absolute: 0.4 10*3/uL (ref 0.0–0.7)
Eosinophils Relative: 4 % (ref 0.0–5.0)
HCT: 46.6 % (ref 39.0–52.0)
Hemoglobin: 15.3 g/dL (ref 13.0–17.0)
Lymphocytes Relative: 12.4 % (ref 12.0–46.0)
Lymphs Abs: 1.3 10*3/uL (ref 0.7–4.0)
MCHC: 32.8 g/dL (ref 30.0–36.0)
MCV: 89 fl (ref 78.0–100.0)
Monocytes Absolute: 0.7 10*3/uL (ref 0.1–1.0)
Monocytes Relative: 7.1 % (ref 3.0–12.0)
Neutro Abs: 7.8 10*3/uL — ABNORMAL HIGH (ref 1.4–7.7)
Neutrophils Relative %: 76.3 % (ref 43.0–77.0)
Platelets: 213 10*3/uL (ref 150.0–400.0)
RBC: 5.24 Mil/uL (ref 4.22–5.81)
RDW: 13.8 % (ref 11.5–15.5)
WBC: 10.2 10*3/uL (ref 4.0–10.5)

## 2019-04-26 LAB — HEPATIC FUNCTION PANEL
ALT: 21 U/L (ref 0–53)
AST: 16 U/L (ref 0–37)
Albumin: 4.3 g/dL (ref 3.5–5.2)
Alkaline Phosphatase: 81 U/L (ref 39–117)
Bilirubin, Direct: 0.1 mg/dL (ref 0.0–0.3)
Total Bilirubin: 0.5 mg/dL (ref 0.2–1.2)
Total Protein: 6.5 g/dL (ref 6.0–8.3)

## 2019-04-26 LAB — HEMOGLOBIN A1C: Hgb A1c MFr Bld: 6 % (ref 4.6–6.5)

## 2019-04-26 LAB — TSH: TSH: 3.3 u[IU]/mL (ref 0.35–4.50)

## 2019-04-26 NOTE — Patient Instructions (Addendum)
Reduce aspirin to 81 mg daily.    Preventive Care 83 Years and Older, Male Preventive care refers to lifestyle choices and visits with your health care provider that can promote health and wellness. This includes:  A yearly physical exam. This is also called an annual well check.  Regular dental and eye exams.  Immunizations.  Screening for certain conditions.  Healthy lifestyle choices, such as diet and exercise. What can I expect for my preventive care visit? Physical exam Your health care provider will check:  Height and weight. These may be used to calculate body mass index (BMI), which is a measurement that tells if you are at a healthy weight.  Heart rate and blood pressure.  Your skin for abnormal spots. Counseling Your health care provider may ask you questions about:  Alcohol, tobacco, and drug use.  Emotional well-being.  Home and relationship well-being.  Sexual activity.  Eating habits.  History of falls.  Memory and ability to understand (cognition).  Work and work Statistician. What immunizations do I need?  Influenza (flu) vaccine  This is recommended every year. Tetanus, diphtheria, and pertussis (Tdap) vaccine  You may need a Td booster every 10 years. Varicella (chickenpox) vaccine  You may need this vaccine if you have not already been vaccinated. Zoster (shingles) vaccine  You may need this after age 30. Pneumococcal conjugate (PCV13) vaccine  One dose is recommended after age 83. Pneumococcal polysaccharide (PPSV23) vaccine  One dose is recommended after age 25. Measles, mumps, and rubella (MMR) vaccine  You may need at least one dose of MMR if you were born in 1957 or later. You may also need a second dose. Meningococcal conjugate (MenACWY) vaccine  You may need this if you have certain conditions. Hepatitis A vaccine  You may need this if you have certain conditions or if you travel or work in places where you may be exposed  to hepatitis A. Hepatitis B vaccine  You may need this if you have certain conditions or if you travel or work in places where you may be exposed to hepatitis B. Haemophilus influenzae type b (Hib) vaccine  You may need this if you have certain conditions. You may receive vaccines as individual doses or as more than one vaccine together in one shot (combination vaccines). Talk with your health care provider about the risks and benefits of combination vaccines. What tests do I need? Blood tests  Lipid and cholesterol levels. These may be checked every 5 years, or more frequently depending on your overall health.  Hepatitis C test.  Hepatitis B test. Screening  Lung cancer screening. You may have this screening every year starting at age 83 if you have a 30-pack-year history of smoking and currently smoke or have quit within the past 15 years.  Colorectal cancer screening. All adults should have this screening starting at age 83 and continuing until age 1. Your health care provider may recommend screening at age 83 if you are at increased risk. You will have tests every 1-10 years, depending on your results and the type of screening test.  Prostate cancer screening. Recommendations will vary depending on your family history and other risks.  Diabetes screening. This is done by checking your blood sugar (glucose) after you have not eaten for a while (fasting). You may have this done every 1-3 years.  Abdominal aortic aneurysm (AAA) screening. You may need this if you are a current or former smoker.  Sexually transmitted disease (STD) testing. Follow  these instructions at home: Eating and drinking  Eat a diet that includes fresh fruits and vegetables, whole grains, lean protein, and low-fat dairy products. Limit your intake of foods with high amounts of sugar, saturated fats, and salt.  Take vitamin and mineral supplements as recommended by your health care provider.  Do not drink  alcohol if your health care provider tells you not to drink.  If you drink alcohol: ? Limit how much you have to 0-2 drinks a day. ? Be aware of how much alcohol is in your drink. In the [place] one drink equals one 12 oz bottle of beer (355 mL), one 5 oz glass of wine (148 mL), or one 1 oz glass of hard liquor (44 mL). Lifestyle  Take daily care of your teeth and gums.  Stay active. Exercise for at least 30 minutes on 5 or more days each week.  Do not use any products that contain nicotine or tobacco, such as cigarettes, e-cigarettes, and chewing tobacco. If you need help quitting, ask your health care provider.  If you are sexually active, practice safe sex. Use a condom or other form of protection to prevent STIs (sexually transmitted infections).  Talk with your health care provider about taking a low-dose aspirin or statin. What's next?  Visit your health care provider once a year for a well check visit.  Ask your health care provider how often you should have your eyes and teeth checked.  Stay up to date on all vaccines. This information is not intended to replace advice given to you by your health care provider. Make sure you discuss any questions you have with your health care provider. Document Released: 05/17/2015 Document Revised: 04/14/2018 Document Reviewed: 04/14/2018 Elsevier Patient Education  2020 [employer].
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
## 2019-04-30 ENCOUNTER — Encounter: Payer: Self-pay | Admitting: Family Medicine

## 2019-05-02 DIAGNOSIS — S062X0D Diffuse traumatic brain injury without loss of consciousness, subsequent encounter: Secondary | ICD-10-CM | POA: Diagnosis not present

## 2019-05-02 DIAGNOSIS — R413 Other amnesia: Secondary | ICD-10-CM | POA: Diagnosis not present

## 2019-05-02 DIAGNOSIS — R296 Repeated falls: Secondary | ICD-10-CM | POA: Diagnosis not present

## 2019-05-02 DIAGNOSIS — R2681 Unsteadiness on feet: Secondary | ICD-10-CM | POA: Diagnosis not present

## 2019-05-02 DIAGNOSIS — M6281 Muscle weakness (generalized): Secondary | ICD-10-CM | POA: Diagnosis not present

## 2019-05-29 ENCOUNTER — Ambulatory Visit (INDEPENDENT_AMBULATORY_CARE_PROVIDER_SITE_OTHER): Payer: Medicare Other | Admitting: Family Medicine

## 2019-05-29 ENCOUNTER — Other Ambulatory Visit: Payer: Self-pay

## 2019-05-29 ENCOUNTER — Encounter: Payer: Self-pay | Admitting: Family Medicine

## 2019-05-29 VITALS — BP 136/70 | HR 39 | Temp 97.9°F | Ht 72.0 in | Wt 180.4 lb

## 2019-05-29 DIAGNOSIS — R21 Rash and other nonspecific skin eruption: Secondary | ICD-10-CM
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
## 2019-05-29 NOTE — Patient Instructions (Signed)
Try some over the counter Lotrimin cream and use twice daily  Keep area as dry as possible.  Touch base in a couple of weeks if not improving.

## 2019-06-01 ENCOUNTER — Ambulatory Visit: Payer: Medicare Other

## 2019-06-08 DIAGNOSIS — H35033 Hypertensive retinopathy, bilateral: Secondary | ICD-10-CM | POA: Diagnosis not present

## 2019-06-08 DIAGNOSIS — H353132 Nonexudative age-related macular degeneration, bilateral, intermediate dry stage: Secondary | ICD-10-CM | POA: Diagnosis not present

## 2019-06-08 DIAGNOSIS — H35373 Puckering of macula, bilateral: Secondary | ICD-10-CM | POA: Diagnosis not present

## 2019-06-08 DIAGNOSIS — H40013 Open angle with borderline findings, low risk, bilateral: Secondary | ICD-10-CM | POA: Diagnosis not present

## 2019-06-08 LAB — HM DIABETES EYE EXAM

## 2019-06-09 ENCOUNTER — Ambulatory Visit: Payer: Medicare Other | Attending: Internal Medicine

## 2019-06-09 DIAGNOSIS — Z23 Encounter for immunization: Secondary | ICD-10-CM | POA: Insufficient documentation

## 2019-06-09 NOTE — Progress Notes (Signed)
   Covid-19 Vaccination Clinic  Name:  CARMELO HIGHSMITH    MRN: [id number] DOB: 06-30-1930  06/09/2019  Mr. Coyle was observed post Covid-19 immunization for 15 minutes without incidence. He was provided with Vaccine Information Sheet and instruction to access the V-Safe system.   Mr. Imperatore was instructed to call 911 with any severe reactions post vaccine: Marland Kitchen Difficulty breathing  . Swelling of your face and throat  . A fast heartbeat  . A bad rash all over your body  . Dizziness and weakness    Immunizations Administered    Name Date Dose VIS Date Route   Pfizer COVID-19 Vaccine 06/09/2019 10:12 AM 0.3 mL 04/14/2019 Intramuscular   Manufacturer: [place]   Lot: [id number]   [place]: [id number]

## 2019-06-19 ENCOUNTER — Other Ambulatory Visit: Payer: Self-pay | Admitting: Family Medicine

## 2019-07-04 ENCOUNTER — Ambulatory Visit: Payer: Medicare Other | Attending: Internal Medicine

## 2019-07-04 DIAGNOSIS — Z23 Encounter for immunization: Secondary | ICD-10-CM

## 2019-07-04 NOTE — Progress Notes (Signed)
   Covid-19 Vaccination Clinic  Name:  Eric George    MRN: [id number] DOB: 1930-05-05  07/04/2019  [name] was observed post Covid-19 immunization for 30 minutes based on pre-vaccination screening without incident. He was provided with Vaccine Information Sheet and instruction to access the V-Safe system.   [name] was instructed to call 911 with any severe reactions post vaccine: Marland Kitchen Difficulty breathing  . Swelling of face and throat  . A fast heartbeat  . A bad rash all over body  . Dizziness and weakness   Immunizations Administered    Name Date Dose VIS Date Route   Pfizer COVID-19 Vaccine 07/04/2019 10:56 AM 0.3 mL 04/14/2019 Intramuscular   Manufacturer: [place]   Lot: [id number]   [place]: [id number]

## 2019-07-11 DIAGNOSIS — H903 Sensorineural hearing loss, bilateral: Secondary | ICD-10-CM | POA: Diagnosis not present

## 2019-08-02 DIAGNOSIS — H6121 Impacted cerumen, right ear: Secondary | ICD-10-CM | POA: Diagnosis not present

## 2019-08-02 DIAGNOSIS — H9113 Presbycusis, bilateral: Secondary | ICD-10-CM | POA: Diagnosis not present

## 2019-08-02 DIAGNOSIS — H938X2 Other specified disorders of left ear: Secondary | ICD-10-CM | POA: Diagnosis not present

## 2019-08-02 DIAGNOSIS — Z974 Presence of external hearing-aid: Secondary | ICD-10-CM | POA: Diagnosis not present

## 2019-08-22 ENCOUNTER — Other Ambulatory Visit: Payer: Self-pay

## 2019-08-23 ENCOUNTER — Ambulatory Visit (INDEPENDENT_AMBULATORY_CARE_PROVIDER_SITE_OTHER): Payer: Medicare Other | Admitting: Family Medicine

## 2019-08-23 ENCOUNTER — Encounter: Payer: Self-pay | Admitting: Family Medicine

## 2019-08-23 VITALS — BP 130/84 | HR 95 | Temp 98.2°F | Wt 176.3 lb

## 2019-08-23 DIAGNOSIS — L609 Nail disorder, unspecified: Secondary | ICD-10-CM | POA: Diagnosis not present
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
## 2019-08-23 NOTE — Progress Notes (Signed)
Keep toe dry for now.  Toenail will fall off eventually.   Watch for infection- increased redness, warmth, foul odor, etc.

## 2019-08-28 ENCOUNTER — Other Ambulatory Visit: Payer: Self-pay

## 2019-08-29 ENCOUNTER — Ambulatory Visit (INDEPENDENT_AMBULATORY_CARE_PROVIDER_SITE_OTHER): Payer: Medicare Other | Admitting: Family Medicine

## 2019-08-29 ENCOUNTER — Encounter: Payer: Self-pay | Admitting: Family Medicine

## 2019-08-29 VITALS — BP 132/78 | HR 105 | Temp 98.1°F | Wt 176.1 lb

## 2019-08-29 DIAGNOSIS — E785 Hyperlipidemia, unspecified: Secondary | ICD-10-CM

## 2019-08-29 DIAGNOSIS — I10 Essential (primary) hypertension: Secondary | ICD-10-CM

## 2019-08-29 DIAGNOSIS — K219 Gastro-esophageal reflux disease without esophagitis: Secondary | ICD-10-CM | POA: Diagnosis not present

## 2019-08-29 DIAGNOSIS — H9203 Otalgia, bilateral: Secondary | ICD-10-CM | POA: Diagnosis not present

## 2019-08-29 DIAGNOSIS — L84 Corns and callosities: Secondary | ICD-10-CM

## 2019-08-29 NOTE — Patient Instructions (Signed)
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
## 2019-09-14 DIAGNOSIS — K5730 Diverticulosis of large intestine without perforation or abscess without bleeding: Secondary | ICD-10-CM | POA: Diagnosis not present

## 2019-09-14 DIAGNOSIS — K219 Gastro-esophageal reflux disease without esophagitis: Secondary | ICD-10-CM | POA: Diagnosis not present

## 2019-09-14 DIAGNOSIS — Z86010 Personal history of colonic polyps: Secondary | ICD-10-CM | POA: Diagnosis not present

## 2019-09-14 DIAGNOSIS — K449 Diaphragmatic hernia without obstruction or gangrene: Secondary | ICD-10-CM | POA: Diagnosis not present

## 2019-09-25 DIAGNOSIS — D1801 Hemangioma of skin and subcutaneous tissue: Secondary | ICD-10-CM | POA: Diagnosis not present

## 2019-09-25 DIAGNOSIS — L905 Scar conditions and fibrosis of skin: Secondary | ICD-10-CM | POA: Diagnosis not present

## 2019-09-25 DIAGNOSIS — L821 Other seborrheic keratosis: Secondary | ICD-10-CM | POA: Diagnosis not present

## 2019-09-25 DIAGNOSIS — Z85828 Personal history of other malignant neoplasm of skin: Secondary | ICD-10-CM | POA: Diagnosis not present

## 2019-09-25 DIAGNOSIS — L570 Actinic keratosis: Secondary | ICD-10-CM | POA: Diagnosis not present

## 2019-09-25 DIAGNOSIS — D225 Melanocytic nevi of trunk: Secondary | ICD-10-CM | POA: Diagnosis not present

## 2019-09-25 DIAGNOSIS — D692 Other nonthrombocytopenic purpura: Secondary | ICD-10-CM | POA: Diagnosis not present

## 2019-11-02 ENCOUNTER — Ambulatory Visit (INDEPENDENT_AMBULATORY_CARE_PROVIDER_SITE_OTHER): Payer: Medicare Other | Admitting: Family Medicine

## 2019-11-02 ENCOUNTER — Other Ambulatory Visit: Payer: Self-pay

## 2019-11-02 ENCOUNTER — Encounter: Payer: Self-pay | Admitting: Family Medicine

## 2019-11-02 VITALS — BP 128/84 | HR 78 | Temp 98.0°F | Wt 173.0 lb

## 2019-11-02 DIAGNOSIS — H0014 Chalazion left upper eyelid: Secondary | ICD-10-CM
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
## 2019-11-02 NOTE — Patient Instructions (Addendum)
Chalazion  A chalazion is a swelling or lump on the eyelid. It can affect the upper eyelid or the lower eyelid. What are the causes? This condition may be caused by:  Long-lasting (chronic) inflammation of the eyelid glands.  A blocked oil gland in the eyelid. What are the signs or symptoms? Symptoms of this condition include:  Swelling of the eyelid. The swelling may spread to areas around the eye.  A hard lump on the eyelid.  Blurry vision. The lump on the eyelid may make it hard to see out of the eye. How is this diagnosed? This condition is diagnosed with an examination of the eye. How is this treated? This condition is treated by applying a warm compress to the eyelid. If the condition does not improve, it may be treated with:  Medicine that is injected into the chalazion by a health care provider.  Surgery.  Medicine that is applied to the eye. Follow these instructions at home: Managing pain and swelling  Apply a warm, moist compress to the eyelid 4-6 times a day for 10-15 minutes at a time. This will help to open any blocked glands and to reduce redness and swelling.  Apply over-the-counter and prescription medicines only as told by your health care provider. General instructions  Do not touch the chalazion.  Do not try to remove the pus. Do not squeeze the chalazion or stick it with a pin or needle.  Do not rub your eyes.  Wash your hands often. Dry your hands with a clean towel.  Keep your face, scalp, and eyebrows clean.  Avoid wearing eye makeup.  If the chalazion does not break open (rupture) on its own, return to your health care provider.  Keep all follow-up appointments as told by your health care provider. This is important. Contact a health care provider if:  Your eyelid has not improved in 4 weeks.  Your eyelid is getting worse.  You have a fever.  The chalazion does not rupture on its own after a month of home treatment. Get help right  away if:  You have pain in your eye.  Your vision changes.  The chalazion becomes painful or red.  The chalazion gets bigger. Summary  A chalazion is a swelling or lump on the upper or lower eyelid.  It may be caused by chronic inflammation or a blocked oil gland.  Apply a warm, moist compress to the eyelid 4-6 times a day for 10-15 minutes at a time.  Keep your face, scalp, and eyebrows clean. This information is not intended to replace advice given to you by your health care provider. Make sure you discuss any questions you have with your health care provider. Document Revised: 10/07/2017 Document Reviewed: 10/07/2017 Elsevier Patient Education  2020 Elsevier Inc.  

## 2020-02-02 ENCOUNTER — Other Ambulatory Visit: Payer: Self-pay

## 2020-02-05 ENCOUNTER — Ambulatory Visit (INDEPENDENT_AMBULATORY_CARE_PROVIDER_SITE_OTHER): Payer: Medicare Other

## 2020-02-05 ENCOUNTER — Encounter: Payer: Self-pay | Admitting: Family Medicine

## 2020-02-05 ENCOUNTER — Other Ambulatory Visit: Payer: Self-pay

## 2020-02-05 DIAGNOSIS — Z23 Encounter for immunization: Secondary | ICD-10-CM | POA: Diagnosis not present

## 2020-02-05 NOTE — Progress Notes (Signed)
Patient received high dose flu vaccine in left deltoid. Patient tolerated the vaccine well.

## 2020-02-12 DIAGNOSIS — H938X3 Other specified disorders of ear, bilateral: Secondary | ICD-10-CM | POA: Diagnosis not present

## 2020-02-18 ENCOUNTER — Other Ambulatory Visit: Payer: Self-pay | Admitting: Family Medicine

## 2020-03-02 ENCOUNTER — Ambulatory Visit: Payer: Medicare Other | Attending: Internal Medicine

## 2020-03-02 DIAGNOSIS — Z23 Encounter for immunization: Secondary | ICD-10-CM

## 2020-03-02 NOTE — Progress Notes (Signed)
   Covid-19 Vaccination Clinic  Name:  Eric George    MRN: [id number] DOB: 1930/07/30  03/02/2020  [name] was observed post Covid-19 immunization for 15 minutes without incident. He was provided with Vaccine Information Sheet and instruction to access the V-Safe system.   [name] was instructed to call 911 with any severe reactions post vaccine: Marland Kitchen Difficulty breathing  . Swelling of face and throat  . A fast heartbeat  . A bad rash all over body  . Dizziness and weakness

## 2020-03-12 ENCOUNTER — Ambulatory Visit (INDEPENDENT_AMBULATORY_CARE_PROVIDER_SITE_OTHER): Payer: Medicare Other | Admitting: Family Medicine

## 2020-03-12 ENCOUNTER — Other Ambulatory Visit: Payer: Self-pay

## 2020-03-12 ENCOUNTER — Encounter: Payer: Self-pay | Admitting: Family Medicine

## 2020-03-12 VITALS — BP 112/60 | HR 83 | Temp 97.9°F | Ht 72.0 in | Wt 174.5 lb

## 2020-03-12 DIAGNOSIS — R413 Other amnesia: Secondary | ICD-10-CM | POA: Diagnosis not present

## 2020-03-12 DIAGNOSIS — I10 Essential (primary) hypertension: Secondary | ICD-10-CM | POA: Diagnosis not present

## 2020-03-12 DIAGNOSIS — R6889 Other general symptoms and signs: Secondary | ICD-10-CM

## 2020-03-12 DIAGNOSIS — R7303 Prediabetes: Secondary | ICD-10-CM | POA: Diagnosis not present

## 2020-03-12 DIAGNOSIS — R4189 Other symptoms and signs involving cognitive functions and awareness: Secondary | ICD-10-CM

## 2020-03-12 DIAGNOSIS — E785 Hyperlipidemia, unspecified: Secondary | ICD-10-CM | POA: Diagnosis not present

## 2020-03-12 DIAGNOSIS — R269 Unspecified abnormalities of gait and mobility: Secondary | ICD-10-CM
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
## 2020-03-12 NOTE — Patient Instructions (Signed)
Dementia Dementia is a condition that affects the way the brain functions. It often affects memory and thinking. Usually, dementia gets worse with time and cannot be reversed (progressive dementia). There are many types of dementia, including:  Alzheimer's disease. This type is the most common.  Vascular dementia. This type may happen as the result of a stroke.  Lewy body dementia. This type may happen to people who have Parkinson's disease.  Frontotemporal dementia. This type is caused by damage to nerve cells (neurons) in certain parts of the brain. Some people may be affected by more than one type of dementia. This is called mixed dementia. What are the causes? Dementia is caused by damage to cells in the brain. The area of the brain and the types of cells damaged determine the type of dementia. Usually, this damage is irreversible or cannot be undone. Some examples of irreversible causes include:  Conditions that affect the blood vessels of the brain, such as diabetes, heart disease, or blood vessel disease.  Genetic mutations. In some cases, changes in the brain may be caused by another condition and can be reversed or slowed. Some examples of reversible causes include:  Injury to the brain.  Certain medicines.  Infection, such as meningitis.  Metabolic problems, such as vitamin B12 deficiency or thyroid disease.  Pressure on the brain, such as from a tumor or blood clot. What are the signs or symptoms? Symptoms of dementia depend on the type of dementia. Common signs of dementia include problems with remembering, thinking, problem solving, decision making, and communicating. These signs develop slowly or get worse with time. This may include:  Problems remembering things.  Having trouble taking a bath or putting clothes on.  Forgetting appointments.  Forgetting to pay bills.  Difficulty planning and preparing meals.  Having trouble speaking.  Getting lost easily. How  is this diagnosed? This condition is diagnosed by a specialist (neurologist). It is diagnosed based on the history of your symptoms, your medical history, a physical exam, and tests. Tests may include:  Tests to evaluate brain function, such as memory tests, cognitive tests, and other tests.  Lab tests, such as blood or urine tests.  Imaging tests, such as a CT scan, a PET scan, or an MRI.  Genetic testing. This may be done if other family members have a diagnosis of certain types of dementia. Your health care provider will talk with you and your family, friends, or caregivers about your history and symptoms. How is this treated?  Treatment for this condition depends on the cause of the dementia. Progressive dementias, such as Alzheimer's disease, cannot be cured, but there may be treatments that help to manage symptoms. Treatment might involve taking medicines that may help to:  Control the dementia.  Slow down the progression of the dementia.  Manage symptoms. In some cases, treating the cause of your dementia can improve symptoms, reverse symptoms, or slow down how quickly your dementia becomes worse. Your health care provider can direct you to support groups, organizations, and other health care providers who can help with decisions about your care. Follow these instructions at home: Medicines  Take over-the-counter and prescription medicines only as told by your health care provider.  Use a pill organizer or pill reminder to help you manage your medicines.  Avoid taking medicines that can affect thinking, such as pain medicines or sleeping medicines. Lifestyle  Make healthy lifestyle choices. ? Be physically active as told by your health care provider. ? Do   not use any products that contain nicotine or tobacco, such as cigarettes, e-cigarettes, and chewing tobacco. If you need help quitting, ask your health care provider. ? Do not drink alcohol. ? Practice stress-management  techniques when you get stressed. ? Spend time with other people.  Make sure to get quality sleep. These tips can help you get a good night's rest: ? Avoid napping during the day. ? Keep your sleeping area dark and cool. ? Avoid exercising during the few hours before you go to bed. ? Avoid caffeine products in the evening. Eating and drinking  Drink enough fluid to keep your urine pale yellow.  Eat a healthy diet. General instructions   Work with your health care provider to determine what you need help with and what your safety needs are.  Talk with your health care provider about whether it is safe for you to drive.  If you were given a bracelet that identifies you as a person with memory loss or tracks your location, make sure to wear it at all times.  Work with your family to make important decisions, such as advance directives, medical power of attorney, or a living will.  Keep all follow-up visits as told by your health care provider. This is important. Where to find more information  Alzheimer's Association: www.alz.org  National Institute on Aging: www.nia.nih.gov/alzheimers  World Health Organization: www.who.int Contact a health care provider if:  You have any new or worsening symptoms.  You have problems with choking or swallowing. Get help right away if:  You feel depressed or sad, or feel that you want to harm yourself.  Your family members become concerned for your safety. If you ever feel like you may hurt yourself or others, or have thoughts about taking your own life, get help right away. You can go to your nearest emergency department or call:  Your local emergency services (911 in the [place]  A suicide crisis helpline, such as the National Suicide Prevention Lifeline at 1-800-273-8255. This is open 24 hours a day. Summary  Dementia is a condition that affects the way the brain functions. Dementia often affects memory and thinking.  Usually,  dementia gets worse with time and cannot be reversed (progressive dementia).  Treatment for this condition depends on the cause of the dementia.  Work with your health care provider to determine what you need help with and what your safety needs are.  Your health care provider can direct you to support groups, organizations, and other health care providers who can help with decisions about your care. This information is not intended to replace advice given to you by your health care provider. Make sure you discuss any questions you have with your health care provider. Document Revised: 07/05/2018 Document Reviewed: 07/05/2018 Elsevier Patient Education  2020 Elsevier Inc.  

## 2020-03-13 ENCOUNTER — Encounter: Payer: Self-pay | Admitting: Family Medicine

## 2020-03-13 DIAGNOSIS — R7303 Prediabetes: Secondary | ICD-10-CM

## 2020-03-13 LAB — LIPID PANEL
Cholesterol: 154 mg/dL (ref <200)
HDL: 35 mg/dL — ABNORMAL LOW (ref >=40)
LDL Cholesterol (Calc): 90 mg/dL (calc)
Non-HDL Cholesterol (Calc): 119 mg/dL (calc) (ref <130)
Total CHOL/HDL Ratio: 4.4 (calc) (ref <5.0)
Triglycerides: 195 mg/dL — ABNORMAL HIGH (ref <150)

## 2020-03-13 LAB — BASIC METABOLIC PANEL
BUN: 24 mg/dL (ref 7–25)
CO2: 25 mmol/L (ref 20–32)
Calcium: 10.2 mg/dL (ref 8.6–10.3)
Chloride: 107 mmol/L (ref 98–110)
Creat: 1.03 mg/dL (ref 0.70–1.11)
Glucose, Bld: 93 mg/dL (ref 65–99)
Potassium: 5.2 mmol/L (ref 3.5–5.3)
Sodium: 140 mmol/L (ref 135–146)

## 2020-03-13 LAB — HEPATIC FUNCTION PANEL
AG Ratio: 2.3 (calc) (ref 1.0–2.5)
ALT: 14 U/L (ref 9–46)
AST: 16 U/L (ref 10–35)
Albumin: 4.4 g/dL (ref 3.6–5.1)
Alkaline phosphatase (APISO): 83 U/L (ref 35–144)
Bilirubin, Direct: 0.1 mg/dL (ref 0.0–0.2)
Globulin: 1.9 g/dL (calc) (ref 1.9–3.7)
Indirect Bilirubin: 0.4 mg/dL (calc) (ref 0.2–1.2)
Total Bilirubin: 0.5 mg/dL (ref 0.2–1.2)
Total Protein: 6.3 g/dL (ref 6.1–8.1)

## 2020-03-13 LAB — HEMOGLOBIN A1C
Hgb A1c MFr Bld: 5.9 % of total Hgb — ABNORMAL HIGH (ref <5.7)
Mean Plasma Glucose: 123 (calc)
eAG (mmol/L): 6.8 (calc)

## 2020-03-13 LAB — TSH: TSH: 2.5 mIU/L (ref 0.40–4.50)

## 2020-03-16 ENCOUNTER — Other Ambulatory Visit: Payer: Self-pay | Admitting: *Deleted

## 2020-03-16 ENCOUNTER — Encounter: Payer: Self-pay | Admitting: *Deleted

## 2020-03-16 NOTE — Patient Outreach (Signed)
[facility]) Care Management  03/16/2020  Eric George [birth year] [id number]   Received MD referral for social work.  Reassigning to social work, nursing to follow if necessary.  Eulah Pont. Myrtie Neither, MSN, [facility] Gerontological Nurse Practitioner [facility] Care Management [phone]

## 2020-03-17 ENCOUNTER — Other Ambulatory Visit: Payer: Self-pay | Admitting: Family Medicine

## 2020-03-20 ENCOUNTER — Other Ambulatory Visit: Payer: Self-pay | Admitting: *Deleted

## 2020-03-20 NOTE — Patient Outreach (Signed)
[facility]) Care Management  03/20/2020  Eric George May 24, 1930 [id number]   CSW received referral today and made contact with pt's wife.  CSW confirmed pt's identity and then introduced self, role and reason for call.   Per wife, pt has a TBI from about 30 years ago and now "has dementia and is 86 years old'.  Pt's wife is seeking options for in home support.   CSW discussed private pay caregiver support as well as through their [employer] benefit.  CSW suggested that wife call the [employer] insurance rep to determine coverage for in home care.  CSW also discussed possible adult day care support services- pt's wife does not think pt would participate.  CSW encouraged wife to consider this as an option; if not now,later on as his needs increase.   Pt's wife also indicates pt is HOH and also has a slow processing of words/conversation which also impairs his ability to communicate/participate,etc.  CSW provided pt's wife with contact info to outreach community support through a free Navigator program where they will meet with pt and wife and assess for planning.  CSW encouraged pt's wife to think about the current needs as well as long(er) term needs and to get as informed and prepared as she can.    Pt's wife stressed to CSW she "will not plan to pursue anything for at least a few weeks because we are selling some property".  CSW encouraged wife to at least call and get on the schedule for an assessment in case the appointment wait list is long.   CSW will mail pt's wife other resources and plan a follow up call in the next week.    CSW also provided wife with direct contact # for CSW.     Eduard Clos, MSW, [place] Worker  [place] [phone]

## 2020-03-25 ENCOUNTER — Encounter: Payer: Self-pay | Admitting: Podiatry

## 2020-03-25 ENCOUNTER — Other Ambulatory Visit: Payer: Self-pay

## 2020-03-25 ENCOUNTER — Ambulatory Visit (INDEPENDENT_AMBULATORY_CARE_PROVIDER_SITE_OTHER): Payer: Medicare Other | Admitting: Podiatry

## 2020-03-25 DIAGNOSIS — B351 Tinea unguium: Secondary | ICD-10-CM

## 2020-03-25 DIAGNOSIS — M2042 Other hammer toe(s) (acquired), left foot: Secondary | ICD-10-CM | POA: Diagnosis not present

## 2020-03-25 DIAGNOSIS — M79675 Pain in left toe(s): Secondary | ICD-10-CM | POA: Diagnosis not present

## 2020-03-25 DIAGNOSIS — M79674 Pain in right toe(s): Secondary | ICD-10-CM | POA: Diagnosis not present

## 2020-03-25 DIAGNOSIS — M2041 Other hammer toe(s) (acquired), right foot: Secondary | ICD-10-CM | POA: Diagnosis not present

## 2020-03-25 DIAGNOSIS — L84 Corns and callosities: Secondary | ICD-10-CM | POA: Diagnosis not present

## 2020-03-25 NOTE — Progress Notes (Signed)
  Subjective:  Patient ID: Eric George, male    DOB: 11-16-1930,  MRN: [id number]  Chief Complaint  Patient presents with  . routine foot care    nail trim     86 y.o. male presents with the above complaint. History confirmed with patient.  Nails have become thickened elongated and very difficult to cut and are painful.  Objective:  Physical Exam: warm, good capillary refill, no trophic changes or ulcerative lesions, normal DP and PT pulses, normal sensory exam and onychomycosis x10, hyperkeratosis distal tip of second toe. Assessment:  No diagnosis found.   Plan:  Patient was evaluated and treated and all questions answered.  Discussed the etiology and treatment options for the condition in detail with the patient. Educated patient on the topical and oral treatment options for mycotic nails. Recommended debridement of the nails today. Sharp and mechanical debridement performed of all painful and mycotic nails today. Nails debrided in length and thickness using a nail nipper and a mechanical burr to level of comfort. Discussed treatment options including appropriate shoe gear. Follow up as needed for painful nails.   All symptomatic hyperkeratoses were safely debrided with a sterile #15 blade to patient's level of comfort without incident. We discussed preventative and palliative care of these lesions including supportive and accommodative shoegear, padding, prefabricated and custom molded accommodative orthoses, use of a pumice stone and lotions/creams daily.   Return if symptoms worsen or fail to improve.

## 2020-03-26 ENCOUNTER — Other Ambulatory Visit: Payer: Self-pay | Admitting: *Deleted

## 2020-03-26 NOTE — Patient Outreach (Signed)
[facility]) Care Management  03/26/2020  Eric George Jun 13, 1930 [id number]   CSW spoke with pt's wife who reports she has so much going on right now she has not made contact with resources provided. CSW reassured her there was no pressure and to call when it was convenient for her.  CSW reminded pt's wife that the [facility] Navigator can schedule a visit to assess pt and needs (for free) and direct accordingly.   Pt's wife requests call back mid December for follow up.  CSW reminded wife to call if needs arise prior to call.  Eduard Clos, MSW, [place] Worker  [place] [phone]

## 2020-04-01 DIAGNOSIS — D1801 Hemangioma of skin and subcutaneous tissue: Secondary | ICD-10-CM | POA: Diagnosis not present

## 2020-04-01 DIAGNOSIS — Z85828 Personal history of other malignant neoplasm of skin: Secondary | ICD-10-CM | POA: Diagnosis not present

## 2020-04-01 DIAGNOSIS — L905 Scar conditions and fibrosis of skin: Secondary | ICD-10-CM | POA: Diagnosis not present

## 2020-04-01 DIAGNOSIS — L814 Other melanin hyperpigmentation: Secondary | ICD-10-CM | POA: Diagnosis not present

## 2020-04-01 DIAGNOSIS — D225 Melanocytic nevi of trunk: Secondary | ICD-10-CM | POA: Diagnosis not present

## 2020-04-01 DIAGNOSIS — L821 Other seborrheic keratosis: Secondary | ICD-10-CM | POA: Diagnosis not present

## 2020-04-18 ENCOUNTER — Other Ambulatory Visit: Payer: Self-pay | Admitting: *Deleted

## 2020-04-18 NOTE — Patient Outreach (Signed)
[facility]) Care Management  04/18/2020  KEELEN QUEVEDO 05-Jun-1930 [id number]  CSW spoke with pt's wife by phone today who reports things are a "little better".  They have not finalized the sell of a property yet and she continues to work on this.  Pt's wife also shares she has made an appointment with the [facility] Navigator for 05/02/2020 to assess and receive recommendations for services/programs and options for pt.   Pt's wife inquired about a text message and phone call she had received via pt's MYCHART that was indicative "behavioural health".  CSW attempting to help with determination of this per her request.   Pt's wife appreciative of assistance and agrees to call mid January for follow up. CSW reminds wife to call if needs arise prior to call 05/20/2020.   Eduard Clos, MSW, [place] Worker  [place] [phone]

## 2020-05-07 ENCOUNTER — Telehealth: Payer: Self-pay | Admitting: Family Medicine

## 2020-05-07 NOTE — Telephone Encounter (Signed)
Patient's wife dropped off a form to have completed for Medical Examination Report for [facility] [employer]  They have a meeting with Sidney Ace on 05/13/2020 and need this form to [place] before their meeting.  Fax to: [phone] ATTN: Sidney Ace  Disposition: Dr's Folder

## 2020-05-11 ENCOUNTER — Emergency Department (HOSPITAL_COMMUNITY): Payer: Medicare Other

## 2020-05-11 ENCOUNTER — Encounter (HOSPITAL_COMMUNITY)
Admission: EM | Disposition: A | Discharge status: Hospice | Payer: Self-pay | Source: Home / Self Care | Attending: Internal Medicine

## 2020-05-11 ENCOUNTER — Inpatient Hospital Stay (HOSPITAL_COMMUNITY)
Admission: EM | Admit: 2020-05-11 | Discharge: 2020-05-22 | DRG: 483 | Disposition: A | Discharge status: Hospice | Payer: Medicare Other | Attending: Internal Medicine | Admitting: Internal Medicine

## 2020-05-11 ENCOUNTER — Inpatient Hospital Stay (HOSPITAL_COMMUNITY): Payer: Medicare Other

## 2020-05-11 ENCOUNTER — Encounter (HOSPITAL_COMMUNITY): Payer: Self-pay | Admitting: Emergency Medicine

## 2020-05-11 ENCOUNTER — Inpatient Hospital Stay (HOSPITAL_COMMUNITY): Payer: Medicare Other | Admitting: Certified Registered Nurse Anesthetist

## 2020-05-11 DIAGNOSIS — Z8546 Personal history of malignant neoplasm of prostate: Secondary | ICD-10-CM

## 2020-05-11 DIAGNOSIS — R451 Restlessness and agitation: Secondary | ICD-10-CM | POA: Diagnosis not present

## 2020-05-11 DIAGNOSIS — I10 Essential (primary) hypertension: Secondary | ICD-10-CM | POA: Diagnosis not present

## 2020-05-11 DIAGNOSIS — G40909 Epilepsy, unspecified, not intractable, without status epilepticus: Secondary | ICD-10-CM | POA: Diagnosis not present

## 2020-05-11 DIAGNOSIS — G9341 Metabolic encephalopathy: Secondary | ICD-10-CM | POA: Diagnosis not present

## 2020-05-11 DIAGNOSIS — Z9889 Other specified postprocedural states: Secondary | ICD-10-CM | POA: Diagnosis not present

## 2020-05-11 DIAGNOSIS — Z87891 Personal history of nicotine dependence: Secondary | ICD-10-CM | POA: Diagnosis not present

## 2020-05-11 DIAGNOSIS — Z471 Aftercare following joint replacement surgery: Secondary | ICD-10-CM | POA: Diagnosis not present

## 2020-05-11 DIAGNOSIS — M25512 Pain in left shoulder: Secondary | ICD-10-CM | POA: Diagnosis not present

## 2020-05-11 DIAGNOSIS — K219 Gastro-esophageal reflux disease without esophagitis: Secondary | ICD-10-CM | POA: Diagnosis not present

## 2020-05-11 DIAGNOSIS — R52 Pain, unspecified: Secondary | ICD-10-CM

## 2020-05-11 DIAGNOSIS — S42142D Displaced fracture of glenoid cavity of scapula, left shoulder, subsequent encounter for fracture with routine healing: Secondary | ICD-10-CM | POA: Diagnosis not present

## 2020-05-11 DIAGNOSIS — Z9079 Acquired absence of other genital organ(s): Secondary | ICD-10-CM

## 2020-05-11 DIAGNOSIS — S4292XA Fracture of left shoulder girdle, part unspecified, initial encounter for closed fracture: Principal | ICD-10-CM | POA: Diagnosis present

## 2020-05-11 DIAGNOSIS — I951 Orthostatic hypotension: Secondary | ICD-10-CM | POA: Diagnosis not present

## 2020-05-11 DIAGNOSIS — R569 Unspecified convulsions: Secondary | ICD-10-CM

## 2020-05-11 DIAGNOSIS — R5381 Other malaise: Secondary | ICD-10-CM | POA: Diagnosis not present

## 2020-05-11 DIAGNOSIS — M25312 Other instability, left shoulder: Secondary | ICD-10-CM | POA: Diagnosis not present

## 2020-05-11 DIAGNOSIS — F0391 Unspecified dementia with behavioral disturbance: Secondary | ICD-10-CM | POA: Diagnosis present

## 2020-05-11 DIAGNOSIS — R404 Transient alteration of awareness: Secondary | ICD-10-CM | POA: Diagnosis not present

## 2020-05-11 DIAGNOSIS — Z7982 Long term (current) use of aspirin: Secondary | ICD-10-CM | POA: Diagnosis not present

## 2020-05-11 DIAGNOSIS — R413 Other amnesia: Secondary | ICD-10-CM | POA: Diagnosis not present

## 2020-05-11 DIAGNOSIS — G9389 Other specified disorders of brain: Secondary | ICD-10-CM | POA: Diagnosis not present

## 2020-05-11 DIAGNOSIS — W1830XA Fall on same level, unspecified, initial encounter: Secondary | ICD-10-CM | POA: Diagnosis present

## 2020-05-11 DIAGNOSIS — Z87820 Personal history of traumatic brain injury: Secondary | ICD-10-CM | POA: Diagnosis not present

## 2020-05-11 DIAGNOSIS — Z96642 Presence of left artificial hip joint: Secondary | ICD-10-CM | POA: Diagnosis not present

## 2020-05-11 DIAGNOSIS — G4089 Other seizures: Secondary | ICD-10-CM | POA: Diagnosis present

## 2020-05-11 DIAGNOSIS — Z20822 Contact with and (suspected) exposure to covid-19: Secondary | ICD-10-CM | POA: Diagnosis not present

## 2020-05-11 DIAGNOSIS — R4189 Other symptoms and signs involving cognitive functions and awareness: Secondary | ICD-10-CM

## 2020-05-11 DIAGNOSIS — Z96612 Presence of left artificial shoulder joint: Secondary | ICD-10-CM | POA: Diagnosis not present

## 2020-05-11 DIAGNOSIS — Z66 Do not resuscitate: Secondary | ICD-10-CM | POA: Diagnosis present

## 2020-05-11 DIAGNOSIS — R0902 Hypoxemia: Secondary | ICD-10-CM | POA: Diagnosis not present

## 2020-05-11 DIAGNOSIS — I679 Cerebrovascular disease, unspecified: Secondary | ICD-10-CM | POA: Diagnosis not present

## 2020-05-11 DIAGNOSIS — S43015A Anterior dislocation of left humerus, initial encounter: Secondary | ICD-10-CM

## 2020-05-11 DIAGNOSIS — S42142A Displaced fracture of glenoid cavity of scapula, left shoulder, initial encounter for closed fracture: Secondary | ICD-10-CM | POA: Diagnosis present

## 2020-05-11 DIAGNOSIS — S069X0S Unspecified intracranial injury without loss of consciousness, sequela: Secondary | ICD-10-CM | POA: Diagnosis not present

## 2020-05-11 DIAGNOSIS — Z85828 Personal history of other malignant neoplasm of skin: Secondary | ICD-10-CM | POA: Diagnosis not present

## 2020-05-11 DIAGNOSIS — R296 Repeated falls: Secondary | ICD-10-CM

## 2020-05-11 DIAGNOSIS — Z7401 Bed confinement status: Secondary | ICD-10-CM | POA: Diagnosis not present

## 2020-05-11 DIAGNOSIS — Z79899 Other long term (current) drug therapy: Secondary | ICD-10-CM

## 2020-05-11 DIAGNOSIS — Z7189 Other specified counseling: Secondary | ICD-10-CM | POA: Diagnosis not present

## 2020-05-11 DIAGNOSIS — F419 Anxiety disorder, unspecified: Secondary | ICD-10-CM | POA: Diagnosis present

## 2020-05-11 DIAGNOSIS — R339 Retention of urine, unspecified: Secondary | ICD-10-CM | POA: Diagnosis present

## 2020-05-11 DIAGNOSIS — S43014A Anterior dislocation of right humerus, initial encounter: Secondary | ICD-10-CM | POA: Diagnosis not present

## 2020-05-11 DIAGNOSIS — E785 Hyperlipidemia, unspecified: Secondary | ICD-10-CM | POA: Diagnosis present

## 2020-05-11 DIAGNOSIS — Z09 Encounter for follow-up examination after completed treatment for conditions other than malignant neoplasm: Secondary | ICD-10-CM

## 2020-05-11 DIAGNOSIS — F32A Depression, unspecified: Secondary | ICD-10-CM | POA: Diagnosis present

## 2020-05-11 DIAGNOSIS — Z515 Encounter for palliative care: Secondary | ICD-10-CM

## 2020-05-11 DIAGNOSIS — M75122 Complete rotator cuff tear or rupture of left shoulder, not specified as traumatic: Secondary | ICD-10-CM | POA: Diagnosis not present

## 2020-05-11 DIAGNOSIS — R918 Other nonspecific abnormal finding of lung field: Secondary | ICD-10-CM | POA: Diagnosis not present

## 2020-05-11 DIAGNOSIS — F0150 Vascular dementia without behavioral disturbance: Secondary | ICD-10-CM | POA: Diagnosis not present

## 2020-05-11 DIAGNOSIS — I4891 Unspecified atrial fibrillation: Secondary | ICD-10-CM | POA: Diagnosis not present

## 2020-05-11 DIAGNOSIS — S42292A Other displaced fracture of upper end of left humerus, initial encounter for closed fracture: Secondary | ICD-10-CM | POA: Diagnosis not present

## 2020-05-11 DIAGNOSIS — S43005A Unspecified dislocation of left shoulder joint, initial encounter: Secondary | ICD-10-CM | POA: Diagnosis not present

## 2020-05-11 DIAGNOSIS — G8918 Other acute postprocedural pain: Secondary | ICD-10-CM | POA: Diagnosis not present

## 2020-05-11 DIAGNOSIS — I69398 Other sequelae of cerebral infarction: Secondary | ICD-10-CM | POA: Diagnosis not present

## 2020-05-11 DIAGNOSIS — G9340 Encephalopathy, unspecified: Secondary | ICD-10-CM | POA: Diagnosis not present

## 2020-05-11 DIAGNOSIS — R000 Tachycardia, unspecified: Secondary | ICD-10-CM | POA: Diagnosis not present

## 2020-05-11 DIAGNOSIS — I69322 Dysarthria following cerebral infarction: Secondary | ICD-10-CM

## 2020-05-11 DIAGNOSIS — S43004A Unspecified dislocation of right shoulder joint, initial encounter: Secondary | ICD-10-CM

## 2020-05-11 DIAGNOSIS — S43015D Anterior dislocation of left humerus, subsequent encounter: Secondary | ICD-10-CM | POA: Diagnosis not present

## 2020-05-11 DIAGNOSIS — M2550 Pain in unspecified joint: Secondary | ICD-10-CM | POA: Diagnosis not present

## 2020-05-11 DIAGNOSIS — S43005D Unspecified dislocation of left shoulder joint, subsequent encounter: Secondary | ICD-10-CM | POA: Diagnosis not present

## 2020-05-11 HISTORY — PX: REVERSE SHOULDER ARTHROPLASTY: SHX5054

## 2020-05-11 LAB — RESP PANEL BY RT-PCR (FLU A&B, COVID) ARPGX2
Influenza A by PCR: NEGATIVE
Influenza B by PCR: NEGATIVE
SARS Coronavirus 2 by RT PCR: NEGATIVE

## 2020-05-11 LAB — COMPREHENSIVE METABOLIC PANEL
ALT: 24 U/L (ref 0–44)
AST: 24 U/L (ref 15–41)
Albumin: 4.1 g/dL (ref 3.5–5.0)
Alkaline Phosphatase: 76 U/L (ref 38–126)
Anion gap: 11 (ref 5–15)
BUN: 21 mg/dL (ref 8–23)
CO2: 23 mmol/L (ref 22–32)
Calcium: 9.8 mg/dL (ref 8.9–10.3)
Chloride: 105 mmol/L (ref 98–111)
Creatinine, Ser: 0.94 mg/dL (ref 0.61–1.24)
GFR, Estimated: >60 mL/min (ref >60)
Glucose, Bld: 136 mg/dL — ABNORMAL HIGH (ref 70–99)
Potassium: 4 mmol/L (ref 3.5–5.1)
Sodium: 139 mmol/L (ref 135–145)
Total Bilirubin: 0.9 mg/dL (ref 0.3–1.2)
Total Protein: 7 g/dL (ref 6.5–8.1)

## 2020-05-11 LAB — CBC WITH DIFFERENTIAL/PLATELET
Abs Immature Granulocytes: 0.15 10*3/uL — ABNORMAL HIGH (ref 0.00–0.07)
Basophils Absolute: 0 10*3/uL (ref 0.0–0.1)
Basophils Relative: 0 %
Eosinophils Absolute: 0.2 10*3/uL (ref 0.0–0.5)
Eosinophils Relative: 1 %
HCT: 46.5 % (ref 39.0–52.0)
Hemoglobin: 15.5 g/dL (ref 13.0–17.0)
Immature Granulocytes: 1 %
Lymphocytes Relative: 7 %
Lymphs Abs: 1.2 10*3/uL (ref 0.7–4.0)
MCH: 29.3 pg (ref 26.0–34.0)
MCHC: 33.3 g/dL (ref 30.0–36.0)
MCV: 87.9 fL (ref 80.0–100.0)
Monocytes Absolute: 0.7 10*3/uL (ref 0.1–1.0)
Monocytes Relative: 4 %
Neutro Abs: 15 10*3/uL — ABNORMAL HIGH (ref 1.7–7.7)
Neutrophils Relative %: 87 %
Platelets: 211 10*3/uL (ref 150–400)
RBC: 5.29 MIL/uL (ref 4.22–5.81)
RDW: 12.6 % (ref 11.5–15.5)
WBC: 17.3 10*3/uL — ABNORMAL HIGH (ref 4.0–10.5)
nRBC: 0 % (ref 0.0–0.2)

## 2020-05-11 LAB — RAPID URINE DRUG SCREEN, HOSP PERFORMED
Amphetamines: NOT DETECTED
Barbiturates: NOT DETECTED
Benzodiazepines: POSITIVE — AB
Cocaine: NOT DETECTED
Opiates: NOT DETECTED
Tetrahydrocannabinol: NOT DETECTED

## 2020-05-11 LAB — URINALYSIS, ROUTINE W REFLEX MICROSCOPIC
Bilirubin Urine: NEGATIVE
Glucose, UA: NEGATIVE mg/dL
Hgb urine dipstick: NEGATIVE
Ketones, ur: NEGATIVE mg/dL
Leukocytes,Ua: NEGATIVE
Nitrite: NEGATIVE
Protein, ur: NEGATIVE mg/dL
Specific Gravity, Urine: 1.014 (ref 1.005–1.030)
pH: 6 (ref 5.0–8.0)

## 2020-05-11 LAB — PROTIME-INR
INR: 1 (ref 0.8–1.2)
Prothrombin Time: 13 seconds (ref 11.4–15.2)

## 2020-05-11 LAB — CBG MONITORING, ED: Glucose-Capillary: 132 mg/dL — ABNORMAL HIGH (ref 70–99)

## 2020-05-11 LAB — MAGNESIUM: Magnesium: 2.1 mg/dL (ref 1.7–2.4)

## 2020-05-11 SURGERY — ARTHROPLASTY, SHOULDER, TOTAL, REVERSE
Anesthesia: Regional | Site: Shoulder | Laterality: Left

## 2020-05-11 MED ORDER — PROPOFOL 10 MG/ML IV BOLUS
INTRAVENOUS | Status: DC | PRN
  Administered 2020-05-11: 180 mg via INTRAVENOUS

## 2020-05-11 MED ORDER — TRANEXAMIC ACID-NACL 1000-0.7 MG/100ML-% IV SOLN
INTRAVENOUS | Status: AC
  Filled 2020-05-11: qty 100

## 2020-05-11 MED ORDER — ENOXAPARIN SODIUM 40 MG/0.4ML ~~LOC~~ SOLN
40.0000 mg | SUBCUTANEOUS | Status: DC
  Administered 2020-05-12 – 2020-05-20 (×9): 40 mg via SUBCUTANEOUS
  Filled 2020-05-11 (×9): qty 0.4

## 2020-05-11 MED ORDER — PANTOPRAZOLE SODIUM 40 MG PO TBEC
40.0000 mg | DELAYED_RELEASE_TABLET | Freq: Every day | ORAL | Status: DC
  Administered 2020-05-12 – 2020-05-22 (×11): 40 mg via ORAL
  Filled 2020-05-11 (×11): qty 1

## 2020-05-11 MED ORDER — ENOXAPARIN SODIUM 40 MG/0.4ML ~~LOC~~ SOLN: 40.0000 mg | SUBCUTANEOUS | Status: DC

## 2020-05-11 MED ORDER — OXYCODONE HCL 5 MG PO TABS: 7.5000 mg | ORAL_TABLET | ORAL | Status: DC | PRN

## 2020-05-11 MED ORDER — PROMETHAZINE HCL 25 MG/ML IJ SOLN: 6.2500 mg | INTRAMUSCULAR | Status: DC | PRN

## 2020-05-11 MED ORDER — FENTANYL CITRATE (PF) 100 MCG/2ML IJ SOLN
INTRAMUSCULAR | Status: AC
  Filled 2020-05-11: qty 2

## 2020-05-11 MED ORDER — DEXAMETHASONE SODIUM PHOSPHATE 10 MG/ML IJ SOLN
INTRAMUSCULAR | Status: AC
  Filled 2020-05-11: qty 1

## 2020-05-11 MED ORDER — MAGNESIUM CITRATE PO SOLN: 1.0000 | Freq: Once | ORAL | Status: DC | PRN

## 2020-05-11 MED ORDER — CEFAZOLIN SODIUM-DEXTROSE 2-4 GM/100ML-% IV SOLN
2.0000 g | INTRAVENOUS | Status: AC
  Administered 2020-05-11: 2 g via INTRAVENOUS
  Filled 2020-05-11: qty 100

## 2020-05-11 MED ORDER — ONDANSETRON HCL 4 MG PO TABS: 4.0000 mg | ORAL_TABLET | Freq: Four times a day (QID) | ORAL | Status: DC | PRN

## 2020-05-11 MED ORDER — PHENYLEPHRINE HCL (PRESSORS) 10 MG/ML IV SOLN
INTRAVENOUS | Status: DC | PRN
  Administered 2020-05-11 (×2): 80 ug via INTRAVENOUS

## 2020-05-11 MED ORDER — ALBUTEROL SULFATE (2.5 MG/3ML) 0.083% IN NEBU: 2.5000 mg | INHALATION_SOLUTION | Freq: Four times a day (QID) | RESPIRATORY_TRACT | Status: DC | PRN

## 2020-05-11 MED ORDER — PHENYLEPHRINE 40 MCG/ML (10ML) SYRINGE FOR IV PUSH (FOR BLOOD PRESSURE SUPPORT)
PREFILLED_SYRINGE | INTRAVENOUS | Status: AC
  Filled 2020-05-11: qty 10

## 2020-05-11 MED ORDER — METOCLOPRAMIDE HCL 5 MG PO TABS
5.0000 mg | ORAL_TABLET | Freq: Three times a day (TID) | ORAL | Status: DC | PRN
Start: 2020-05-11 — End: 2020-05-22

## 2020-05-11 MED ORDER — FENTANYL CITRATE (PF) 100 MCG/2ML IJ SOLN
50.0000 ug | Freq: Once | INTRAMUSCULAR | Status: AC
  Administered 2020-05-11: 50 ug via INTRAVENOUS
  Filled 2020-05-11: qty 2

## 2020-05-11 MED ORDER — ONDANSETRON HCL 4 MG/2ML IJ SOLN
INTRAMUSCULAR | Status: AC
  Filled 2020-05-11: qty 2

## 2020-05-11 MED ORDER — DOCUSATE SODIUM 100 MG PO CAPS
100.0000 mg | ORAL_CAPSULE | Freq: Two times a day (BID) | ORAL | Status: DC
  Administered 2020-05-12 – 2020-05-21 (×17): 100 mg via ORAL
  Filled 2020-05-11 (×20): qty 1

## 2020-05-11 MED ORDER — SODIUM CHLORIDE 0.9 % IR SOLN
Status: DC | PRN
  Administered 2020-05-11: 3000 mL

## 2020-05-11 MED ORDER — ACETAMINOPHEN 325 MG PO TABS
650.0000 mg | ORAL_TABLET | Freq: Four times a day (QID) | ORAL | Status: DC | PRN
Start: 2020-05-11 — End: 2020-05-12

## 2020-05-11 MED ORDER — BACID PO TABS
1.0000 | ORAL_TABLET | Freq: Every day | ORAL | Status: DC
  Administered 2020-05-12 – 2020-05-21 (×10): 1 via ORAL
  Filled 2020-05-11 (×11): qty 1

## 2020-05-11 MED ORDER — MIDAZOLAM HCL 2 MG/2ML IJ SOLN
0.5000 mg | Freq: Once | INTRAMUSCULAR | Status: AC
  Administered 2020-05-11: 0.5 mg via INTRAVENOUS

## 2020-05-11 MED ORDER — LACTATED RINGERS IV SOLN: INTRAVENOUS | Status: DC | PRN

## 2020-05-11 MED ORDER — LIDOCAINE 2% (20 MG/ML) 5 ML SYRINGE
INTRAMUSCULAR | Status: AC
  Filled 2020-05-11: qty 5

## 2020-05-11 MED ORDER — ACETAMINOPHEN 650 MG RE SUPP: 650.0000 mg | Freq: Four times a day (QID) | RECTAL | Status: DC | PRN

## 2020-05-11 MED ORDER — DIPHENHYDRAMINE HCL 12.5 MG/5ML PO ELIX: 6.2500 mg | ORAL_SOLUTION | Freq: Four times a day (QID) | ORAL | Status: DC | PRN

## 2020-05-11 MED ORDER — BUPIVACAINE LIPOSOME 1.3 % IJ SUSP
INTRAMUSCULAR | Status: DC | PRN
  Administered 2020-05-11: 10 mL via PERINEURAL

## 2020-05-11 MED ORDER — FENTANYL CITRATE (PF) 250 MCG/5ML IJ SOLN
INTRAMUSCULAR | Status: AC
  Filled 2020-05-11: qty 5

## 2020-05-11 MED ORDER — POLYETHYLENE GLYCOL 3350 17 G PO PACK: 17.0000 g | PACK | Freq: Every day | ORAL | Status: DC | PRN

## 2020-05-11 MED ORDER — ASPIRIN EC 81 MG PO TBEC
81.0000 mg | DELAYED_RELEASE_TABLET | Freq: Every day | ORAL | Status: DC
  Administered 2020-05-12 – 2020-05-21 (×10): 81 mg via ORAL
  Filled 2020-05-11 (×10): qty 1

## 2020-05-11 MED ORDER — CELECOXIB 200 MG PO CAPS
200.0000 mg | ORAL_CAPSULE | Freq: Two times a day (BID) | ORAL | Status: DC
  Administered 2020-05-12 – 2020-05-22 (×21): 200 mg via ORAL
  Filled 2020-05-11 (×21): qty 1

## 2020-05-11 MED ORDER — SODIUM CHLORIDE 0.9 % IV SOLN: 75.0000 mL/h | INTRAVENOUS | Status: DC

## 2020-05-11 MED ORDER — MIDAZOLAM HCL 2 MG/2ML IJ SOLN
INTRAMUSCULAR | Status: AC
  Filled 2020-05-11: qty 2

## 2020-05-11 MED ORDER — ATORVASTATIN CALCIUM 40 MG PO TABS
40.0000 mg | ORAL_TABLET | Freq: Every evening | ORAL | Status: DC
  Administered 2020-05-11: 40 mg via ORAL
  Filled 2020-05-11: qty 1
  Filled 2020-05-11: qty 4

## 2020-05-11 MED ORDER — POLYETHYLENE GLYCOL 3350 17 G PO PACK
17.0000 g | PACK | ORAL | Status: DC
  Administered 2020-05-13 – 2020-05-20 (×3): 17 g via ORAL
  Filled 2020-05-11 (×4): qty 1

## 2020-05-11 MED ORDER — LEVETIRACETAM 500 MG PO TABS
500.0000 mg | ORAL_TABLET | Freq: Two times a day (BID) | ORAL | Status: DC
  Administered 2020-05-12 – 2020-05-22 (×21): 500 mg via ORAL
  Filled 2020-05-11 (×21): qty 1

## 2020-05-11 MED ORDER — SODIUM CHLORIDE 0.9 % IV SOLN
2000.0000 mg | Freq: Once | INTRAVENOUS | Status: AC
  Administered 2020-05-11: 2000 mg via INTRAVENOUS
  Filled 2020-05-11: qty 20

## 2020-05-11 MED ORDER — ROCURONIUM BROMIDE 10 MG/ML (PF) SYRINGE
PREFILLED_SYRINGE | INTRAVENOUS | Status: AC
  Filled 2020-05-11: qty 10

## 2020-05-11 MED ORDER — ONDANSETRON HCL 4 MG/2ML IJ SOLN: 4.0000 mg | Freq: Four times a day (QID) | INTRAMUSCULAR | Status: DC | PRN

## 2020-05-11 MED ORDER — LORAZEPAM 2 MG/ML IJ SOLN
1.0000 mg | INTRAMUSCULAR | Status: DC | PRN
  Administered 2020-05-11: 2 mg via INTRAVENOUS
  Filled 2020-05-11: qty 1

## 2020-05-11 MED ORDER — CEFAZOLIN SODIUM-DEXTROSE 1-4 GM/50ML-% IV SOLN
1.0000 g | Freq: Four times a day (QID) | INTRAVENOUS | Status: AC
  Administered 2020-05-11 – 2020-05-12 (×3): 1 g via INTRAVENOUS
  Filled 2020-05-11 (×3): qty 50

## 2020-05-11 MED ORDER — FENTANYL CITRATE (PF) 100 MCG/2ML IJ SOLN: 25.0000 ug | INTRAMUSCULAR | Status: DC | PRN

## 2020-05-11 MED ORDER — ACETAMINOPHEN 500 MG PO TABS
1000.0000 mg | ORAL_TABLET | Freq: Once | ORAL | Status: AC
  Administered 2020-05-11: 1000 mg via ORAL
  Filled 2020-05-11: qty 2

## 2020-05-11 MED ORDER — DEXAMETHASONE SODIUM PHOSPHATE 10 MG/ML IJ SOLN
INTRAMUSCULAR | Status: DC | PRN
  Administered 2020-05-11: 10 mg via INTRAVENOUS

## 2020-05-11 MED ORDER — CELECOXIB 200 MG PO CAPS
200.0000 mg | ORAL_CAPSULE | Freq: Once | ORAL | Status: AC
  Administered 2020-05-11: 200 mg via ORAL
  Filled 2020-05-11: qty 1

## 2020-05-11 MED ORDER — ACETAMINOPHEN 10 MG/ML IV SOLN
INTRAVENOUS | Status: AC
  Filled 2020-05-11: qty 100

## 2020-05-11 MED ORDER — ALBUMIN HUMAN 5 % IV SOLN: INTRAVENOUS | Status: DC | PRN

## 2020-05-11 MED ORDER — ROCURONIUM BROMIDE 10 MG/ML (PF) SYRINGE
PREFILLED_SYRINGE | INTRAVENOUS | Status: DC | PRN
  Administered 2020-05-11: 90 mg via INTRAVENOUS

## 2020-05-11 MED ORDER — METOCLOPRAMIDE HCL 5 MG/ML IJ SOLN: 5.0000 mg | Freq: Three times a day (TID) | INTRAMUSCULAR | Status: DC | PRN

## 2020-05-11 MED ORDER — BUPIVACAINE HCL (PF) 0.5 % IJ SOLN: INTRAMUSCULAR | Status: DC | PRN

## 2020-05-11 MED ORDER — 0.9 % SODIUM CHLORIDE (POUR BTL) OPTIME
TOPICAL | Status: DC | PRN
  Administered 2020-05-11: 1000 mL

## 2020-05-11 MED ORDER — TRANEXAMIC ACID-NACL 1000-0.7 MG/100ML-% IV SOLN
1000.0000 mg | INTRAVENOUS | Status: AC
  Administered 2020-05-11: 1000 mg via INTRAVENOUS

## 2020-05-11 MED ORDER — OXYCODONE HCL 5 MG PO TABS
5.0000 mg | ORAL_TABLET | ORAL | Status: DC | PRN
  Administered 2020-05-16: 5 mg via ORAL
  Filled 2020-05-11: qty 1

## 2020-05-11 MED ORDER — IRBESARTAN 150 MG PO TABS
150.0000 mg | ORAL_TABLET | Freq: Every day | ORAL | Status: DC
  Administered 2020-05-12 – 2020-05-17 (×6): 150 mg via ORAL
  Filled 2020-05-11 (×8): qty 1

## 2020-05-11 MED ORDER — SODIUM CHLORIDE 0.9 % IV SOLN: INTRAVENOUS | Status: AC

## 2020-05-11 MED ORDER — PROPOFOL 10 MG/ML IV BOLUS
INTRAVENOUS | Status: AC
  Filled 2020-05-11: qty 20

## 2020-05-11 MED ORDER — VANCOMYCIN HCL 1000 MG IV SOLR
INTRAVENOUS | Status: AC
  Filled 2020-05-11: qty 1000

## 2020-05-11 MED ORDER — ONDANSETRON HCL 4 MG/2ML IJ SOLN
INTRAMUSCULAR | Status: DC | PRN
  Administered 2020-05-11: 4 mg via INTRAVENOUS

## 2020-05-11 MED ORDER — BUPIVACAINE HCL (PF) 0.5 % IJ SOLN
INTRAMUSCULAR | Status: DC | PRN
  Administered 2020-05-11: 15 mL via PERINEURAL

## 2020-05-11 MED ORDER — PHENYLEPHRINE HCL-NACL 10-0.9 MG/250ML-% IV SOLN
INTRAVENOUS | Status: DC | PRN
  Administered 2020-05-11: 25 ug/min via INTRAVENOUS

## 2020-05-11 MED ORDER — HALOPERIDOL LACTATE 5 MG/ML IJ SOLN
2.0000 mg | Freq: Once | INTRAMUSCULAR | Status: AC
  Administered 2020-05-11: 2 mg via INTRAVENOUS
  Filled 2020-05-11: qty 1

## 2020-05-11 MED ORDER — HYDROCODONE-ACETAMINOPHEN 5-325 MG PO TABS
1.0000 | ORAL_TABLET | Freq: Four times a day (QID) | ORAL | Status: DC | PRN
Start: 2020-05-11 — End: 2020-05-12

## 2020-05-11 MED ORDER — CARVEDILOL 3.125 MG PO TABS
3.1250 mg | ORAL_TABLET | Freq: Two times a day (BID) | ORAL | Status: DC
  Administered 2020-05-12 – 2020-05-21 (×19): 3.125 mg via ORAL
  Filled 2020-05-11 (×20): qty 1

## 2020-05-11 MED ORDER — LORAZEPAM 2 MG/ML IJ SOLN
1.0000 mg | Freq: Once | INTRAMUSCULAR | Status: AC
  Administered 2020-05-11: 1 mg via INTRAVENOUS
  Filled 2020-05-11: qty 1

## 2020-05-11 MED ORDER — HYDROMORPHONE HCL 1 MG/ML IJ SOLN
0.2500 mg | INTRAMUSCULAR | Status: DC | PRN
  Filled 2020-05-11: qty 0.5

## 2020-05-11 MED ORDER — FENTANYL CITRATE (PF) 250 MCG/5ML IJ SOLN
INTRAMUSCULAR | Status: DC | PRN
  Administered 2020-05-11: 50 ug via INTRAVENOUS

## 2020-05-11 MED ORDER — BISACODYL 5 MG PO TBEC: 5.0000 mg | DELAYED_RELEASE_TABLET | Freq: Every day | ORAL | Status: DC | PRN

## 2020-05-11 MED ORDER — FENTANYL CITRATE (PF) 100 MCG/2ML IJ SOLN
25.0000 ug | INTRAMUSCULAR | Status: DC | PRN
  Administered 2020-05-11 (×2): 25 ug via INTRAVENOUS

## 2020-05-11 MED ORDER — ACETAMINOPHEN 325 MG PO TABS
650.0000 mg | ORAL_TABLET | Freq: Three times a day (TID) | ORAL | Status: AC
  Administered 2020-05-12 – 2020-05-14 (×8): 650 mg via ORAL
  Filled 2020-05-11 (×9): qty 2

## 2020-05-11 MED ORDER — MORPHINE SULFATE (PF) 2 MG/ML IV SOLN
2.0000 mg | INTRAVENOUS | Status: DC | PRN
Start: 2020-05-11 — End: 2020-05-12
  Administered 2020-05-11: 2 mg via INTRAVENOUS
  Filled 2020-05-11: qty 1

## 2020-05-11 MED ORDER — ACETAMINOPHEN 10 MG/ML IV SOLN
1000.0000 mg | Freq: Once | INTRAVENOUS | Status: AC
  Administered 2020-05-11: 1000 mg via INTRAVENOUS

## 2020-05-11 MED ORDER — SODIUM CHLORIDE 0.9% FLUSH
3.0000 mL | Freq: Two times a day (BID) | INTRAVENOUS | Status: DC
  Administered 2020-05-12 – 2020-05-13 (×2): 3 mL via INTRAVENOUS

## 2020-05-11 MED ORDER — SUGAMMADEX SODIUM 200 MG/2ML IV SOLN
INTRAVENOUS | Status: DC | PRN
  Administered 2020-05-11: 200 mg via INTRAVENOUS

## 2020-05-11 SURGICAL SUPPLY — 69 items
BASEPLATE GLENOID STD REV 42 (Joint) ×2 IMPLANT
BASEPLATE GLENOSPHERE 25 STD (Miscellaneous) ×2 IMPLANT
BIT DRILL 3.2 PERIPHERAL SCREW (BIT) ×2 IMPLANT
BLADE SAW SAG 73X25 THK (BLADE) ×1
BLADE SAW SGTL 73X25 THK (BLADE) ×1 IMPLANT
CHLORAPREP W/TINT 26 (MISCELLANEOUS) ×4 IMPLANT
CLSR STERI-STRIP ANTIMIC 1/2X4 (GAUZE/BANDAGES/DRESSINGS) ×2 IMPLANT
COOLER ICEMAN CLASSIC (MISCELLANEOUS) ×2 IMPLANT
COVER SURGICAL LIGHT HANDLE (MISCELLANEOUS) ×2 IMPLANT
COVER WAND RF STERILE (DRAPES) ×2 IMPLANT
DERMABOND ADHESIVE PROPEN (GAUZE/BANDAGES/DRESSINGS) ×1
DERMABOND ADVANCED .7 DNX6 (GAUZE/BANDAGES/DRESSINGS) ×1 IMPLANT
DRAPE HALF SHEET 40X57 (DRAPES) ×2 IMPLANT
DRAPE INCISE IOBAN 66X45 STRL (DRAPES) ×4 IMPLANT
DRAPE ORTHO SPLIT 77X108 STRL (DRAPES) ×4
DRAPE SURG ORHT 6 SPLT 77X108 (DRAPES) ×2 IMPLANT
DRAPE SWITCH (DRAPES) ×2 IMPLANT
DRAPE U-SHAPE 47X51 STRL (DRAPES) ×4 IMPLANT
DRSG AQUACEL ADVANTAGE 4X5 (GAUZE/BANDAGES/DRESSINGS) ×2 IMPLANT
DRSG AQUACEL AG ADV 3.5X 6 (GAUZE/BANDAGES/DRESSINGS) ×2 IMPLANT
ELECT REM PT RETURN 9FT ADLT (ELECTROSURGICAL) ×2
ELECTRODE REM PT RTRN 9FT ADLT (ELECTROSURGICAL) ×1 IMPLANT
GLOVE BIO SURGEON STRL SZ 6.5 (GLOVE) ×2 IMPLANT
GLOVE BIOGEL PI IND STRL 8 (GLOVE) ×1 IMPLANT
GLOVE BIOGEL PI INDICATOR 8 (GLOVE) ×1
GLOVE ECLIPSE 8.0 STRL XLNG CF (GLOVE) ×4 IMPLANT
GLOVE INDICATOR 6.5 STRL GRN (GLOVE) ×2 IMPLANT
GOWN STRL REUS W/ TWL LRG LVL3 (GOWN DISPOSABLE) ×1 IMPLANT
GOWN STRL REUS W/ TWL XL LVL3 (GOWN DISPOSABLE) ×1 IMPLANT
GOWN STRL REUS W/TWL LRG LVL3 (GOWN DISPOSABLE) ×2
GOWN STRL REUS W/TWL XL LVL3 (GOWN DISPOSABLE) ×2
GUIDEWIRE GLENOID 2.5X220 (WIRE) ×2 IMPLANT
HANDPIECE INTERPULSE COAX TIP (DISPOSABLE) ×2
IMPL REVERSE SHOULDER 0X3.5 (Shoulder) ×1 IMPLANT
IMPLANT REVERSE SHOULDER 0X3.5 (Shoulder) ×2 IMPLANT
INSERT REV SHOULDER 42X9 12-5 (Insert) ×2 IMPLANT
KIT BASIN OR (CUSTOM PROCEDURE TRAY) ×2 IMPLANT
KIT STABILIZATION SHOULDER (MISCELLANEOUS) ×2 IMPLANT
KIT TURNOVER KIT B (KITS) ×2 IMPLANT
MANIFOLD NEPTUNE II (INSTRUMENTS) ×2 IMPLANT
NEEDLE HYPO 25GX1X1/2 BEV (NEEDLE) ×2 IMPLANT
NEEDLE MAYO TROCAR (NEEDLE) ×2 IMPLANT
NS IRRIG 1000ML POUR BTL (IV SOLUTION) ×2 IMPLANT
PACK SHOULDER (CUSTOM PROCEDURE TRAY) ×2 IMPLANT
PAD ARMBOARD 7.5X6 YLW CONV (MISCELLANEOUS) ×4 IMPLANT
PAD COLD SHLDR WRAP-ON (PAD) ×2 IMPLANT
RESTRAINT HEAD UNIVERSAL NS (MISCELLANEOUS) ×2 IMPLANT
SCREW 5.0X18 (Screw) ×2 IMPLANT
SCREW BONE 6.5X40 SM (Screw) ×2 IMPLANT
SCREW PERIPHERAL 30 (Screw) ×2 IMPLANT
SCREW PERIPHERAL 42 (Screw) ×2 IMPLANT
SET HNDPC FAN SPRY TIP SCT (DISPOSABLE) ×1 IMPLANT
SLING ARM IMMOBILIZER LRG (SOFTGOODS) ×2 IMPLANT
SPONGE LAP 18X18 RF (DISPOSABLE) ×2 IMPLANT
STEM HUMERAL AEQUALIS 115XS6B (Stem) ×2 IMPLANT
STRIP CLOSURE SKIN 1/2X4 (GAUZE/BANDAGES/DRESSINGS) ×2 IMPLANT
SUCTION FRAZIER HANDLE 10FR (MISCELLANEOUS) ×2
SUCTION TUBE FRAZIER 10FR DISP (MISCELLANEOUS) ×1 IMPLANT
SUT ETHIBOND 2 V 37 (SUTURE) ×2 IMPLANT
SUT ETHIBOND NAB CT1 #1 30IN (SUTURE) ×2 IMPLANT
SUT FIBERWIRE #5 38 CONV NDL (SUTURE) ×8
SUT MNCRL AB 3-0 PS2 18 (SUTURE) ×2 IMPLANT
SUT VIC AB 2-0 CT1 27 (SUTURE) ×2
SUT VIC AB 2-0 CT1 TAPERPNT 27 (SUTURE) ×1 IMPLANT
SUT VIC AB 3-0 SH 27 (SUTURE) ×2
SUT VIC AB 3-0 SH 27X BRD (SUTURE) ×1 IMPLANT
SUTURE FIBERWR #5 38 CONV NDL (SUTURE) ×4 IMPLANT
TOWEL GREEN STERILE (TOWEL DISPOSABLE) ×2 IMPLANT
WATER STERILE IRR 1000ML POUR (IV SOLUTION) ×2 IMPLANT
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
## 2020-05-11 NOTE — Interval H&P Note (Signed)
-   As below

## 2020-05-11 NOTE — Anesthesia Preprocedure Evaluation (Addendum)
Anesthesia Evaluation  Patient identified by MRN, date of birth, ID band Patient confused    Reviewed: Allergy & Precautions, NPO status , Patient's Chart, lab work & pertinent test results  History of Anesthesia Complications Negative for: history of anesthetic complications  Airway Mallampati: III  TM Distance: >3 FB Neck ROM: Full    Dental no notable dental hx. (+) Dental Advisory Given   Pulmonary pneumonia, resolved, former smoker,    Pulmonary exam normal        Cardiovascular hypertension, Pt. on home beta blockers Normal cardiovascular exam  Study Conclusions   - Left ventricle: The cavity size was normal. Systolic function was  normal. The estimated ejection fraction was in the range of 60%  to 65%. Wall motion was normal; there were no regional wall  motion abnormalities. Doppler parameters are consistent with  abnormal left ventricular relaxation (grade 1 diastolic  dysfunction).  - Aortic valve: Transvalvular velocity was within the normal range.  There was no stenosis. There was no regurgitation.  - Mitral valve: Mildly calcified annulus. Transvalvular velocity  was within the normal range. There was no evidence for stenosis.  There was no regurgitation.  - Left atrium: The atrium was severely dilated.  - Right ventricle: The cavity size was normal. Wall thickness was  normal. Systolic function was normal.  - Right atrium: The atrium was dilated.  - Tricuspid valve: There was trivial regurgitation.  - Pulmonary arteries: Systolic pressure was within the normal  range.    Neuro/Psych Seizures -,  PSYCHIATRIC DISORDERS Depression First seizer last night: neurology ok with surgery TBI 1990 Brain cyst removal 1990 CVA    GI/Hepatic Neg liver ROS, GERD  Medicated,Hx of SBO   Endo/Other  negative endocrine ROS  Renal/GU negative Renal ROS     Musculoskeletal negative musculoskeletal  ROS (+)   Abdominal   Peds  Hematology negative hematology ROS (+)   Anesthesia Other Findings   Reproductive/Obstetrics                            Anesthesia Physical Anesthesia Plan  ASA: III  Anesthesia Plan: General   Post-op Pain Management: GA combined w/ Regional for post-op pain   Induction: Intravenous  PONV Risk Score and Plan: 2 and Ondansetron and Dexamethasone  Airway Management Planned: Oral ETT  Additional Equipment:   Intra-op Plan:   Post-operative Plan: Extubation in OR  Informed Consent: I have reviewed the patients History and Physical, chart, labs and discussed the procedure including the risks, benefits and alternatives for the proposed anesthesia with the patient or authorized representative who has indicated his/her understanding and acceptance.     Dental advisory given and Consent reviewed with POA  Plan Discussed with: Anesthesiologist, CRNA and Surgeon  Anesthesia Plan Comments:        Anesthesia Quick Evaluation

## 2020-05-11 NOTE — ED Triage Notes (Signed)
Pt transported from home by EMS after witnessed tonic clonic activity by wife, post ictal/ resistive to care per EMS, unable to obtain IV. Versed 5mg  IM given.  No hx of seizures.

## 2020-05-11 NOTE — Plan of Care (Signed)
  Problem: Nutrition: Goal: Adequate nutrition will be maintained Outcome: Progressing   Problem: Pain Managment: Goal: General experience of comfort will improve Outcome: Progressing   Problem: Safety: Goal: Ability to remain free from injury will improve Outcome: Progressing   

## 2020-05-11 NOTE — Transfer of Care (Signed)
Immediate Anesthesia Transfer of Care Note  Patient: MOE GRACA  Procedure(s) Performed: REVERSE SHOULDER ARTHROPLASTY (Left Shoulder)  Patient Location: PACU  Anesthesia Type:General and Regional  Level of Consciousness: drowsy  Airway & Oxygen Therapy: Patient Spontanous Breathing and Patient connected to face mask oxygen  Post-op Assessment: Report given to RN and Post -op Vital signs reviewed and stable  Post vital signs: Reviewed and stable  Last Vitals:  Vitals Value Taken Time  BP 178/88 05/11/20 1508  Temp    Pulse 79 05/11/20 1509  Resp 15 05/11/20 1509  SpO2 99 % 05/11/20 1509  Vitals shown include unvalidated device data.  Last Pain:  Vitals:   05/11/20 1210  TempSrc: Oral         Complications: No complications documented.
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
## 2020-05-11 NOTE — ED Notes (Signed)
Pt to CT via stretcher

## 2020-05-11 NOTE — ED Notes (Signed)
Wife remains at the bedside

## 2020-05-11 NOTE — Progress Notes (Signed)
Patient admitted to room. Alert and confusion. No is agitated trying to out of bed. Wife in room and not want pt have pain med at this time.

## 2020-05-11 NOTE — Anesthesia Procedure Notes (Signed)
Anesthesia Regional Block: Interscalene brachial plexus block   Pre-Anesthetic Checklist: ,, timeout performed, Correct Patient, Correct Site, Correct Laterality, Correct Procedure, Correct Position, site marked, Risks and benefits discussed,  Surgical consent,  Pre-op evaluation,  At surgeon's request and post-op pain management  Laterality: Left  Prep: chloraprep       Needles:  Injection technique: Single-shot  Needle Type: Echogenic Stimulator Needle     Needle Length: 5cm  Needle Gauge: 22     Additional Needles:   Narrative:  Start time: 05/11/2020 12:46 PM End time: 05/11/2020 12:56 PM Injection made incrementally with aspirations every 5 mL.  Performed by: Personally  Anesthesiologist: Duane Boston, MD  Additional Notes: Functioning IV was confirmed and monitors applied.  A 19mm 22ga echogenic arrow stimulator was used. Sterile prep and drape,hand hygiene and sterile gloves were used.Ultrasound guidance: relevant anatomy identified, needle position confirmed, local anesthetic spread visualized around nerve(s)., vascular puncture avoided.  Image printed for medical record.  Negative aspiration and negative test dose prior to incremental administration of local anesthetic. The patient tolerated the procedure well.
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
## 2020-05-11 NOTE — ED Provider Notes (Signed)
87 yo male w/ hx of TBI presenting to ED with suspected new onset seizure, witnessed by wife.  No prior hx of seizures.  No proceeding trauma.  EMS gave 5 mg IM versed.  Found to have left anterior shoulder dislocation on xray.  Attempted relocation by EDP, unsuccessful.  Discussed with Dr Renda Rolls from orthopedics who asked for medical admission and anticipates surgery for the shoulder.  Requested CT shoulder which is pending.  Pending medical admission.  Update 845 pm - spoke to Dr Griffin Basil from orthopedics, they are planning for OR at 1 pm today for reduction.  I also spoke to Dr Lorrin Goodell from neurology, who stated IV keppra loading dose and 500 mg keppra BID would be reasonable given the patient's hx of TBI.  He reports general anesthesia for surgery should be fine, as this is typically an effective anti-epileptic.  No other emergent recommendations at this time.  If the medical team has further concerns they can consult the neurology team as an inpatient.  Pending hospital admission.  10 am - signed out to Dr Tamala Julian hospitalist.   Wyvonnia Dusky, MD 05/11/20 [phone]

## 2020-05-11 NOTE — Anesthesia Procedure Notes (Signed)
Procedure Name: Intubation Date/Time: 05/11/2020 1:18 PM Performed by: Clearnce Sorrel, CRNA Pre-anesthesia Checklist: Patient identified, Emergency Drugs available, Suction available, Patient being monitored and Timeout performed Patient Re-evaluated:Patient Re-evaluated prior to induction Oxygen Delivery Method: Circle system utilized Preoxygenation: Pre-oxygenation with 100% oxygen Induction Type: IV induction Ventilation: Mask ventilation without difficulty and Oral airway inserted - appropriate to patient size Laryngoscope Size: Mac and 4 Grade View: Grade I Tube type: Oral Tube size: 7.5 mm Number of attempts: 1 Airway Equipment and Method: Stylet Placement Confirmation: ETT inserted through vocal cords under direct vision,  positive ETCO2 and breath sounds checked- equal and bilateral Secured at: 23 cm Tube secured with: Tape Dental Injury: Teeth and Oropharynx as per pre-operative assessment

## 2020-05-11 NOTE — Op Note (Signed)
Orthopaedic Surgery Operative Note (CSN: [id number])  DAMYAN CORNE  1930-06-30 Date of Surgery: 05/11/2020   Diagnoses:  Left shoulder fracture dislocation  Procedure: Open treatment of left shoulder fracture dislocation 23660 Open treatment glenoid fracture 23585 Left reverse total Shoulder Arthroplasty 23472    Operative Finding Successful completion of planned procedure.  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  He had attritional loss likely of his anterior glenoid but clearly had a new fracture of his glenoid.  We are able to use her implants to fixate the glenoid with the screws from the baseplate as well as obtain good purchase.  Patient's continued instability as well as his dementia put him at high risk for continued instability.  He completely torn his superior cuff but he had some remnant of subscapularis that was intact which we repaired.  X-ray nerve was intact to a tug test at the end of the case in the beginning of the case.  Patient is at high risk for continued instability as well as acromial stress fracture based on his inability to follow directions and his overall tension at which we had to place his implant to avoid continued instability.  We felt that there is still a better option for him rather than a hemiarthroplasty which was predictably lead to poor function.  If he fails conversion to a hemiarthroplasty is likely the best option.  Post-operative plan: The patient will be NWB in sling.  The patient will be Admitted to the medicine service for management however can be discharged from orthopedic perspective whenever appropriate even as soon as tomorrow.  DVT prophylaxis Aspirin 81 mg twice daily for 6 weeks.  Pain control with PRN pain medication preferring oral medicines.  Follow up plan will be scheduled in approximately 7 days for incision check and XR.  Physical therapy to start after first visit.  Implants: Tornier size 6B  stem, 0 high offset tray with a 42+9 poly-, 42 standard glenosphere, 25 baseplate with a 40 center screw, peripheral 3 screws  Post-Op Diagnosis: Same Surgeons:Primary: Hiram Gash, MD Assistants:Caroline McBane PA-C Location: [facility] OR ROOM 06 Anesthesia: General with Exparel Interscalene Antibiotics: Ancef 2g preop, Vancomycin 1000mg  locally Tourniquet time: None Estimated Blood Loss: 937 Complications: None Specimens: None Implants: Implant Name Type Inv. Item Serial No. Manufacturer Lot No. LRB No. Used Action  BASEPLATE GLENOSPHERE 16RC STD - [id number] Miscellaneous BASEPLATE GLENOSPHERE 10CH STD [id number] [employer]  Left 1 Implanted  BASEPLATE GLENOID STD REV 42 - [id number] Joint BASEPLATE GLENOID STD REV 42 [id number] [place]  Left 1 Implanted  INSRT REV SHOULDER 42X9MM-12-5 - [id number] Insert INSRT REV SHOULDER 42X9MM-12-5 [id number] [employer]  Left 1 Implanted  IMPLANT REVERSE SHOULDER 0X3.5 - [id number] Shoulder IMPLANT REVERSE SHOULDER 0X3.5 [id number] [employer]  Left 1 Implanted  STEM HUMERAL AEQUALIS 115XS6B - [id number] Stem STEM HUMERAL AEQUALIS 115XS6B [id number] [employer]  Left 1 Implanted    Indications for Surgery:   GIANNO VOLNER is a 87 y.o. male with seizure and history of previous shoulder instability with acute fractures and dislocation of the glenoid and the humerus with a locked dislocation irreducible in the ER.  Patient had continued pain and due to his acute traumatic dislocation we felt that urgent surgery was necessary.  Benefits and risks of operative and nonoperative management were discussed prior to surgery with patient/guardian(s) and informed consent form was completed.  Infection and need for further surgery were discussed as was prosthetic stability  and cuff issues.  We additionally specifically discussed risks of axillary nerve injury, infection, periprosthetic fracture, continued pain and longevity of implants prior to beginning  procedure.      Procedure:   The patient was identified in the preoperative holding area where the surgical site was marked. Block placed by anesthesia with exparel.  The patient was taken to the OR where a procedural timeout was called and the above noted anesthesia was induced.  The patient was positioned beachchair on allen table with spider arm positioner.  Preoperative antibiotics were dosed.  The patient's left shoulder was prepped and draped in the usual sterile fashion.  A second preoperative timeout was called.       Standard deltopectoral approach was performed with a #10 blade. We dissected down to the subcutaneous tissues and the cephalic vein was taken laterally with the deltoid. Clavipectoral fascia was incised in line with the incision. Deep retractors were placed. The long of the biceps tendon was identified and there was significant tenosynovitis present.  Tenodesis was performed to the pectoralis tendon with #2 Ethibond. The remaining biceps was followed up into the rotator interval where it was released.   We at this point performed an open reduction of the humeral head reducing it back onto the glenoid.  The subscapularis was taken down in a full thickness layer with capsule along the humeral neck extending inferiorly around the humeral head. We continued releasing the capsule directly off of the osteophytes inferiorly all the way around the corner. This allowed [place] to dislocate the humeral head.   The superior rotator cuff was carefully examined and noted to be irreperably torn.  The decision was confirmed that a reverse total shoulder was indicated for this patient.  A humeral cutting guide was inserted down the intramedullary canal. The version was set at 20 of retroversion. Humeral osteotomy was performed with an oscillating saw. The head fragment was passed off the back table. A starter awl was used to open the humeral canal. We next used T-handle straight sound reamers to  ream up to an appropriate fit. A chisel was used to remove proximal humeral bone. We then broached starting with a size one broach and broaching up to 6 which obtained an appropriate fit. The broach handle was removed. A cut protector was placed. The broach handle was removed and a cut protector was placed. The humerus was retracted posteriorly and we turned our attention to glenoid exposure.  The subscapularis was again identified and immediately we took care to palpate the axillary nerve anteriorly and verify its position with gentle palpation as well as the tug test.  We then released the SGHL with bovie cautery prior to placing a curved mayo at the junction of the anterior glenoid well above the axillary nerve and bluntly dissecting the subscapularis from the capsule.  We then carefully protected the axillary nerve as we gently released the inferior capsule to fully mobilize the subscapularis.  An anterior deltoid retractor was then placed as well as a small Hohmann retractor superiorly.   The glenoid had 30% anterior bone loss from the fracture that was comminuted.  We were able to note that there was still likely vault in place and by shifting her baseplate slightly posterior we were able to stay within the vault likely and get good bone contact for our baseplate.  The glenoid drill guide was placed and used to drill a guide pin in the center, slightly posterior position. The glenoid face was then  reamed concentrically over the guide wire. The center hole was drilled over the guidepin in a near anatomic angle of version. Next the  glenoid vault was drilled back to a depth of 40 mm.  We tapped and then placed a 19mm size baseplate with 70mm lateralization was selected with a 6.5 mm x 40 mm length central screw.  The base plate was screwed into the glenoid vault obtaining secure fixation. We next placed superior and inferior locking screws for additional fixation as well as the posterior screw.  Next a 42  mm glenosphere was selected and impacted onto the baseplate. The center screw was tightened.  We turned attention back to the humeral side. The cut protector was removed. We trialed with multiple size tray and polyethylene options and selected a 9 which provided good stability and range of motion without excess soft tissue tension. The offset was dialed in to match the normal anatomy. The shoulder was trialed.  There was good ROM in all planes and the shoulder was stable with no inferior translation.  The real humeral implants were opened after again confirming sizes.  The trial was removed. #5 Fiberwire x4 sutures passed through the humeral neck for subscap repair. The humeral component was press-fit obtaining a secure fit. A +0 high offset tray was selected and impacted onto the stem.  A 42 +9 polyethylene liner was impacted onto the stem.  The joint was reduced and thoroughly irrigated with pulsatile lavage. Subscap was repaired back with #5 Fiberwire sutures through bone tunnels. Hemostasis was obtained. The deltopectoral interval was reapproximated with #1 Ethibond. The subcutaneous tissues were closed with 2-0 Vicryl and the skin was closed with running monocryl.    The wounds were cleaned and dried and an Aquacel dressing was placed. The drapes taken down. The arm was placed into sling with abduction pillow. Patient was awakened, extubated, and transferred to the recovery room in stable condition. There were no intraoperative complications. The sponge, needle, and attention counts were correct at the end of the case.        Noemi Chapel, PA-C, present and scrubbed throughout the case, critical for completion in a timely fashion, and for retraction, instrumentation, closure.

## 2020-05-12 ENCOUNTER — Other Ambulatory Visit: Payer: Self-pay

## 2020-05-12 DIAGNOSIS — I10 Essential (primary) hypertension: Secondary | ICD-10-CM

## 2020-05-12 DIAGNOSIS — S43015A Anterior dislocation of left humerus, initial encounter: Secondary | ICD-10-CM | POA: Diagnosis not present

## 2020-05-12 LAB — CBC
HCT: 35.5 % — ABNORMAL LOW (ref 39.0–52.0)
Hemoglobin: 12.4 g/dL — ABNORMAL LOW (ref 13.0–17.0)
MCH: 30.2 pg (ref 26.0–34.0)
MCHC: 34.9 g/dL (ref 30.0–36.0)
MCV: 86.6 fL (ref 80.0–100.0)
Platelets: 182 10*3/uL (ref 150–400)
RBC: 4.1 MIL/uL — ABNORMAL LOW (ref 4.22–5.81)
RDW: 12.9 % (ref 11.5–15.5)
WBC: 15.8 10*3/uL — ABNORMAL HIGH (ref 4.0–10.5)
nRBC: 0 % (ref 0.0–0.2)

## 2020-05-12 LAB — CK: Total CK: 569 U/L — ABNORMAL HIGH (ref 49–397)

## 2020-05-12 LAB — BASIC METABOLIC PANEL
Anion gap: 11 (ref 5–15)
BUN: 17 mg/dL (ref 8–23)
CO2: 20 mmol/L — ABNORMAL LOW (ref 22–32)
Calcium: 8.5 mg/dL — ABNORMAL LOW (ref 8.9–10.3)
Chloride: 109 mmol/L (ref 98–111)
Creatinine, Ser: 0.89 mg/dL (ref 0.61–1.24)
GFR, Estimated: >60 mL/min (ref >60)
Glucose, Bld: 145 mg/dL — ABNORMAL HIGH (ref 70–99)
Potassium: 3.8 mmol/L (ref 3.5–5.1)
Sodium: 140 mmol/L (ref 135–145)

## 2020-05-12 MED ORDER — SERTRALINE HCL 100 MG PO TABS
100.0000 mg | ORAL_TABLET | Freq: Every day | ORAL | Status: DC
  Administered 2020-05-12 – 2020-05-22 (×11): 100 mg via ORAL
  Filled 2020-05-12 (×11): qty 1

## 2020-05-12 MED ORDER — LORAZEPAM 2 MG/ML IJ SOLN: 1.0000 mg | INTRAMUSCULAR | Status: DC | PRN

## 2020-05-12 MED ORDER — CHLORHEXIDINE GLUCONATE CLOTH 2 % EX PADS
6.0000 | MEDICATED_PAD | Freq: Every day | CUTANEOUS | Status: DC
  Administered 2020-05-13 – 2020-05-20 (×6): 6 via TOPICAL

## 2020-05-12 MED ORDER — HALOPERIDOL LACTATE 5 MG/ML IJ SOLN: 2.0000 mg | Freq: Four times a day (QID) | INTRAMUSCULAR | Status: DC | PRN

## 2020-05-12 NOTE — Anesthesia Postprocedure Evaluation (Signed)
Anesthesia Post Note  Patient: Eric George  Procedure(s) Performed: REVERSE SHOULDER ARTHROPLASTY (Left Shoulder)     Patient location during evaluation: PACU Anesthesia Type: Regional and General Level of consciousness: awake and alert Pain management: pain level controlled Vital Signs Assessment: post-procedure vital signs reviewed and stable Respiratory status: spontaneous breathing, nonlabored ventilation, respiratory function stable and patient connected to nasal cannula oxygen Cardiovascular status: blood pressure returned to baseline and stable Postop Assessment: no apparent nausea or vomiting Anesthetic complications: no   No complications documented.                Mayre Bury DANIEL

## 2020-05-12 NOTE — Progress Notes (Signed)
Inpatient Rehab Admissions Coordinator Note:   Per PT/OT recommendations, pt was screened for CIR candidacy by Gayland Curry, MS, CCC-SLP.  At this time we are recommending an inpatient rehab consult.  AC will place consult order per protocol.  Please contact me with questions.    Gayland Curry, [place], [place] Admissions Coordinator [phone] 05/12/20 5:43 PM

## 2020-05-12 NOTE — Progress Notes (Signed)
   ORTHOPAEDIC PROGRESS NOTE  s/p Procedure(s): REVERSE SHOULDER ARTHROPLASTY  SUBJECTIVE: Patient resting in hospital bed. Became very agitated last night. Was given ativan and haldol to help with the agitation. His wife and son had to hold him down as he was trying to get out of bed and take off his sling, IV lines, etc. Finally went to sleep around 0100 per nursing note. He has not complained of pain in his left shoulder.   OBJECTIVE: PE: General: resting comfortably in hospital bed, NAD Left shoulder: dressing CDI. ROM not tested. Could not test neurological status due to patient's agitation and confusion. Warm well perfused hand.   Vitals:   05/12/20 0027 05/12/20 0440  BP: 118/62 125/76  Pulse: (!) 104 95  Resp: 17 16  Temp: 97.7 F (36.5 C) 97.8 F (36.6 C)  SpO2: 90% 96%     ASSESSMENT: Eric George is a 87 y.o. male POD#1  PLAN: Weightbearing: NWB LUE   - okay to use arm for activities of daily living including feeding but minimizing shoulder motion.   - Okay for passive and active range of motion of the elbow and wrist keeping the hands in front of the abdomen and chest would be reasonable while sitting and supervised  - can use left arm when using his walker  - sling should be on at all times except for exercises, certain ADLs, and when using walker Insicional and dressing care: Reinforce dressings as needed Orthopedic device(s): None Showering: Post-op day #2 VTE prophylaxis: Lovenox 40mg  qd while in the hospital will transition to aspirin outpatient Pain control: PRN pain medications, preferring oral medications. Minimize narcotics to avoid worsening delirium Post-op delirium: per medicine "Resume sertraline 100mg  in the morning. Haldol 2 mg IV every 6 hours as needed Will avoid using Ativan, however reserved for severe agitation and difficult to control behavior." Follow - up plan: 1 week in office with Dr. Griffin Basil for repeat x-rays Dispo: TBD. PT/OT  evaluations today. Will request TOC consult. Patient's wife would like to speak to [facility] in person.   Contact information:  Dr. Ophelia Charter, Noemi Chapel PA-C, After hours and holidays please check [email or web address] for group call information for [facility]  Judyann Munson 05/12/2020

## 2020-05-12 NOTE — Progress Notes (Signed)
Occupational Therapy Evaluation Patient Details Name: Eric George MRN: [id number] DOB: 13-Dec-1930 Today's Date: 05/12/2020    History of Present Illness 87 y.o. male with TBI 1990, stroke 2013, HTN, HLD, prostate cancer, GERD, SBO, short-term memory loss presented to [facility] via EMS after having a seizure. He was also found to have a left shoulder fracture dislocation. Underwent L Reverse TSA 1/8. Acute metabolic encephalopahty/delirium and disorientation requiring medicaiton due to agitation.   Clinical Impression   PTA lives with wife and is able to ambulate and complete his ADL tasks @ modified independent level. Pt is usually able to do such tasks as making himself a sandwich and going to the gym with his wife. Pt lethargic - most likely related to medication, poor sleep and delirium, requiring +2 Mod A for stand pivot transfer to chair. Requires Max A with ADL tasks. Began education regarding management of LUE according to precautions. Pt more alert and able to complete simple grooming tasks with set up after up in chair. Given PLOF, and significant functional decline, recommend CIR to maximize functional level of independence and decrease burden of care to facilitate safe DC home to familiar environment with wife.     Follow Up Recommendations  CIR;Supervision/Assistance - 24 hour    Equipment Recommendations  3 in 1 bedside commode    Recommendations for Other Services Rehab consult     Precautions / Restrictions Precautions Precautions: Fall;Shoulder Type of Shoulder Precautions: no ROM shoulder; elbow/wrist/hadn ROM OK Shoulder Interventions: Shoulder sling/immobilizer;At all times;Off for dressing/bathing/exercises Precaution Booklet Issued: No Required Braces or Orthoses: Sling Restrictions LUE Weight Bearing: Non weight bearing      Mobility Bed Mobility Overal bed mobility: Needs Assistance Bed Mobility: Supine to Sit     Supine to sit: +2  for physical assistance;Mod assist     General bed mobility comments: mostly due to level of arousal and keeping pt from using LUE    Transfers Overall transfer level: Needs assistance Equipment used: 2 person hand held assist Transfers: Sit to/from [facility] Sit to Stand: Mod assist;+2 physical assistance Stand pivot transfers: Mod assist;+2 physical assistance       General transfer comment: difficulty stepping; keeping L knee partially flexed    Balance Overall balance assessment: Needs assistance;History of Falls   Sitting balance-Leahy Scale: Poor Sitting balance - Comments: posterior lean     Standing balance-Leahy Scale: Poor Standing balance comment: posterior bias                           ADL either performed or assessed with clinical judgement   ADL Overall ADL's : Needs assistance/impaired Eating/Feeding: Minimal assistance Eating/Feeding Details (indicate cue type and reason): poor PO intake duet o lethargy Grooming: Brushing hair;Oral care;Set up;Supervision/safety;Sitting   Upper Body Bathing: Moderate assistance;Sitting   Lower Body Bathing: Moderate assistance;Sit to/from stand   Upper Body Dressing : Maximal assistance;Sitting   Lower Body Dressing: Maximal assistance;Sit to/from stand   Toilet Transfer: Moderate assistance;+2 for physical assistance;Stand-pivot Toilet Transfer Details (indicate cue type and reason): simulated   Toileting - Clothing Manipulation Details (indicate cue type and reason): foley     Functional mobility during ADLs: Moderate assistance;+2 for physical assistance       Vision         Perception     Praxis      Pertinent Vitals/Pain Pain Assessment: Faces Faces Pain Scale: Hurts a little bit Pain  Location: L shoulder; general discomfort Pain Descriptors / Indicators: Discomfort Pain Intervention(s): Limited activity within patient's tolerance;Repositioned;Ice applied     Hand  Dominance Right   Extremity/Trunk Assessment Upper Extremity Assessment Upper Extremity Assessment: LUE deficits/detail LUE Deficits / Details: s/p L reverse TSA; elbow/wrist/hand ROM WFL LUE Coordination: decreased gross motor   Lower Extremity Assessment Lower Extremity Assessment: Defer to PT evaluation   Cervical / Trunk Assessment Cervical / Trunk Assessment: Other exceptions (posterior bias)   Communication Communication Communication: HOH (wears hearing aids)   Cognition Arousal/Alertness: Lethargic;Suspect due to medications Behavior During Therapy: Flat affect Overall Cognitive Status: Impaired/Different from baseline Area of Impairment: Orientation;Attention;Memory;Following commands;Safety/judgement;Awareness;Problem solving                 Orientation Level: Disoriented to;Place;Time;Situation Current Attention Level: Focused Memory: Decreased recall of precautions;Decreased short-term memory Following Commands: Follows one step commands with increased time Safety/Judgement: Decreased awareness of safety;Decreased awareness of deficits Awareness: Intellectual Problem Solving: Slow processing;Decreased initiation;Difficulty sequencing;Requires verbal cues;Requires tactile cues     General Comments       Exercises Exercises: Other exercises Other Exercises Other Exercises: incentive spirometer x 5   Shoulder Instructions      Home Living Family/patient expects to be discharged to:: Private residence Living Arrangements: Spouse/significant other Available Help at Discharge: Family;Available 24 hours/day Type of Home: House Home Access: Stairs to enter [employer] of Steps: 2 Entrance Stairs-Rails: Right Home Layout: One level     Bathroom Shower/Tub: Tub/shower unit;Walk-in shower   Bathroom Toilet: Standard     Home Equipment: Environmental consultant - 2 wheels;Cane - single point ("extra high walker")          Prior Functioning/Environment  Level of Independence: Independent with assistive device(s)        Comments: occasionally used a cane; fmaily trying to get him to use his RW        OT Problem List: Decreased strength;Decreased range of motion;Decreased activity tolerance;Impaired balance (sitting and/or standing);Decreased coordination;Decreased cognition;Decreased safety awareness;Decreased knowledge of use of DME or AE;Decreased knowledge of precautions;Cardiopulmonary status limiting activity;Impaired UE functional use;Pain      OT Treatment/Interventions: Self-care/ADL training;Therapeutic exercise;Neuromuscular education;Energy conservation;DME and/or AE instruction;Therapeutic activities;Cognitive remediation/compensation;Patient/family education;Balance training    OT Goals(Current goals can be found in the care plan section) Acute Rehab OT Goals Patient Stated Goal: per family to bring him home with assistance OT Goal Formulation: With patient/family Time For Goal Achievement: 2020/05/30 Potential to Achieve Goals: Good  OT Frequency: Min 3X/week   Barriers to D/C:            Co-evaluation PT/OT/SLP Co-Evaluation/Treatment: Yes Reason for Co-Treatment: Complexity of the patient's impairments (multi-system involvement);Necessary to address cognition/behavior during functional activity;For patient/therapist safety;To address functional/ADL transfers   OT goals addressed during session: ADL's and self-care      AM-PAC OT "6 Clicks" Daily Activity     Outcome Measure Help from another person eating meals?: A Little Help from another person taking care of personal grooming?: A Little Help from another person toileting, which includes using toliet, bedpan, or urinal?: A Lot Help from another person bathing (including washing, rinsing, drying)?: A Lot Help from another person to put on and taking off regular upper body clothing?: A Lot Help from another person to put on and taking off regular lower body  clothing?: A Lot 6 Click Score: 14   End of Session Equipment Utilized During Treatment: Gait belt Nurse Communication: Mobility status;Weight bearing status;Precautions  Activity Tolerance: Patient tolerated treatment  well Patient left: in chair;with call bell/phone within reach;with chair alarm set;with family/visitor present  OT Visit Diagnosis: Unsteadiness on feet (R26.81);Other abnormalities of gait and mobility (R26.89);Muscle weakness (generalized) (M62.81);History of falling (Z91.81);Other symptoms and signs involving cognitive function;Pain Pain - Right/Left: Left Pain - part of body: Shoulder                Time: 3903-0092 OT Time Calculation (min): 56 min Charges:  OT General Charges $OT Visit: 1 Visit OT Evaluation $OT Eval Moderate Complexity: 1 Mod OT Treatments $Self Care/Home Management : 8-22 mins  Maurie Boettcher, OT/L   Acute OT Clinical Specialist [place] Pager [phone] Office [phone]   [facility] 05/12/2020, 5:05 PM

## 2020-05-12 NOTE — Progress Notes (Signed)
Patient keep taking cardiac monior off and fighting to have it put on,  notified MD.

## 2020-05-12 NOTE — Plan of Care (Signed)
  Problem: Activity: Goal: Risk for activity intolerance will decrease 05/12/2020 0805 by Mayme Genta, RN Outcome: Progressing 05/11/2020 1857 by Mayme Genta, RN Outcome: Progressing   Problem: Nutrition: Goal: Adequate nutrition will be maintained 05/12/2020 0805 by Mayme Genta, RN Outcome: Progressing 05/11/2020 1857 by Mayme Genta, RN Outcome: Progressing   Problem: Coping: Goal: Level of anxiety will decrease 05/12/2020 0805 by Mayme Genta, RN Outcome: Progressing 05/11/2020 1857 by Mayme Genta, RN Outcome: Progressing   Problem: Pain Managment: Goal: General experience of comfort will improve 05/12/2020 0805 by Mayme Genta, RN Outcome: Progressing 05/11/2020 1857 by Mayme Genta, RN Outcome: Progressing   Problem: Safety: Goal: Ability to remain free from injury will improve 05/12/2020 0805 by Mayme Genta, RN Outcome: Progressing 05/11/2020 1857 by Mayme Genta, RN Outcome: Progressing
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
## 2020-05-12 NOTE — Evaluation (Signed)
Physical Therapy Evaluation Patient Details Name: Eric George MRN: [id number] DOB: 1931-01-27 Today's Date: 05/12/2020   History of Present Illness  87 y.o. male with TBI 1990, stroke 2013, HTN, HLD, prostate cancer, GERD, SBO, short-term memory loss presented to [facility] Emergency Department via EMS after having a seizure. He was also found to have a left shoulder fracture dislocation. Underwent L Reverse TSA 1/8. Acute metabolic encephalopahty/delirium and disorientation requiring medicaiton due to agitation.  Clinical Impression   Pt admitted with above diagnosis. Comes from home where he lives with his wife in a single level home with a ramped entrance and accessible doorways; Prior to this seizure, shoulder injury, and admission; Eric George was independent with basic ADLs and walked without assist; Tended to furniture walk, and needed reminders from family to use assistive device; Presents to PT/OT with decr functional mobility, LUE movement and weight bearing restrictions that effect ADLs; Currently requires mod assist of two for bed mobility and transfers; Much better able to participate at this point than the past 24 hours; Better with hearing aids in; His wife [name] is devoted and knowledgable, and able to provide 24 hour assist/Supervision at home; He has been to CIR in the past with good outcomes as well; Worth considering CIR for post-acute rehab; I look forward to Eric George to weigh in;  Pt currently with functional limitations due to the deficits listed below (see PT Problem List). Pt will benefit from skilled PT to increase their independence and safety with mobility to allow discharge to the venue listed below.       Follow Up Recommendations CIR    Equipment Recommendations  Wheelchair (measurements PT);Wheelchair cushion (measurements PT);Hospital bed    Recommendations for Other Services       Precautions / Restrictions Precautions Precautions: Fall;Shoulder Type  of Shoulder Precautions: no ROM shoulder; elbow/wrist/hadn ROM OK Shoulder Interventions: Shoulder sling/immobilizer;At all times;Off for dressing/bathing/exercises Precaution Booklet Issued: No Required Braces or Orthoses: Sling Restrictions LUE Weight Bearing: Non weight bearing      Mobility  Bed Mobility Overal bed mobility: Needs Assistance Bed Mobility: Supine to Sit     Supine to sit: +2 for physical assistance;Mod assist     General bed mobility comments: mostly due to level of arousal and keeping pt from using LUE    Transfers Overall transfer level: Needs assistance Equipment used: 2 person hand held assist Transfers: Sit to/from [facility] Sit to Stand: Mod assist;+2 physical assistance Stand pivot transfers: Mod assist;+2 physical assistance       General transfer comment: difficulty stepping; keeping L knee partially flexed  Ambulation/Gait             General Gait Details: Pivotal steps bed to recliner as above  Stairs            Wheelchair Mobility    Modified Rankin (Stroke Patients Only)       Balance Overall balance assessment: Needs assistance;History of Falls   Sitting balance-Leahy Scale: Poor Sitting balance - Comments: posterior lean     Standing balance-Leahy Scale: Poor Standing balance comment: posterior bias                             Pertinent Vitals/Pain Pain Assessment: Faces Faces Pain Scale: Hurts a little bit Pain Location: L shoulder; general discomfort Pain Descriptors / Indicators: Discomfort Pain Intervention(s): Limited activity within patient's tolerance;Monitored during session    Home Living  Family/patient expects to be discharged to:: Private residence Living Arrangements: Spouse/significant other Available Help at Discharge: Family;Available 24 hours/day Type of Home: House Home Access: Stairs to enter Entrance Stairs-Rails: Right Entrance Stairs-Number of Steps:  2 Home Layout: One level Home Equipment: Walker - 2 wheels;Cane - single point      Prior Function Level of Independence: Independent with assistive device(s)         Comments: occasionally used a cane; fmaily trying to get him to use his RW     Hand Dominance   Dominant Hand: Right    Extremity/Trunk Assessment   Upper Extremity Assessment Upper Extremity Assessment: Defer to OT evaluation LUE Deficits / Details: s/p L reverse TSA; elbow/wrist/hand ROM WFL LUE Coordination: decreased gross motor    Lower Extremity Assessment Lower Extremity Assessment: Generalized weakness (Noted difficulty fully extending bil knees in standing)    Cervical / Trunk Assessment Cervical / Trunk Assessment: Other exceptions (posterior bias)  Communication   Communication: HOH (has hearing aids)  Cognition Arousal/Alertness: Lethargic;Suspect due to medications Behavior During Therapy: Flat affect Overall Cognitive Status: Impaired/Different from baseline Area of Impairment: Orientation;Attention;Memory;Following commands;Safety/judgement;Awareness;Problem solving                 Orientation Level: Disoriented to;Place;Time;Situation Current Attention Level: Focused Memory: Decreased recall of precautions;Decreased short-term memory Following Commands: Follows one step commands with increased time Safety/Judgement: Decreased awareness of safety;Decreased awareness of deficits Awareness: Intellectual Problem Solving: Slow processing;Decreased initiation;Difficulty sequencing;Requires verbal cues;Requires tactile cues        General Comments General comments (skin integrity, edema, etc.): Session conducted on room air and O2 sats tended to be in the low 90s, but did not note a drop past 91%    Exercises Other Exercises Other Exercises: incentive spirometer x 5   Assessment/Plan    PT Assessment Patient needs continued PT services  PT Problem List Decreased  strength;Decreased range of motion;Decreased activity tolerance;Decreased balance;Decreased mobility;Decreased coordination;Decreased cognition;Decreased knowledge of use of DME;Decreased safety awareness;Decreased knowledge of precautions;Cardiopulmonary status limiting activity;Pain       PT Treatment Interventions DME instruction;Gait training;Functional mobility training;Therapeutic activities;Therapeutic exercise;Balance training;Neuromuscular re-education;Cognitive remediation;Patient/family education;Wheelchair mobility training    PT Goals (Current goals can be found in the Care Plan section)  Acute Rehab PT Goals Patient Stated Goal: per family to bring him home with assistance PT Goal Formulation: With family Time For Goal Achievement: 06/13/2020 Potential to Achieve Goals: Fair    Frequency Min 3X/week   Barriers to discharge        Co-evaluation PT/OT/SLP Co-Evaluation/Treatment: Yes Reason for Co-Treatment: Complexity of the patient's impairments (multi-system involvement);For patient/therapist safety;To address functional/ADL transfers PT goals addressed during session: Mobility/safety with mobility OT goals addressed during session: ADL's and self-care       AM-PAC PT "6 Clicks" Mobility  Outcome Measure Help needed turning from your back to your side while in a flat bed without using bedrails?: A Lot Help needed moving from lying on your back to sitting on the side of a flat bed without using bedrails?: A Lot Help needed moving to and from a bed to a chair (including a wheelchair)?: A Lot Help needed standing up from a chair using your arms (e.g., wheelchair or bedside chair)?: A Lot Help needed to walk in hospital room?: A Lot Help needed climbing 3-5 steps with a railing? : Total 6 Click Score: 11    End of Session Equipment Utilized During Treatment: Gait belt (Sling) Activity Tolerance: Patient tolerated treatment well  Patient left: in chair;with call  bell/phone within reach;with chair alarm set;with family/visitor present Nurse Communication: Mobility status PT Visit Diagnosis: Other abnormalities of gait and mobility (R26.89);Other symptoms and signs involving the nervous system (R29.898)    Time: 0722-5750 PT Time Calculation (min) (ACUTE ONLY): 54 min   Charges:   PT Evaluation $PT Eval Moderate Complexity: 1 Mod PT Treatments $Therapeutic Activity: 8-22 mins        Roney Marion, PT  Acute Rehabilitation Services Pager [phone] Office [phone]   [place] 05/12/2020, 5:13 PM

## 2020-05-12 NOTE — Progress Notes (Signed)
Start of shift pt highly agitated trying to get out of bed- stating he had to go bathroom- attempted to get him up on his feet at bedside- he was unable and unable to or not comprehending to use urinal- obtained order to place indwelling foley cath-bladder scan showed >413ml. Placed 94fr immediate return 800. administered 1mg  of ativan about 45 minutes prior to attempt- to attempt to decrease agitation to facilitate insertion- it barely decreased- insertion went easily- he acknowlegded feeling relief but continued to fight family (son and wife) wife was unable to handle by herself and had called son. So about 2hrs after 1st dose of ativan- I administered the other 1mg  of ativan. Still wasn't decreasing agitation- obtained and administered 2mg  haldol - finally went to sleep about 0100.

## 2020-05-13 ENCOUNTER — Inpatient Hospital Stay (HOSPITAL_COMMUNITY): Payer: Medicare Other

## 2020-05-13 DIAGNOSIS — R569 Unspecified convulsions: Secondary | ICD-10-CM | POA: Diagnosis not present

## 2020-05-13 DIAGNOSIS — S43015A Anterior dislocation of left humerus, initial encounter: Secondary | ICD-10-CM | POA: Diagnosis not present

## 2020-05-13 LAB — CK: Total CK: 509 U/L — ABNORMAL HIGH (ref 49–397)

## 2020-05-13 LAB — COMPREHENSIVE METABOLIC PANEL
ALT: 15 U/L (ref 0–44)
AST: 26 U/L (ref 15–41)
Albumin: 3.1 g/dL — ABNORMAL LOW (ref 3.5–5.0)
Alkaline Phosphatase: 58 U/L (ref 38–126)
Anion gap: 9 (ref 5–15)
BUN: 19 mg/dL (ref 8–23)
CO2: 24 mmol/L (ref 22–32)
Calcium: 9 mg/dL (ref 8.9–10.3)
Chloride: 108 mmol/L (ref 98–111)
Creatinine, Ser: 0.9 mg/dL (ref 0.61–1.24)
GFR, Estimated: >60 mL/min (ref >60)
Glucose, Bld: 125 mg/dL — ABNORMAL HIGH (ref 70–99)
Potassium: 3.6 mmol/L (ref 3.5–5.1)
Sodium: 141 mmol/L (ref 135–145)
Total Bilirubin: 0.7 mg/dL (ref 0.3–1.2)
Total Protein: 5.4 g/dL — ABNORMAL LOW (ref 6.5–8.1)

## 2020-05-13 LAB — CBC WITH DIFFERENTIAL/PLATELET
Abs Immature Granulocytes: 0.06 10*3/uL (ref 0.00–0.07)
Basophils Absolute: 0 10*3/uL (ref 0.0–0.1)
Basophils Relative: 0 %
Eosinophils Absolute: 0.1 10*3/uL (ref 0.0–0.5)
Eosinophils Relative: 1 %
HCT: 35.5 % — ABNORMAL LOW (ref 39.0–52.0)
Hemoglobin: 12.3 g/dL — ABNORMAL LOW (ref 13.0–17.0)
Immature Granulocytes: 1 %
Lymphocytes Relative: 8 %
Lymphs Abs: 0.9 10*3/uL (ref 0.7–4.0)
MCH: 30.4 pg (ref 26.0–34.0)
MCHC: 34.6 g/dL (ref 30.0–36.0)
MCV: 87.9 fL (ref 80.0–100.0)
Monocytes Absolute: 1.2 10*3/uL — ABNORMAL HIGH (ref 0.1–1.0)
Monocytes Relative: 10 %
Neutro Abs: 8.8 10*3/uL — ABNORMAL HIGH (ref 1.7–7.7)
Neutrophils Relative %: 80 %
Platelets: 174 10*3/uL (ref 150–400)
RBC: 4.04 MIL/uL — ABNORMAL LOW (ref 4.22–5.81)
RDW: 13.1 % (ref 11.5–15.5)
WBC: 11 10*3/uL — ABNORMAL HIGH (ref 4.0–10.5)
nRBC: 0 % (ref 0.0–0.2)

## 2020-05-13 LAB — MAGNESIUM: Magnesium: 2 mg/dL (ref 1.7–2.4)

## 2020-05-13 LAB — PHOSPHORUS: Phosphorus: 2 mg/dL — ABNORMAL LOW (ref 2.5–4.6)

## 2020-05-13 MED ORDER — POTASSIUM & SODIUM PHOSPHATES 280-160-250 MG PO PACK
1.0000 | PACK | Freq: Three times a day (TID) | ORAL | Status: AC
  Administered 2020-05-13 – 2020-05-15 (×10): 1 via ORAL
  Filled 2020-05-13 (×11): qty 1

## 2020-05-13 NOTE — Progress Notes (Signed)
Physical Therapy Treatment Patient Details Name: Eric George MRN: [id number] DOB: 1930-07-15 Today's Date: 05/13/2020    History of Present Illness 87 y.o. male with TBI 1990, stroke 2013, HTN, HLD, prostate cancer, GERD, SBO, short-term memory loss presented to [facility] Emergency Department via EMS after having a seizure. He was also found to have a left shoulder fracture dislocation. Underwent L Reverse TSA 1/8. Acute metabolic encephalopahty/delirium and disorientation requiring medicaiton due to agitation.    PT Comments    Continuing work on functional mobility and activity tolerance;  Mr. Kanady showed goal-directed decisions, behavior and participation in PT session today; Requested to go to the bathroom, and showed some patience and self-control when asked to wait until the bathroom was setup and ready (had impulsively stood once initially); Much better stability in stance, without overt knee flexion in stance while walking to the bathroom; He did seem uncomfortable throughout session -- perhaps this can be attributed to his foley catheter; Overall improvement in functional mobility today compared to yesterday; Continue to recommend comprehensive inpatient rehab (CIR) for post-acute therapy needs.    Follow Up Recommendations  CIR     Equipment Recommendations  Wheelchair (measurements PT);Wheelchair cushion (measurements PT);Hospital bed    Recommendations for Other Services       Precautions / Restrictions Precautions Precautions: Fall;Shoulder Type of Shoulder Precautions: no ROM shoulder; elbow/wrist/hadn ROM OK Shoulder Interventions: Shoulder sling/immobilizer;At all times;Off for dressing/bathing/exercises Precaution Comments: Per Ortho note on 1/10: he can use the LUE to use a RW (Discussed with OT, and we are trying to work without the RW at this time) Required Braces or Orthoses: Sling Restrictions LUE Weight Bearing: Non weight bearing    Mobility  Bed  Mobility Overal bed mobility: Independent Bed Mobility: Supine to Sit     Supine to sit: Mod assist;+2 for safety/equipment     General bed mobility comments: Mod assist and use of bed pad to square off hips at EOB; attempted to stand impulsively upone coming to sit at EOB  Transfers Overall transfer level: Needs assistance Equipment used: 2 person hand held assist Transfers: Sit to/from Stand Sit to Stand: Mod assist;Min assist;+2 safety/equipment         General transfer comment: Stood from bed with mod assist due to posterior lean;  Improved with tactile and some verbal cueing; stood from 3in1 in bathroom with use of grab bar on R with RUE and min assist  Ambulation/Gait Ambulation/Gait assistance: Min assist;Mod assist Gait Distance (Feet): 30 Feet Assistive device: 2 person hand held assist (R handheld assist; L support at gait belt) Gait Pattern/deviations: Step-through pattern;Decreased step length - right;Decreased step length - left     General Gait Details: Better, wider step width than yesterday's session; occasional mod assist for balance; performed amb well with cues for goal (bathroom)   Stairs             Wheelchair Mobility    Modified Rankin (Stroke Patients Only)       Balance     Sitting balance-Leahy Scale:  (Approaching Fair)       Standing balance-Leahy Scale: Poor                              Cognition Arousal/Alertness: Awake/alert Behavior During Therapy: Flat affect Overall Cognitive Status: Impaired/Different from baseline  General Comments: notably slightly improved from yesterday's session, more awake; participated more in session, asked, "what do I do now?" a few times      Exercises      General Comments General comments (skin integrity, edema, etc.): Adjusted sling for proper fit once sitting up on EOB; Session conducted on Room Air and O2 sats stayed in the  90s; passed gas on the commode, but no BM      Pertinent Vitals/Pain Pain Assessment: Faces Faces Pain Scale: Hurts little more Pain Location: did not identify shouler pain when asked; Seems his foley catheter is causing discomfort Pain Descriptors / Indicators: Grimacing Pain Intervention(s): Monitored during session    Home Living                      Prior Function            PT Goals (current goals can now be found in the care plan section) Acute Rehab PT Goals Patient Stated Goal: per family to bring him home with assistance PT Goal Formulation: With family Time For Goal Achievement: 05-30-20 Potential to Achieve Goals: Fair Progress towards PT goals: Progressing toward goals    Frequency    Min 3X/week      PT Plan Current plan remains appropriate    Co-evaluation              AM-PAC PT "6 Clicks" Mobility   Outcome Measure  Help needed turning from your back to your side while in a flat bed without using bedrails?: A Lot Help needed moving from lying on your back to sitting on the side of a flat bed without using bedrails?: A Lot Help needed moving to and from a bed to a chair (including a wheelchair)?: A Lot Help needed standing up from a chair using your arms (e.g., wheelchair or bedside chair)?: A Lot Help needed to walk in hospital room?: A Lot Help needed climbing 3-5 steps with a railing? : Total 6 Click Score: 11    End of Session Equipment Utilized During Treatment: Gait belt (Sling) Activity Tolerance: Patient tolerated treatment well Patient left: in chair;with family/visitor present;Other (comment) (with OT preparing to start her session) Nurse Communication: Mobility status PT Visit Diagnosis: Other abnormalities of gait and mobility (R26.89);Other symptoms and signs involving the nervous system (R29.898)     Time: 1100-1133 PT Time Calculation (min) (ACUTE ONLY): 33 min  Charges:  $Gait Training: 8-22 mins $Therapeutic  Activity: 8-22 mins                     Roney Marion, PT  Acute Rehabilitation Services Pager [phone] Office [place] 05/13/2020, 3:18 PM

## 2020-05-13 NOTE — Progress Notes (Signed)
EEG complete - results pending 

## 2020-05-13 NOTE — Progress Notes (Signed)
Inpatient Rehab Admissions:  Inpatient Rehab Consult received.  I met with patient and his wife at the bedside for rehabilitation assessment and to discuss goals and expectations of an inpatient rehab admission.  Pt sleeping throughout most of our discussion and does not add anything.  Wife notes pt on CIR in Dec 2019 and would like to pursue another admission, as long as Medicare will cover stay.  We discussed that prior Josem Kaufmann is not typically required for Medicare A/B, but I will double check with MDs regarding medical necessity.  Pt certainly is not at his functional baseline and could benefit from rehab.  I will f/u with pt/family tomorrow.   Signed: Shann Medal, PT, DPT Admissions Coordinator [phone] 05/13/20  3:25 PM

## 2020-05-13 NOTE — Progress Notes (Addendum)
Occupational Therapy Treatment Patient Details Name: Eric George MRN: [id number] DOB: [birth year] Today's Date: 05/13/2020    History of present illness 87 y.o. male with TBI 1990, stroke 2013, HTN, HLD, prostate cancer, GERD, SBO, short-term memory loss presented to [facility] Emergency Department via EMS after having a seizure. He was also found to have a left shoulder fracture dislocation. Underwent L Reverse TSA 1/8. Acute metabolic encephalopahty/delirium and disorientation requiring medicaiton due to agitation.   OT comments  Pt up in recliner upon arrival from PT session, pt's wife and son present. Pt making progress with functional goals. Pt, wife and son educated on compensatory ADL techniques, sling donning/doffing, L UE positioning, NO ROM of L shoulder but L elbow/wrist/digit ROM ok (shoulder handout provided). OT will continue to follow acutely to maximize level of function and safety  Follow Up Recommendations  CIR;Supervision/Assistance - 24 hour    Equipment Recommendations  3 in 1 bedside commode    Recommendations for Other Services      Precautions / Restrictions Precautions Precautions: Fall;Shoulder Type of Shoulder Precautions: no ROM shoulder; elbow/wrist/digits ROM OK Shoulder Interventions: Shoulder sling/immobilizer;At all times;Off for dressing/bathing/exercises Precaution Comments: Per Ortho note on 1/10: he can use the LUE to use a RW Required Braces or Orthoses: Sling Restrictions Weight Bearing Restrictions: Yes LUE Weight Bearing: Non weight bearing       Mobility Bed Mobility Overal bed mobility: Independent Bed Mobility: Supine to Sit     Supine to sit: Mod assist;+2 for safety/equipment     General bed mobility comments: pt in recliner upon arrival  Transfers Overall transfer level: Needs assistance Equipment used: 2 person hand held assist Transfers: Sit to/from Stand Sit to Stand: Mod assist;Min assist;+2 safety/equipment Stand  pivot transfers: Mod assist;+2 physical assistance       General transfer comment: Stood from bed with mod assist due to posterior lean;  Improved with tactile and some verbal cueing; stood from 3in1 in bathroom with use of grab bar on R with RUE and min assist    Balance Overall balance assessment: Needs assistance;History of Falls   Sitting balance-Leahy Scale: Fair Sitting balance - Comments: posterior lean   Standing balance support: Bilateral upper extremity supported Standing balance-Leahy Scale: Poor Standing balance comment: posterior lean                           ADL either performed or assessed with clinical judgement   ADL Overall ADL's : Needs assistance/impaired Eating/Feeding: Set up;Supervision/ safety;Sitting   Grooming: Brushing hair;Set up;Supervision/safety;Sitting;Wash/dry face;With caregiver independent assisting   Upper Body Bathing: Moderate assistance;Sitting;With caregiver independent assisting   Lower Body Bathing: Moderate assistance;Sitting/lateral leans;With caregiver independent assisting   Upper Body Dressing : Moderate assistance;Sitting;With caregiver independent assisting       Toilet Transfer: Moderate assistance;+2 for physical assistance;Stand-pivot;With caregiver independent assisting   Toileting- Clothing Manipulation and Hygiene: Total assistance       Functional mobility during ADLs: Moderate assistance;+2 for physical assistance General ADL Comments: Pt, his wife and son educated on compensatory bathing and dressing techniques; handout provided     Vision Baseline Vision/History: Wears glasses Wears Glasses: At all times Patient Visual Report: No change from baseline     Perception     Praxis      Cognition Arousal/Alertness: Awake/alert Behavior During Therapy: Flat affect Overall Cognitive Status: Impaired/Different from baseline Area of Impairment: Orientation;Attention;Memory;Following  commands;Safety/judgement;Awareness;Problem solving  Following Commands: Follows one step commands with increased time       General Comments: notably slightly improved from yesterday's session, more awake; participated more in session, asked, "what do I do now?" a few times        Exercises  ROM L elbow, wrist and digits to tolerance   Shoulder Instructions Shoulder Instructions Donning/doffing shirt without moving shoulder: Moderate assistance;Caregiver independent with task Method for sponge bathing under operated UE: Moderate assistance;Caregiver independent with task Donning/doffing sling/immobilizer: Caregiver independent with task Correct positioning of sling/immobilizer: Supervision/safety;Caregiver independent with task ROM for elbow, wrist and digits of operated UE: Caregiver independent with task;Minimal assistance Sling wearing schedule (on at all times/off for ADL's): Supervision/safety;Caregiver independent with task Proper positioning of operated UE when showering: Supervision/safety;Caregiver independent with task Positioning of UE while sleeping: Supervision/safety;Caregiver independent with task     General Comments Adjusted sling for proper fit once sitting up on EOB; Session conducted on Room Air and O2 sats stayed in the 90s; passed gas on the commode, but no BM    Pertinent Vitals/ Pain       Pain Assessment: Faces Faces Pain Scale: Hurts little more Pain Location: shoulder, foley Pain Descriptors / Indicators: Grimacing;Discomfort;Restless Pain Intervention(s): Monitored during session;Repositioned  Home Living                                          Prior Functioning/Environment              Frequency  Min 3X/week        Progress Toward Goals  OT Goals(current goals can now be found in the care plan section)     Acute Rehab OT Goals Patient Stated Goal: per family to bring him home with  assistance  Plan Discharge plan remains appropriate    Co-evaluation                 AM-PAC OT "6 Clicks" Daily Activity     Outcome Measure   Help from another person eating meals?: None Help from another person taking care of personal grooming?: A Little Help from another person toileting, which includes using toliet, bedpan, or urinal?: A Lot Help from another person bathing (including washing, rinsing, drying)?: A Lot Help from another person to put on and taking off regular upper body clothing?: A Lot Help from another person to put on and taking off regular lower body clothing?: A Lot 6 Click Score: 15    End of Session Equipment Utilized During Treatment: Gait belt;Other (comment) (L UE sling)  OT Visit Diagnosis: Unsteadiness on feet (R26.81);Other abnormalities of gait and mobility (R26.89);Muscle weakness (generalized) (M62.81);History of falling (Z91.81);Other symptoms and signs involving cognitive function;Pain Pain - Right/Left: Left Pain - part of body: Shoulder   Activity Tolerance Patient limited by fatigue   Patient Left in chair;with call bell/phone within reach;with chair alarm set;with family/visitor present   Nurse Communication          Time: 1126-1202 OT Time Calculation (min): 36 min  Charges: OT General Charges $OT Visit: 1 Visit OT Treatments $Self Care/Home Management : 8-22 mins $Therapeutic Activity: 8-22 mins     Britt Bottom 05/13/2020, 3:31 PM

## 2020-05-13 NOTE — Plan of Care (Signed)
  Problem: Health Behavior/Discharge Planning: °Goal: Ability to manage health-related needs will improve °Outcome: Progressing °  °Problem: Clinical Measurements: °Goal: Respiratory complications will improve °Outcome: Progressing °  °Problem: Safety: °Goal: Ability to remain free from injury will improve °Outcome: Progressing °  °

## 2020-05-13 NOTE — Progress Notes (Signed)
Patient able to ambulate to the bathroom with 2x assist, per PT.  Foley catheter removed, will continue to monitor for urinary output

## 2020-05-13 NOTE — Plan of Care (Signed)
  Problem: Activity: Goal: Risk for activity intolerance will decrease Outcome: Progressing   Problem: Nutrition: Goal: Adequate nutrition will be maintained Outcome: Progressing   Problem: Elimination: Goal: Will not experience complications related to bowel motility Outcome: Progressing   
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
## 2020-05-13 NOTE — Progress Notes (Signed)
Patient has been resting in bed since EEG this afternoon. Patient has not had much oral intake since test. No urine output, bladder scan 135mL, patient does not report urge to urinate. Encouraged family to increase PO fluid intake.  Will continue to monitor

## 2020-05-13 NOTE — Telephone Encounter (Signed)
Form done and faxed.  Left message informing family.  Copy sent to scan and copy saved in drawer.

## 2020-05-13 NOTE — Progress Notes (Signed)
   ORTHOPAEDIC PROGRESS NOTE  s/p Procedure(s): REVERSE SHOULDER ARTHROPLASTY  SUBJECTIVE: Son and wife at bedside. Last night was easier than the night before from an agitation standpoint. He has not complained of pain in his left shoulder. Therapy has recommended CIR. Family is hoping for this.   OBJECTIVE: PE: General: resting comfortably in hospital bed, NAD Left shoulder: dressing CDI. ROM not tested. Endorses axillary nerve sensation. Wiggles his fingers. Endorses distal sensation. Warm well perfused hand.   Vitals:   05/12/20 1540 05/12/20 2117  BP: 125/82 (!) 161/92  Pulse: 97 81  Resp: 18   Temp: 98.8 F (37.1 C) 98.5 F (36.9 C)  SpO2: 94% 96%     ASSESSMENT: Eric George is a 87 y.o. male POD#2  PLAN: Weightbearing: NWB LUE   - okay to use arm for activities of daily living including feeding but minimizing shoulder motion.   - Okay for passive and active range of motion of the elbow and wrist keeping the hands in front of the abdomen and chest would be reasonable while sitting and supervised  - can use left arm when using his walker    - sling should be on at all times except for exercises, certain ADLs, and when using walker Insicional and dressing care: Reinforce dressings as needed Orthopedic device(s): None Showering: Post-op day #2 VTE prophylaxis: Lovenox 40mg  qd while in the hospital will transition to aspirin outpatient Pain control: PRN pain medications, preferring oral medications. Minimize narcotics to avoid worsening delirium Post-op delirium: per medicine "Resume sertraline 100mg  in the morning. Haldol 2 mg IV every 6 hours as needed Will avoid using Ativan, however reserved for severe agitation and difficult to control behavior." Follow - up plan: 1 week in office with Dr. Griffin Basil for repeat x-rays Dispo: TBD. PT/OT recommending. Will request TOC consult. Patient's wife would like to speak to [facility] in person.   Contact information:  Dr. Ophelia Charter, Noemi Chapel PA-C, After hours and holidays please check [email or web address] for group call information for [facility]  Judyann Munson 05/13/2020

## 2020-05-13 NOTE — TOC Initial Note (Signed)
Transition of Care [facility]) - Initial/Assessment Note    Patient Details  Name: Eric George MRN: [id number] Date of Birth: 07-Feb-1931  Transition [facility]) CM/SW Contact:    Sharin Mons, RN Phone Number: 05/13/2020, 3:52 PM  Clinical Narrative:                 Admitted with seizure , L shoulder fx. Hx of TBI 1990, stroke 2013, HTN, HLD, prostate cancer, GERD, SBO, short-term memory loss. From home with wife. Wife states PTA independent. Pt owns walker and cane.    - s/p  L Reverse TSA 1/8  NCM spoke with pt's wife @ bedside in regard to d/c planning. Wife hoping pt transition will transition to CIR.  Rehab MD consult pending....   TOC team will continue to monitor and assist with TOC needs....  Expected Discharge Plan: [place] (vs SNF vs [name]) Barriers to Discharge: Continued Medical Work up   Patient Goals and CMS Choice     Choice offered to / list presented to : Spouse  Expected Discharge Plan and Services Expected Discharge Plan: [place] (vs SNF vs [facility])                                              Prior Living Arrangements/Services                       Activities of Daily Living Home Assistive Devices/Equipment: Engineer, drilling (specify type) ADL Screening (condition at time of admission) Patient's cognitive ability adequate to safely complete daily activities?: No Is the patient deaf or have difficulty hearing?: No Does the patient have difficulty seeing, even when wearing glasses/contacts?: No Does the patient have difficulty concentrating, remembering, or making decisions?: Yes Patient able to express need for assistance with ADLs?: No Does the patient have difficulty dressing or bathing?: Yes Independently performs ADLs?: No Communication: Independent Dressing (OT): Independent Is this a change from baseline?: Pre-admission baseline Grooming: Independent Is this a change from baseline?: Pre-admission  baseline Feeding: Independent Bathing: Independent Is this a change from baseline?: Pre-admission baseline Toileting: Independent In/Out Bed: Independent Walks in Home: Independent Does the patient have difficulty walking or climbing stairs?: Yes Weakness of Legs: Both Weakness of Arms/Hands: None  Permission Sought/Granted                  Emotional Assessment              Admission diagnosis:  Seizure ([place]) [R56.9] Pain [R52] New onset seizure ([place]) [R56.9] Dislocation of right shoulder joint, initial encounter [S43.004A] Patient Active Problem List   Diagnosis Date Noted  . New onset seizure ([place]) 05/11/2020  . Anterior dislocation of left shoulder 05/11/2020  . Acute metabolic encephalopathy 13/12/6576  . Ventricular bigeminy 04/26/2019  . Acute lower UTI   . Prediabetes   . Gait disorder 04/28/2018  . Acute encephalopathy 04/26/2018  . Dupuytren's contracture 10/30/2017  . Frequent falls 10/30/2017  . History of closed head injury 07/25/2017  . Osteoarthritis, multiple sites 07/08/2016  . Risk for falls 07/08/2016  . Premature atrial contractions 05/11/2015  . Sepsis ([place]) 04/29/2015  . History of small bowel obstruction 04/29/2015  . History of traumatic brain injury 04/29/2015  . Acute respiratory failure ([place]) 04/29/2015  . CAP (community acquired pneumonia)   . 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 Aphasia 08/25/2012  . Macular degeneration 04/25/2012  . CVA (cerebral infarction) 03/20/2012  . Small bowel obstruction ([place]) 09/12/2011  . Nausea & vomiting 09/12/2011  . Memory loss 04/01/2010  . CARCINOMA, SKIN, SQUAMOUS CELL 10/28/2009  . SKIN LESION 10/24/2009  . NEOPLASM, SKIN, UNCERTAIN BEHAVIOR 10/62/6948  . DYSGEUSIA 10/10/2009  . Hyperlipidemia 07/20/2008  . DEPRESSION 07/20/2008  . Essential hypertension 07/20/2008  . RHINITIS 07/20/2008  . COLONIC POLYPS, HX OF 07/20/2008  . GERD  (gastroesophageal reflux disease) 07/18/2008  . PROSTATE CANCER, HX OF 07/18/2008   PCP:  Eulas Post, MD Pharmacy:   [facility] [address] [place], [place]. [place] [address] Phone: [phone] Fax: [phone]  [id number] [place], [place] [address] Phone: [phone] Fax: [phone]  [facility] [place] [place] [facility] [place] [address] Phone: [phone] Fax: [phone]     Social Determinants of Health (SDOH) Interventions    Readmission Risk Interventions No flowsheet data found.

## 2020-05-14 ENCOUNTER — Encounter (HOSPITAL_COMMUNITY): Payer: Self-pay | Admitting: Orthopaedic Surgery

## 2020-05-14 DIAGNOSIS — S43015A Anterior dislocation of left humerus, initial encounter: Secondary | ICD-10-CM | POA: Diagnosis not present

## 2020-05-14 NOTE — Progress Notes (Signed)
Inpatient Rehab Admissions Coordinator:   Discussed pt with Dr. Naaman Plummer this AM regarding medical necessity for this patient.  He does not feel that the patient has a strong rehab diagnosis, and there is a risk that Medicare would not cover an inpatient rehab stay.  Since Medicare does not require prior authorization for rehab services, the pt/family would be taking a risk of being billed for rehab services if they elected to pursue admission.  I spoke to pt's spouse over the phone to provide her with an update.  She does not feel they can afford to risk being billed for a CIR stay (as they were during pt's previous admission), and does not desire d/c to SNF level rehab for patient.  I let her know that acute care transitions of care team would follow up with her for next steps and to help arrange next level of care.  CIR will sign off at this time.  Please feel free to contact me with questions.    Shann Medal, PT, DPT Admissions Coordinator [phone] 05/14/20  2:50 PM

## 2020-05-14 NOTE — Progress Notes (Signed)
   ORTHOPAEDIC PROGRESS NOTE  s/p Procedure(s): REVERSE SHOULDER ARTHROPLASTY  SUBJECTIVE: Wife at bedside. Each night getting less agitated. Sling was irritating to him this morning. He has tried to get his arm out of the sling. He has not complained of much pain in his left shoulder. Therapy has recommended CIR. Family is hoping for this. Wife is nervous about nursing home because of COVID and his short term memory loss.   OBJECTIVE: PE: General: resting comfortably in hospital bed, NAD Left shoulder: dressing CDI. ROM not tested. Endorses axillary nerve sensation. Wiggles his fingers. Endorses distal sensation. Warm well perfused hand.   Vitals:   05/13/20 2235 05/14/20 0633  BP: (!) 157/88   Pulse: 93   Resp: 16   Temp: 98.3 F (36.8 C) 98.5 F (36.9 C)  SpO2: 95%      ASSESSMENT: Eric George is a 87 y.o. male POD#3  PLAN: Weightbearing: NWB LUE   - okay to use arm for activities of daily living including feeding but minimizing shoulder motion.   - Okay for passive and active range of motion of the elbow and wrist keeping the hands in front of the abdomen and chest would be reasonable while sitting and supervised  - can use left arm when using his walker    - sling should be on at all times except for exercises, certain ADLs, and when using walker Insicional and dressing care: Reinforce dressings as needed Orthopedic device(s): None Showering: Post-op day #2 VTE prophylaxis: Lovenox 40mg  qd while in the hospital will transition to aspirin outpatient Pain control: PRN pain medications, preferring oral medications. Minimize narcotics to avoid worsening delirium Post-op delirium: per medicine "Resume sertraline 100mg  in the morning. Haldol 2 mg IV every 6 hours as needed Will avoid using Ativan, however reserved for severe agitation and difficult to control behavior." Follow - up plan: 1 week in office with Dr. Griffin Basil for repeat x-rays Dispo: TBD. PT/OT recommending  CIR. Rehab consult pending. TOC following.   Contact information:  Dr. Ophelia Charter, Noemi Chapel PA-C, After hours and holidays please check [email or web address] for group call information for [facility]  Judyann Munson 05/14/2020

## 2020-05-14 NOTE — Plan of Care (Signed)
  Problem: Activity: Goal: Risk for activity intolerance will decrease Outcome: Progressing   Problem: Nutrition: Goal: Adequate nutrition will be maintained Outcome: Progressing   Problem: Coping: Goal: Level of anxiety will decrease Outcome: Progressing   Problem: Elimination: Goal: Will not experience complications related to bowel motility Outcome: Progressing   
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
## 2020-05-14 NOTE — Plan of Care (Signed)
  Problem: Health Behavior/Discharge Planning: Goal: Ability to manage health-related needs will improve Outcome: Progressing   Problem: Clinical Measurements: Goal: Respiratory complications will improve Outcome: Progressing   Problem: Elimination: Goal: Will not experience complications related to bowel motility Outcome: Progressing   Problem: Pain Managment: Goal: General experience of comfort will improve Outcome: Progressing   Problem: Safety: Goal: Ability to remain free from injury will improve Outcome: Progressing

## 2020-05-14 NOTE — Progress Notes (Signed)
1400: Patient attempted to walk to restroom upon, standing pt reported dizziness, assisted patient back to chair, encouraged pt to drink more fluids.  1420: Returned to patients room to assess blood pressure BP Sitting: 113/66  BP Standing: 88/57  Patient assisted back to bed, BP Lying: 116/76.  MD made aware, encouraged to stand pt at side of bed more frequently   1700: Patient requesting to walk to the bathroom, patient assisted to the bathroom with 2 assist, was able to have a BM. No reports of dizziness or lightheadedness.

## 2020-05-14 NOTE — Plan of Care (Signed)
  Problem: Activity: Goal: Risk for activity intolerance will decrease Outcome: Progressing   Problem: Nutrition: Goal: Adequate nutrition will be maintained Outcome: Progressing   Problem: Elimination: Goal: Will not experience complications related to bowel motility Outcome: Progressing Goal: Will not experience complications related to urinary retention Outcome: Progressing   

## 2020-05-14 NOTE — Progress Notes (Signed)
Physical Therapy Treatment Patient Details Name: Eric George MRN: [id number] DOB: 1930/08/22 Today's Date: 05/14/2020    History of Present Illness 87 y.o. male with TBI 1990, stroke 2013, HTN, HLD, prostate cancer, GERD, SBO, short-term memory loss presented to [facility] Emergency Department via EMS after having a seizure. He was also found to have a left shoulder fracture dislocation. Underwent L Reverse TSA 1/8. Acute metabolic encephalopahty/delirium and disorientation requiring medicaiton due to agitation.    PT Comments    Continuing work on functional mobility and activity tolerance;  Session focused on continuing functional ambulation and balance assessment; Needed min assist to steady with standing, and min/mod assist with walking to the bathroom; Continuing improvements in cognition and participation, which is encouraging for prognosis and outcomes;   Notable episode of presyncope standing to get washed up in the bathroom; Pt was able to identify that he didn't feel well, and that he needed to sit down; Unable to get a BP in standing at that time, but sitting BP was 113/66; Noted later in the day his nurse, R. Watlington, RN obtained a stnding BP that was 88/57, so I believe his presyncopal episode was brought about by orthostatic hypotension;   Noted [facility] is unable to accept Eric George; While post-acute rehab at SNF is appropriate for pt, it is hgihly likely that family will refuse; Will need to maximize [facility] services for Eric George and his family  Follow Up Recommendations  SNF;Supervision/Assistance - 24 hour;Other (comment) (While post-acute rehab is appropriate for pt and his family, I anticipate they will decline SNF; Must consider [facility], [place], [facility] -- and Eric George has been active with [facility])     Equipment Recommendations  Wheelchair (measurements PT);Wheelchair cushion (measurements PT);Hospital bed (Will consider hemiwalker)     Recommendations for Other Services Other (comment) [facility] Liaison for maximizing resources and support)     Precautions / Restrictions Precautions Precautions: Fall;Shoulder Type of Shoulder Precautions: no ROM shoulder; elbow/wrist/digits ROM OK Shoulder Interventions: Shoulder sling/immobilizer;At all times;Off for dressing/bathing/exercises Precaution Booklet Issued: No Precaution Comments: Per Ortho note on 1/10: he can use the LUE to use a RW Required Braces or Orthoses: Sling Restrictions LUE Weight Bearing: Non weight bearing (While use of RW was OK'd in Ortho note on 1/10, PT and OT are hesitant to use it)    Mobility  Bed Mobility Overal bed mobility: Needs Assistance Bed Mobility: Supine to Sit     Supine to sit: Min assist     General bed mobility comments: Min assist and cues to roll to Right and push up from R sidelying to sit  Transfers Overall transfer level: Needs assistance Equipment used: 1 person hand held assist (with second person present for safety) Transfers: Sit to/from Stand Sit to Stand: Min assist;+2 safety/equipment         General transfer comment: Improved anterior weight shift to initiate sit to stand; min handeld assist to steady  Ambulation/Gait Ambulation/Gait assistance: Min assist;Mod assist Gait Distance (Feet): 30 Feet Assistive device: 1 person hand held assist (2nd person present for safety) Gait Pattern/deviations: Step-through pattern;Narrow base of support     General Gait Details: Cues to widen step width; Handheld assist to steady   Stairs             Wheelchair Mobility    Modified Rankin (Stroke Patients Only)       Balance     Sitting balance-Leahy Scale: Fair  Standing balance-Leahy Scale: Fair Standing balance comment: little to no psoterior lean standing from toilet; noted presyncope while standing to wash up                            Cognition Arousal/Alertness:  Awake/alert Behavior During Therapy: Flat affect (Occasional smile) Overall Cognitive Status: Impaired/Different from baseline                                 General Comments: Noting continuing improvements in cognition; Per wife, he 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 Elpers; rounded the corners of the velcro tabs for better comfort      Pertinent Vitals/Pain Pain Assessment: Faces Faces Pain Scale: Hurts a little bit Pain Location: shoulder, foley Pain Descriptors / Indicators: Grimacing Pain Intervention(s): Monitored during session    Home Living                      Prior Function            PT Goals (current goals can now be found in the care plan section) Acute Rehab PT Goals Patient Stated Goal: per family to bring him home with assistance PT Goal Formulation: With family Time For Goal Achievement: 2020/06/04 Potential to Achieve Goals: Fair Progress towards PT goals: Progressing toward goals    Frequency    Min 3X/week      PT Plan Current plan remains appropriate;Discharge plan needs to be updated;Other (comment) ([facility] unable to take Eric George)    Co-evaluation              AM-PAC PT "6 Clicks" Mobility   Outcome Measure  Help needed turning from your back to your side while in a flat bed without using bedrails?: A Little Help needed moving from lying on your back to sitting on the side of a flat bed without using bedrails?: A Little Help needed moving to and from a bed to a chair (including a wheelchair)?: A Lot Help needed standing up from a chair using your arms (e.g., wheelchair or bedside chair)?: A Lot Help needed to walk in hospital room?: A Lot Help needed climbing 3-5  steps with a railing? : Total 6 Click Score: 13    End of Session Equipment Utilized During Treatment: Gait belt (Sling) Activity Tolerance: Patient tolerated treatment well;Other (comment) (though likely had an episode of orthostatic hypotension) Patient left: in chair;with call bell/phone within reach;with family/visitor present;with nursing/sitter in room Nurse Communication: Mobility status;Other (comment) (and likely BP drop in standing) PT Visit Diagnosis: Other abnormalities of gait and mobility (R26.89);Other symptoms and signs involving the nervous system (R29.898)     Time: 8841-6606 PT Time Calculation (min) (ACUTE ONLY): 30 min  Charges:  $Gait Training: 8-22 mins $Therapeutic Activity: 8-22 mins                     Roney Marion, PT  Acute Rehabilitation Services Pager [phone] Office [place] 05/14/2020, 5:20 PM

## 2020-05-15 DIAGNOSIS — S43015A Anterior dislocation of left humerus, initial encounter: Secondary | ICD-10-CM | POA: Diagnosis not present

## 2020-05-15 DIAGNOSIS — Z87820 Personal history of traumatic brain injury: Secondary | ICD-10-CM | POA: Diagnosis not present

## 2020-05-15 LAB — GLUCOSE, CAPILLARY
Glucose-Capillary: 117 mg/dL — ABNORMAL HIGH (ref 70–99)
Glucose-Capillary: 119 mg/dL — ABNORMAL HIGH (ref 70–99)

## 2020-05-15 MED ORDER — CELECOXIB 100 MG PO CAPS: 100.0000 mg | ORAL_CAPSULE | Freq: Two times a day (BID) | ORAL | 0 refills | Status: DC

## 2020-05-15 MED ORDER — TRAMADOL HCL 50 MG PO TABS: 50.0000 mg | ORAL_TABLET | Freq: Four times a day (QID) | ORAL | 0 refills | Status: DC | PRN

## 2020-05-15 MED ORDER — ACETAMINOPHEN 500 MG PO TABS: 1000.0000 mg | ORAL_TABLET | Freq: Three times a day (TID) | ORAL | 0 refills | Status: DC

## 2020-05-15 MED ORDER — ASPIRIN 81 MG PO CHEW: 81.0000 mg | CHEWABLE_TABLET | Freq: Two times a day (BID) | ORAL | 0 refills | Status: DC

## 2020-05-15 NOTE — Progress Notes (Signed)
Occupational Therapy Treatment Patient Details Name: Eric George MRN: [id number] DOB: [birth year] Today's Date: 05/15/2020    History of present illness 87 y.o. male with TBI 1990, stroke 2013, HTN, HLD, prostate cancer, GERD, SBO, short-term memory loss presented to [facility] Emergency Department via EMS after having a seizure. He was also found to have a left shoulder fracture dislocation. Underwent L Reverse TSA 1/8. Acute metabolic encephalopahty/delirium and disorientation requiring medicaiton due to agitation.   OT comments  Pt making good progress with functional goals. OT will continue to follow acutely to maximize level of function ans safety  Follow Up Recommendations  Home health OT (CIR denied pt)    Equipment Recommendations  3 in 1 bedside commode   Recommendations for Other Services      Precautions / Restrictions Precautions Precautions: Fall;Shoulder Type of Shoulder Precautions: no ROM shoulder; elbow/wrist/digits ROM OK Shoulder Interventions: Shoulder sling/immobilizer;At all times;Off for dressing/bathing/exercises Precaution Comments: Per Ortho note on 1/10: he can use the LUE to use a RW Required Braces or Orthoses: Sling Restrictions Weight Bearing Restrictions: Yes LUE Weight Bearing: Non weight bearing       Mobility Bed Mobility               General bed mobility comments: pt at EOB with PT upon arrival  Transfers Overall transfer level: Needs assistance Equipment used: 1 person hand held assist;Rolling walker (2 wheeled) Transfers: Sit to/from Stand Sit to Stand: Min assist Stand pivot transfers: Min guard       General transfer comment: cues for safety, R UE NWB adherence    Balance Overall balance assessment: Needs assistance;History of Falls   Sitting balance-Leahy Scale: Fair Sitting balance - Comments: posterior lean   Standing balance support: Bilateral upper extremity supported Standing balance-Leahy Scale: Fair                              ADL either performed or assessed with clinical judgement   ADL Overall ADL's : Needs assistance/impaired     Grooming: Wash/dry face;With caregiver independent assisting;Wash/dry hands;Min guard;Standing   Upper Body Bathing: Sitting;With caregiver independent assisting;Minimal assistance       Upper Body Dressing : Sitting;With caregiver independent assisting;Minimal assistance       Toilet Transfer: With caregiver independent assisting;Minimal assistance;Min guard;Ambulation;RW;Comfort height toilet;Regular Toilet   Toileting- Clothing Manipulation and Hygiene: Moderate assistance;Sit to/from stand         General ADL Comments: reviewed education on donning and doffing sling     Vision Baseline Vision/History: Wears glasses Patient Visual Report: No change from baseline     Perception     Praxis      Cognition Arousal/Alertness: Awake/alert Behavior During Therapy: Flat affect Overall Cognitive Status: Impaired/Different from baseline Area of Impairment: Safety/judgement;Awareness;Problem solving;Following commands;Memory                       Following Commands: Follows one step commands with increased time Safety/Judgement: Decreased awareness of safety;Decreased awareness of deficits     General Comments: pt with improved cognition this session        Exercises     Shoulder Instructions       General Comments      Pertinent Vitals/ Pain       Pain Assessment: Faces Faces Pain Scale: Hurts a little bit Pain Location: shoulder Pain Descriptors / Indicators: Grimacing Pain Intervention(s): Monitored during session;Repositioned  Home  Living                                          Prior Functioning/Environment              Frequency  Min 3X/week        Progress Toward Goals  OT Goals(current goals can now be found in the care plan section)  Progress towards OT goals:  Progressing toward goals     Plan Discharge plan remains appropriate    Co-evaluation      Reason for Co-Treatment: Complexity of the patient's impairments (multi-system involvement);For patient/therapist safety;To address functional/ADL transfers   OT goals addressed during session: ADL's and self-care;Proper use of Adaptive equipment and DME      AM-PAC OT "6 Clicks" Daily Activity     Outcome Measure   Help from another person eating meals?: None Help from another person taking care of personal grooming?: A Little Help from another person toileting, which includes using toliet, bedpan, or urinal?: A Lot Help from another person bathing (including washing, rinsing, drying)?: A Lot Help from another person to put on and taking off regular upper body clothing?: A Little Help from another person to put on and taking off regular lower body clothing?: A Lot 6 Click Score: 16    End of Session Equipment Utilized During Treatment: Gait belt;Other (comment) (RW)  OT Visit Diagnosis: Unsteadiness on feet (R26.81);Other abnormalities of gait and mobility (R26.89);Muscle weakness (generalized) (M62.81);History of falling (Z91.81);Other symptoms and signs involving cognitive function;Pain Pain - Right/Left: Left Pain - part of body: Shoulder   Activity Tolerance Patient limited by fatigue   Patient Left in chair;with call bell/phone within reach;with chair alarm set;with family/visitor present   Nurse Communication          Time: 3893-7342 OT Time Calculation (min): 17 min  Charges: OT General Charges $OT Visit: 1 Visit OT Treatments $Self Care/Home Management : 8-22 mins     Britt Bottom 05/15/2020, 3:00 PM

## 2020-05-15 NOTE — Plan of Care (Signed)
  Problem: Clinical Measurements: Goal: Respiratory complications will improve Outcome: Progressing   Problem: Activity: Goal: Risk for activity intolerance will decrease Outcome: Progressing   Problem: Pain Managment: Goal: General experience of comfort will improve Outcome: Progressing   Problem: Safety: Goal: Ability to remain free from injury will improve Outcome: Progressing

## 2020-05-15 NOTE — Progress Notes (Signed)
    Durable Medical Equipment  (From admission, onward)         Start     Ordered   05/15/20 1544  For home use only DME 3 n 1  Once        05/15/20 1543   05/15/20 1457  For home use only DME lightweight manual wheelchair with seat cushion  Once       Comments: Patient suffers from weakness, L shoulder fx which impairs their ability to perform daily activities like walking in the home.  A rolling walker will not resolve  issue with performing activities of daily living. A wheelchair will allow patient to safely perform daily activities. Patient is not able to propel themselves in the home using a standard weight wheelchair due to weakness. Patient's family to help assist propel in the lightweight wheelchair. Length of need 12 month. Accessories: elevating leg rests (ELRs), wheel locks, extensions and anti-tippers.   05/15/20 1502
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
## 2020-05-15 NOTE — Progress Notes (Signed)
Physical Therapy Treatment Patient Details Name: Eric George MRN: [id number] DOB: 1930-10-25 Today's Date: 05/15/2020    History of Present Illness 87 y.o. male with TBI 1990, stroke 2013, HTN, HLD, prostate cancer, GERD, SBO, short-term memory loss presented to [facility] Emergency Department via EMS after having a seizure. He was also found to have a left shoulder fracture dislocation. Underwent L Reverse TSA 1/8. Acute metabolic encephalopahty/delirium and disorientation requiring medicaiton due to agitation.    PT Comments    Continuing work on functional mobility and activity tolerance;  Noted official word that CIR cannot take Mr. Gilliand; session focused on progressive ambulation and taking a closer look at Orthostatic BPs (see general comments below re: serial BPs); OPted to use RW (ok per Ortho note) in light of dc plan changing to dc home;  For short distance, pt maintained LUE lightly on RW well; with longer distance (approx 50 ft) noted he did start to put weihgt through the L hand as he fatigued; Worth considering wheelchair for home (appreciate TOC)  Follow Up Recommendations  SNF;Supervision/Assistance - 24 hour;Other (comment) (While post-acute rehab is appropriate for pt and his family, I anticipate they will decline SNF; Must consider HHtherapies, [facility], [facility] -- and Mr. Tupy has been active with [facility])     Equipment Recommendations  Wheelchair (measurements PT);Wheelchair cushion (measurements PT);Hospital bed (Will consider hemiwalker)    Recommendations for Other Services Other (comment) [facility] Liaison for maximizing resources and support)     Precautions / Restrictions Precautions Precautions: Fall;Shoulder Type of Shoulder Precautions: no ROM shoulder; elbow/wrist/digits ROM OK Shoulder Interventions: Shoulder sling/immobilizer;At all times;Off for dressing/bathing/exercises Precaution Booklet Issued: No Precaution Comments: Per Ortho note  on 1/10: he can use the LUE to use a RW Required Braces or Orthoses: Sling Restrictions Weight Bearing Restrictions: Yes LUE Weight Bearing: Non weight bearing    Mobility  Bed Mobility Overal bed mobility: Needs Assistance Bed Mobility: Rolling;Sidelying to Sit Rolling: Min guard Sidelying to sit: Min assist       General bed mobility comments: rolled to R with cues and needed min  assist to push up to sit with R UE  Transfers Overall transfer level: Needs assistance Equipment used: 1 person hand held assist;Rolling walker (2 wheeled) Transfers: Sit to/from Stand Sit to Stand: Mod assist;Min assist Stand pivot transfers: Min guard       General transfer comment: Heavy Mod assis to stand from low bed; min assist to stand from 3in1 using R armrest to cues for safety, R UE NWB adherence  Ambulation/Gait Ambulation/Gait assistance: Min assist;Mod assist Gait Distance (Feet): 30 Feet (+50) Assistive device: Rolling walker (2 wheeled) Gait Pattern/deviations: Step-through pattern;Narrow base of support     General Gait Details: Opted to try RW today (per Otho note, Ok to use RW), in light of changing DC plan; able to use RW well for short distance (bed to bathroom; Noted incr weight bearing LUE on RW with progressive amb   Stairs             Wheelchair Mobility    Modified Rankin (Stroke Patients Only)       Balance Overall balance assessment: Needs assistance;History of Falls   Sitting balance-Leahy Scale: Fair Sitting balance - Comments: posterior lean   Standing balance support: Bilateral upper extremity supported Standing balance-Leahy Scale: Fair  Cognition Arousal/Alertness: Awake/alert Behavior During Therapy: Flat affect Overall Cognitive Status: Impaired/Different from baseline Area of Impairment: Safety/judgement;Awareness;Problem solving;Following commands;Memory                        Following Commands: Follows one step commands with increased time Safety/Judgement: Decreased awareness of safety;Decreased awareness of deficits     General Comments: pt with improved cognition this session      Exercises      General Comments General comments (skin integrity, edema, etc.): Orthostatic BPs obtained; while there was a notable SBP drop from supine to stnading, he did not report dizziness or not feeling well;   05/15/20 1125  Vital Signs  Patient Position (if appropriate) Orthostatic Vitals  Orthostatic Lying   BP- Lying (!) 158/97  Pulse- Lying 99  Orthostatic Sitting  BP- Sitting 132/87  Pulse- Sitting 87  Orthostatic Standing at 0 minutes  BP- Standing at 0 minutes 112/74  Pulse- Standing at 0 minutes 89  Orthostatic Standing at 3 minutes  BP- Standing at 3 minutes 112/70  Pulse- Standing at 3 minutes 114        Pertinent Vitals/Pain Pain Assessment: Faces Faces Pain Scale: Hurts a little bit Pain Location: shoulder Pain Descriptors / Indicators: Grimacing Pain Intervention(s): Monitored during session    Home Living                      Prior Function            PT Goals (current goals can now be found in the care plan section) Acute Rehab PT Goals Patient Stated Goal: per family to bring him home with assistance PT Goal Formulation: With family Time For Goal Achievement: 2020-06-14 Potential to Achieve Goals: Fair Progress towards PT goals: Progressing toward goals    Frequency    Min 3X/week      PT Plan Discharge plan needs to be updated;Other (comment) (Family adamantly declining SNF)    Co-evaluation PT/OT/SLP Co-Evaluation/Treatment: Yes Reason for Co-Treatment: For patient/therapist safety (presyncope yesterday) PT goals addressed during session: Mobility/safety with mobility OT goals addressed during session: ADL's and self-care;Proper use of Adaptive equipment and DME      AM-PAC PT "6 Clicks" Mobility    Outcome Measure  Help needed turning from your back to your side while in a flat bed without using bedrails?: A Little Help needed moving from lying on your back to sitting on the side of a flat bed without using bedrails?: A Little Help needed moving to and from a bed to a chair (including a wheelchair)?: A Lot Help needed standing up from a chair using your arms (e.g., wheelchair or bedside chair)?: A Lot Help needed to walk in hospital room?: A Little Help needed climbing 3-5 steps with a railing? : A Lot 6 Click Score: 15    End of Session Equipment Utilized During Treatment: Gait belt (Sling) Activity Tolerance: Patient tolerated treatment well Patient left: in chair;with call bell/phone within reach;with family/visitor present;with nursing/sitter in room Nurse Communication: Mobility status PT Visit Diagnosis: Other abnormalities of gait and mobility (R26.89);Other symptoms and signs involving the nervous system (R29.898)     Time: 1120-1150 PT Time Calculation (min) (ACUTE ONLY): 30 min  Charges:  $Gait Training: 8-22 mins                     Roney Marion, [place] Pager [phone] Office [phone]  [place] 05/15/2020, 4:17 PM

## 2020-05-15 NOTE — Progress Notes (Signed)
   ORTHOPAEDIC PROGRESS NOTE  s/p Procedure(s): REVERSE SHOULDER ARTHROPLASTY  SUBJECTIVE: Patient resting in hospital bed this morning. More alert today. Patient unable to go to CIR. Wife is nervous about nursing home because of COVID and his short term memory loss. Also nervous about home health care because she is unable to transfer him alone. He has only walked to the bathroom with 2 assists.  OBJECTIVE: PE: General: resting comfortably in hospital bed, NAD Left shoulder: Dressing CDI and sling well fitting,  full and painless ROM throughout hand with DPC of 0. + Motor in  AIN, PIN, Ulnar distributions. Axillary nerve sensation preserved and symmetric.  Sensation intact distally. Well perfused digits.    Vitals:   05/14/20 2013 05/15/20 0828  BP: 133/80 (!) 168/96  Pulse: 75 77  Resp: 20 15  Temp: 97.8 F (36.6 C) (!) 97.5 F (36.4 C)  SpO2: 94% 93%     ASSESSMENT: Eric George is a 87 y.o. male POD#4  PLAN: Weightbearing: NWB LUE   - okay to use arm for activities of daily living including feeding but minimizing shoulder motion.   - Okay for passive and active range of motion of the elbow and wrist keeping the hands in front of the abdomen and chest would be reasonable while sitting and supervised  - can use left arm when using his walker    - sling should be on at all times except for exercises, certain ADLs, and when using walker Insicional and dressing care: Reinforce dressings as needed Orthopedic device(s): None Showering: Post-op day #2 VTE prophylaxis: Lovenox 40mg  qd while in the hospital will transition to aspirin outpatient Pain control: PRN pain medications, preferring oral medications. Minimize narcotics to avoid worsening delirium Post-op delirium: per medicine "Resume sertraline 100mg  in the morning. Haldol 2 mg IV every 6 hours as needed Will avoid using Ativan, however reserved for severe agitation and difficult to control behavior." Follow - up  plan: 1 week in office with Dr. Griffin Basil for repeat x-rays Dispo: TBD. CIR unable to accept patient. Patient's family nervous about SNF due to COVID and his memory loss. Would prefer a memory care facility. They would like to consider home health if that is an option. However, patient's wife is unable to care for him by herself. He is at a high risk for falls. TOC following.   Contact information:  Dr. Ophelia Charter, Noemi Chapel PA-C, After hours and holidays please check [email or web address] for group call information for [facility]  Judyann Munson 05/15/2020

## 2020-05-16 DIAGNOSIS — G9340 Encephalopathy, unspecified: Secondary | ICD-10-CM

## 2020-05-16 MED ORDER — HALOPERIDOL LACTATE 5 MG/ML IJ SOLN
2.0000 mg | Freq: Four times a day (QID) | INTRAMUSCULAR | Status: DC | PRN
  Administered 2020-05-16 – 2020-05-22 (×2): 2 mg via INTRAMUSCULAR
  Filled 2020-05-16 (×2): qty 1

## 2020-05-16 MED ORDER — LEVETIRACETAM 500 MG PO TABS: 500.0000 mg | ORAL_TABLET | Freq: Two times a day (BID) | ORAL | 0 refills | Status: AC

## 2020-05-16 MED ORDER — OXYCODONE HCL 5 MG PO TABS: 5.0000 mg | ORAL_TABLET | Freq: Four times a day (QID) | ORAL | 0 refills | Status: AC | PRN

## 2020-05-16 MED ORDER — DOCUSATE SODIUM 100 MG PO CAPS: 100.0000 mg | ORAL_CAPSULE | Freq: Two times a day (BID) | ORAL | 0 refills | Status: AC

## 2020-05-16 NOTE — Progress Notes (Signed)
     Referral received for [facility] :goals of care discussion. Chart reviewed and updates received from RN. Patient has discharge orders and summary in place. Of note recommendations for outpatient Palliative support has been identified.   No further needs from palliative at this time in the setting of set plan for disposition and outpatient referral which is appropriate.   Please call if further need for Palliative to be of assistance during admission.   Alda Lea, AGPCNP-BC Palliative Medicine Team  Phone: [phone]   NO CHARGE

## 2020-05-16 NOTE — TOC Initial Note (Addendum)
Transition of Care [facility]) progression note   Patient Details  Name: Eric George MRN: [id number] Date of Birth: August 17, 1930  Transition of Care [facility]) CM/SW Contact:    Sharin Mons, RN Phone Number: 05/16/2020, 10:37 AM  Clinical Narrative:                  Late entry 05/16/2019 1500 NCM spoke with pt's wife @ bedside regarding plan for d/c on 05/17/2019. Pt  denied for CIR. Wife refuses SNF placement.  Wife agreeable to [facility] services. States has used [facility] in the past and would like to use them again post d/c. Referral made with [facility] and accepted. Liaison to f/u with wife @ bedside. Wife expressed need for 3in1/bsc and wheelchair. Orders placed for DME need. Referral made with [facility]. Equipment will be delivered to bedside prior to d/c.  TOC will continue to monitor and assist with needs....  Expected Discharge Plan: [place] Barriers to Discharge: Continued Medical Work up   Patient Goals and CMS Choice     Choice offered to / list presented to : Spouse  Expected Discharge Plan and Services Expected Discharge Plan: [place]   Discharge Planning Services: CM Consult     Expected Discharge Date: 05/16/20               DME Arranged: Wheelchair manual,3-N-1 DME Agency: [facility] Date DME Agency Contacted: 05/15/20 Time DME Agency Contacted: [phone] Representative spoke with at DME Agency: Darcel Bayley Arranged: RN,PT,OT,Nurse's Aide,Social Work [employer] Agency: [facility] Date [place]: 05/15/20 Time [place]: 1504 Representative spoke with at [place]: [place] Arrangements/Services                       Activities of Daily [place] Devices/Equipment: Engineer, drilling (specify type) ADL Screening (condition at time of admission) Patient's cognitive ability adequate to safely complete daily activities?: No Is the patient deaf or have difficulty  hearing?: No Does the patient have difficulty seeing, even when wearing glasses/contacts?: No Does the patient have difficulty concentrating, remembering, or making decisions?: Yes Patient able to express need for assistance with ADLs?: No Does the patient have difficulty dressing or bathing?: Yes Independently performs ADLs?: No Communication: Independent Dressing (OT): Independent Is this a change from baseline?: Pre-admission baseline Grooming: Independent Is this a change from baseline?: Pre-admission baseline Feeding: Independent Bathing: Independent Is this a change from baseline?: Pre-admission baseline Toileting: Independent In/Out Bed: Independent Walks in Home: Independent Does the patient have difficulty walking or climbing stairs?: Yes Weakness of Legs: Both Weakness of Arms/Hands: None  Permission Sought/Granted                  Emotional Assessment              Admission diagnosis:  Seizure ([place]) [R56.9] Pain [R52] New onset seizure ([place]) [R56.9] Dislocation of right shoulder joint, initial encounter [S43.004A] Patient Active Problem List   Diagnosis Date Noted  . New onset seizure ([place]) 05/11/2020  . Anterior dislocation of left shoulder 05/11/2020  . Acute metabolic encephalopathy 11/91/4782  . Ventricular bigeminy 04/26/2019  . Acute lower UTI   . Prediabetes   . Gait disorder 04/28/2018  . Acute encephalopathy 04/26/2018  . Dupuytren's contracture 10/30/2017  . Frequent falls 10/30/2017  . History of closed head injury 07/25/2017  . Osteoarthritis, multiple sites 07/08/2016  . Risk for falls 07/08/2016  .  Premature atrial contractions 05/11/2015  . Sepsis ([place]) 04/29/2015  . History of small bowel obstruction 04/29/2015  . History of traumatic brain injury 04/29/2015  . Acute respiratory failure ([place]) 04/29/2015  . CAP (community acquired pneumonia)   . Unspecified cerebral artery occlusion with cerebral infarction 08/25/2012  . Aphasia  08/25/2012  . Macular degeneration 04/25/2012  . CVA (cerebral infarction) 03/20/2012  . Small bowel obstruction ([place]) 09/12/2011  . Nausea & vomiting 09/12/2011  . Memory loss 04/01/2010  . CARCINOMA, SKIN, SQUAMOUS CELL 10/28/2009  . SKIN LESION 10/24/2009  . NEOPLASM, SKIN, UNCERTAIN BEHAVIOR 44/92/0100  . DYSGEUSIA 10/10/2009  . Hyperlipidemia 07/20/2008  . DEPRESSION 07/20/2008  . Essential hypertension 07/20/2008  . RHINITIS 07/20/2008  . COLONIC POLYPS, HX OF 07/20/2008  . GERD (gastroesophageal reflux disease) 07/18/2008  . PROSTATE CANCER, HX OF 07/18/2008   PCP:  Eulas Post, MD Pharmacy:   [facility] [address] [place], [place]. [place] [address] Phone: [phone] Fax: [phone]  [id number] [address] [place] [address] Phone: [phone] Fax: [phone]  [facility] [place] [place] [facility] [employer] [place] [address] Phone: [phone] Fax: [phone]     Social Determinants of Health (SDOH) Interventions    Readmission Risk Interventions No flowsheet data found.

## 2020-05-16 NOTE — TOC Progression Note (Addendum)
Transition of Care [facility]) - Progression Note    Patient Details  Name: Eric George MRN: [id number] Date of Birth: 12/21/1930  Transition of [facility]) CM/SW Contact  Wandra Feinstein [place], [place] Phone Number: 05/16/2020, 11:45 AM  Clinical Narrative:   SW and CM spoke with pt's wife re [facility] vs SNF. Wife agreeable to consider SNF with memory care option. SW explained limited local skilled nursing facilities with memory care for STR residents ([facility], [facility], [facility]). Spoke to [place] with [facility] and message left for [facility] admissions. Will provide updates as available.   Wandra Feinstein, MSW, LCSW [phone] (coverage)       Expected Discharge Plan: [place] Barriers to Discharge: Continued Medical Work up  Expected Discharge Plan and Services Expected Discharge Plan: [place]   Discharge Planning Services: CM Consult     Expected Discharge Date: 05/16/20               DME Arranged: Wheelchair manual,3-N-1 DME Agency: [facility] Date DME Agency Contacted: 05/15/20 Time DME Agency Contacted: [phone] Representative spoke with at DME Agency: Darcel Bayley Arranged: RN,PT,OT,Nurse's Aide,Social Work [employer] Agency: [facility] Date [place]: 05/15/20 Time [place]: 1504 Representative spoke with at [place]: [place] Determinants of Health ([place]) Interventions    Readmission Risk Interventions No flowsheet data found.

## 2020-05-16 NOTE — Progress Notes (Signed)
Physical Therapy Treatment Patient Details Name: Eric George MRN: [id number] DOB: 1930/06/29 Today's Date: 05/16/2020    History of Present Illness 87 y.o. male with TBI 1990, stroke 2013, HTN, HLD, prostate cancer, GERD, SBO, short-term memory loss presented to [facility] Emergency Department via EMS after having a seizure. He was also found to have a left shoulder fracture dislocation. Underwent L Reverse TSA 1/8. Acute metabolic encephalopathy/delirium and disorientation requiring medicaiton due to agitation 1/12. Symptomatic orthostatic hypotension 1/13.    PT Comments    Pt supine on arrival, drowsy but easily awoken and agreeable to therapy session. Pt following most 1-step commands and with good participation but limited due to symptomatic orthostatic hypotension. Unable to progress gait training due to symptomatic drop in BP, however pt minA for bed mobility to/from EOB x2 trials and min/modA for sit<>stand from bed<>hemi-walker. Pt able to stand 2-3 minutes at most during BP assessment and needs +2 min/modA for safety with static standing due to symptoms and some posterior lean. Pt reporting increased shoulder pain. Pt and caregivers instructed on donning/doffing of sling safely, reverse TSA precautions and wrist/hand/elbow exercises and given HEP handout (link: [email or web address] Access Code: [email or web address]) and encouraged to perform BID/TID as able. Handout also includes step-by-step instructions for donning/doffing sling and for shoulder precautions for family reinforcement. Pt continues to benefit from PT services to progress toward functional mobility goals. D/C recs below remain appropriate at this time, pending progress; not yet safe to progress mobility to simulate home environment due to unstable vitals with mobility this date.  Orthostatic BPs -RN/MD notified Supine 157/107 (118)  Sitting 127/96 (105)  Standing 92/67 (75)  Return to supine 115/103     Follow Up  Recommendations  SNF;Supervision/Assistance - 24 hour;Other (comment) (While post-acute rehab in memory care setting is appropriate for pt and his family, I anticipate they may decline SNF; Must consider [facility]rapies, [place], [facility] -- and Mr. Rochin has been active with [facility])     Equipment Recommendations  Wheelchair (measurements PT);Wheelchair cushion (measurements PT);Hospital bed (Will consider hemiwalker)    Recommendations for Other Services Other (comment) [facility] Liaison for maximizing resources and support)     Precautions / Restrictions Precautions Precautions: Fall;Shoulder Type of Shoulder Precautions: no ROM shoulder; elbow/wrist/digits ROM OK Shoulder Interventions: Shoulder sling/immobilizer;At all times;Off for dressing/bathing/exercises Precaution Booklet Issued: Yes (comment) Precaution Comments: Per Ortho note on 1/10: he can use the LUE to use a RW Required Braces or Orthoses: Sling Restrictions Weight Bearing Restrictions: Yes LUE Weight Bearing: Non weight bearing    Mobility  Bed Mobility Overal bed mobility: Needs Assistance Bed Mobility: Rolling;Sidelying to Sit Rolling: Supervision Sidelying to sit: Min assist       General bed mobility comments: rolled to R with cues and needed min  assist to push up to sit with R UE x2 trials  Transfers Overall transfer level: Needs assistance Equipment used: 1 person hand held assist;Hemi-walker Transfers: Sit to/from Stand Sit to Stand: Min assist         General transfer comment: from EOB to [facility] with minA x3 trials; sling donned, LUE NWB adherence  Ambulation/Gait Ambulation/Gait assistance: Mod assist   Assistive device: Hemi-walker Gait Pattern/deviations: Step-through pattern;Narrow base of support     General Gait Details: from EOB sidesteps toward HOB x4; deferred further gait due to symptomatic orthostatic hypotension   Stairs             Wheelchair Mobility     Modified Rankin (Stroke  Patients Only)       Balance Overall balance assessment: Needs assistance;History of Falls   Sitting balance-Leahy Scale: Fair Sitting balance - Comments: posterior lean at time, able to sit mostly with supervision/min guard   Standing balance support: Single extremity supported Standing balance-Leahy Scale: Fair Standing balance comment: using HW fair static standing balance but needs external support for safety due to symptomatic hypotension                            Cognition Arousal/Alertness: Lethargic Behavior During Therapy: Flat affect;Restless;Impulsive Overall Cognitive Status: Impaired/Different from baseline Area of Impairment: Safety/judgement;Awareness;Problem solving;Following commands;Memory;Orientation                 Orientation Level: Disoriented to;Time;Situation   Memory: Decreased recall of precautions;Decreased short-term memory Following Commands: Follows one step commands with increased time Safety/Judgement: Decreased awareness of safety;Decreased awareness of deficits   Problem Solving: Slow processing;Decreased initiation;Difficulty sequencing;Requires verbal cues;Requires tactile cues General Comments: pt fatigued/drowsy and frequently keeping eyes closed, but following most simple 1-step commands; dizzy with mobility, but good verbalizations/able to respond to most questions appropriately      Exercises General Exercises - Upper Extremity Wrist Flexion: AROM;Strengthening;Left;10 reps;Supine Wrist Extension: AROM;Strengthening;Left;10 reps;Supine Digit Composite Flexion: AROM;Strengthening;Left;10 reps;Supine Composite Extension: AROM;Strengthening;Left;10 reps;Supine Donning/doffing sling/immobilizer: Caregiver independent with task Correct positioning of sling/immobilizer: Supervision/safety;Caregiver independent with task ROM for elbow, wrist and digits of operated UE: Caregiver independent with  task;Minimal assistance Sling wearing schedule (on at all times/off for ADL's): Supervision/safety;Caregiver independent with task Positioning of UE while sleeping: Supervision/safety;Caregiver independent with task    General Comments        Pertinent Vitals/Pain Pain Assessment: Faces Faces Pain Scale: Hurts even more Pain Location: shoulder Pain Descriptors / Indicators: Grimacing;Restless;Operative site guarding Pain Intervention(s): Monitored during session;Limited activity within patient's tolerance;Patient requesting pain meds-RN notified    Home Living                      Prior Function            PT Goals (current goals can now be found in the care plan section) Acute Rehab PT Goals Patient Stated Goal: per family to bring him home with assistance PT Goal Formulation: With family Time For Goal Achievement: 2020/06/09 Potential to Achieve Goals: Fair Progress towards PT goals: Progressing toward goals    Frequency    Min 3X/week      PT Plan Other (comment);Current plan remains appropriate (Family adamantly declining SNF but may be willing to consider memory care facility)    Co-evaluation PT/OT/SLP Co-Evaluation/Treatment: Yes            AM-PAC PT "6 Clicks" Mobility   Outcome Measure  Help needed turning from your back to your side while in a flat bed without using bedrails?: A Little Help needed moving from lying on your back to sitting on the side of a flat bed without using bedrails?: A Little Help needed moving to and from a bed to a chair (including a wheelchair)?: A Lot Help needed standing up from a chair using your arms (e.g., wheelchair or bedside chair)?: A Lot Help needed to walk in hospital room?: A Little Help needed climbing 3-5 steps with a railing? : A Lot 6 Click Score: 15    End of Session Equipment Utilized During Treatment: Gait belt (Sling LUE) Activity Tolerance: Patient limited by pain;Treatment limited secondary to  medical complications (  Comment) (sxs orthostatic hypotension) Patient left: with call bell/phone within reach;with family/visitor present;in bed;with bed alarm set;with SCD's reapplied Nurse Communication: Mobility status;Patient requests pain meds PT Visit Diagnosis: Other abnormalities of gait and mobility (R26.89);Other symptoms and signs involving the nervous system (R29.898)     Time: [id number] PT Time Calculation (min) (ACUTE ONLY): 44 min  Charges:  $Therapeutic Exercise: 8-22 mins $Therapeutic Activity: 23-37 mins                     Karmel Patricelli P., PTA Acute Rehabilitation Services Pager: [phone] Office: [place] 05/16/2020, 4:03 PM

## 2020-05-16 NOTE — Progress Notes (Signed)
ORTHOPEDIC PROGRESS NOTE    Subjective: Patient laying comfortably in bed resting. Easily awoken. Wife at bedside. Patient reports pain as mild to moderate. It is well controlled with medicine right now. Had a episode early this morning around 4AM where he removed his arm from the sling. Unsure how long it was out of the sling. Wife and nurse helped put it back on him. He was complaining of pain so gave him Oxycodone at that point. Tolerating diet. No CP, SOB. Having trouble standing on his own to move around room. Wife very concerned about him being discharged to home but she still does not want to send him to a SNF. Says no one has talked to her about how to care for him once he goes home or if anyone will come give [facility].    Objective:   VITALS:   Vitals:   05/15/20 1532 05/15/20 2031 05/16/20 0407 05/16/20 0828  BP: (!) 145/82 (!) 159/79 (!) 178/110 (!) 163/91  Pulse: 87 83 (!) 107 100  Resp: 15 17 17 18   Temp: 97.7 F (36.5 C) 98 F (36.7 C) (!) 97.5 F (36.4 C) (!) 97.4 F (36.3 C)  TempSrc: Oral Oral Oral Oral  SpO2: 95% 93% 96% 95%  Weight:      Height:       CBC Latest Ref Rng & Units 05/13/2020 05/12/2020 05/11/2020  WBC 4.0 - 10.5 K/uL 11.0(H) 15.8(H) 17.3(H)  Hemoglobin 13.0 - 17.0 g/dL 12.3(L) 12.4(L) 15.5  Hematocrit 39.0 - 52.0 % 35.5(L) 35.5(L) 46.5  Platelets 150 - 400 K/uL 174 182 211   BMP Latest Ref Rng & Units 05/13/2020 05/12/2020 05/11/2020  Glucose 70 - 99 mg/dL 125(H) 145(H) 136(H)  BUN 8 - 23 mg/dL 19 17 21   Creatinine 0.61 - 1.24 mg/dL 0.90 0.89 0.94  BUN/Creat Ratio 6 - 22 (calc) - - -  Sodium 135 - 145 mmol/L 141 140 139  Potassium 3.5 - 5.1 mmol/L 3.6 3.8 4.0  Chloride 98 - 111 mmol/L 108 109 105  CO2 22 - 32 mmol/L 24 20(L) 23  Calcium 8.9 - 10.3 mg/dL 9.0 8.5(L) 9.8   Intake/Output      01/12 0701 01/13 0700 01/13 0701 01/14 0700   P.O. 480    Total Intake(mL/kg) 480 (6.1)    Net +480         Urine Occurrence 2 x    Stool Occurrence 1 x        Physical Exam: General: NAD.  Resting comfortably in bed. Resp: No increased wob Cardio: regular rate and rhythm ABD soft Neurologically intact MSK Neurovascularly intact Sensation intact distally Intact pulses distally Dorsiflexion/Plantar flexion intact Incision: dressing C/D/I Left arm in sling  Assessment: 5 Days Post-Op  S/P Procedure(s) (LRB): REVERSE SHOULDER ARTHROPLASTY (Left) by Dr. Ophelia Charter on 05/11/20  Active Problems:   Essential hypertension   GERD (gastroesophageal reflux disease)   History of traumatic brain injury   New onset seizure ([place])   Anterior dislocation of left shoulder   Acute metabolic encephalopathy   Plan: Keep left arm in sling Incentive Spirometry Elevate and Apply ice  Weightbearing: NWB LUE   - Ok to use arm for ADLs including feeding but minimize shoulder motion  - Ok for passive and active ROM of the elbow and wrist if keeping the hands in front of the abdomen and chest while sitting and supervised  - Can use left arm when using his walker  - Sling should be  on at all times except for exercises, certain ADLs, and when using walker Insicional and dressing care: Reinforce dressings as needed Orthopedic device(s): None Showering: Keep dressing dry VTE prophylaxis: Lovenox 40mg  qd while in the hospital will transition to ASA outpatient Pain control: PRN pain medications, preferring oral medications. Minimize narcotics to avoid worsening deliriuim Post-op delirium:  Per medicine "Resume sertraline 100mg . Haldol 2mg  IV q6h PRN. Avoid using Ativan except for severe agitation and difficult to control behavior". Follow - up plan: 1 week in office with Dr. Griffin Basil for repeat xrays   Dispo: TBD. CIR unable to accept patient. PT/OT still recommend SNF. Patient's family nervous about SNF due to [place] and his memory issues. Would prefer a memory care facility. Considering home health and given information about 2 options this morning. Wife  nervous about caring for patient at home on her own.  Contact Information: Dr. Ophelia Charter, [facility] PA-C   Britt Bottom, [place] 05/16/2020, 10:55 AM

## 2020-05-16 NOTE — Social Work (Signed)
Potential SNF bed offers with memory/locked units received from [place]. Both facilities would require pt to be without physical or chemical restraints for 24-48 hours. Pt received IM Haldol today. [place] [facility] to f/u Monday if pt still admitted. Updated pt's wife who reports she will consider SNF options but believes pt will be able to return home with [facility] "once narcotics are out of his system." SW will assist as indicated.   Wandra Feinstein, MSW, LCSW [phone] (coverage)

## 2020-05-16 NOTE — Progress Notes (Signed)
OT Cancellation Note  Patient Details Name: Eric George MRN: [id number] DOB: 08/14/1930   Cancelled Treatment:    Reason Eval/Treat Not Completed: Fatigue/lethargy limiting ability to participate. Pt restless, agitated and given Haldol this morning and unable to participate, OT will follow up next available time  Britt Bottom 05/16/2020, 12:42 PM

## 2020-05-16 NOTE — Care Management Important Message (Signed)
Important Message  Patient Details  Name: Eric George MRN: [id number] Date of Birth: 1930-10-30   Medicare Important Message Given:  Yes     [name] [place] 05/16/2020, 3:25 PM

## 2020-05-16 NOTE — Plan of Care (Signed)
  Problem: Activity: Goal: Risk for activity intolerance will decrease Outcome: Progressing   Problem: Pain Managment: Goal: General experience of comfort will improve Outcome: Progressing   Problem: Safety: Goal: Ability to remain free from injury will improve Outcome: Progressing   

## 2020-05-16 NOTE — TOC Progression Note (Addendum)
Transition of Care [facility]) - Progression Note    Patient Details  Name: Eric George MRN: [id number] Date of Birth: 25-Jul-1930  Transition of Care [facility]) CM/SW Contact  Sharin Mons, RN Phone Number: 05/16/2020, 11:36 AM  Clinical Narrative:    Pt's wife and son upset @ bedside, have concerns with managing pt @ home. States they haven't been shown how to care for pt once d/c .[facility].. managing his shoulder arthroplasty. NCM reached out to rehab office. Ofiice will get in touch with PT /OT to see pt today to address concerns, bedside nurse made aware  as well.  Wife/son states unsure what has been setup regarding [place] services. NCM referred to conversation had with wife on yesterday. Reiterated wife agreeding to [facility] services and her selection of [facility] to provide services, RN,PT,OT,SW.  While @ bedside pt restless, appears agitated. Son states pt  needs 24/7 care !  Revisited with wife PT/OT's evaluation/recommendation: SNF;Supervision/Assistance - 24 hour. Wife stated pt needs rehab on a memory care unit.   NCM reached out to CSW to address matter....  TOC team will continue monitor and follow.....    Expected Discharge Plan: [place] Barriers to Discharge: Continued Medical Work up  Expected Discharge Plan and Services Expected Discharge Plan: [place]   Discharge Planning Services: CM Consult     Expected Discharge Date: 05/16/20               DME Arranged: Wheelchair manual,3-N-1 DME Agency: [employer] Date DME Agency Contacted: 05/15/20 Time DME Agency Contacted: [phone] Representative spoke with at DME Agency: Darcel Bayley Arranged: RN,PT,OT,Nurse's Aide,Social Work [employer] Agency: [facility] Date [place]: 05/15/20 Time [place]: 1504 Representative spoke with at [place]: [place] Determinants of Health ([place]) Interventions    Readmission Risk Interventions No flowsheet data found.

## 2020-05-16 NOTE — Progress Notes (Signed)
[facility] ([facility])  Hospital Liaison RN note         Notified by TOC manager of patient/family request for [facility] Palliative services at home after discharge.              [facility] Palliative team will follow up with patient after discharge.         Please call with any hospice or palliative related questions.         Thank you for the opportunity to participate in this patient's care.     Chrislyn King, BSN, RN [facility] Hospital Liaison (listed on AMION under Hospice/[facility])    [phone] (24h on call)    

## 2020-05-16 NOTE — Progress Notes (Signed)
Patient is agitated, yelling and trying to take off his sling. Haldol 2mg  IM PRN given.

## 2020-05-16 NOTE — NC FL2 (Signed)
[place] MEDICAID FL2 LEVEL OF CARE SCREENING TOOL     IDENTIFICATION  Patient Name: Eric George Birthdate: 1930-09-06 Sex: male Admission Date (Current Location): 05/11/2020  [facility] and [place] Number:      Facility and Address:  [facility] [place] [address] [place], [place] [address]      Provider Number: [id number]  Attending Physician Name and Address:  Aline August, MD  Relative Name and Phone Number:       Current Level of Care: Hospital Recommended Level of Care: [place] Prior Approval Number:    Date Approved/Denied:   PASRR Number:    Discharge Plan: SNF    Current Diagnoses: Patient Active Problem List   Diagnosis Date Noted  . New onset seizure ([place]) 05/11/2020  . Anterior dislocation of left shoulder 05/11/2020  . Acute metabolic encephalopathy 94/85/4627  . Ventricular bigeminy 04/26/2019  . Acute lower UTI   . Prediabetes   . Gait disorder 04/28/2018  . Acute encephalopathy 04/26/2018  . Dupuytren's contracture 10/30/2017  . Frequent falls 10/30/2017  . History of closed head injury 07/25/2017  . Osteoarthritis, multiple sites 07/08/2016  . Risk for falls 07/08/2016  . Premature atrial contractions 05/11/2015  . Sepsis ([place]) 04/29/2015  . History of small bowel obstruction 04/29/2015  . History of traumatic brain injury 04/29/2015  . Acute respiratory failure ([place]) 04/29/2015  . CAP (community acquired pneumonia)   . Unspecified cerebral artery occlusion with cerebral infarction 08/25/2012  . Aphasia 08/25/2012  . Macular degeneration 04/25/2012  . CVA (cerebral infarction) 03/20/2012  . Small bowel obstruction ([place]) 09/12/2011  . Nausea & vomiting 09/12/2011  . Memory loss 04/01/2010  . CARCINOMA, SKIN, SQUAMOUS CELL 10/28/2009  . SKIN LESION 10/24/2009  . NEOPLASM, SKIN, UNCERTAIN BEHAVIOR 03/50/0938  . DYSGEUSIA 10/10/2009  . Hyperlipidemia 07/20/2008  . DEPRESSION 07/20/2008  . Essential  hypertension 07/20/2008  . RHINITIS 07/20/2008  . COLONIC POLYPS, HX OF 07/20/2008  . GERD (gastroesophageal reflux disease) 07/18/2008  . PROSTATE CANCER, HX OF 07/18/2008    Orientation RESPIRATION BLADDER Height & Weight     Self  O2 (2L [place] as needed) Incontinent,Continent Weight: 174 lb 2.6 oz (79 kg) Height:  6\' 2"  (188 cm)  BEHAVIORAL SYMPTOMS/MOOD NEUROLOGICAL BOWEL NUTRITION STATUS  [place] Convulsions/Seizures Continent    AMBULATORY STATUS COMMUNICATION OF NEEDS Skin   Limited Assist Verbally Surgical wounds                       Personal Care Assistance Level of Assistance  Bathing,Dressing Bathing Assistance: Limited assistance   Dressing Assistance: Limited assistance     Functional Limitations Info  Sight,Speech,Hearing Sight Info: Impaired Hearing Info: Adequate Speech Info: Adequate    SPECIAL CARE FACTORS FREQUENCY  PT (By licensed PT),OT (By licensed OT)                    Contractures Contractures Info: Not present    Additional Factors Info  Code Status Code Status Info: FULL CODE             Current Medications (05/16/2020):  This is the current hospital active medication list Current Facility-Administered Medications  Medication Dose Route Frequency Provider Last Rate Last Admin  . albuterol (PROVENTIL) (2.5 MG/3ML) 0.083% nebulizer solution 2.5 mg  2.5 mg Nebulization Q6H PRN McBane, Maylene Roes, PA-C      . aspirin EC tablet 81 mg  81 mg Oral Daily Smith, Rondell  A, MD   81 mg at 05/16/20 0909  . bisacodyl (DULCOLAX) EC tablet 5 mg  5 mg Oral Daily PRN McBane, Jerald Kief, PA-C      . carvedilol (COREG) tablet 3.125 mg  3.125 mg Oral BID WC McBane, Caroline N, PA-C   3.125 mg at 05/16/20 0909  . celecoxib (CELEBREX) capsule 200 mg  200 mg Oral BID Vernetta Honey, PA-C   200 mg at 05/16/20 3474  . Chlorhexidine Gluconate Cloth 2 % PADS 6 each  6 each Topical Daily Clydie Braun, MD   6 each at 05/16/20 1103  .  diphenhydrAMINE (BENADRYL) 12.5 MG/5ML elixir 6.25-12.5 mg  6.25-12.5 mg Oral Q6H PRN McBane, Jerald Kief, PA-C      . docusate sodium (COLACE) capsule 100 mg  100 mg Oral BID Vernetta Honey, PA-C   100 mg at 05/16/20 0910  . enoxaparin (LOVENOX) injection 40 mg  40 mg Subcutaneous Q24H McBane, Caroline N, PA-C   40 mg at 05/16/20 2595  . haloperidol lactate (HALDOL) injection 2 mg  2 mg Intravenous Q6H PRN Dorcas Carrow, MD      . haloperidol lactate (HALDOL) injection 2 mg  2 mg Intramuscular Q6H PRN Glade Lloyd, MD   2 mg at 05/16/20 1131  . irbesartan (AVAPRO) tablet 150 mg  150 mg Oral Daily Vernetta Honey, PA-C   150 mg at 05/16/20 6387  . lactobacillus acidophilus (BACID) tablet 1 tablet  1 tablet Oral Daily Vernetta Honey, PA-C   1 tablet at 05/16/20 5643  . levETIRAcetam (KEPPRA) tablet 500 mg  500 mg Oral BID Vernetta Honey, PA-C   500 mg at 05/16/20 3295  . magnesium citrate solution 1 Bottle  1 Bottle Oral Once PRN McBane, Jerald Kief, PA-C      . metoCLOPramide (REGLAN) tablet 5-10 mg  5-10 mg Oral Q8H PRN McBane, Jerald Kief, PA-C       Or  . metoCLOPramide (REGLAN) injection 5-10 mg  5-10 mg Intravenous Q8H PRN McBane, Caroline N, PA-C      . ondansetron (ZOFRAN) tablet 4 mg  4 mg Oral Q6H PRN McBane, Jerald Kief, PA-C       Or  . ondansetron (ZOFRAN) injection 4 mg  4 mg Intravenous Q6H PRN McBane, Caroline N, PA-C      . oxyCODONE (Oxy IR/ROXICODONE) immediate release tablet 5 mg  5 mg Oral Q4H PRN Vernetta Honey, PA-C   5 mg at 05/16/20 0441  . oxyCODONE (Oxy IR/ROXICODONE) immediate release tablet 7.5 mg  7.5 mg Oral Q4H PRN McBane, Jerald Kief, PA-C      . pantoprazole (PROTONIX) EC tablet 40 mg  40 mg Oral Daily Vernetta Honey, PA-C   40 mg at 05/16/20 0910  . polyethylene glycol (MIRALAX / GLYCOLAX) packet 17 g  17 g Oral Q M,W,F McBane, Caroline N, PA-C   17 g at 05/13/20 1003  . polyethylene glycol (MIRALAX / GLYCOLAX) packet 17 g  17 g Oral Daily PRN  McBane, Jerald Kief, PA-C      . sertraline (ZOLOFT) tablet 100 mg  100 mg Oral Daily Dorcas Carrow, MD   100 mg at 05/16/20 0909  . sodium chloride flush (NS) 0.9 % injection 3 mL  3 mL Intravenous Q12H Smith, Rondell A, MD   3 mL at 05/13/20 1005     Discharge Medications: Please see discharge summary for a list of discharge medications.  Relevant Imaging Results:  Relevant Lab  Results:   Additional Information SS# [id number]  Amador Cunas, [place]

## 2020-05-16 NOTE — Discharge Summary (Signed)
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 of SARS-CoV-2 by FDA under an Emergency Use Authorization (EUA). This EUA will remain  in effect (meaning this test can be used) for the duration of the COVID-19 declaration under Section 564(b)(1) of the Act, 21 U.S.C.section 360bbb-3(b)(1), unless the authorization is terminated  or revoked sooner.       Influenza A by PCR NEGATIVE NEGATIVE Final   Influenza B by PCR NEGATIVE NEGATIVE Final    Comment: (NOTE) The Xpert Xpress SARS-CoV-2/FLU/RSV plus assay is intended as an aid in the diagnosis of influenza from Nasopharyngeal swab specimens and should not be used as a sole basis  for treatment. Nasal washings and aspirates are unacceptable for Xpert Xpress SARS-CoV-2/FLU/RSV testing.  Fact Sheet for Patients: EntrepreneurPulse.com.au  Fact Sheet for Healthcare Providers: IncredibleEmployment.be  This test is not yet approved or cleared by the [place] FDA and has been authorized for detection and/or diagnosis of SARS-CoV-2 by FDA under an Emergency Use Authorization (EUA). This EUA will remain in effect (meaning this test can be used) for the duration of the COVID-19 declaration under Section 564(b)(1) of the Act, 21 U.S.C. section 360bbb-3(b)(1), unless  the authorization is terminated or revoked.  Performed at [facility], [place] [address], [place], [place] [address]      Time coordinating discharge: 35 minutes  SIGNED:   Aline August, MD  [facility] 05/16/2020, 9:31 AM

## 2020-05-17 DIAGNOSIS — I10 Essential (primary) hypertension: Secondary | ICD-10-CM | POA: Diagnosis not present

## 2020-05-17 DIAGNOSIS — S43015A Anterior dislocation of left humerus, initial encounter: Secondary | ICD-10-CM | POA: Diagnosis not present

## 2020-05-17 DIAGNOSIS — Z87820 Personal history of traumatic brain injury: Secondary | ICD-10-CM | POA: Diagnosis not present

## 2020-05-17 LAB — COMPREHENSIVE METABOLIC PANEL
ALT: 18 U/L (ref 0–44)
AST: 21 U/L (ref 15–41)
Albumin: 3 g/dL — ABNORMAL LOW (ref 3.5–5.0)
Alkaline Phosphatase: 65 U/L (ref 38–126)
Anion gap: 12 (ref 5–15)
BUN: 26 mg/dL — ABNORMAL HIGH (ref 8–23)
CO2: 22 mmol/L (ref 22–32)
Calcium: 9.3 mg/dL (ref 8.9–10.3)
Chloride: 105 mmol/L (ref 98–111)
Creatinine, Ser: 0.96 mg/dL (ref 0.61–1.24)
GFR, Estimated: >60 mL/min (ref >60)
Glucose, Bld: 105 mg/dL — ABNORMAL HIGH (ref 70–99)
Potassium: 3.9 mmol/L (ref 3.5–5.1)
Sodium: 139 mmol/L (ref 135–145)
Total Bilirubin: 1.2 mg/dL (ref 0.3–1.2)
Total Protein: 6 g/dL — ABNORMAL LOW (ref 6.5–8.1)

## 2020-05-17 LAB — CBC WITH DIFFERENTIAL/PLATELET
Abs Immature Granulocytes: 0.15 10*3/uL — ABNORMAL HIGH (ref 0.00–0.07)
Basophils Absolute: 0 10*3/uL (ref 0.0–0.1)
Basophils Relative: 0 %
Eosinophils Absolute: 0.5 10*3/uL (ref 0.0–0.5)
Eosinophils Relative: 5 %
HCT: 38.6 % — ABNORMAL LOW (ref 39.0–52.0)
Hemoglobin: 12.6 g/dL — ABNORMAL LOW (ref 13.0–17.0)
Immature Granulocytes: 2 %
Lymphocytes Relative: 12 %
Lymphs Abs: 1.1 10*3/uL (ref 0.7–4.0)
MCH: 29 pg (ref 26.0–34.0)
MCHC: 32.6 g/dL (ref 30.0–36.0)
MCV: 88.9 fL (ref 80.0–100.0)
Monocytes Absolute: 0.9 10*3/uL (ref 0.1–1.0)
Monocytes Relative: 10 %
Neutro Abs: 6.2 10*3/uL (ref 1.7–7.7)
Neutrophils Relative %: 71 %
Platelets: 217 10*3/uL (ref 150–400)
RBC: 4.34 MIL/uL (ref 4.22–5.81)
RDW: 12.6 % (ref 11.5–15.5)
WBC: 8.9 10*3/uL (ref 4.0–10.5)
nRBC: 0 % (ref 0.0–0.2)

## 2020-05-17 LAB — AMMONIA: Ammonia: 28 umol/L (ref 9–35)

## 2020-05-17 LAB — FOLATE: Folate: 6.6 ng/mL (ref >5.9)

## 2020-05-17 LAB — MAGNESIUM: Magnesium: 2 mg/dL (ref 1.7–2.4)

## 2020-05-17 LAB — TSH: TSH: 2.146 u[IU]/mL (ref 0.350–4.500)

## 2020-05-17 LAB — VITAMIN B12: Vitamin B-12: 465 pg/mL (ref 180–914)

## 2020-05-17 MED ORDER — OXYCODONE HCL 5 MG PO TABS
5.0000 mg | ORAL_TABLET | Freq: Four times a day (QID) | ORAL | Status: DC | PRN
  Administered 2020-05-22: 5 mg via ORAL
  Filled 2020-05-17: qty 1

## 2020-05-17 MED ORDER — ACETAMINOPHEN 325 MG PO TABS
650.0000 mg | ORAL_TABLET | Freq: Four times a day (QID) | ORAL | Status: DC | PRN
  Administered 2020-05-17 – 2020-05-19 (×5): 650 mg via ORAL
  Filled 2020-05-17 (×5): qty 2

## 2020-05-17 MED ORDER — CELECOXIB 100 MG PO CAPS: 100.0000 mg | ORAL_CAPSULE | Freq: Two times a day (BID) | ORAL | 0 refills | Status: AC

## 2020-05-17 MED ORDER — ASPIRIN 81 MG PO CHEW: 81.0000 mg | CHEWABLE_TABLET | Freq: Two times a day (BID) | ORAL | 0 refills | Status: DC

## 2020-05-17 MED ORDER — ACETAMINOPHEN 500 MG PO TABS: 1000.0000 mg | ORAL_TABLET | Freq: Three times a day (TID) | ORAL | 0 refills | Status: AC

## 2020-05-17 MED ORDER — TRAMADOL HCL 50 MG PO TABS: 50.0000 mg | ORAL_TABLET | Freq: Four times a day (QID) | ORAL | Status: DC | PRN

## 2020-05-17 MED ORDER — TRAMADOL HCL 50 MG PO TABS: 50.0000 mg | ORAL_TABLET | Freq: Four times a day (QID) | ORAL | 0 refills | Status: AC | PRN

## 2020-05-17 NOTE — Plan of Care (Signed)
No acute events since the previous night that I took care of him. Gave Tylenol 650 mg PO as ordered prn per wife's request to help manage pain and for patient to rest tonight. Wife is staying the night, bed alarm on and in place per fall precautions. No apparent distress or needs voiced. Will continue to monitor and continue current POC.

## 2020-05-17 NOTE — Progress Notes (Signed)
Explained to patient  That he needs to wear Ted hose per order for his orthostatic BP. Patient agitated. Wife asked to start it tomorrow. Ted hose in room.

## 2020-05-17 NOTE — Progress Notes (Addendum)
Physical Therapy Treatment Patient Details Name: Eric George MRN: [id number] DOB: November 21, 1930 Today's Date: 05/17/2020    History of Present Illness 87 y.o. male with TBI 1990, stroke 2013, HTN, HLD, prostate cancer, GERD, SBO, short-term memory loss presented to [facility] Emergency Department via EMS after having a seizure. He was also found to have a left shoulder fracture dislocation. Underwent L Reverse TSA 1/8. Acute metabolic encephalopathy/delirium and disorientation requiring medicaiton due to agitation 1/12. Symptomatic orthostatic hypotension 1/13 & 1/14.    PT Comments    Pt up in chair on arrival, agreeable to therapy session with good participation and fair tolerance for session. Pt continues to experience symptoms of fatigue/dizziness with standing and has orthostatic hypotension per vitals assessment, therefore unable to progress gait distance beyond chair>bed transfer ~52ft using HW and +2 minA. Pt performed seated BLE and RUE therapeutic exercises as detailed below with good tolerance, needing multimodal cues for technique/reps due to cognitive deficit. Unable to progress ambulation due to aforementioned hypotensive symptoms with standing, pt may benefit from TED hose ordered for legs to see if this improves standing BP vs ace wrapping legs prior to gait trial. Pt continues to benefit from PT services to progress toward functional mobility goals. D/C recs below, pending pt progress and family decision.  Orthostatic BPs Sitting 98/63 (75); HR 86 bpm  Standing 75/64 (69); HR 90's bpm  Sitting after standing 93/77 (85); HR 110 bpm  Sitting after 5 mins (reclined) 105/68 (77) HR 90 bpm    Follow Up Recommendations  SNF;Supervision/Assistance - 24 hour;Other (comment) (While post-acute rehab in memory care setting is appropriate for pt and his family, I anticipate they may decline SNF; Must consider HHtherapies, [facility], [facility] -- and Mr. Lard has been active with [facility])     Equipment Recommendations  Wheelchair (measurements PT);Wheelchair cushion (measurements PT);Hospital bed (Will consider hemiwalker)    Recommendations for Other Services Other (comment) [facility] Liaison for maximizing resources and support)     Precautions / Restrictions Precautions Precautions: Fall;Shoulder Type of Shoulder Precautions: no ROM shoulder; elbow/wrist/digits ROM OK Shoulder Interventions: Shoulder sling/immobilizer;At all times;Off for dressing/bathing/exercises Precaution Booklet Issued: Yes (comment) Precaution Comments: Per Ortho note on 1/10: he can use the LUE to use a RW Required Braces or Orthoses: Sling Restrictions Weight Bearing Restrictions: Yes LUE Weight Bearing: Non weight bearing    Mobility  Bed Mobility Overal bed mobility: Needs Assistance Bed Mobility: Sit to Supine       Sit to supine: Min guard (for safety)   General bed mobility comments: min guard to guide trunk safely to supine; pt able to lift BLE up onto bed unassisted  Transfers Overall transfer level: Needs assistance Equipment used: Hemi-walker Transfers: Sit to/from Stand Sit to Stand: Min guard;+2 safety/equipment         General transfer comment: from recliner x3 trials; pt dizzy upon standing  Ambulation/Gait Ambulation/Gait assistance: Min assist;+2 safety/equipment Gait Distance (Feet): 6 Feet Assistive device: Hemi-walker Gait Pattern/deviations: Step-through pattern;Narrow base of support (downward gaze)     General Gait Details: from chair>bed; deferred further gait due to symptomatic orthostatic hypotension   Stairs             Wheelchair Mobility    Modified Rankin (Stroke Patients Only)       Balance Overall balance assessment: Needs assistance;History of Falls   Sitting balance-Leahy Scale: Fair Sitting balance - Comments: posterior lean at times, able to sit forward in chair and at  EOB briefly with supervision/min guard    Standing balance support: Single extremity supported;During functional activity Standing balance-Leahy Scale: Fair Standing balance comment: using HW fair static standing balance but needs external support for safety due to symptomatic hypotension in stance; +2 for safety with ambulation back to bed due to sxs and pt impulsivity                            Cognition Arousal/Alertness: Awake/alert Behavior During Therapy: Flat affect;Restless;Impulsive Overall Cognitive Status: Impaired/Different from baseline Area of Impairment: Safety/judgement;Awareness;Problem solving;Following commands;Memory;Orientation                 Orientation Level: Disoriented to;Time;Situation   Memory: Decreased recall of precautions;Decreased short-term memory Following Commands: Follows one step commands with increased time;Follows one step commands consistently Safety/Judgement: Decreased awareness of safety;Decreased awareness of deficits   Problem Solving: Slow processing;Difficulty sequencing;Requires verbal cues;Requires tactile cues General Comments: pt tending to keep eyes closed at rest but following most simple 1-step commands; dizzy with mobility, but good verbalizations/able to respond to most questions appropriately      Exercises General Exercises - Lower Extremity Ankle Circles/Pumps: AROM;Strengthening;Both;10 reps;Supine Long Arc Quad: AROM;Strengthening;Both;20 reps;Seated (with rest break after 10) Hip Flexion/Marching: AROM;Strengthening;Both;Seated;20 reps (rest break after set of 10) Other Exercises Other Exercises: Resisted RUE rowing exercise and RUE shoulder flexion x10 reps ea for strengthening and improved hemodynamics (LUE remained in sling throughout session)    General Comments General comments (skin integrity, edema, etc.): SpO2 93-96% on RA, HR 86-110 bpm during mobility tasks      Pertinent Vitals/Pain Pain Assessment: Faces Faces Pain Scale: Hurts  little more Pain Location: shoulder Pain Descriptors / Indicators: Grimacing;Restless;Operative site guarding Pain Intervention(s): Monitored during session;Repositioned;RN gave pain meds during session    Home Living                      Prior Function            PT Goals (current goals can now be found in the care plan section) Acute Rehab PT Goals Patient Stated Goal: per family to bring him home with assistance vs memory care and rehab PT Goal Formulation: With family Time For Goal Achievement: 06/17/2020 Potential to Achieve Goals: Fair Progress towards PT goals: Progressing toward goals    Frequency    Min 3X/week      PT Plan Other (comment);Current plan remains appropriate (Family adamantly declining SNF but may be willing to consider memory care facility if available)    Co-evaluation PT/OT/SLP Co-Evaluation/Treatment: Yes            AM-PAC PT "6 Clicks" Mobility   Outcome Measure  Help needed turning from your back to your side while in a flat bed without using bedrails?: A Little Help needed moving from lying on your back to sitting on the side of a flat bed without using bedrails?: A Little Help needed moving to and from a bed to a chair (including a wheelchair)?: A Little Help needed standing up from a chair using your arms (e.g., wheelchair or bedside chair)?: A Little Help needed to walk in hospital room?: A Little Help needed climbing 3-5 steps with a railing? : A Lot 6 Click Score: 17    End of Session Equipment Utilized During Treatment: Gait belt (Sling LUE) Activity Tolerance: Treatment limited secondary to medical complications (Comment);Patient tolerated treatment well (sxs orthostatic hypotension) Patient left: with call bell/phone  within reach;with family/visitor present;with bed alarm set;in bed Nurse Communication: Mobility status PT Visit Diagnosis: Other abnormalities of gait and mobility (R26.89);Other symptoms and signs  involving the nervous system (R29.898)     Time: 1117 (back in 1200 briefly)-1150 (back out at 1205 briefly) PT Time Calculation (min) (ACUTE ONLY): 33 min  Charges:  $Therapeutic Exercise: 8-22 mins                     Wladyslaw Henrichs P., PTA Acute Rehabilitation Services Pager: [phone] Office: [place] 05/17/2020, 12:20 PM

## 2020-05-17 NOTE — Progress Notes (Addendum)
Occupational Therapy Treatment Patient Details Name: Eric George MRN: [id number] DOB: 1931-05-04 Today's Date: 05/17/2020    History of present illness 87 y.o. male with TBI 1990, stroke 2013, HTN, HLD, prostate cancer, GERD, SBO, short-term memory loss presented to [facility] Emergency Department via EMS after having a seizure. He was also found to have a left shoulder fracture dislocation. Underwent L Reverse TSA 1/8. Acute metabolic encephalopathy/delirium and disorientation requiring medicaiton due to agitation 1/12. Symptomatic orthostatic hypotension 1/13 & 1/14.   OT comments  Pt up in chair on arrival, agreeable to therapy session with good participation and fair tolerance for session. Pt continues to experience symptoms of fatigue/dizziness with standing and has orthostatic hypotension per vitals assessment, pt transferred chair>bed transfer ~59ft using HW and +2 min A. Pt performed seated B RUE ROM exercises (elbow, wrist, hand) with good tolerance, needing multimodal cues for technique/reps due to cognitive deficit. Unable to participate in any further functional mobility with hemiwalker due to hypotensive symptoms with standing, pt may benefit from TED hose ordered for legs to see if this improves standing BP vs ace wrapping legs prior to gait trial. Re educated/reviwed again sling wear, bathing, dressing and L UE elbow/wrist/hand ROM with pt and family. OT will continue to follow acutely to maximize level of function and safety.  Orthostatic BPs Sitting 98/63 (75); HR 86 bpm  Standing 75/64 (69); HR 90's bpm  Sitting after standing 93/77 (85); HR 110 bpm  Sitting after 5 mins (reclined) 105/68 (77) HR 90 bpm     Follow Up Recommendations  SNF;Supervision/Assistance - 24 hour ( family still refusing, so will need max HH services for going home)   Equipment Recommendations  3 in 1 bedside commode    Recommendations for Other Services      Precautions / Restrictions  Precautions Precautions: Fall;Shoulder Type of Shoulder Precautions: no ROM shoulder; elbow/wrist/digits ROM OK Shoulder Interventions: Shoulder sling/immobilizer;At all times;Off for dressing/bathing/exercises Precaution Booklet Issued: Yes (comment) Precaution Comments: Per Ortho note on 1/10: he can use the LUE to use a RW Required Braces or Orthoses: Sling Restrictions Weight Bearing Restrictions: Yes LUE Weight Bearing: Non weight bearing       Mobility Bed Mobility Overal bed mobility: Needs Assistance Bed Mobility: Sit to Supine       Sit to supine: Min guard   General bed mobility comments: min guard to guide trunk safely to supine; pt able to lift BLE up onto bed unassisted  Transfers Overall transfer level: Needs assistance Equipment used: Hemi-walker Transfers: Sit to/from Stand Sit to Stand: Min guard;+2 safety/equipment         General transfer comment: from recliner x3 trials; pt dizzy upon standing    Balance Overall balance assessment: Needs assistance;History of Falls   Sitting balance-Leahy Scale: Fair Sitting balance - Comments: posterior lean at times, able to sit forward in chair and at EOB briefly with supervision/min guard   Standing balance support: Single extremity supported;During functional activity Standing balance-Leahy Scale: Fair Standing balance comment: using HW fair static standing balance but needs external support for safety due to symptomatic hypotension in stance; +2 for safety with ambulation back to bed due to sxs and pt impulsivity                           ADL either performed or assessed with clinical judgement   ADL Overall ADL's : Needs assistance/impaired     Grooming: Wash/dry face;With caregiver  independent assisting;Wash/dry hands;Min guard;Standing                                 General ADL Comments: Again, OT reviewed sling wear, L UE positioning, bathing, dressing toileting with pt,  pt's wife and pt's son     Vision Baseline Vision/History: Wears glasses Patient Visual Report: No change from baseline     Perception     Praxis      Cognition Arousal/Alertness: Awake/alert Behavior During Therapy: Flat affect;Restless;Impulsive Overall Cognitive Status: Impaired/Different from baseline Area of Impairment: Safety/judgement;Awareness;Problem solving;Following commands;Memory;Orientation                 Orientation Level: Disoriented to;Time;Situation   Memory: Decreased recall of precautions;Decreased short-term memory Following Commands: Follows one step commands with increased time;Follows one step commands consistently Safety/Judgement: Decreased awareness of safety;Decreased awareness of deficits   Problem Solving: Slow processing;Difficulty sequencing;Requires verbal cues;Requires tactile cues General Comments: pt tending to keep eyes closed at rest but following most simple 1-step commands; dizzy with mobility, but good verbalizations/able to respond to most questions appropriately        Exercises Exercises: General Lower Extremity General Exercises - Lower Extremity Ankle Circles/Pumps: AROM;Strengthening;Both;10 reps;Supine Long Arc Quad: AROM;Strengthening;Both;20 reps;Seated (with rest break after 10) Hip Flexion/Marching: AROM;Strengthening;Both;Seated;20 reps (rest break after set of 10) Other Exercises Other Exercises: Resisted RUE rowing exercise and RUE shoulder flexion x10 reps ea for strengthening and improved hemodynamics (LUE remained in sling throughout session)   Shoulder Instructions       General Comments SpO2 93-96% on RA, HR 86-110 bpm during mobility tasks    Pertinent Vitals/ Pain       Pain Assessment: No/denies pain Faces Pain Scale: Hurts little more Pain Location: shoulder Pain Descriptors / Indicators: Grimacing;Restless;Operative site guarding Pain Intervention(s): RN gave pain meds during session;Monitored  during session;Repositioned  Home Living                                          Prior Functioning/Environment              Frequency  Min 3X/week        Progress Toward Goals  OT Goals(current goals can now be found in the care plan section)  Progress towards OT goals: Progressing toward goals  Acute Rehab OT Goals Patient Stated Goal: per family to bring him home with assistance vs memory care and rehab  Plan Discharge plan remains appropriate    Co-evaluation    PT/OT/SLP Co-Evaluation/Treatment: Yes Reason for Co-Treatment: Necessary to address cognition/behavior during functional activity;To address functional/ADL transfers;For patient/therapist safety   OT goals addressed during session: ADL's and self-care;Proper use of [employer] equipment and DME      AM-PAC OT "6 Clicks" Daily Activity     Outcome Measure   Help from another person eating meals?: None Help from another person taking care of personal grooming?: A Little Help from another person toileting, which includes using toliet, bedpan, or urinal?: A Lot Help from another person bathing (including washing, rinsing, drying)?: A Lot Help from another person to put on and taking off regular upper body clothing?: A Little Help from another person to put on and taking off regular lower body clothing?: A Lot 6 Click Score: 16    End of Session Equipment Utilized During Treatment:  Gait belt;Other (comment) (L UE lsing, hemiwalker)  OT Visit Diagnosis: Unsteadiness on feet (R26.81);Other abnormalities of gait and mobility (R26.89);Muscle weakness (generalized) (M62.81);History of falling (Z91.81);Other symptoms and signs involving cognitive function;Pain Pain - Right/Left: Left Pain - part of body: Shoulder   Activity Tolerance Patient tolerated treatment well   Patient Left with call bell/phone within reach;with family/visitor present;in bed;with bed alarm set   Nurse Communication  Mobility status;Weight bearing status;Precautions        Time: 7622-6333 OT Time Calculation (min): 49 min  Charges: OT General Charges $OT Visit: 1 Visit OT Treatments $Self Care/Home Management : 8-22 mins $Therapeutic Activity: 8-22 mins     Britt Bottom 05/17/2020, 1:34 PM

## 2020-05-17 NOTE — Plan of Care (Signed)
Patient is s/p left shoulder reversal on 1/8. Patient able to transfer from bed to chair and back with 1-2 assist. Left shoulder in sling. Patient is not complaining of any pain at this time. Family at bedside, bed alarm, and fall mats in place per fall precautions. Discharge plan is home with [facility] versus SNF with memory care option. Patient is in NAD and is stable. Will continue to monitor and continue current POC.

## 2020-05-17 NOTE — TOC Progression Note (Signed)
Transition of Care [facility]) - Progression Note    Patient Details  Name: Eric George MRN: [id number] Date of Birth: 1930-10-02  Transition of Care [facility]) CM/SW Contact  Sharin Mons, RN Phone Number: 05/17/2020, 5:28 PM  Clinical Narrative:    NCM expanded SNF search today after informing pt's wife and son Eric George declined pt for SNF placement.  TOC team will continue to monitor and assist with TOC needs ....   Expected Discharge Plan: Skilled Nursing Facility Barriers to Discharge: No SNF bed  Expected Discharge Plan and Services Expected Discharge Plan: [place]   Discharge Planning Services: CM Consult     Expected Discharge Date: 05/16/20               DME Arranged: Wheelchair manual,3-N-1 DME Agency: [employer] Date DME Agency Contacted: 05/15/20 Time DME Agency Contacted: [phone] Representative spoke with at DME Agency: Darcel Bayley Arranged: RN,PT,OT,Nurse's Aide,Social Work [employer] Agency: [facility] Date [place]: 05/15/20 Time [place]: 1504 Representative spoke with at [place]: [place] Determinants of Health ([place]) Interventions    Readmission Risk Interventions No flowsheet data found.

## 2020-05-17 NOTE — Plan of Care (Signed)
  Problem: Nutrition: Goal: Adequate nutrition will be maintained Outcome: Progressing   Problem: Activity: Goal: Risk for activity intolerance will decrease Outcome: Progressing   Problem: Safety: Goal: Ability to remain free from injury will improve Outcome: Progressing   

## 2020-05-17 NOTE — Progress Notes (Signed)
ORTHOPEDIC PROGRESS NOTE    Subjective: Patient laying comfortably in bed resting. Easily awoken. Wife at bedside. Patient reports pain as mild to moderate. Did have an episode of increased pain yesterday while nursing staff and wife were trying to readjust his sling. Had to be given Haldol IM to calm him down. Slept most of the afternoon and evening after that. Tolerating diet but not drinking much. No CP, SOB. Having trouble standing on his own to move around room. Wife very concerned about him being discharged to home and seems more willing to send him to a SNF as long as it is a memory care center.   Objective:   VITALS:   Vitals:   05/16/20 0828 05/16/20 1502 05/16/20 1900 05/17/20 0550  BP: (!) 163/91 (!) 158/90 133/77 (!) 153/90  Pulse: 100 88 84 85  Resp: 18 18 17 17   Temp: (!) 97.4 F (36.3 C) 98 F (36.7 C) 98.4 F (36.9 C) 98.2 F (36.8 C)  TempSrc: Oral Oral Oral Oral  SpO2: 95% 95% 98% 93%  Weight:      Height:       CBC Latest Ref Rng & Units 05/17/2020 05/13/2020 05/12/2020  WBC 4.0 - 10.5 K/uL 8.9 11.0(H) 15.8(H)  Hemoglobin 13.0 - 17.0 g/dL 12.6(L) 12.3(L) 12.4(L)  Hematocrit 39.0 - 52.0 % 38.6(L) 35.5(L) 35.5(L)  Platelets 150 - 400 K/uL 217 174 182   BMP Latest Ref Rng & Units 05/17/2020 05/13/2020 05/12/2020  Glucose 70 - 99 mg/dL 105(H) 125(H) 145(H)  BUN 8 - 23 mg/dL 26(H) 19 17  Creatinine 0.61 - 1.24 mg/dL 0.96 0.90 0.89  BUN/Creat Ratio 6 - 22 (calc) - - -  Sodium 135 - 145 mmol/L 139 141 140  Potassium 3.5 - 5.1 mmol/L 3.9 3.6 3.8  Chloride 98 - 111 mmol/L 105 108 109  CO2 22 - 32 mmol/L 22 24 20(L)  Calcium 8.9 - 10.3 mg/dL 9.3 9.0 8.5(L)   Intake/Output      01/13 0701 01/14 0700 01/14 0701 01/15 0700   P.O. 0    Total Intake(mL/kg) 0 (0)    Urine (mL/kg/hr) 1 (0)    Total Output 1    Net -1            Physical Exam: General: NAD.  Resting comfortably in bed. Resp: No increased wob Cardio: regular rate and rhythm ABD  soft Neurologically intact MSK Neurovascularly intact Sensation intact distally Intact pulses distally Dorsiflexion/Plantar flexion intact Incision: dressing C/D/I Left arm in sling  Assessment: 6 Days Post-Op  S/P Procedure(s) (LRB): REVERSE SHOULDER ARTHROPLASTY (Left) by Dr. Ophelia Charter on 05/11/20  Active Problems:   Essential hypertension   GERD (gastroesophageal reflux disease)   History of traumatic brain injury   New onset seizure ([place])   Anterior dislocation of left shoulder   Acute metabolic encephalopathy   Plan: Keep left arm in sling Incentive Spirometry Elevate and Apply ice Continue to work with PT/OT to improve balance and strength when standing and walking Medicine team monitoring his episodes of changes in BP that occur when he is mobile   Weightbearing: NWB LUE   - Ok to use arm for ADLs including feeding but minimize shoulder motion  - Ok for passive and active ROM of the elbow and wrist if keeping the hands in front of the abdomen and chest while sitting and supervised  - Can use left arm when using his walker  - Sling should be on at all times  except for exercises, certain ADLs, and when using walker Insicional and dressing care: Reinforce dressings as needed Orthopedic device(s): sling Showering: Keep dressing dry VTE prophylaxis: Lovenox 40mg  qd while in the hospital will transition to ASA outpatient Pain control: PRN pain medications, preferring oral medications. Minimize narcotics to avoid worsening deliriuim Post-op delirium:  Per medicine "Resume sertraline 100mg . Haldol 2mg  IM q6h PRN. Avoid using Ativan except for severe agitation and difficult to control behavior". Follow - up plan: 1 week in office with Dr. Griffin Basil for repeat xrays   Dispo: TBD. CIR unable to accept patient. PT/OT still recommend SNF. Patient's family nervous about SNF due to [place] and his memory issues. Would prefer a memory care facility. SW found 2 facilities that are  willing to accept patient once he's gone 24-48 hours without physical or chemical restraints. Wife nervous about caring for patient at home on her own so seems more receptive to sending him to a SNF now.   Prescriptions printed and placed in patient's chart for d/c to SNF.  D/C when mobilized and ready medically.   Contact Information: Dr. Ophelia Charter, [facility] PA-C   Cherie Lasalle Georgianne Fick, [place] 05/17/2020, 7:56 AM
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
## 2020-05-18 DIAGNOSIS — R569 Unspecified convulsions: Secondary | ICD-10-CM | POA: Diagnosis not present

## 2020-05-18 DIAGNOSIS — S43015D Anterior dislocation of left humerus, subsequent encounter: Secondary | ICD-10-CM | POA: Diagnosis not present

## 2020-05-18 DIAGNOSIS — Z87820 Personal history of traumatic brain injury: Secondary | ICD-10-CM | POA: Diagnosis not present

## 2020-05-18 DIAGNOSIS — G9340 Encephalopathy, unspecified: Secondary | ICD-10-CM | POA: Diagnosis not present

## 2020-05-18 DIAGNOSIS — R296 Repeated falls: Secondary | ICD-10-CM | POA: Diagnosis not present

## 2020-05-18 LAB — CREATININE, SERUM
Creatinine, Ser: 1.11 mg/dL (ref 0.61–1.24)
GFR, Estimated: >60 mL/min (ref >60)

## 2020-05-18 MED ORDER — LORAZEPAM 0.5 MG PO TABS
0.2500 mg | ORAL_TABLET | Freq: Two times a day (BID) | ORAL | Status: DC | PRN
  Administered 2020-05-19: 0.25 mg via ORAL
  Filled 2020-05-18: qty 1

## 2020-05-18 MED ORDER — IRBESARTAN 150 MG PO TABS
75.0000 mg | ORAL_TABLET | Freq: Every day | ORAL | Status: DC
  Administered 2020-05-18 – 2020-05-21 (×4): 75 mg via ORAL
  Filled 2020-05-18 (×4): qty 1

## 2020-05-18 NOTE — Progress Notes (Signed)
Manufacturing engineer [facility]) Hospital Liaison RN note  Notified by [facility] manager of patient/family request for [facility] Palliative services at home after discharge. [facility] Palliative team will follow up with patient after discharge.  Please call with any hospice or palliative related questions.  Thank you for the opportunity to participate in this patient's care.  Venia Carbon RN, BSN, [facility] Liaison
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
## 2020-05-18 NOTE — Plan of Care (Signed)
No acute events since the previous night that I took care of him. Patient's wife and son that is visiting from [place] at bedside. Gave Tylenol to help with rest tonight. NAD. Will continue to monitor and continue current POC.

## 2020-05-19 DIAGNOSIS — G9340 Encephalopathy, unspecified: Secondary | ICD-10-CM | POA: Diagnosis not present

## 2020-05-19 DIAGNOSIS — R296 Repeated falls: Secondary | ICD-10-CM | POA: Diagnosis not present

## 2020-05-19 DIAGNOSIS — Z515 Encounter for palliative care: Secondary | ICD-10-CM

## 2020-05-19 DIAGNOSIS — Z87820 Personal history of traumatic brain injury: Secondary | ICD-10-CM | POA: Diagnosis not present

## 2020-05-19 DIAGNOSIS — S43015D Anterior dislocation of left humerus, subsequent encounter: Secondary | ICD-10-CM | POA: Diagnosis not present

## 2020-05-19 DIAGNOSIS — R569 Unspecified convulsions: Secondary | ICD-10-CM | POA: Diagnosis not present

## 2020-05-19 DIAGNOSIS — I10 Essential (primary) hypertension: Secondary | ICD-10-CM | POA: Diagnosis not present

## 2020-05-19 DIAGNOSIS — F0150 Vascular dementia without behavioral disturbance: Secondary | ICD-10-CM

## 2020-05-19 MED ORDER — LORAZEPAM 0.5 MG PO TABS
0.2500 mg | ORAL_TABLET | ORAL | Status: DC | PRN
  Administered 2020-05-19 – 2020-05-20 (×3): 0.25 mg via ORAL
  Filled 2020-05-19 (×3): qty 1

## 2020-05-19 MED ORDER — QUETIAPINE FUMARATE 25 MG PO TABS
12.5000 mg | ORAL_TABLET | Freq: Every day | ORAL | Status: DC
  Administered 2020-05-19: 12.5 mg via ORAL
  Filled 2020-05-19: qty 1
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
## 2020-05-20 ENCOUNTER — Other Ambulatory Visit: Payer: Self-pay | Admitting: *Deleted

## 2020-05-20 DIAGNOSIS — Z87820 Personal history of traumatic brain injury: Secondary | ICD-10-CM | POA: Diagnosis not present

## 2020-05-20 DIAGNOSIS — G9340 Encephalopathy, unspecified: Secondary | ICD-10-CM | POA: Diagnosis not present

## 2020-05-20 DIAGNOSIS — I10 Essential (primary) hypertension: Secondary | ICD-10-CM | POA: Diagnosis not present

## 2020-05-20 DIAGNOSIS — R451 Restlessness and agitation: Secondary | ICD-10-CM

## 2020-05-20 DIAGNOSIS — S43015D Anterior dislocation of left humerus, subsequent encounter: Secondary | ICD-10-CM | POA: Diagnosis not present

## 2020-05-20 DIAGNOSIS — S069X0S Unspecified intracranial injury without loss of consciousness, sequela: Secondary | ICD-10-CM | POA: Diagnosis not present

## 2020-05-20 MED ORDER — LORAZEPAM 0.5 MG PO TABS
0.5000 mg | ORAL_TABLET | Freq: Four times a day (QID) | ORAL | Status: DC
  Administered 2020-05-20 – 2020-05-22 (×7): 0.5 mg via ORAL
  Filled 2020-05-20 (×7): qty 1

## 2020-05-20 MED ORDER — LORAZEPAM 0.5 MG PO TABS: 0.2500 mg | ORAL_TABLET | Freq: Four times a day (QID) | ORAL | Status: DC

## 2020-05-20 MED ORDER — OLANZAPINE 5 MG PO TABS
5.0000 mg | ORAL_TABLET | Freq: Every day | ORAL | Status: DC | PRN
  Filled 2020-05-20: qty 1

## 2020-05-20 MED ORDER — OLANZAPINE 5 MG PO TABS
5.0000 mg | ORAL_TABLET | Freq: Every day | ORAL | Status: DC
  Administered 2020-05-20 – 2020-05-21 (×2): 5 mg via ORAL
  Filled 2020-05-20 (×3): qty 1

## 2020-05-20 MED ORDER — LORAZEPAM 0.5 MG PO TABS
0.5000 mg | ORAL_TABLET | ORAL | Status: DC | PRN
  Administered 2020-05-22 (×3): 0.5 mg via ORAL
  Filled 2020-05-20 (×4): qty 1

## 2020-05-20 MED ORDER — QUETIAPINE FUMARATE 25 MG PO TABS: 25.0000 mg | ORAL_TABLET | Freq: Every day | ORAL | Status: DC

## 2020-05-20 NOTE — Plan of Care (Signed)
  Problem: Health Behavior/Discharge Planning: Goal: Ability to manage health-related needs will improve Outcome: Progressing   Problem: Activity: Goal: Risk for activity intolerance will decrease Outcome: Progressing   Problem: Pain Managment: Goal: General experience of comfort will improve Outcome: Progressing   Problem: Safety: Goal: Ability to remain free from injury will improve Outcome: Progressing   Problem: Skin Integrity: Goal: Risk for impaired skin integrity will decrease Outcome: Progressing

## 2020-05-20 NOTE — Progress Notes (Signed)
43+ yo Wife has been present for the duration of the hospitalization.  Sons have also been present overnight at it often takes two people to re-direct and comfort the patient.  Son is taking wife home for much needed rest.    Medications for agitation were increased today.  Family requests a sitter for safety.  He has been orthostatic and has fallen each time he tries to get out of bed - he is confused and does not accept that he can not stand.   Order for 1:1 sitter placed but wife also knows we are very short of staff.  Family would prefer sedating medication to physical restraints.     Florentina Jenny, PA-C Palliative Medicine Office:  [phone]

## 2020-05-20 NOTE — Progress Notes (Signed)
Physical Therapy Treatment Patient Details Name: Eric George MRN: [id number] DOB: 03-13-1931 Today's Date: 05/20/2020    History of Present Illness Pt is 87 y.o. male with TBI 1990, stroke 2013, HTN, HLD, prostate cancer, GERD, SBO, short-term memory loss presented to [facility] Emergency Department via EMS after having a seizure. He was also found to have a left shoulder fracture dislocation. Underwent L Reverse TSA 1/8. Acute metabolic encephalopathy/delirium and disorientation requiring medicaiton due to agitation 1/12. Pt also with orthostatic hypotension.    PT Comments    Pt making gradual progress but does still have some limitations due to orthostatic hypotension.  Required frequent cues for safety and not using L UE to push.  Had assist of 2 for safety due to orthostatic hypotension and decreased safety awareness.  Wife was present and reports they have decided against SNF placement and want home with hospice.  They will not be able to have [facility] services if hospice, so provided with HEP and further education on home safety.     Follow Up Recommendations  SNF;Supervision/Assistance - 24 hour;Other (comment) (Family has declined SNF and plan home with hospice:  Recommend 24 hr assist and HH therapies if possible)     Equipment Recommendations  Wheelchair (measurements PT);Wheelchair cushion (measurements PT);Hospital bed    Recommendations for Other Services       Precautions / Restrictions Precautions Precautions: Fall;Shoulder Type of Shoulder Precautions: no ROM shoulder; elbow/wrist/digits ROM OK Shoulder Interventions: Shoulder sling/immobilizer;At all times;Off for dressing/bathing/exercises Precaution Booklet Issued: Yes (comment) Precaution Comments: Per Ortho notes starting on 1/10: he can use the LUE to use a RW Required Braces or Orthoses: Sling Restrictions LUE Weight Bearing: Non weight bearing    Mobility  Bed Mobility Overal bed mobility: Needs  Assistance Bed Mobility: Rolling;Sidelying to Sit Rolling: Supervision Sidelying to sit: Min assist       General bed mobility comments: Min A to lift trunk to prevent pt from pushing with L UE.  Transfers Overall transfer level: Needs assistance Equipment used: 1 person hand held assist Transfers: Sit to/from Stand Sit to Stand: Mod assist;+2 safety/equipment Stand pivot transfers: Min assist;+2 safety/equipment       General transfer comment: Required mod A to stand from low bed but did have assist of 2 for safety due to hx of orthostatic hypotension and confusion/varied participation hx;  Min A x 2 to pivot toward chair.  Ambulation/Gait Ambulation/Gait assistance: Min assist;+2 safety/equipment Gait Distance (Feet): 3 Feet Assistive device: 1 person hand held assist Gait Pattern/deviations: Step-to pattern Gait velocity: decreased   General Gait Details: Side steps to chair; provided HHA so that therapist could stay close to pt due to orthostatic hypotension; Min A of 2 to steady   Stairs             Wheelchair Mobility    Modified Rankin (Stroke Patients Only)       Balance Overall balance assessment: Needs assistance;History of Falls   Sitting balance-Leahy Scale: Fair Sitting balance - Comments: Able to sit EOB for at least 5 mins prior to getting OOB.  Did not require UE support but did provide close supervision due to orthostatic hypotension   Standing balance support: Single extremity supported;During functional activity Standing balance-Leahy Scale: Poor Standing balance comment: Requiring R UE support                            Cognition Arousal/Alertness: Awake/alert Behavior During Therapy:  Flat affect;Restless;Impulsive Overall Cognitive Status: Impaired/Different from baseline Area of Impairment: Safety/judgement;Awareness;Problem solving;Following commands;Memory;Orientation;Attention                 Orientation Level:  Disoriented to;Time;Situation Current Attention Level: Sustained Memory: Decreased recall of precautions;Decreased short-term memory Following Commands: Follows one step commands with increased time;Follows one step commands consistently Safety/Judgement: Decreased awareness of safety;Decreased awareness of deficits Awareness: Intellectual Problem Solving: Slow processing;Difficulty sequencing;Requires verbal cues;Requires tactile cues General Comments: Pt responding to most commands/questions appropriately.  Did need frequent cues to not push with L UE.      Exercises General Exercises - Lower Extremity Long Arc Quad: AROM;Strengthening;Both;Seated;10 reps (cues for full ROM) Hip Flexion/Marching: AROM;Strengthening;Both;Seated;10 reps    General Comments General comments (skin integrity, edema, etc.): Pt c/o itching all over - noted back with rash/bumps.  Pt sweating in bed.  Washed pt's back, changed gown, notified RN of itching.   Orthostatic BPs  Supine 120/88  Sitting 98/75  Sitting after 3 min 76/65 symptomatic; had pt perform LE AROM and R UE AROM exercises prior to standing  Standing  108/65 - unable to stand entire time, completed BP in sitting post tx to chair      Educated wife on orthostatic hypotension safety including sitting at EOB prior to standing, performing exercises prior to standing, having w/c next to bed so can just stand pivot.   Wife also reports if going home with hospice will not be able to get HHPT/OT.  Provided with LE HEP including LAQ, hip flexion, hip add pillow squeeze, and sit to stands.  OT has provided UE shoulder program.       Pertinent Vitals/Pain Pain Assessment: No/denies pain    Home Living                      Prior Function            PT Goals (current goals can now be found in the care plan section) Acute Rehab PT Goals Patient Stated Goal: wife reports plan to return home PT Goal Formulation: With patient Time For Goal  Achievement: 2020/06/22 Potential to Achieve Goals: Fair Progress towards PT goals: Progressing toward goals    Frequency    Min 3X/week      PT Plan Current plan remains appropriate    Co-evaluation PT/OT/SLP Co-Evaluation/Treatment: Yes Reason for Co-Treatment: Complexity of the patient's impairments (multi-system involvement);For patient/therapist safety PT goals addressed during session: Mobility/safety with mobility OT goals addressed during session: ADL's and self-care      AM-PAC PT "6 Clicks" Mobility   Outcome Measure  Help needed turning from your back to your side while in a flat bed without using bedrails?: A Little Help needed moving from lying on your back to sitting on the side of a flat bed without using bedrails?: A Little Help needed moving to and from a bed to a chair (including a wheelchair)?: A Little Help needed standing up from a chair using your arms (e.g., wheelchair or bedside chair)?: A Little Help needed to walk in hospital room?: A Little Help needed climbing 3-5 steps with a railing? : A Lot 6 Click Score: 17    End of Session Equipment Utilized During Treatment: Gait belt Activity Tolerance: Patient tolerated treatment well Patient left: with call bell/phone within reach;with family/visitor present;with chair alarm set;in chair Nurse Communication: Mobility status;Other (comment) (rash/itching; orthostatic) PT Visit Diagnosis: Other abnormalities of gait and mobility (R26.89);Other symptoms and signs involving the  nervous system (R29.898)     Time: 4967-5916 PT Time Calculation (min) (ACUTE ONLY): 42 min  Charges:  $Therapeutic Exercise: 8-22 mins $Therapeutic Activity: 8-22 mins                     Abran Richard, PT Acute Rehab Services Pager [phone] [facility] 05/20/2020, 12:26 PM

## 2020-05-20 NOTE — Care Management Important Message (Signed)
Important Message  Patient Details  Name: Eric George MRN: [id number] Date of Birth: 1930/05/21   Medicare Important Message Given:  Yes - Important Message mailed due to current National Emergency  Verbal consent obtained due to current National Emergency  Relationship to patient: Self Contact Name: Jaydrien Wassenaar Call Date: 05/20/20  Time: 1447 Phone: [phone] Outcome: No Answer/Busy Important Message mailed to: Patient address on file    Delorse Lek 05/20/2020, 2:48 PM

## 2020-05-20 NOTE — TOC Progression Note (Signed)
Transition of Care [facility]) - Progression Note    Patient Details  Name: Eric George MRN: [id number] Date of Birth: [birth year]  [facility] CM/SW Contact  Sharin Mons, RN Phone Number: 05/20/2020, 8:40 AM  Clinical Narrative:    NCM called wife to discuss no additional  bed offers noted for SNF/memory care unit. Wife place son [name] on call for NCM to share information. NCM made [name] aware of the only noted acceptance from SNF is [facility], [place]. [name] informed NCM they are now interested in pt d/c to home with hospice care. Preference: [facility]. NCM told [name] she would have the  liaison to call them to discuss hospice care and the support they could provide. Referral made to [place]...liaison to f/u with family.  [name] stated they have a meeting with [facility]/ palliative liaison this am..Marland KitchenNCM to f/u with family(wife/son).  TOC team monitoring and will continue to assist with TOC needs.....   Expected Discharge Plan: Home w Hospice Care Barriers to Discharge: No SNF bed  Expected Discharge Plan and Services Expected Discharge Plan: [place]   Discharge Planning Services: CM Consult     Expected Discharge Date: 05/16/20               DME Arranged: Wheelchair manual,3-N-1 DME Agency: [employer] Date DME Agency Contacted: 05/15/20 Time DME Agency Contacted: [phone] Representative spoke with at DME Agency: Darcel Bayley Arranged: RN,PT,OT,Nurse's Aide,Social Work [employer] Agency: [facility] Date [place]: 05/15/20 Time [place]: 1504 Representative spoke with at [place]: [place] Determinants of Health ([place]) Interventions    Readmission Risk Interventions No flowsheet data found.
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
## 2020-05-20 NOTE — Patient Outreach (Signed)
[facility]) Care Management  05/20/2020  Eric George 07-11-1930 [id number]   CSW made contact with pt's wife today by phone. Pt's wife tells me the pt has been hospitalized since Friday, 05/10/2020.  "He was having a seizure". Pt's wife shared that upon arrival to ED they discovered he had a shoulder dislocation/fracture. Pt had a total shoulder replacement and remains in hospital as they determine the appropriate dc plan for him.  CSW listened and offered support to pt's wife. "I have been here at this hospital for 10 days". Her sons (one loca and one here from [place]) are providing her support and guidance as well.   CSW will touch base with pt/wife on Thursday for updates on dc plans and further needs.   Eduard Clos, MSW, [place] Worker  [place] [phone]
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
## 2020-05-20 NOTE — Progress Notes (Signed)
Occupational Therapy Treatment Patient Details Name: Eric George MRN: [id number] DOB: 1930/11/30 Today's Date: 05/20/2020    History of present illness Pt is 87 y.o. male with TBI 1990, stroke 2013, HTN, HLD, prostate cancer, GERD, SBO, short-term memory loss presented to [facility] Emergency Department via EMS after having a seizure. He was also found to have a left shoulder fracture dislocation. Underwent L Reverse TSA 1/8. Acute metabolic encephalopathy/delirium and disorientation requiring medicaiton due to agitation 1/12. Pt also with orthostatic hypotension.   OT comments  Pt steadily progressing with OT and no agitation noted, but itching behavior appears to be anxiety per spouse. Pt performing bed mobility with minA and to watch LUE from pushing off of bed. Pt currently minA for donning/doffing brace and gown. Pt currently modA+2 for sit to stand  And minA +2 for taking a few steps from bed to recliner. Pt + orthostatic hypotension today with transitional movement. BP:120/88  Supine; 98/75 sitting; 76/65 sitting after 3 mins; 108/65 in recliner sitting after standing. Pt performed LUE HEP wlbow through digits AROM. Pt would benefit from continued OT skilled services for ADL, mobility and safety in [place] setting. OT following acutely. (pt's family has elected hospice in home at d/c so pt's family would like to continue therapy in house as they may lose therapy services at home.)     Follow Up Recommendations  Home health OT;Supervision/Assistance - 24 hour (if possible)    Equipment Recommendations  3 in 1 bedside commode    Recommendations for Other Services      Precautions / Restrictions Precautions Precautions: Fall;Shoulder Type of Shoulder Precautions: no ROM shoulder; elbow/wrist/digits ROM OK Shoulder Interventions: Shoulder sling/immobilizer;At all times;Off for dressing/bathing/exercises Precaution Booklet Issued: Yes (comment) Precaution Comments: Per Ortho notes  starting on 1/10: he can use the LUE to use a RW Required Braces or Orthoses: Sling Restrictions LUE Weight Bearing: Non weight bearing       Mobility Bed Mobility Overal bed mobility: Needs Assistance Bed Mobility: Rolling;Sidelying to Sit Rolling: Supervision Sidelying to sit: Min assist       General bed mobility comments: guidance of trunk elevation as BLEs sweep to EOB  Transfers Overall transfer level: Needs assistance Equipment used: 1 person hand held assist Transfers: Sit to/from Stand Sit to Stand: Mod assist;+2 safety/equipment Stand pivot transfers: Min assist;+2 safety/equipment       General transfer comment: Required mod A to stand from low bed but did have assist of 2 for safety due to hx of orthostatic hypotension and confusion/varied participation hx;  Min A x 2 to pivot toward chair.    Balance Overall balance assessment: Needs assistance;History of Falls   Sitting balance-Leahy Scale: Fair Sitting balance - Comments: sitting EOB x5 mins, + orthostatic hypotension.   Standing balance support: Single extremity supported;During functional activity Standing balance-Leahy Scale: Poor Standing balance comment: Requiring R UE support                           ADL either performed or assessed with clinical judgement   ADL Overall ADL's : Needs assistance/impaired Eating/Feeding: Set up;Supervision/ safety;Sitting               Upper Body Dressing : Sitting;With caregiver independent assisting;Minimal assistance Upper Body Dressing Details (indicate cue type and reason): donning and doffing gown and brace     Toilet Transfer: Moderate assistance;Total assistance;+2 for physical assistance;+2 for safety/equipment;Stand-pivot Toilet Transfer Details (indicate cue type  and reason): +2 simulation to commode using recliner         Functional mobility during ADLs: Minimal assistance;+2 for physical assistance;+2 for safety/equipment;Cueing  for safety;Cueing for sequencing General ADL Comments: Pt's wife present for session; Pt performing transfers. ADL functional mobility from bed to recliner; HEP for elbow of LUE through digits. Pt continues to be nearly maxA for ADL due to memory deficits and old TBI.     Vision       Perception     Praxis      Cognition Arousal/Alertness: Awake/alert Behavior During Therapy: Flat affect;Restless;Impulsive Overall Cognitive Status: Impaired/Different from baseline Area of Impairment: Safety/judgement;Awareness;Problem solving;Following commands;Memory;Orientation;Attention                 Orientation Level: Disoriented to;Time;Situation Current Attention Level: Sustained Memory: Decreased recall of precautions;Decreased short-term memory Following Commands: Follows one step commands with increased time;Follows one step commands consistently Safety/Judgement: Decreased awareness of safety;Decreased awareness of deficits Awareness: Intellectual Problem Solving: Slow processing;Difficulty sequencing;Requires verbal cues;Requires tactile cues General Comments: Pt requiring cues to abide by NWB LUE; 1 step commands usually followed. Per spouse itching head is a sign of pt getting anxious.        Exercises Exercises: General Upper Extremity General Exercises - Upper Extremity Elbow Flexion: AROM;Left;10 reps;Seated Elbow Extension: AROM;Left;10 reps;Seated Wrist Flexion: AROM;Strengthening;Left;10 reps;Supine Wrist Extension: AROM;Strengthening;Left;10 reps;Supine Digit Composite Flexion: AROM;Strengthening;Left;10 reps;Supine Composite Extension: AROM;Strengthening;Left;10 reps;Supine General Exercises - Lower Extremity Long Arc Quad: AROM;Strengthening;Both;Seated;10 reps (cues for full ROM) Hip Flexion/Marching: AROM;Strengthening;Both;Seated;10 reps   Shoulder Instructions Shoulder Instructions Donning/doffing shirt without moving shoulder: Moderate assistance;Caregiver  independent with task Donning/doffing sling/immobilizer: Caregiver independent with task ROM for elbow, wrist and digits of operated UE: Minimal assistance     General Comments Pt's spouse in room; pt itching neck and back; areas washed and new gown applied. Pt's RN aware of need for benadryl. BP supine 120/88; 98/75 sitting EOB; 76/65 sitting x3 mins; 108/65 after exertion of standing, but unable to collect BP in standing- pt required to sit down.    Pertinent Vitals/ Pain       Pain Assessment: Faces Faces Pain Scale: Hurts a little bit Pain Location: L shoulder Pain Descriptors / Indicators: Grimacing;Restless;Operative site guarding Pain Intervention(s): Monitored during session;Premedicated before session;Repositioned  Home Living                                          Prior Functioning/Environment              Frequency  Min 2X/week        Progress Toward Goals  OT Goals(current goals can now be found in the care plan section)  Progress towards OT goals: Progressing toward goals  Acute Rehab OT Goals Patient Stated Goal: wife reports plan to return home OT Goal Formulation: With patient/family Time For Goal Achievement: 06/02/20 Potential to Achieve Goals: Good ADL Goals Pt Will Perform Upper Body Bathing: with min assist;with caregiver independent in assisting;sitting Pt Will Perform Upper Body Dressing: with min assist;with caregiver independent in assisting;sitting Pt Will Transfer to Toilet: with min assist;bedside commode;ambulating Pt/caregiver will Perform Home Exercise Program: Left upper extremity;With minimal assist;Increased ROM;With written HEP provided Additional ADL Goal #1: Family will independently manage sling and WBS per precautions  Plan Discharge plan needs to be updated;Frequency needs to be updated    Co-evaluation    PT/OT/SLP  Co-Evaluation/Treatment: Yes Reason for Co-Treatment: Complexity of the patient's  impairments (multi-system involvement);For patient/therapist safety PT goals addressed during session: Mobility/safety with mobility OT goals addressed during session: ADL's and self-care;Strengthening/ROM      AM-PAC OT "6 Clicks" Daily Activity     Outcome Measure   Help from another person eating meals?: A Little Help from another person taking care of personal grooming?: A Little Help from another person toileting, which includes using toliet, bedpan, or urinal?: Total Help from another person bathing (including washing, rinsing, drying)?: A Lot Help from another person to put on and taking off regular upper body clothing?: A Little Help from another person to put on and taking off regular lower body clothing?: A Lot 6 Click Score: 14    End of Session Equipment Utilized During Treatment: Gait belt  OT Visit Diagnosis: Unsteadiness on feet (R26.81);Other abnormalities of gait and mobility (R26.89);Muscle weakness (generalized) (M62.81);History of falling (Z91.81);Other symptoms and signs involving cognitive function;Pain Pain - Right/Left: Left Pain - part of body: Shoulder   Activity Tolerance Patient tolerated treatment well   Patient Left in chair;with call bell/phone within reach;with chair alarm set;with family/visitor present   Nurse Communication Mobility status;Weight bearing status;Other (comment) (need for treatment for rash/itching on back)        Time: 9326-7124 OT Time Calculation (min): 31 min  Charges: OT General Charges $OT Visit: 1 Visit OT Treatments $Self Care/Home Management : 8-22 mins  Jefferey Pica, OTR/L Acute Rehabilitation Services Pager: [phone] Office: [phone]    Trace Cederberg C 05/20/2020, 2:11 PM

## 2020-05-21 DIAGNOSIS — G9341 Metabolic encephalopathy: Secondary | ICD-10-CM

## 2020-05-21 DIAGNOSIS — Z66 Do not resuscitate: Secondary | ICD-10-CM

## 2020-05-21 DIAGNOSIS — Z7189 Other specified counseling: Secondary | ICD-10-CM

## 2020-05-21 MED ORDER — POLYVINYL ALCOHOL 1.4 % OP SOLN: 1.0000 [drp] | Freq: Four times a day (QID) | OPHTHALMIC | Status: DC | PRN

## 2020-05-21 MED ORDER — GLYCOPYRROLATE 0.2 MG/ML IJ SOLN
0.2000 mg | INTRAMUSCULAR | Status: DC | PRN
Start: 2020-05-21 — End: 2020-05-22
  Filled 2020-05-21: qty 1

## 2020-05-21 MED ORDER — BIOTENE DRY MOUTH MT LIQD: 15.0000 mL | OROMUCOSAL | Status: DC | PRN

## 2020-05-21 MED ORDER — DOCUSATE SODIUM 100 MG PO CAPS: 100.0000 mg | ORAL_CAPSULE | Freq: Two times a day (BID) | ORAL | Status: DC | PRN

## 2020-05-21 NOTE — TOC Progression Note (Signed)
Transition of Care [facility]) - Progression Note    Patient Details  Name: Eric George MRN: [id number] Date of Birth: 11-17-1930  [facility]) CM/SW Contact  Sharin Mons, RN Phone Number: 05/21/2020, 4:38 PM  Clinical Narrative:     Per [facility] approval for residential hospice received...awaitng bed availability. Hoping bed to present on 05/22/2020. TOC team will continue to monitor and follow.  Expected Discharge Plan: Hospice Medical Facility Barriers to Discharge: Other (comment) (no bed available)  Expected Discharge Plan and Services Expected Discharge Plan: [place]   Discharge Planning Services: CM Consult     Expected Discharge Date: 05/16/20               DME Arranged: Wheelchair manual,3-N-1 DME Agency: [employer] Date DME Agency Contacted: 05/15/20 Time DME Agency Contacted: [phone] Representative spoke with at DME Agency: Darcel Bayley Arranged: RN,PT,OT,Nurse's Aide,Social Work [employer] Agency: [employer] Date [place]: 05/15/20 Time [place]: 1504 Representative spoke with at [place]: [place] Determinants of Health ([place]) Interventions    Readmission Risk Interventions No flowsheet data found.

## 2020-05-21 NOTE — Plan of Care (Signed)
Patient is on comfort care measures at this time. VMT in room and I am sitting near patient's room for easy access. He is a little restless tonight but not agitated. He keeps calling for his wife [name] but can easily be back reoriented. He has periods of rest at times. Ate a chocolate ice cream cup and drank some apple juice tonight. Still has not voided. Will continue to monitor and continue current POC.
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
## 2020-05-21 NOTE — Plan of Care (Signed)
Patient is now DNR status and family would like for him to go home with hospice versus going to SNF. [name] (son) was going to take patient's wife home tonight and was hoping to have a sitter for tonight but I made them aware that we are really short staffed and we do not have anyone that will sit with him. Wife agreed to stay with patient one more night. Patient is currently resting in bed at this time - gave scheduled Ativan 0.5 mg PO and Zyprexa 5 mg before bed this evening and that has seemed to help. VMT came to set up video sitter but the wife refused since she is staying over. Wife is getting a little rest also. NAD or needs voiced from patient. Vital signs WNL. Bed alarm on and in place due to fall precautions. Sitting by the patient's room to remain close. Will continue to monitor and continue current POC.

## 2020-05-21 NOTE — Progress Notes (Signed)
   Daily Progress Note   Patient Name: Eric George       Date: 05/21/2020 DOB: Feb 15, 1931  Age: 87 y.o. MRN#: [id number] Attending Physician: Aline August, MD Primary Care Physician: Eulas Post, MD Admit Date: 05/11/2020  Reason for Consultation/Follow-up: Establishing goals of care, Non pain symptom management, Pain control, Psychosocial/spiritual support and Terminal Care  Chart Reviewed and Updates Received.   Patient resting comfortably. Decreased agitation. Family is at the bedside.   I spoke at length with patient's wife and son [name]. Updates provided with recommendations presented for consideration of hospice home facility vs home with family and hospice. Patient requiring aggressive symptom management and care. Family verbalized understanding expressing fatigue emotionally and physically.   Education provided on comfort care while hospitalized and hospice's goals and philosophy of care. Family aware if patient is transferred to hospice home care would continue at their facility focusing on comfort and symptom management. Family verbalized understanding.   Family planning to have meeting with Cheri, RN ([facility]) this morning and further discuss hospice home and transfer process.   1640: Family has met with hospice. Per Cheri, RN patient has been accepted however, no bed availability at this time. Spoke with wife and offered support. We again dicussed comfort care measures at length and expectations during EOL. She verbalized understanding. Expressed family's appreciation of all care. Wife is planning to go home tonight and get some rest. RN updated.   Length of Stay: 10 days  Vital Signs: BP 120/84 (BP Location: Right Arm)   Pulse (!) 104   Temp 97.6 F (36.4 C) (Oral)   Resp 18   Ht _0  (1.88 m)   Wt 79 kg   SpO2 94%   BMI 22.36 kg/m  SpO2: SpO2: 94 % O2 Device: O2 Device: Room Air O2 Flow Rate: O2 Flow Rate (L/min): 2 L/min  Palliative Care  Assessment & Plan   Code Status:  DNR  Goals of Care/Recommendations:  All care to focus on comfort.   Pending transfer to [facility] once bed is available.   Family confirms wishes to focus on comfort/symptom management.  Will continue Oxy IR as prescribed Robinul PRN for excessive secretions Continue Ativan PRN for agitation/anxiety Zofran PRN for nausea Liquifilm tears PRN for dry eyes Continue Haldol PRN for agitation/anxiety May have comfort feeding Comfort cart for family Unrestricted visitations in the setting of EOL (per policy) Oxygen PRN 2L or less for comfort. No escalation.   PMT will continue to support and follow as needed.   Prognosis: POOR (weeks-days) in the setting of rapid decline and care focused on comfort.   Discharge Planning: Hospice facility  Thank you for allowing the Palliative Medicine Team to assist in the care of this patient.  Time Total: 50 min.   Visit consisted of counseling and education dealing with the complex and emotionally intense issues of symptom management and palliative care in the setting of serious and potentially life-threatening illness.Greater than 50%  of this time was spent counseling and coordinating care related to the above assessment and plan.  Alda Lea, AGPCNP-BC  Palliative Medicine Team [phone]

## 2020-05-21 NOTE — Progress Notes (Signed)
   Met with family this morning in pt's room. Pt was felt to be agitated with swing legs on and off the bed. He was not awake and did move from side to side in bed as well. He awoke one time and yelled out where is everybody. Present for the meeting both sons [name] and Nicki Reaper and the pt's wife [name]. Discussion yesterday consisted of pt going home with wife and I had ordered equipment to be delivered to the home for the pt's comfort. However today the wife feels she will not be able to handle him at home. She said that he was up and sown all night trying to get out of bed to go home etc. He was so confused she said. They enquire could he possibly be evaluated for hospice home in [facility]. I did look at the pt's chart after discussion hospice home philosophy and criteria. I spoke to our MD and the pt was approved to go to the Hospice home in [place]. I have updated the family and they have accepted the bed offer however we do not have a bed today to offer.   We will continue to follow and proceed with helping d/c plan as needed once bed available.  I have cancelled the DME order for delivery to the home. Webb Silversmith RN [phone]

## 2020-05-22 MED ORDER — POLYETHYLENE GLYCOL 3350 17 G PO PACK: 17.0000 g | PACK | Freq: Every day | ORAL | Status: DC | PRN

## 2020-05-22 NOTE — Plan of Care (Signed)
  Problem: Health Behavior/Discharge Planning: Goal: Ability to manage health-related needs will improve Outcome: Adequate for Discharge   Problem: Clinical Measurements: Goal: Respiratory complications will improve Outcome: Adequate for Discharge   Problem: Activity: Goal: Risk for activity intolerance will decrease Outcome: Adequate for Discharge   Problem: Nutrition: Goal: Adequate nutrition will be maintained Outcome: Adequate for Discharge   Problem: Coping: Goal: Level of anxiety will decrease Outcome: Adequate for Discharge   Problem: Elimination: Goal: Will not experience complications related to bowel motility Outcome: Adequate for Discharge Goal: Will not experience complications related to urinary retention Outcome: Adequate for Discharge   Problem: Pain Managment: Goal: General experience of comfort will improve Outcome: Adequate for Discharge   Problem: Safety: Goal: Ability to remain free from injury will improve Outcome: Adequate for Discharge   Problem: Skin Integrity: Goal: Risk for impaired skin integrity will decrease Outcome: Adequate for Discharge   Problem: Acute Rehab PT Goals(only PT should resolve) Goal: Pt Will Go Supine/Side To Sit Outcome: Adequate for Discharge Goal: Pt Will Go Sit To Supine/Side Outcome: Adequate for Discharge Goal: Patient Will Transfer Sit To/From Stand Outcome: Adequate for Discharge Goal: Pt Will Ambulate Outcome: Adequate for Discharge   Problem: Acute Rehab OT Goals (only OT should resolve) Goal: Pt. Will Perform Upper Body Bathing Outcome: Adequate for Discharge Goal: Pt. Will Perform Upper Body Dressing Outcome: Adequate for Discharge Goal: Pt. Will Transfer To Toilet Outcome: Adequate for Discharge Goal: Pt/Caregiver Will Perform Home Exercise Program Outcome: Adequate for Discharge Goal: OT Additional ADL Goal #1 Outcome: Adequate for Discharge
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
## 2020-05-22 NOTE — TOC Transition Note (Signed)
Transition of Care [facility]) - CM/SW Discharge Note   Patient Details  Name: Eric George MRN: [id number] Date of Birth: 1930/08/02  [facility] CM/SW Contact:  Sharin Mons, RN Phone Number: 05/22/2020, 1:09 PM   Clinical Narrative:     Patient will DC to: [place] Anticipated DC date: 05/22/2020 Family notified: yes Transport by: Corey Harold   Per MD patient ready for DC today . RN, patient, patient's family, and facility notified of DC.  RN to call report prior to discharge [phone]). DC packet on chart. Ambulance transport requested for patient, time pick up for 2:30 pm.  RNCM will sign off for now as intervention is no longer needed. Please consult [place] again if new needs arise.  Final next level of care: [place] Barriers to Discharge: No Barriers Identified   Patient Goals and CMS Choice     Choice offered to / list presented to : Spouse  Discharge Placement                       Discharge Plan and Services   Discharge Planning Services: CM Consult            DME Arranged: Wheelchair manual,3-N-1 DME Agency: [employer] Date DME Agency Contacted: 05/15/20 Time DME Agency Contacted: [phone] Representative spoke with at DME Agency: Darcel Bayley Arranged: RN,PT,OT,Nurse's Aide,Social Work [employer] Agency: [facility] Date [place]: 05/15/20 Time [place]: 1504 Representative spoke with at [place]: [place] Determinants of Health (Miguel Barrera) Interventions     Readmission Risk Interventions No flowsheet data found.    -

## 2020-05-22 NOTE — Discharge Instructions (Signed)
Ophelia Charter MD, MPH Noemi Chapel, PA-C [place] [address], [address] [phone] (tel)   [phone] (fax)   POST-OPERATIVE INSTRUCTIONS - TOTAL SHOULDER REPLACEMENT    WOUND CARE ? KEEP THE INCISIONS CLEAN AND DRY. ? Leave steri-strips in place until they fall off on their own ? This is usually 2 weeks post-op ? Use the provided ice machine or Ice packs as often as possible for the first 3-4 days, then as needed for pain relief.  Keep a layer of cloth or a shirt between your skin and the cooling unit to prevent frost bite as it can get very cold.  SHOWERING: - You may shower on Post-Op Day #2. With assistance - You may remove the sling for showering, but keep a water resistant pillow under the arm to keep both the  elbow and shoulder away from the body (mimicking the abduction sling).  - Gently pat the area dry.  - Do not soak the shoulder in water. Do not go swimming in the pool or ocean until your sutures are removed. - KEEP THE INCISIONS CLEAN AND DRY.  EXERCISES ? Wear the sling at all times except when doing your exercises or using your walker ? You may remove the sling for showering, but keep the arm across the chest or in a secondary sling.    ? Accidental/Purposeful External Rotation and shoulder flexion (reaching behind you) is to be avoided at all costs for the first month. ? It is ok to come out of your sling if your are sitting and have assistance for eating.   ? Do not lift anything heavier than 1 pound until we discuss it further in clinic.   REGIONAL ANESTHESIA (NERVE BLOCKS)  The anesthesia team may have performed a nerve block for you if safe in the setting of your care.  This is a great tool used to minimize pain.  Typically the block may start wearing off overnight but the long acting medicine may last for 3-4 days.  The nerve block wearing off can be a challenging period but please utilize your as needed pain medications to try  and manage this period.    POST-OP MEDICATIONS- Multimodal approach to pain control  In general your pain will be controlled with a combination of substances.  Prescriptions unless otherwise discussed are electronically sent to your pharmacy.  This is a carefully made plan we use to minimize narcotic use.     ? Meloxicam OR Celebrex - Anti-inflammatory medication taken on a scheduled basis ? Acetaminophen - Non-narcotic pain medicine taken on a scheduled basis  ? Tramadol - This is a narcotic, to be used only on an as needed basis for pain. ? Aspirin - This medicine is used to minimize the risk of blood clots after surgery.  FOLLOW-UP ? If you develop a Fever (>101.5), Redness or Drainage from the surgical incision site, please call our office to arrange for an evaluation. ? Please call the office to schedule a follow-up appointment for a wound check, 7-10 days post-operatively.  IF YOU HAVE ANY QUESTIONS, PLEASE FEEL FREE TO CALL OUR OFFICE.  HELPFUL INFORMATION   If you had a block, it will wear off between 8-24 hrs postop typically.  This is period when your pain may go from nearly zero to the pain you would have had post-op without the block.  This is an abrupt transition but nothing dangerous is happening.  You may take an extra dose of narcotic  when this happens.  ? Your arm will be in a sling following surgery. You will be in this sling for the next 3-4 weeks.  I will let you know the exact duration at your follow-up visit.  ? You may be more comfortable sleeping in a semi-seated position the first few nights following surgery.  Keep a pillow propped under the elbow and forearm for comfort.  If you have a recliner type of chair it might be beneficial.  If not that is fine too, but it would be helpful to sleep propped up with pillows behind your operated shoulder as well under your elbow and forearm.  This will reduce pulling on the suture lines.  ? When dressing, put your operative  arm in the sleeve first.  When getting undressed, take your operative arm out last.  Loose fitting, button-down shirts are recommended.  ? In most states it is against the law to drive while your arm is in a sling. And certainly against the law to drive while taking narcotics.  ? You may return to work/school in the next couple of days when you feel up to it. Desk work and typing in the sling is fine.  ? We suggest you use the pain medication the first night prior to going to bed, in order to ease any pain when the anesthesia wears off. You should avoid taking pain medications on an empty stomach as it will make you nauseous.  ? Do not drink alcoholic beverages or take illicit drugs when taking pain medications.  ? Pain medication may make you constipated.  Below are a few solutions to try in this order: - Decrease the amount of pain medication if you arent having pain. - Drink lots of decaffeinated fluids. - Drink prune juice and/or each dried prunes  o If the first 3 dont work start with additional solutions - Take Colace - an over-the-counter stool softener - Take Senokot - an over-the-counter laxative - Take Miralax - a stronger over-the-counter laxative   Dental Antibiotics:  In most cases prophylactic antibiotics for Dental procdeures after total joint surgery are not necessary.  Exceptions are as follows:  1. History of prior total joint infection  2. Severely immunocompromised (Organ Transplant, cancer chemotherapy, Rheumatoid biologic meds such as Spearman)  3. Poorly controlled diabetes (A1C &gt; 8.0, blood glucose over 200)  If you have one of these conditions, contact your surgeon for an antibiotic prescription, prior to your dental procedure.

## 2020-05-22 NOTE — Progress Notes (Signed)
Report was called to [place]. Pt waiting for [facility] to transport. AVS was printed and given to patient and went over with him and his wife. Family had no further questions.

## 2020-05-23 ENCOUNTER — Ambulatory Visit: Payer: Self-pay | Admitting: *Deleted

## 2020-05-24 ENCOUNTER — Telehealth: Payer: Self-pay | Admitting: Family Medicine

## 2020-05-24 NOTE — Telephone Encounter (Signed)
Pts spouse is calling in to see if we can call the mail order pharmacy and ask that they stop all of the pts medication due to the facility that he is in will be providing him with his medications.    Spouse stated that she will try to call the pharmacy but not sure if the pharmacy will cancel it coming from her.  [facility] [place] [address], [place], [place] [address]  [phone] (P)    Pt was in [place] for 11 days (05/10/2020-05/22/2020) pt was moved to [facility] on Wednesday 05/22/2020.

## 2020-05-24 NOTE — Telephone Encounter (Signed)
Attempted to reach pharmacy using number on file but unable to speak to someone.  Spoke with wife and she says she was able to get the medication put on hold.  Advised if she needed anything else to let us know.

## 2020-05-29 ENCOUNTER — Other Ambulatory Visit: Payer: Self-pay | Admitting: *Deleted

## 2020-05-29 NOTE — Patient Outreach (Signed)
[facility]) Care Management  05/29/2020  Eric George 31-May-1930 [id number]   CSW made contact with pt's wife today who shared that pt passed away over the weekend at [facility] home in [place].  'He was ready to go and had been for some time".  CSW offered condolences and allowed pt's wife to share about the past 2 weeks. "I am comforted to know that we did what he wanted and that he is not suffering".  CSW offered emotional support and suggested that she allow her self time to rest and grieve and to begin to think of ways to allow herself to enjoy life and to find ways to practice self care.  " I am looking forward to going back to my ladies lunches on Tuesdays".  CSW advised wife to call if needs, resources for her self arise.  CSW will sign off and advise [facility] team and PCP.  Eduard Clos, MSW, [place] Worker  [place] [phone]

## 2020-06-04 DEATH — deceased
